# Patient Record
Sex: Male | Born: 1975 | Race: White | Hispanic: No | Marital: Single | Smoking: Current every day smoker
Health system: Southern US, Community
[De-identification: ages and names within clinical notes are randomized; demographics above are authoritative.]

## PROBLEM LIST (undated history)

## (undated) DIAGNOSIS — F319 Bipolar disorder, unspecified: Secondary | ICD-10-CM

## (undated) DIAGNOSIS — E669 Obesity, unspecified: Secondary | ICD-10-CM

## (undated) DIAGNOSIS — G629 Polyneuropathy, unspecified: Secondary | ICD-10-CM

## (undated) DIAGNOSIS — F603 Borderline personality disorder: Secondary | ICD-10-CM

## (undated) DIAGNOSIS — Z7289 Other problems related to lifestyle: Secondary | ICD-10-CM

## (undated) DIAGNOSIS — M5136 Other intervertebral disc degeneration, lumbar region: Secondary | ICD-10-CM

## (undated) DIAGNOSIS — J449 Chronic obstructive pulmonary disease, unspecified: Secondary | ICD-10-CM

## (undated) DIAGNOSIS — K219 Gastro-esophageal reflux disease without esophagitis: Secondary | ICD-10-CM

## (undated) DIAGNOSIS — F329 Major depressive disorder, single episode, unspecified: Secondary | ICD-10-CM

## (undated) DIAGNOSIS — K0889 Other specified disorders of teeth and supporting structures: Secondary | ICD-10-CM

## (undated) DIAGNOSIS — M869 Osteomyelitis, unspecified: Secondary | ICD-10-CM

## (undated) DIAGNOSIS — L97519 Non-pressure chronic ulcer of other part of right foot with unspecified severity: Secondary | ICD-10-CM

## (undated) DIAGNOSIS — F410 Panic disorder [episodic paroxysmal anxiety] without agoraphobia: Secondary | ICD-10-CM

## (undated) DIAGNOSIS — M51369 Other intervertebral disc degeneration, lumbar region without mention of lumbar back pain or lower extremity pain: Secondary | ICD-10-CM

## (undated) DIAGNOSIS — Z87898 Personal history of other specified conditions: Secondary | ICD-10-CM

## (undated) DIAGNOSIS — M1990 Unspecified osteoarthritis, unspecified site: Secondary | ICD-10-CM

## (undated) DIAGNOSIS — L98499 Non-pressure chronic ulcer of skin of other sites with unspecified severity: Secondary | ICD-10-CM

## (undated) DIAGNOSIS — F32A Depression, unspecified: Secondary | ICD-10-CM

## (undated) HISTORY — PX: AMPUTATION: SHX166

## (undated) HISTORY — PX: INNER EAR SURGERY: SHX679

---

## 2000-12-07 ENCOUNTER — Emergency Department (HOSPITAL_COMMUNITY): Admission: EM | Admit: 2000-12-07 | Discharge: 2000-12-07 | Payer: Self-pay | Admitting: Emergency Medicine

## 2000-12-09 ENCOUNTER — Emergency Department (HOSPITAL_COMMUNITY): Admission: EM | Admit: 2000-12-09 | Discharge: 2000-12-09 | Payer: Self-pay | Admitting: Emergency Medicine

## 2002-05-03 ENCOUNTER — Encounter: Payer: Self-pay | Admitting: Emergency Medicine

## 2002-05-03 ENCOUNTER — Emergency Department (HOSPITAL_COMMUNITY): Admission: EM | Admit: 2002-05-03 | Discharge: 2002-05-03 | Payer: Self-pay | Admitting: Emergency Medicine

## 2004-04-12 ENCOUNTER — Emergency Department (HOSPITAL_COMMUNITY): Admission: EM | Admit: 2004-04-12 | Discharge: 2004-04-12 | Payer: Self-pay | Admitting: Emergency Medicine

## 2005-12-01 ENCOUNTER — Emergency Department (HOSPITAL_COMMUNITY): Admission: EM | Admit: 2005-12-01 | Discharge: 2005-12-01 | Payer: Self-pay | Admitting: *Deleted

## 2006-11-20 ENCOUNTER — Emergency Department (HOSPITAL_COMMUNITY): Admission: EM | Admit: 2006-11-20 | Discharge: 2006-11-20 | Payer: Self-pay | Admitting: Emergency Medicine

## 2007-04-15 ENCOUNTER — Emergency Department (HOSPITAL_COMMUNITY): Admission: EM | Admit: 2007-04-15 | Discharge: 2007-04-15 | Payer: Self-pay | Admitting: Emergency Medicine

## 2007-11-07 ENCOUNTER — Ambulatory Visit (HOSPITAL_COMMUNITY): Admission: RE | Admit: 2007-11-07 | Discharge: 2007-11-07 | Payer: Self-pay | Admitting: Family Medicine

## 2008-08-03 ENCOUNTER — Emergency Department (HOSPITAL_COMMUNITY): Admission: EM | Admit: 2008-08-03 | Discharge: 2008-08-03 | Payer: Self-pay | Admitting: Emergency Medicine

## 2008-08-09 ENCOUNTER — Ambulatory Visit (HOSPITAL_COMMUNITY): Admission: RE | Admit: 2008-08-09 | Discharge: 2008-08-09 | Payer: Self-pay | Admitting: Emergency Medicine

## 2008-08-30 ENCOUNTER — Inpatient Hospital Stay (HOSPITAL_COMMUNITY): Admission: EM | Admit: 2008-08-30 | Discharge: 2008-08-31 | Payer: Self-pay | Admitting: Emergency Medicine

## 2009-04-12 ENCOUNTER — Inpatient Hospital Stay (HOSPITAL_COMMUNITY): Admission: EM | Admit: 2009-04-12 | Discharge: 2009-04-14 | Payer: Self-pay | Admitting: Emergency Medicine

## 2009-04-13 ENCOUNTER — Encounter (INDEPENDENT_AMBULATORY_CARE_PROVIDER_SITE_OTHER): Payer: Self-pay | Admitting: General Surgery

## 2011-01-15 ENCOUNTER — Encounter: Payer: Self-pay | Admitting: Emergency Medicine

## 2011-04-04 LAB — CULTURE, BLOOD (ROUTINE X 2)
Culture: NO GROWTH
Report Status: 4252010
Report Status: 4252010

## 2011-04-04 LAB — DIFFERENTIAL
Basophils Absolute: 0 10*3/uL (ref 0.0–0.1)
Basophils Absolute: 0.1 10*3/uL (ref 0.0–0.1)
Basophils Absolute: 0.1 10*3/uL (ref 0.0–0.1)
Basophils Relative: 0 % (ref 0–1)
Basophils Relative: 1 % (ref 0–1)
Eosinophils Absolute: 0.2 10*3/uL (ref 0.0–0.7)
Eosinophils Absolute: 0.2 10*3/uL (ref 0.0–0.7)
Eosinophils Relative: 1 % (ref 0–5)
Eosinophils Relative: 2 % (ref 0–5)
Eosinophils Relative: 3 % (ref 0–5)
Lymphocytes Relative: 24 % (ref 12–46)
Lymphocytes Relative: 25 % (ref 12–46)
Lymphocytes Relative: 31 % (ref 12–46)
Lymphs Abs: 3 10*3/uL (ref 0.7–4.0)
Lymphs Abs: 3.2 10*3/uL (ref 0.7–4.0)
Lymphs Abs: 3.3 10*3/uL (ref 0.7–4.0)
Monocytes Absolute: 2 10*3/uL — ABNORMAL HIGH (ref 0.1–1.0)
Monocytes Absolute: 2 10*3/uL — ABNORMAL HIGH (ref 0.1–1.0)
Monocytes Relative: 15 % — ABNORMAL HIGH (ref 3–12)
Monocytes Relative: 16 % — ABNORMAL HIGH (ref 3–12)
Neutro Abs: 5.8 10*3/uL (ref 1.7–7.7)
Neutro Abs: 7.5 10*3/uL (ref 1.7–7.7)
Neutro Abs: 7.8 10*3/uL — ABNORMAL HIGH (ref 1.7–7.7)
Neutrophils Relative %: 57 % (ref 43–77)
Neutrophils Relative %: 58 % (ref 43–77)
Neutrophils Relative %: 59 % (ref 43–77)

## 2011-04-04 LAB — BASIC METABOLIC PANEL
BUN: 7 mg/dL (ref 6–23)
BUN: 7 mg/dL (ref 6–23)
CO2: 26 mEq/L (ref 19–32)
CO2: 26 mEq/L (ref 19–32)
Calcium: 9 mg/dL (ref 8.4–10.5)
Calcium: 9 mg/dL (ref 8.4–10.5)
Chloride: 102 mEq/L (ref 96–112)
Chloride: 105 mEq/L (ref 96–112)
Creatinine, Ser: 0.78 mg/dL (ref 0.4–1.5)
Creatinine, Ser: 0.85 mg/dL (ref 0.4–1.5)
GFR calc Af Amer: >60 mL/min (ref >60)
GFR calc Af Amer: >60 mL/min (ref >60)
GFR calc non Af Amer: >60 mL/min (ref >60)
GFR calc non Af Amer: >60 mL/min (ref >60)
Glucose, Bld: 86 mg/dL (ref 70–99)
Glucose, Bld: 94 mg/dL (ref 70–99)
Potassium: 4.1 mEq/L (ref 3.5–5.1)
Potassium: 4.5 mEq/L (ref 3.5–5.1)
Sodium: 138 mEq/L (ref 135–145)
Sodium: 140 mEq/L (ref 135–145)

## 2011-04-04 LAB — WOUND CULTURE

## 2011-04-04 LAB — CBC
HCT: 43.7 % (ref 39.0–52.0)
HCT: 44.9 % (ref 39.0–52.0)
Hemoglobin: 15.1 g/dL (ref 13.0–17.0)
Hemoglobin: 15.5 g/dL (ref 13.0–17.0)
MCHC: 34.6 g/dL (ref 30.0–36.0)
MCHC: 34.6 g/dL (ref 30.0–36.0)
MCV: 88.5 fL (ref 78.0–100.0)
MCV: 88.6 fL (ref 78.0–100.0)
Platelets: 210 10*3/uL (ref 150–400)
Platelets: 215 10*3/uL (ref 150–400)
Platelets: 219 10*3/uL (ref 150–400)
RBC: 4.93 MIL/uL (ref 4.22–5.81)
RBC: 5.07 MIL/uL (ref 4.22–5.81)
RDW: 13.2 % (ref 11.5–15.5)
RDW: 13.4 % (ref 11.5–15.5)
RDW: 13.5 % (ref 11.5–15.5)
WBC: 10.2 10*3/uL (ref 4.0–10.5)
WBC: 12.9 10*3/uL — ABNORMAL HIGH (ref 4.0–10.5)
WBC: 13.4 10*3/uL — ABNORMAL HIGH (ref 4.0–10.5)

## 2011-05-08 NOTE — H&P (Signed)
Hunter Reyes, Hunter Reyes             ACCOUNT NO.:  [id number]   MEDICAL RECORD NO.:  [id number]          PATIENT TYPE:  INP   LOCATION:  [id number]                          FACILITY:  [facility]   PHYSICIAN:  Anselmo Pickler, DO    DATE OF BIRTH:  1976/02/06   DATE OF ADMISSION:  08/30/2008  DATE OF DISCHARGE:  LH                              HISTORY & PHYSICAL   PRIMARY CARE PHYSICIAN:  [facility].   CHIEF COMPLAINT:  Breast abscess.   HISTORY OF PRESENT ILLNESS:  The patient is a 35 year old Caucasian male  who presented to [facility] about 5 weeks ago with an abscess that he  found on his right breast.  He had an ultrasound done by the radiologist  on August 17 in which they drew some fluid off of the ultrasound in  which they found a 2 x 3 x 2 cm abscess in the right subareolar region  with subsequent ultrasound guided aspiration, but no apparent  complications.  The patient was then sent home on Keflex for 14 days and  recommended to follow up in 2 weeks.  He was recommended for an  ultrasound in 2 weeks at that point in time.  The patient stated that he  was started originally on doxycycline before Dr. Camila Li actually changed  his medication.  He stated that he did have some resolution for the last  few weeks, but it began to worsen about 4 days ago where he started to  have more tenderness and redness of this abscess and also started having  fatigue, chills and malaise.   PAST MEDICAL HISTORY:  Significant for chronic back pain and depression.   SOCIAL HISTORY:  He is an occasional drinker, smoker and does chewing  tobacco.   ALLERGIES:  He has no known drug allergies.   MEDICATIONS:  1. Abilify 5 mg at night.  2. Celexa 40 mg at night.  3. Meloxicam 50 mg twice a day.  4. Tramadol acetaminophen 50 mg t.i.d.   REVIEW OF SYSTEMS:  Constitutional:  Positive for fever, chills and  fatigue.  EYES:  Negative for eye pain or changes in vision.  EARS,  NOSE, MOUTH  AND THROAT: All negative.  CARDIOVASCULAR:  Negative for  chest pain or palpitations as per negative shortness of breath or  wheezing.  GI:  Negative for abdominal pain.  No nausea, vomiting,  diarrhea or constipation.  GU:  Negative for hesitancy or dysuria.  MUSCULOSKELETAL:  Negative for  arthralgias, arthritis.  SKIN:  Positive for abscesses of the right  breast.  NEUROLOGICAL:  Negative for syncopal episodes.  PSYCHIATRIC:  Positive for depression.  METABOLIC:  Negative.  HEMATOLOGIC:  All  negative.   PHYSICAL EXAMINATION:  VITAL SIGNS:  Temperature 97.6, pulse 71,  respirations 20, blood pressure 103/58.  GENERAL:  The patient is an obese, well-nourished, well-hydrated 7-year-  old Caucasian male.  Head is normocephalic, atraumatic.  Eyes are EOMI.  Conjunctive there is no scleral icterus or conjunctival injection.  Ears, Nose, Mouth and Throat:  Nose is turbinates are moist.  Throat is  clear.  Normal voice.  There is no erythema noted.  NECK:  Supple.  No lymphadenopathy noted.  CARDIOVASCULAR:  Regular rate and rhythm.  No murmurs, rubs or gallops.  RESPIRATORY:  Positive expiratory rhonchi noted intermittently  throughout.  CHEST:  The patient has a moderate to large abscess cellulitis in the  area of the areola of the right breast.  There is no drainage at this  point in time.  It is tender.  ABDOMEN:  Obese, soft, nontender, nondistended.  Bowel sounds present  all for quadrants.  EXTREMITIES:  Full range of motion.  Cranial nerves II-XII grossly  intact.  NEUROLOGICAL:  Patient is A&O x3.  LYMPH NODES:  No palpable or lymph nodes noted in the axillary region,   LABORATORY DATA:  Labs that were done include white count 15.7,  hemoglobin 15.7, hematocrit 45.7, platelet count of 264.  Sodium 135,  potassium 4.0, chloride 101, CO2 28, glucose 85, BUN 8 and creatinine  0.87, blood cultures were obtained as well, and there is no record of  any fluid that was obtained from  his previous admission.   ASSESSMENT/PLAN:  Abscess of the right breast.  Will admit the patient  to the service of [facility].  Will start him on vancomycin 1 gram every  12 hours.  Also, will place him on a regular diet.  Will do DVT and GI  prophylaxis.  Will have surgery come and see him as well and we will  obtain an ultrasound since it was recommended for repeat ultrasound of  right breast.  Will go ahead and get that while he is here as well.  Will place the patient on pain medication and continue to monitor him.  Will also have case management come see him for possible IV home  therapy.      Anselmo Pickler, DO  Electronically Signed     CB/MEDQ  D:  08/30/2008  T:  08/31/2008  Job:  [id number]

## 2011-05-08 NOTE — Discharge Summary (Signed)
Hunter Reyes, Hunter Reyes             ACCOUNT NO.:  [id number]   MEDICAL RECORD NO.:  [id number]          PATIENT TYPE:  INP   LOCATION:  A312                          FACILITY:  [facility]   PHYSICIAN:  Salem Caster, DO    DATE OF BIRTH:  [birth year]   DATE OF ADMISSION:  08/30/2008  DATE OF DISCHARGE:  09/08/2009LH                               DISCHARGE SUMMARY   DISCHARGE DIAGNOSES:  1. Breast abscess.  2. Leukocytosis.  3. History of chronic back pain.  4. History of depression.   PRIMARY CARE PHYSICIAN:  [facility].  General  surgeon was Dr. Jamesetta So.   BRIEF HOSPITAL COURSE:  This is a 35 year old Caucasian male who  presented to [facility] 5 weeks prior for abscess on his right breast.  He had ultrasound performed, in which they drew off a fluid.  An  ultrasound showed 2- x 3- x 2-cm abscess in his right subareolar region.  Subsequent ultrasound was uncomplicated.  The patient was sent home on  antibiotic, and antibiotic was switched to Keflex apparently.  The  patient was told to follow up in approximately 2 weeks.  The patient was  originally on doxycycline and this medication was changed to Keflex.  The patient did have some resolution for a few weeks, began worsening,  and approximately 4 days prior he had more tenderness and redness.  He  started to have fatigue, chills, and malaise.  The patient presented  here to the hospital with these complaints.  He was admitted and he was  started on IV antibiotics.  The patient placed on DVT as well as GI  prophylaxis.  Surgery was consulted and they performed some drainage on  his abscess, which seemed to improve his abscess at this time.  He has  decreased redness and swelling, and the patient is suggesting that he  wants to go home.  We spoke with Surgery and General Surgery and states  that the patient can go home on p.o. antibiotics with followup in 1 week  at his office.   MEDICATIONS ON  DISCHARGE INCLUDE:  1. Abilify 5 mg nightly.  2. Celexa 40 mg nightly.  3. Meloxicam 15 mg twice a day.  4. Tramadol and acetaminophen 50 mg 3 times a day.  5. Bactrim DS 1 tab p.o. twice a day for 10 days.   LABS ON DISCHARGE:  So far, his glucose is unremarkable.  Sodium 138,  potassium 4.1, chloride 105, CO2 29, glucose 94, BUN 8, and creatinine  0.93.  White count is 14,900, hemoglobin 14.3, hematocrit 41.5, and  platelet count is 238,000.  The patient did have a mammogram on August 09, 2008, that showed a 2- x 3- x 2-cm abscess in the right subareolar  region with subsequent ultrasound-guided aspiration.   VITALS ON DISCHARGE:  Temperature is 98.6, pulse 80, respirations 20,  and blood pressure 117/56.   CONDITION ON DISCHARGE:  Stable.   DISPOSITION:  The patient will be discharged to home.   DISCHARGE INSTRUCTIONS:  The patient is to resume his regular diet.  The  patient is to clean his abscess area with soap and water 2-3 times  daily.  The patient is to increase his activity to regular activities  slowly.  The patient is to follow up with Dr. Arnoldo Morale on September 07, 2008, phone number and address will be given to the patient.  The  patient is to follow up with the [facility] in approximately 1-2  weeks or so.  The patient will return to the emergency room if he has  any increasing swelling or pain.      Salem Caster, DO  Electronically Signed     SM/MEDQ  D:  08/31/2008  T:  09/01/2008  Job:  [id number]

## 2011-05-08 NOTE — Discharge Summary (Signed)
NAMEJEMARCUS, Hunter Reyes             ACCOUNT NO.:  [id number]   MEDICAL RECORD NO.:  [id number]          PATIENT TYPE:  INP   LOCATION:  A308                          FACILITY:  [facility]   PHYSICIAN:  Chelsea Primus, MD      DATE OF BIRTH:  [birth year]   DATE OF ADMISSION:  04/12/2009  DATE OF DISCHARGE:  04/22/2010LH                               DISCHARGE SUMMARY   ADMISSION DIAGNOSES:  Right breast abscess.   DISCHARGE DIAGNOSES:  1. Right breast abscess.  2. Obesity vein.   ADMITTING SURGEON:  Dr. Chelsea Primus   DISPOSITION:  Home.   BRIEF HISTORY AND PHYSICAL:  Please see the admission history and  physical for complete H and P.  The patient is a 36 year old male who  presented to [facility] with increasing right chest wall pain  with associated erythema.  The patient had a history of a prior right  breast abscess just medial to the right nipple.  This required drainage  at the bedside on previous admission in September by my partner, Dr.  Arnoldo Morale.  Since that time, the patient has noted that he has always an  area of fullness or thickening in this area.  So, approximately 3 days  ago, the patient started noticing increasing pain in this area with  overlying erythema and redness.  The patient was noted to have an  abscess and was admitted for planned management and intervention.   HOSPITAL COURSE:  The patient was admitted on 04/12/2009.  He was  started on IV antibiotics with vancomycin and pain control.  Incision  and drainage was performed on the abscess which had already started to  drain.  Increased adequate drainage from this area.  In addition, a  small tissue biopsy of the wall of the abscess was excised and sent for  permanent pathology for additional evaluation.  On April 14, 2009, the  patient's white blood cell count returned as normal.  Initial pathology:  His initial cultures was suspicious for gram-positive cocci.  The  patient's pain has improved and  plans were made for discharge to home.   DISCHARGE INSTRUCTIONS:  The patient was instructed to resume a regular  diet and normal activities.  He was instructed on wound care with  washing the area with soap and water, and keeping a dressing on this  area as needed as long as it is draining.  He may resume all normal  activities.  He is to return to see me in the office in 1 week.   DISCHARGE MEDICATIONS:  1. Lortab 5/500 one or two p.o. q.4 h. p.r.n. pain.  2. Bactrim double strength 1 tablets b.i.d. x 10 days.      Chelsea Primus, MD  Electronically Signed     BZ/MEDQ  D:  04/14/2009  T:  04/14/2009  Job:  [id number]   cc:   Pole Ojea  Fax: [phone]

## 2011-09-26 LAB — CBC
Hemoglobin: 15.7
MCHC: 34.4
MCV: 88.3
Platelets: 238
RBC: 5.17
RDW: 12.8
WBC: 15.7 — ABNORMAL HIGH

## 2011-09-26 LAB — DIFFERENTIAL
Basophils Absolute: 0.1
Basophils Relative: 1
Eosinophils Absolute: 0.2
Lymphs Abs: 3.4
Monocytes Absolute: 2.6 — ABNORMAL HIGH
Monocytes Relative: 16 — ABNORMAL HIGH
Neutro Abs: 9.1 — ABNORMAL HIGH
Neutro Abs: 9.4 — ABNORMAL HIGH
Neutrophils Relative %: 60
Neutrophils Relative %: 61

## 2011-09-26 LAB — WOUND CULTURE

## 2011-09-26 LAB — CULTURE, BLOOD (ROUTINE X 2)
Culture: NO GROWTH
Culture: NO GROWTH

## 2011-09-26 LAB — BASIC METABOLIC PANEL
CO2: 28
CO2: 29
Calcium: 8.5
Chloride: 101
Creatinine, Ser: 0.87
Creatinine, Ser: 0.93
GFR calc Af Amer: >60
Glucose, Bld: 94
Potassium: 4
Sodium: 135

## 2012-12-24 DIAGNOSIS — M869 Osteomyelitis, unspecified: Secondary | ICD-10-CM

## 2012-12-24 HISTORY — DX: Osteomyelitis, unspecified: M86.9

## 2013-04-27 ENCOUNTER — Ambulatory Visit (HOSPITAL_COMMUNITY)
Admission: RE | Admit: 2013-04-27 | Discharge: 2013-04-27 | Disposition: A | Discharge status: Home / Self Care | Payer: Medicaid Other | Source: Ambulatory Visit | Attending: Physician Assistant | Admitting: Physician Assistant

## 2013-04-27 ENCOUNTER — Other Ambulatory Visit (HOSPITAL_COMMUNITY): Payer: Self-pay | Admitting: Physician Assistant

## 2013-04-27 DIAGNOSIS — R0602 Shortness of breath: Secondary | ICD-10-CM | POA: Insufficient documentation

## 2013-04-27 DIAGNOSIS — J449 Chronic obstructive pulmonary disease, unspecified: Secondary | ICD-10-CM | POA: Insufficient documentation

## 2013-04-27 DIAGNOSIS — M869 Osteomyelitis, unspecified: Secondary | ICD-10-CM

## 2013-04-27 DIAGNOSIS — L98499 Non-pressure chronic ulcer of skin of other sites with unspecified severity: Secondary | ICD-10-CM

## 2013-04-27 DIAGNOSIS — J4489 Other specified chronic obstructive pulmonary disease: Secondary | ICD-10-CM | POA: Insufficient documentation

## 2013-04-29 ENCOUNTER — Inpatient Hospital Stay (HOSPITAL_COMMUNITY)
Admission: EM | Admit: 2013-04-29 | Discharge: 2013-05-02 | DRG: 540 | Disposition: A | Discharge status: Home / Self Care | Payer: Medicaid Other | Attending: Internal Medicine | Admitting: Internal Medicine

## 2013-04-29 ENCOUNTER — Encounter (HOSPITAL_COMMUNITY): Payer: Self-pay

## 2013-04-29 DIAGNOSIS — J449 Chronic obstructive pulmonary disease, unspecified: Secondary | ICD-10-CM | POA: Diagnosis present

## 2013-04-29 DIAGNOSIS — F410 Panic disorder [episodic paroxysmal anxiety] without agoraphobia: Secondary | ICD-10-CM | POA: Diagnosis present

## 2013-04-29 DIAGNOSIS — L97522 Non-pressure chronic ulcer of other part of left foot with fat layer exposed: Secondary | ICD-10-CM

## 2013-04-29 DIAGNOSIS — M21619 Bunion of unspecified foot: Secondary | ICD-10-CM | POA: Diagnosis present

## 2013-04-29 DIAGNOSIS — J4489 Other specified chronic obstructive pulmonary disease: Secondary | ICD-10-CM | POA: Diagnosis present

## 2013-04-29 DIAGNOSIS — L97509 Non-pressure chronic ulcer of other part of unspecified foot with unspecified severity: Secondary | ICD-10-CM | POA: Diagnosis present

## 2013-04-29 DIAGNOSIS — M205X9 Other deformities of toe(s) (acquired), unspecified foot: Secondary | ICD-10-CM | POA: Diagnosis present

## 2013-04-29 DIAGNOSIS — R634 Abnormal weight loss: Secondary | ICD-10-CM

## 2013-04-29 DIAGNOSIS — F17200 Nicotine dependence, unspecified, uncomplicated: Secondary | ICD-10-CM | POA: Diagnosis present

## 2013-04-29 DIAGNOSIS — S92301A Fracture of unspecified metatarsal bone(s), right foot, initial encounter for closed fracture: Secondary | ICD-10-CM

## 2013-04-29 DIAGNOSIS — Z6832 Body mass index (BMI) 32.0-32.9, adult: Secondary | ICD-10-CM

## 2013-04-29 DIAGNOSIS — M869 Osteomyelitis, unspecified: Secondary | ICD-10-CM

## 2013-04-29 DIAGNOSIS — Z720 Tobacco use: Secondary | ICD-10-CM

## 2013-04-29 DIAGNOSIS — F489 Nonpsychotic mental disorder, unspecified: Secondary | ICD-10-CM | POA: Diagnosis present

## 2013-04-29 DIAGNOSIS — F3130 Bipolar disorder, current episode depressed, mild or moderate severity, unspecified: Secondary | ICD-10-CM | POA: Diagnosis present

## 2013-04-29 DIAGNOSIS — E669 Obesity, unspecified: Secondary | ICD-10-CM | POA: Diagnosis present

## 2013-04-29 DIAGNOSIS — M51379 Other intervertebral disc degeneration, lumbosacral region without mention of lumbar back pain or lower extremity pain: Secondary | ICD-10-CM | POA: Diagnosis present

## 2013-04-29 DIAGNOSIS — M8448XA Pathological fracture, other site, initial encounter for fracture: Secondary | ICD-10-CM | POA: Diagnosis present

## 2013-04-29 DIAGNOSIS — M5137 Other intervertebral disc degeneration, lumbosacral region: Secondary | ICD-10-CM | POA: Diagnosis present

## 2013-04-29 DIAGNOSIS — L97503 Non-pressure chronic ulcer of other part of unspecified foot with necrosis of muscle: Secondary | ICD-10-CM

## 2013-04-29 DIAGNOSIS — M2140 Flat foot [pes planus] (acquired), unspecified foot: Secondary | ICD-10-CM | POA: Diagnosis present

## 2013-04-29 DIAGNOSIS — G609 Hereditary and idiopathic neuropathy, unspecified: Secondary | ICD-10-CM | POA: Diagnosis present

## 2013-04-29 HISTORY — DX: Bipolar disorder, unspecified: F31.9

## 2013-04-29 HISTORY — DX: Other problems related to lifestyle: Z72.89

## 2013-04-29 HISTORY — DX: Other intervertebral disc degeneration, lumbar region: M51.36

## 2013-04-29 HISTORY — DX: Obesity, unspecified: E66.9

## 2013-04-29 HISTORY — DX: Major depressive disorder, single episode, unspecified: F32.9

## 2013-04-29 HISTORY — DX: Depression, unspecified: F32.A

## 2013-04-29 HISTORY — DX: Other intervertebral disc degeneration, lumbar region without mention of lumbar back pain or lower extremity pain: M51.369

## 2013-04-29 HISTORY — DX: Panic disorder (episodic paroxysmal anxiety): F41.0

## 2013-04-29 LAB — CBC
HCT: 42 % (ref 39.0–52.0)
Hemoglobin: 14.7 g/dL (ref 13.0–17.0)
MCV: 85 fL (ref 78.0–100.0)
WBC: 11.2 10*3/uL — ABNORMAL HIGH (ref 4.0–10.5)

## 2013-04-29 LAB — CREATININE, SERUM
GFR calc Af Amer: >90 mL/min (ref >90)
GFR calc non Af Amer: >90 mL/min (ref >90)

## 2013-04-29 MED ORDER — ADULT MULTIVITAMIN W/MINERALS CH
1.0000 | ORAL_TABLET | Freq: Every day | ORAL | Status: DC
  Administered 2013-04-30 – 2013-05-02 (×3): 1 via ORAL
  Filled 2013-04-29 (×3): qty 1

## 2013-04-29 MED ORDER — VANCOMYCIN HCL IN DEXTROSE 1-5 GM/200ML-% IV SOLN
1000.0000 mg | Freq: Three times a day (TID) | INTRAVENOUS | Status: DC
  Administered 2013-04-29 – 2013-05-01 (×5): 1000 mg via INTRAVENOUS
  Filled 2013-04-29 (×15): qty 200

## 2013-04-29 MED ORDER — ALUM & MAG HYDROXIDE-SIMETH 200-200-20 MG/5ML PO SUSP: 30.0000 mL | Freq: Four times a day (QID) | ORAL | Status: DC | PRN

## 2013-04-29 MED ORDER — ENOXAPARIN SODIUM 60 MG/0.6ML ~~LOC~~ SOLN
60.0000 mg | SUBCUTANEOUS | Status: DC
  Administered 2013-04-29 – 2013-05-01 (×3): 60 mg via SUBCUTANEOUS
  Filled 2013-04-29 (×3): qty 0.6

## 2013-04-29 MED ORDER — DOCUSATE SODIUM 100 MG PO CAPS
100.0000 mg | ORAL_CAPSULE | Freq: Two times a day (BID) | ORAL | Status: DC
  Administered 2013-04-29 – 2013-05-02 (×6): 100 mg via ORAL
  Filled 2013-04-29 (×6): qty 1

## 2013-04-29 MED ORDER — OXYCODONE HCL 5 MG PO TABS
5.0000 mg | ORAL_TABLET | ORAL | Status: DC | PRN
  Administered 2013-04-29 – 2013-05-01 (×6): 5 mg via ORAL
  Filled 2013-04-29 (×7): qty 1

## 2013-04-29 MED ORDER — ONDANSETRON HCL 4 MG PO TABS: 4.0000 mg | ORAL_TABLET | Freq: Four times a day (QID) | ORAL | Status: DC | PRN

## 2013-04-29 MED ORDER — OXYCODONE-ACETAMINOPHEN 5-325 MG PO TABS
1.0000 | ORAL_TABLET | Freq: Once | ORAL | Status: AC
  Administered 2013-04-29: 1 via ORAL

## 2013-04-29 MED ORDER — POLYETHYLENE GLYCOL 3350 17 G PO PACK: 17.0000 g | PACK | Freq: Every day | ORAL | Status: DC | PRN

## 2013-04-29 MED ORDER — LEVOFLOXACIN IN D5W 750 MG/150ML IV SOLN: 750.0000 mg | INTRAVENOUS | Status: DC

## 2013-04-29 MED ORDER — ONDANSETRON HCL 4 MG/2ML IJ SOLN: 4.0000 mg | Freq: Four times a day (QID) | INTRAMUSCULAR | Status: DC | PRN

## 2013-04-29 MED ORDER — VANCOMYCIN HCL IN DEXTROSE 1-5 GM/200ML-% IV SOLN: 1000.0000 mg | Freq: Once | INTRAVENOUS | Status: DC

## 2013-04-29 MED ORDER — VANCOMYCIN HCL IN DEXTROSE 1-5 GM/200ML-% IV SOLN
INTRAVENOUS | Status: AC
  Filled 2013-04-29: qty 400

## 2013-04-29 MED ORDER — ZOLPIDEM TARTRATE 5 MG PO TABS: 5.0000 mg | ORAL_TABLET | Freq: Every evening | ORAL | Status: DC | PRN

## 2013-04-29 MED ORDER — SODIUM CHLORIDE 0.9 % IV SOLN
INTRAVENOUS | Status: DC
  Administered 2013-04-29: 100 mL via INTRAVENOUS

## 2013-04-29 MED ORDER — OXYCODONE-ACETAMINOPHEN 5-325 MG PO TABS
ORAL_TABLET | ORAL | Status: AC
  Filled 2013-04-29: qty 1

## 2013-04-29 MED ORDER — LEVOFLOXACIN IN D5W 750 MG/150ML IV SOLN
750.0000 mg | Freq: Once | INTRAVENOUS | Status: AC
  Administered 2013-04-29: 750 mg via INTRAVENOUS
  Filled 2013-04-29 (×2): qty 150

## 2013-04-29 NOTE — ED Notes (Signed)
Report called and unable to reach anyone on floor.

## 2013-04-29 NOTE — ED Provider Notes (Signed)
History  This chart was scribed for Hunter Diego, MD by Jenne Campus, ED Scribe. This patient was seen in room APA18/APA18 and the patient's care was started at 4:38 PM.  CSN: [id number]  Arrival date & time 04/29/13  1313   First MD Initiated Contact with Patient 04/29/13 1632      Chief Complaint  Patient presents with  . Wound Infection     Patient is a 37 y.o. male presenting with lower extremity pain. The history is provided by the patient. No language interpreter was used.  Foot Pain This is a chronic problem. The current episode started more than 2 days ago. The problem occurs constantly. The problem has not changed since onset.Pertinent negatives include no chest pain, no abdominal pain and no headaches. Nothing aggravates the symptoms. Nothing relieves the symptoms.   HPI Comments: Hunter Reyes is a 37 y.o. male who presents to the Emergency Department for a wound check to the bottom of bilateral feet. He reports that these wounds have been present and gradually worsening for the past 2 years with associated numbness in bilateral toes for 2 years as well. Pt states that he was seen at the [facility] free clinic 2 days ago and had xrays performed on both feet that should "an infection to the bone". He reports that he has an appointment with the [facility] on May 15th, 2014 but was told to come to the ED to be seen sooner. He denies being treated for these symptoms prior to 2 days ago . He denies any fevers, chills, nausea or emesis as associated symptoms. He has a h/o bipolar disorder and depression. Pt is a current everyday smoker and occasional alcohol user.  Past Medical History  Diagnosis Date  . Bipolar 1 disorder   . Depression   . Panic attacks   . DDD (degenerative disc disease), lumbar     and thoracic    Past Surgical History  Procedure Laterality Date  . External ear surgery      No family history on file.  History  Substance Use Topics   . Smoking status: Current Every Day Smoker  . Smokeless tobacco: Not on file  . Alcohol Use: Yes     Comment: occ  on disability due to chronic wounds and DDD    Review of Systems  Constitutional: Negative for appetite change and fatigue.  HENT: Negative for congestion, sinus pressure and ear discharge.   Eyes: Negative for discharge.  Respiratory: Negative for cough.   Cardiovascular: Negative for chest pain.  Gastrointestinal: Negative for abdominal pain and diarrhea.  Genitourinary: Negative for frequency and hematuria.  Musculoskeletal: Negative for back pain.  Skin: Positive for color change and wound. Negative for rash.  Neurological: Negative for seizures and headaches.  Psychiatric/Behavioral: Negative for hallucinations.    Allergies  Darvocet and Tramadol  Home Medications   Current Outpatient Rx  Name  Route  Sig  Dispense  Refill  . gabapentin (NEURONTIN) 300 MG capsule   Oral   Take 300 mg by mouth daily.         Marland Kitchen sulfamethoxazole-trimethoprim (BACTRIM DS) 800-160 MG per tablet   Oral   Take 1 tablet by mouth 2 (two) times daily. Started on 04/27/13           Triage Vitals: BP 113/68  Temp(Src) 98.5 F (36.9 C) (Oral)  Resp 18  Ht 6\' 2"  (1.88 m)  Wt 249 lb (112.946 kg)  BMI 31.96  kg/m2  SpO2 98%  Physical Exam  Nursing note and vitals reviewed. Constitutional: He is oriented to person, place, and time. He appears well-developed and well-nourished.  HENT:  Head: Normocephalic and atraumatic.  Eyes: Conjunctivae and EOM are normal. No scleral icterus.  Neck: Neck supple. No thyromegaly present.  Cardiovascular: Normal rate and regular rhythm.  Exam reveals no gallop and no friction rub.   No murmur heard. Pulmonary/Chest: Effort normal and breath sounds normal. No stridor. He has no wheezes. He has no rales. He exhibits no tenderness.  Abdominal: Soft. He exhibits no distension. There is no tenderness. There is no rebound.  Musculoskeletal:  Normal range of motion. He exhibits no edema.  Lymphadenopathy:    He has no cervical adenopathy.  Neurological: He is alert and oriented to person, place, and time. Coordination normal.  Skin:  Large right toe appears infected, large ulcers on the palmar aspect of bilateral feet that appear to be infected  Psychiatric: He has a normal mood and affect. His behavior is normal.    ED Course  Procedures (including critical care time)  DIAGNOSTIC STUDIES: Oxygen Saturation is 98% on room air, normal by my interpretation.    COORDINATION OF CARE: 4:45 PM-Discussed treatment plan which includes admissions, CXR, CBC panel and CMP with pt at bedside and pt agreed to plan.   Labs Reviewed  COMPREHENSIVE METABOLIC PANEL  CBC WITH DIFFERENTIAL   No results found.   No diagnosis found.    MDM      The chart was scribed for me under my direct supervision.  I personally performed the history, physical, and medical decision making and all procedures in the evaluation of this patient.Hunter Diego, MD 04/29/13 [phone]

## 2013-04-29 NOTE — ED Notes (Signed)
Pt reports had xray performed on both feet Monday.  Reports has wound to bottom of R foot.  Reports the PA from the free clinic called pt and instructed him to come to ED for further evaluation.  Pt says was told the infection had gotten into the bone.

## 2013-04-29 NOTE — ED Notes (Signed)
Attempted report and nurse unavailable to take report

## 2013-04-29 NOTE — ED Notes (Signed)
Pt presently on septra and neurontin.

## 2013-04-29 NOTE — ED Notes (Signed)
per pt able to take percocet without any problems

## 2013-04-30 ENCOUNTER — Inpatient Hospital Stay (HOSPITAL_COMMUNITY): Payer: Medicaid Other

## 2013-04-30 ENCOUNTER — Encounter (HOSPITAL_COMMUNITY): Payer: Self-pay | Admitting: Internal Medicine

## 2013-04-30 DIAGNOSIS — E669 Obesity, unspecified: Secondary | ICD-10-CM

## 2013-04-30 DIAGNOSIS — L97509 Non-pressure chronic ulcer of other part of unspecified foot with unspecified severity: Secondary | ICD-10-CM

## 2013-04-30 DIAGNOSIS — Z720 Tobacco use: Secondary | ICD-10-CM | POA: Diagnosis present

## 2013-04-30 DIAGNOSIS — M869 Osteomyelitis, unspecified: Secondary | ICD-10-CM

## 2013-04-30 DIAGNOSIS — R634 Abnormal weight loss: Secondary | ICD-10-CM

## 2013-04-30 LAB — BASIC METABOLIC PANEL
BUN: 10 mg/dL (ref 6–23)
Calcium: 8.7 mg/dL (ref 8.4–10.5)
Creatinine, Ser: 0.98 mg/dL (ref 0.50–1.35)
GFR calc Af Amer: >90 mL/min (ref >90)
GFR calc non Af Amer: >90 mL/min (ref >90)
Glucose, Bld: 93 mg/dL (ref 70–99)

## 2013-04-30 LAB — HEMOGLOBIN A1C: Hgb A1c MFr Bld: 5 % (ref <5.7)

## 2013-04-30 LAB — TSH: TSH: 1.669 u[IU]/mL (ref 0.350–4.500)

## 2013-04-30 LAB — HEPATIC FUNCTION PANEL
ALT: 17 U/L (ref 0–53)
AST: 19 U/L (ref 0–37)
Alkaline Phosphatase: 81 U/L (ref 39–117)
Bilirubin, Direct: <0.1 mg/dL (ref 0.0–0.3)

## 2013-04-30 LAB — CBC
Hemoglobin: 15.5 g/dL (ref 13.0–17.0)
MCH: 29.9 pg (ref 26.0–34.0)
MCHC: 34.8 g/dL (ref 30.0–36.0)
Platelets: 195 10*3/uL (ref 150–400)
RDW: 13.9 % (ref 11.5–15.5)

## 2013-04-30 LAB — URINALYSIS, ROUTINE W REFLEX MICROSCOPIC
Glucose, UA: NEGATIVE mg/dL
Ketones, ur: NEGATIVE mg/dL
Leukocytes, UA: NEGATIVE
pH: 6 (ref 5.0–8.0)

## 2013-04-30 MED ORDER — IOHEXOL 300 MG/ML  SOLN
50.0000 mL | Freq: Once | INTRAMUSCULAR | Status: AC | PRN
  Administered 2013-04-30: 50 mL via ORAL

## 2013-04-30 MED ORDER — MORPHINE SULFATE 2 MG/ML IJ SOLN
2.0000 mg | INTRAMUSCULAR | Status: DC | PRN
  Administered 2013-04-30 (×2): 2 mg via INTRAVENOUS
  Filled 2013-04-30 (×2): qty 1

## 2013-04-30 MED ORDER — IOHEXOL 300 MG/ML  SOLN
100.0000 mL | Freq: Once | INTRAMUSCULAR | Status: AC | PRN
  Administered 2013-04-30: 100 mL via INTRAVENOUS

## 2013-04-30 MED ORDER — NICOTINE 21 MG/24HR TD PT24
21.0000 mg | MEDICATED_PATCH | Freq: Every day | TRANSDERMAL | Status: DC
  Administered 2013-04-30 – 2013-05-01 (×3): 21 mg via TRANSDERMAL
  Filled 2013-04-30 (×3): qty 1

## 2013-04-30 MED ORDER — PIPERACILLIN-TAZOBACTAM 3.375 G IVPB
3.3750 g | Freq: Three times a day (TID) | INTRAVENOUS | Status: DC
  Administered 2013-04-30 – 2013-05-01 (×4): 3.375 g via INTRAVENOUS
  Filled 2013-04-30 (×13): qty 50

## 2013-04-30 NOTE — Progress Notes (Signed)
Utilization Review Complete  

## 2013-04-30 NOTE — H&P (Signed)
[facility] History and Physical  JAYLIEN SHIPLETT [id number] DOB: 10/06/1976 DOA: 04/29/2013  Referring physician:  PCP: Jacqualine Mau, PA-C  Specialists:   Chief Complaint: right foot pain  HPI: Hunter Reyes is a pleasant 37 y.o. male with past medical hx of COPD, bipolar, cutting, depression, DDD who presents to ED cc right foot pain. Information obtained from patient. States that 2 years ago he had callus on ball of foot on right that he used a hummus stone on. The skin fell off and a wound formed. For the past 2 years this wound has gradually worsened in spite of him cleaning it, wrapping it, soaking it and using OTC antibiotic ointment. During this time, the pain has gradually worsened and his ability to ambulate has been compromised. He denies fever, chills, nausea, diarrhea. He does report bilateral numbness and tingling. He states at times the wound would drain and have foul odor. He also developed smaller ulcer on left foot several months ago and his right toe as well. Reports going to free clinic 2 days ago and was given antibiotic and xrays were taken as well as follow up appointment with wound clinic for 05/07/13. He states the pain has increased to point he can barely bear weight so he came to ED. Work up in ED yield white count 12 and xray right foot concerning for septic arthritis. [facility] asked to admit   Review of Systems: The patient denies anorexia, fever, weight loss,, vision loss, decreased hearing, hoarseness, chest pain, syncope, dyspnea on exertion, peripheral edema, balance deficits, hemoptysis, abdominal pain, melena, hematochezia, severe indigestion/heartburn, hematuria, incontinence, genital sores, muscle weakness, suspicious skin lesions, transient blindness, unusual weight change, abnormal bleeding, enlarged lymph nodes, angioedema, and breast masses.    Past Medical History  Diagnosis Date  . Bipolar 1 disorder   . Depression   . Panic attacks   .  DDD (degenerative disc disease), lumbar     and thoracic  . Obesity   . Tobacco abuse   . Deliberate self-cutting    Past Surgical History  Procedure Laterality Date  . External ear surgery     Social History:  reports that he has been smoking.  He does not have any smokeless tobacco history on file. He reports that  drinks alcohol. He reports that he does not use illicit drugs. Lives with friends, unemployed (disabled). Independent with ADL's. Allergies  Allergen Reactions  . Darvocet (Propoxyphene-Acetaminophen)   . Tramadol     History reviewed. No pertinent family history. Mother deceased in 10"s from stroke, father deceased 47's ETOH. 3 siblings medical hx +DM, HF  Prior to Admission medications   Medication Sig Start Date End Date Taking? Authorizing Provider  gabapentin (NEURONTIN) 300 MG capsule Take 300 mg by mouth daily.   Yes Historical Provider, MD  sulfamethoxazole-trimethoprim (BACTRIM DS) 800-160 MG per tablet Take 1 tablet by mouth 2 (two) times daily. Started on 04/27/13   Yes Historical Provider, MD   Physical Exam: Filed Vitals:   04/29/13 1330 04/29/13 1709 04/29/13 2144 04/30/13 0415  BP: 113/68 115/60 107/56 114/66  Pulse:  64 61 68  Temp: 98.5 F (36.9 C)  97.8 F (36.6 C) 97.7 F (36.5 C)  TempSrc: Oral  Oral Oral  Resp: 18 20 20 20   Height: 6\' 2"  (1.88 m)     Weight: 112.946 kg (249 lb)     SpO2: 98% 97% 98% 97%     General:  Obese alert pleasant NAD  Eyes: PERRL EOMI   ENT: nose without drainage, ears clear, mouth with moist pink mucus membranes no exudate  Neck: supple no lympadenopathy  Cardiovascular: RRR No MGR no LE edema. LE skin somewhat pale and cool PPP. Little hair.   Respiratory: normal effort BS with coarse bases otherwise clear to auscultation bilaterally no wheeze  Abdomen: obese soft +BS non-tender to palpation  Skin: right great toe with with discoloration, tenderness, right second toe with discoloration and deformity and  tenderness, large ulcer right palmer aspect with dark tissue no drainage no odor, left small palmer ulcer. Decreased sensation bilaterally  Musculoskeletal: no clubbing . Joints of toes on right foot slightly deformed.   Psychiatric: cooperative pleasant  Neurologic: cranial nerve II-XII intact speech clear  Labs on Admission:  Basic Metabolic Panel:  Recent Labs Lab 04/29/13 2130 04/30/13 0536  NA  --  138  K  --  4.4  CL  --  103  CO2  --  28  GLUCOSE  --  93  BUN  --  10  CREATININE 0.94 0.98  CALCIUM  --  8.7       CBC:  Recent Labs Lab 04/29/13 2130 04/30/13 0536  WBC 11.2* 12.0*  HGB 14.7 15.5  HCT 42.0 44.5  MCV 85.0 85.7  PLT 209 195      BNP (last 3 results)  CBG:  Recent Labs Lab 04/29/13 2218  GLUCAP 104*    Radiological Exams on Admission: No results found.  EKG: Independently reviewed.   Assessment/Plan Principal Problem:   Osteomyelitis: xray right foot concerning for septic arthritis. Will admit to medical floor. Continue vanc and levaquin. Continue pain medicine.  Request wound consult. Consider MRI.  Active Problems:   Foot ulcer: see above: request wound consult     Obesity, unspecified: BMI 32, rapid weight loss of 100lb in 1 year. TSH pending. Will check liver function. Concern for myeloma.     Tobacco abuse: counseled on smoking cessation     Code Status: full Family Communication:  Disposition Plan: home when ready Time spent: 31 minutes  [facility] Pager [phone]  If 7PM-7AM, please contact night-coverage [email or web address] Password [email or web address] 04/30/2013, 10:33 AM  Attending: Patient seen and examined in above note reviewed. My main concerns with this patient are 100 pound weight loss in the last year and a nonhealing wound. There are blackened areas around the ulcer. He does not have any obvious lymphadenopathy. He does have history in his abdomen and arms, indicating rapid weight loss. He is not  clinically hyperthyroid. He does have some symptoms which could be representative of diabetes, however his blood glucose does not reflect this. Plain films of the foot suggest septic arthritis. I am concerned about melanoma. Plan: 1. CT scan of the abdomen and pelvis to look for malignancy. 2. Surgical consultation and MRI scan of the foot. I wonder whether this patient requires ulcer biopsy.

## 2013-04-30 NOTE — Progress Notes (Signed)
ANTIBIOTIC CONSULT NOTE - INITIAL  Pharmacy Consult for Vancomycin and Zosyn Indication: suspected osteomyelitis/septic arthritis  Allergies  Allergen Reactions  . Darvocet (Propoxyphene-Acetaminophen)   . Tramadol    Patient Measurements: Height: 6\' 2"  (188 cm) Weight: 249 lb (112.946 kg) IBW/kg (Calculated) : 82.2  Vital Signs: Temp: 97.7 F (36.5 C) (05/08 0415) Temp src: Oral (05/08 0415) BP: 114/66 mmHg (05/08 0415) Pulse Rate: 68 (05/08 0415) Intake/Output from previous day: 05/07 0701 - 05/08 0700 In: 735 [P.O.:480; I.V.:55; IV Piggyback:200] Out: 500 [Urine:500] Intake/Output from this shift:    Labs:  Recent Labs  04/29/13 2130 04/30/13 0536  WBC 11.2* 12.0*  HGB 14.7 15.5  PLT 209 195  CREATININE 0.94 0.98   Estimated Creatinine Clearance: 139.3 ml/min (by C-G formula based on Cr of 0.98). No results found for this basename: VANCOTROUGH, VANCOPEAK, VANCORANDOM, GENTTROUGH, GENTPEAK, GENTRANDOM, TOBRATROUGH, TOBRAPEAK, TOBRARND, AMIKACINPEAK, AMIKACINTROU, AMIKACIN,  in the last 72 hours   Microbiology: No results found for this or any previous visit (from the past 720 hour(s)).  Medical History: Past Medical History  Diagnosis Date  . Bipolar 1 disorder   . Depression   . Panic attacks   . DDD (degenerative disc disease), lumbar     and thoracic  . Obesity   . Tobacco abuse   . Deliberate self-cutting    Medications:  Scheduled:  . docusate sodium  100 mg Oral BID  . enoxaparin (LOVENOX) injection  60 mg Subcutaneous Q24H  . [COMPLETED] levofloxacin (LEVAQUIN) IV  750 mg Intravenous Once  . multivitamin with minerals  1 tablet Oral Daily  . nicotine  21 mg Transdermal QHS  . [COMPLETED] oxyCODONE-acetaminophen  1 tablet Oral Once  . piperacillin-tazobactam (ZOSYN)  IV  3.375 g Intravenous Q8H  . vancomycin  1,000 mg Intravenous Q8H  . [DISCONTINUED] levofloxacin (LEVAQUIN) IV  750 mg Intravenous Q24H  . [DISCONTINUED] vancomycin  1,000 mg  Intravenous Once   Assessment: 37yo obese male with good renal fxn.  Pt admitted for wound on right foot and suspected osteomyelitis and septic arthritis.  Estimated Creatinine Clearance: 139.3 ml/min (by C-G formula based on Cr of 0.98).  Vancomycin 1gm IV q8hrs was started last night.  Zosyn will be added today.  Goal of Therapy:  Vancomycin trough level 15-20 mcg/ml  Plan:  Vancomycin 1gm IV q8hrs Check trough at steady state (tomorrow) Zosyn 3.375gm Iv q8hrs to be infused over 4 hours Monitor labs, renal fxn, and cultures Duration of therapy per MD  Hart Robinsons A 04/30/2013,10:47 AM

## 2013-05-01 LAB — CBC
Hemoglobin: 15.4 g/dL (ref 13.0–17.0)
MCH: 29.6 pg (ref 26.0–34.0)
MCHC: 34.5 g/dL (ref 30.0–36.0)
MCV: 85.8 fL (ref 78.0–100.0)
Platelets: 217 10*3/uL (ref 150–400)
RBC: 5.21 MIL/uL (ref 4.22–5.81)

## 2013-05-01 LAB — COMPREHENSIVE METABOLIC PANEL
CO2: 26 mEq/L (ref 19–32)
Calcium: 9.2 mg/dL (ref 8.4–10.5)
Creatinine, Ser: 1.01 mg/dL (ref 0.50–1.35)
GFR calc Af Amer: >90 mL/min (ref >90)
GFR calc non Af Amer: >90 mL/min (ref >90)
Glucose, Bld: 84 mg/dL (ref 70–99)

## 2013-05-01 MED ORDER — OXYCODONE HCL 5 MG PO TABS
10.0000 mg | ORAL_TABLET | ORAL | Status: DC | PRN
  Administered 2013-05-01 – 2013-05-02 (×2): 10 mg via ORAL
  Filled 2013-05-01 (×2): qty 2

## 2013-05-01 MED ORDER — PIPERACILLIN-TAZOBACTAM 3.375 G IVPB
3.3750 g | Freq: Three times a day (TID) | INTRAVENOUS | Status: DC
  Administered 2013-05-01 – 2013-05-02 (×2): 3.375 g via INTRAVENOUS
  Filled 2013-05-01 (×9): qty 50

## 2013-05-01 MED ORDER — ENSURE COMPLETE PO LIQD
237.0000 mL | Freq: Two times a day (BID) | ORAL | Status: DC
  Administered 2013-05-01 – 2013-05-02 (×2): 237 mL via ORAL

## 2013-05-01 MED ORDER — VANCOMYCIN HCL IN DEXTROSE 1-5 GM/200ML-% IV SOLN
1000.0000 mg | Freq: Three times a day (TID) | INTRAVENOUS | Status: DC
  Administered 2013-05-01 – 2013-05-02 (×3): 1000 mg via INTRAVENOUS
  Filled 2013-05-01 (×9): qty 200

## 2013-05-01 NOTE — Consult Note (Signed)
Reason for Consult: Osteomyelitis, right foot Referring Physician: [facility]  Hunter Reyes is an 37 y.o. male.  HPI: Patient is a 37 year old white male who apparently for the past 2 years has had multiple ulcers on both feet. He is not a diabetic. He states that he has known he has had sores on his feet, but did not seek any medical attention. He presented emergency room as his right foot started to hurt more.  Past Medical History  Diagnosis Date  . Bipolar 1 disorder   . Depression   . Panic attacks   . DDD (degenerative disc disease), lumbar     and thoracic  . Obesity   . Tobacco abuse   . Deliberate self-cutting     Past Surgical History  Procedure Laterality Date  . External ear surgery      History reviewed. No pertinent family history.  Social History:  reports that he has been smoking.  He does not have any smokeless tobacco history on file. He reports that  drinks alcohol. He reports that he does not use illicit drugs.  Allergies:  Allergies  Allergen Reactions  . Darvocet (Propoxyphene-Acetaminophen)   . Tramadol     Medications: I have reviewed the patient's current medications.  Results for orders placed during the hospital encounter of 04/29/13 (from the past 48 hour(s))  CBC     Status: Abnormal   Collection Time    04/29/13  9:30 PM      Result Value Range   WBC 11.2 (*) 4.0 - 10.5 K/uL   RBC 4.94  4.22 - 5.81 MIL/uL   Hemoglobin 14.7  13.0 - 17.0 g/dL   HCT 42.0  39.0 - 52.0 %   MCV 85.0  78.0 - 100.0 fL   MCH 29.8  26.0 - 34.0 pg   MCHC 35.0  30.0 - 36.0 g/dL   RDW 13.8  11.5 - 15.5 %   Platelets 209  150 - 400 K/uL  CREATININE, SERUM     Status: None   Collection Time    04/29/13  9:30 PM      Result Value Range   Creatinine, Ser 0.94  0.50 - 1.35 mg/dL   GFR calc non Af Amer >90  >90 mL/min   GFR calc Af Amer >90  >90 mL/min   Comment:            The eGFR has been calculated     using the CKD EPI equation.     This  calculation has not been     validated in all clinical     situations.     eGFR's persistently     <90 mL/min signify     possible Chronic Kidney Disease.  TSH     Status: None   Collection Time    04/29/13  9:30 PM      Result Value Range   TSH 1.669  0.350 - 4.500 uIU/mL  HEMOGLOBIN A1C     Status: None   Collection Time    04/29/13  9:30 PM      Result Value Range   Hemoglobin A1C 5.0  <5.7 %   Comment: (NOTE)  According to the ADA Clinical Practice Recommendations for 2011, when     HbA1c is used as a screening test:      >=6.5%   Diagnostic of Diabetes Mellitus               (if abnormal result is confirmed)     5.7-6.4%   Increased risk of developing Diabetes Mellitus     References:Diagnosis and Classification of Diabetes Mellitus,Diabetes     [id number] 1):S62-S69 and Standards of Medical Care in             Diabetes - 2011,Diabetes [id number] (Suppl 1):S11-S61.   Mean Plasma Glucose 97  <117 mg/dL  GLUCOSE, CAPILLARY     Status: Abnormal   Collection Time    04/29/13 10:18 PM      Result Value Range   Glucose-Capillary 104 (*) 70 - 99 mg/dL  BASIC METABOLIC PANEL     Status: None   Collection Time    04/30/13  5:36 AM      Result Value Range   Sodium 138  135 - 145 mEq/L   Potassium 4.4  3.5 - 5.1 mEq/L   Chloride 103  96 - 112 mEq/L   CO2 28  19 - 32 mEq/L   Glucose, Bld 93  70 - 99 mg/dL   BUN 10  6 - 23 mg/dL   Creatinine, Ser 0.98  0.50 - 1.35 mg/dL   Calcium 8.7  8.4 - 10.5 mg/dL   GFR calc non Af Amer >90  >90 mL/min   GFR calc Af Amer >90  >90 mL/min   Comment:            The eGFR has been calculated     using the CKD EPI equation.     This calculation has not been     validated in all clinical     situations.     eGFR's persistently     <90 mL/min signify     possible Chronic Kidney Disease.  CBC     Status: Abnormal   Collection Time    04/30/13  5:36 AM       Result Value Range   WBC 12.0 (*) 4.0 - 10.5 K/uL   RBC 5.19  4.22 - 5.81 MIL/uL   Hemoglobin 15.5  13.0 - 17.0 g/dL   HCT 44.5  39.0 - 52.0 %   MCV 85.7  78.0 - 100.0 fL   MCH 29.9  26.0 - 34.0 pg   MCHC 34.8  30.0 - 36.0 g/dL   RDW 13.9  11.5 - 15.5 %   Platelets 195  150 - 400 K/uL  HEPATIC FUNCTION PANEL     Status: Abnormal   Collection Time    04/30/13  5:36 AM      Result Value Range   Total Protein 6.6  6.0 - 8.3 g/dL   Albumin 3.4 (*) 3.5 - 5.2 g/dL   AST 19  0 - 37 U/L   ALT 17  0 - 53 U/L   Alkaline Phosphatase 81  39 - 117 U/L   Total Bilirubin 0.2 (*) 0.3 - 1.2 mg/dL   Bilirubin, Direct <0.1  0.0 - 0.3 mg/dL   Indirect Bilirubin NOT CALCULATED  0.3 - 0.9 mg/dL  URINALYSIS, ROUTINE W REFLEX MICROSCOPIC     Status: Abnormal   Collection Time    04/30/13  2:16 PM      Result Value Range   Color, Urine YELLOW  YELLOW  APPearance CLEAR  CLEAR   Specific Gravity, Urine <1.005 (*) 1.005 - 1.030   pH 6.0  5.0 - 8.0   Glucose, UA NEGATIVE  NEGATIVE mg/dL   Hgb urine dipstick TRACE (*) NEGATIVE   Bilirubin Urine NEGATIVE  NEGATIVE   Ketones, ur NEGATIVE  NEGATIVE mg/dL   Protein, ur NEGATIVE  NEGATIVE mg/dL   Urobilinogen, UA 0.2  0.0 - 1.0 mg/dL   Nitrite NEGATIVE  NEGATIVE   Leukocytes, UA NEGATIVE  NEGATIVE  URINE MICROSCOPIC-ADD ON     Status: None   Collection Time    04/30/13  2:16 PM      Result Value Range   RBC / HPF 0-2  <3 RBC/hpf  CBC     Status: Abnormal   Collection Time    05/01/13  5:26 AM      Result Value Range   WBC 11.8 (*) 4.0 - 10.5 K/uL   RBC 5.21  4.22 - 5.81 MIL/uL   Hemoglobin 15.4  13.0 - 17.0 g/dL   HCT 44.7  39.0 - 52.0 %   MCV 85.8  78.0 - 100.0 fL   MCH 29.6  26.0 - 34.0 pg   MCHC 34.5  30.0 - 36.0 g/dL   RDW 13.8  11.5 - 15.5 %   Platelets 217  150 - 400 K/uL  COMPREHENSIVE METABOLIC PANEL     Status: Abnormal   Collection Time    05/01/13  5:26 AM      Result Value Range   Sodium 138  135 - 145 mEq/L   Potassium 4.0   3.5 - 5.1 mEq/L   Chloride 101  96 - 112 mEq/L   CO2 26  19 - 32 mEq/L   Glucose, Bld 84  70 - 99 mg/dL   BUN 13  6 - 23 mg/dL   Creatinine, Ser 1.01  0.50 - 1.35 mg/dL   Calcium 9.2  8.4 - 10.5 mg/dL   Total Protein 7.2  6.0 - 8.3 g/dL   Albumin 3.7  3.5 - 5.2 g/dL   AST 18  0 - 37 U/L   ALT 17  0 - 53 U/L   Alkaline Phosphatase 89  39 - 117 U/L   Total Bilirubin 0.2 (*) 0.3 - 1.2 mg/dL   GFR calc non Af Amer >90  >90 mL/min   GFR calc Af Amer >90  >90 mL/min   Comment:            The eGFR has been calculated     using the CKD EPI equation.     This calculation has not been     validated in all clinical     situations.     eGFR's persistently     <90 mL/min signify     possible Chronic Kidney Disease.  VANCOMYCIN, TROUGH     Status: None   Collection Time    05/01/13  5:27 AM      Result Value Range   Vancomycin Tr 15.7  10.0 - 20.0 ug/mL    Ct Abdomen Pelvis W Contrast  04/30/2013  *RADIOLOGY REPORT*  Clinical Data: Rapid weight loss of 100 pounds, bipolar disorder, depression  CT ABDOMEN AND PELVIS WITH CONTRAST  Technique:  Multidetector CT imaging of the abdomen and pelvis was performed following the standard protocol during bolus administration of intravenous contrast.  Contrast:  100 ml Omnipaque-300  Comparison: None.  Findings: Lung bases clear.  Normal heart size.  No pericardial or pleural effusion.  Abdomen:  Liver, gallbladder, pancreas, spleen, adrenal glands, and kidneys are within normal limits for age and demonstrate no acute process.  No abdominal free fluid, fluid collection, hemorrhage, abscess, or adenopathy.  Negative for bowel obstruction, dilatation, ileus, or free air. Normal retrocecal appendix demonstrated.  Pelvis:  No pelvic free fluid, fluid collection, hemorrhage, abscess, or hernia.  Urinary bladder unremarkable.  No acute distal bowel process.  Mild bilateral inguinal adenopathy noted, measuring 2 cm on the right image 98 and 2.85 x 1.5 cm on the left,  image 97.  This is nonspecific and may be reactive versus lymphoproliferative process.  Diffuse degenerative changes of the spine.  No compression fracture or acute osseous finding.  IMPRESSION: No acute intra-abdominal or pelvic finding.  Nonspecific mild bilateral inguinal adenopathy.   Original Report Authenticated By: Jerilynn Mages. Annamaria Boots, M.D.    Mr Foot Right Wo Contrast  04/30/2013  *RADIOLOGY REPORT*  Clinical Data: Arthropathy of the second and third MTP joints. Chronic plantar ulcer.  MRI OF THE RIGHT FOREFOOT WITHOUT CONTRAST  Technique:  Multiplanar, multisequence MR imaging was performed. No intravenous contrast was administered.  Comparison: Radiographs of 04/27/2013  Findings: The patient refused gadolinium contrast medium.  There are transverse fractures through the heads of the second and third metatarsals, with osseous and surrounding soft tissue edema, and with lateral subluxation of the adjacent proximal phalanges with respect to the metatarsal heads.  Small plantar effusion of the second metatarsophalangeal joint noted at that may be some blistering in the plantar soft tissues underlying the second and third MTP joints.  Surrounding marrow edema is present both of these metatarsal heads and in the bases of the proximal phalanges.  There is some patchy edema in the head of the first metatarsal. There is abnormal edema throughout the distal phalanx of the great toe.  The Lisfranc ligament appears intact.  No malalignment at the Lisfranc joint.  Subtle edema proximally in the first metatarsal is observed along with subtle edema in the proximal metadiaphysis of the third and fourth metatarsals.  There is abnormal thickening of the distal medial band the plantar fascia.  First digit sesamoids intact.  Small effusion of the first MTP joint noted dorsally.  IMPRESSION:  1.  Fractures of the heads of the second and third metatarsals with extensive surrounding edema, particularly tracking to the plantar surface  where there is likely underlying ulceration and blistering. I suspect that the fracturing is accompanied by both soft tissue and osseous infection involving the distal metatarsals and potentially the bases of the proximal phalanges. 2.  Abnormal edema in the distal phalanx of the great toe, likewise suspicious for osteomyelitis.  There is patchy edema proximally and distally in the first metatarsal which is nonspecific but which could also conceivably reflect early osteomyelitis. 3.  Distal abnormal thickening of the medial band the plantar fascia, query fibrosis or injury.   Original Report Authenticated By: Van Clines, M.D.    US Arterial Seg Multiple  04/30/2013  *RADIOLOGY REPORT*  Clinical Data: Chronic bilateral foot ulcers, history smoking, bilateral rest pain  BILAT LOWER EXTREMITY ARTERIAL SEGMENTAL EVAL  Comparison: None  Findings: Following segmental pressures obtained, in mm Hg:  Right brachial:         122 Right proximal thigh:   219 Right distal thigh:           177 Right calf:             160 Right ankle PT:  131 Right ankle DP:         159 Right ABI:              1.30 Right great toe:        123  Left brachial:          112 Left proximal thigh:    186 Left distal thigh:            208 Left calf:              148 Left ankle PT:          163 Left ankle DP:          161 Left ABI:               1.34 Left great toe:         128  Waveform analysis right lower extremity: Triphasic waveforms present to the ankles.  Waveform analysis left lower extremity: Triphasic waveforms present to the ankles.  IMPRESSION: Normal bilateral ABIs with normal triphasic wave forms at both ankles.   Original Report Authenticated By: Lavonia Dana, M.D.     ROS: See chart Blood pressure 112/54, pulse 60, temperature 98.5 F (36.9 C), temperature source Oral, resp. rate 20, height 6\' 2"  (1.88 m), weight 112.946 kg (249 lb), SpO2 98.00%. Physical Exam: Pleasant white male no acute distress. Extremity:  Bilateral pedal pulses appreciated. Patient has overgrown toenails. Dry dark eschar is noted along the soles of both feet at the second to third metatarsal head. No purulent drainage is noted. There is some subcutaneous cyanotic changes noted on the dorsum of the right foot over the second and third metatarsal heads. There are various skin scratches noted on the lower extremities.  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 Overall, this patient has not been taking care of his feet appropriately. I did tell him he may end up needing partial amputation of the right foot, but patient refuses at this time, which is fine with me. This is not an urgent need. I didn't express the importance of foot care and that he may need to followup with podiatrist. He has no insurance, thus this may not be helpful. He may be a Medicaid candidate. Wound care orders have been written.  Jordyne Poehlman A 05/01/2013, 9:43 AM

## 2013-05-01 NOTE — Progress Notes (Signed)
Nutrition Brief Note  Patient identified on the Malnutrition Screening Tool (MST) Report  Body mass index is 31.96 kg/(m^2). Patient meets criteria for Obesity Class I based on current BMI. Hx of 100# wt loss over past year. Reports usual meal patterns unchanged. He eats 1-2 meals with snacks daily and denies change in appetite.   Wt Readings from Last 10 Encounters:  04/29/13 249 lb (112.946 kg)   Current diet order is Heart Healthy, patient is consuming approximately 75-100% of meals at this time. Labs and medications reviewed.   No nutrition interventions warranted at this time. If nutrition issues arise, please consult RD.   Colman Cater MS,RD,LDN,CSG Office: [phone] Pager: [phone]

## 2013-05-01 NOTE — Progress Notes (Signed)
[facility] PROGRESS NOTE  Hunter Reyes [id number] DOB: [birth year] DOA: 04/29/2013 PCP: Jacqualine Mau, PA-C  Assessment/Plan: Osteomyelitis: xray right foot concerning for septic arthritis. Continue vanc and Zosyn day #2. Continue pain medicine as needed. MRI concerning for  osteomyelitis. Will need antibiotics at discharge.  Appreciate surgical assistance.  Active Problems:  Foot ulcer: see above: Defer to surgery   Obesity, unspecified: BMI 32, rapid weight loss of 100lb in 1 year. TSH 1.66. HgA1c 5.0. Liver function unremarkable. Concern for melanoma.    Tobacco abuse: counseled on smoking cessation    Code Status: full Family Communication:  Disposition Plan: home when ready. Likely 24-48 hrs.    Consultants:  surgery  Procedures:  none  Antibiotics:  Vancomycin 04/30/13>>  Zosyn 04/30/13>>>  HPI/Subjective: Awake alert NAD  Objective: Filed Vitals:   04/30/13 0415 04/30/13 1500 04/30/13 2209 05/01/13 0516  BP: 114/66 116/73 119/69 112/54  Pulse: 68 70 63 60  Temp: 97.7 F (36.5 C) 98.4 F (36.9 C)  98.5 F (36.9 C)  TempSrc: Oral Oral  Oral  Resp: 20 20 20 20   Height:      Weight:      SpO2: 97% 98% 96% 98%    Intake/Output Summary (Last 24 hours) at 05/01/13 0850 Last data filed at 05/01/13 0013  Gross per 24 hour  Intake    360 ml  Output    550 ml  Net   -190 ml   Filed Weights   04/29/13 1330  Weight: 112.946 kg (249 lb)    Exam:   General:  Obese, alert pleasant  Cardiovascular: RRR No MGR No LEE PPP  Respiratory: normal effort BS clear bilaterally. No wheeze no rhochi  Abdomen: soft +BS non-tender to palpation  Musculoskeletal: right foot with large ulcer palmer aspect. Black eschar tissue. Dry. Left foot with small ulcer palmer aspect. Right great toe discolored, warm, tender.    Data Reviewed: Basic Metabolic Panel:  Recent Labs Lab 04/29/13 2130 04/30/13 0536 05/01/13 0526  NA  --  138 138  K  --  4.4  4.0  CL  --  103 101  CO2  --  28 26  GLUCOSE  --  93 84  BUN  --  10 13  CREATININE 0.94 0.98 1.01  CALCIUM  --  8.7 9.2   Liver Function Tests:  Recent Labs Lab 04/30/13 0536 05/01/13 0526  AST 19 18  ALT 17 17  ALKPHOS 81 89  BILITOT 0.2* 0.2*  PROT 6.6 7.2  ALBUMIN 3.4* 3.7   No results found for this basename: LIPASE, AMYLASE,  in the last 168 hours No results found for this basename: AMMONIA,  in the last 168 hours CBC:  Recent Labs Lab 04/29/13 2130 04/30/13 0536 05/01/13 0526  WBC 11.2* 12.0* 11.8*  HGB 14.7 15.5 15.4  HCT 42.0 44.5 44.7  MCV 85.0 85.7 85.8  PLT 209 195 217   Cardiac Enzymes: No results found for this basename: CKTOTAL, CKMB, CKMBINDEX, TROPONINI,  in the last 168 hours BNP (last 3 results) No results found for this basename: PROBNP,  in the last 8760 hours CBG:  Recent Labs Lab 04/29/13 2218  GLUCAP 104*    No results found for this or any previous visit (from the past 240 hour(s)).   Studies: Ct Abdomen Pelvis W Contrast  04/30/2013  *RADIOLOGY REPORT*  Clinical Data: Rapid weight loss of 100 pounds, bipolar disorder, depression  CT ABDOMEN AND PELVIS WITH CONTRAST  Technique:  Multidetector CT imaging of the abdomen and pelvis was performed following the standard protocol during bolus administration of intravenous contrast.  Contrast:  100 ml Omnipaque-300  Comparison: None.  Findings: Lung bases clear.  Normal heart size.  No pericardial or pleural effusion.  Abdomen:  Liver, gallbladder, pancreas, spleen, adrenal glands, and kidneys are within normal limits for age and demonstrate no acute process.  No abdominal free fluid, fluid collection, hemorrhage, abscess, or adenopathy.  Negative for bowel obstruction, dilatation, ileus, or free air. Normal retrocecal appendix demonstrated.  Pelvis:  No pelvic free fluid, fluid collection, hemorrhage, abscess, or hernia.  Urinary bladder unremarkable.  No acute distal bowel process.  Mild  bilateral inguinal adenopathy noted, measuring 2 cm on the right image 98 and 2.85 x 1.5 cm on the left, image 97.  This is nonspecific and may be reactive versus lymphoproliferative process.  Diffuse degenerative changes of the spine.  No compression fracture or acute osseous finding.  IMPRESSION: No acute intra-abdominal or pelvic finding.  Nonspecific mild bilateral inguinal adenopathy.   Original Report Authenticated By: Jerilynn Mages. Annamaria Boots, M.D.    Mr Foot Right Wo Contrast  04/30/2013  *RADIOLOGY REPORT*  Clinical Data: Arthropathy of the second and third MTP joints. Chronic plantar ulcer.  MRI OF THE RIGHT FOREFOOT WITHOUT CONTRAST  Technique:  Multiplanar, multisequence MR imaging was performed. No intravenous contrast was administered.  Comparison: Radiographs of 04/27/2013  Findings: The patient refused gadolinium contrast medium.  There are transverse fractures through the heads of the second and third metatarsals, with osseous and surrounding soft tissue edema, and with lateral subluxation of the adjacent proximal phalanges with respect to the metatarsal heads.  Small plantar effusion of the second metatarsophalangeal joint noted at that may be some blistering in the plantar soft tissues underlying the second and third MTP joints.  Surrounding marrow edema is present both of these metatarsal heads and in the bases of the proximal phalanges.  There is some patchy edema in the head of the first metatarsal. There is abnormal edema throughout the distal phalanx of the great toe.  The Lisfranc ligament appears intact.  No malalignment at the Lisfranc joint.  Subtle edema proximally in the first metatarsal is observed along with subtle edema in the proximal metadiaphysis of the third and fourth metatarsals.  There is abnormal thickening of the distal medial band the plantar fascia.  First digit sesamoids intact.  Small effusion of the first MTP joint noted dorsally.  IMPRESSION:  1.  Fractures of the heads of the  second and third metatarsals with extensive surrounding edema, particularly tracking to the plantar surface where there is likely underlying ulceration and blistering. I suspect that the fracturing is accompanied by both soft tissue and osseous infection involving the distal metatarsals and potentially the bases of the proximal phalanges. 2.  Abnormal edema in the distal phalanx of the great toe, likewise suspicious for osteomyelitis.  There is patchy edema proximally and distally in the first metatarsal which is nonspecific but which could also conceivably reflect early osteomyelitis. 3.  Distal abnormal thickening of the medial band the plantar fascia, query fibrosis or injury.   Original Report Authenticated By: Van Clines, M.D.    US Arterial Seg Multiple  04/30/2013  *RADIOLOGY REPORT*  Clinical Data: Chronic bilateral foot ulcers, history smoking, bilateral rest pain  BILAT LOWER EXTREMITY ARTERIAL SEGMENTAL EVAL  Comparison: None  Findings: Following segmental pressures obtained, in mm Hg:  Right brachial:  122 Right proximal thigh:   219 Right distal thigh:           177 Right calf:             160 Right ankle PT:         131 Right ankle DP:         159 Right ABI:              1.30 Right great toe:        123  Left brachial:          112 Left proximal thigh:    186 Left distal thigh:            208 Left calf:              148 Left ankle PT:          163 Left ankle DP:          161 Left ABI:               1.34 Left great toe:         128  Waveform analysis right lower extremity: Triphasic waveforms present to the ankles.  Waveform analysis left lower extremity: Triphasic waveforms present to the ankles.  IMPRESSION: Normal bilateral ABIs with normal triphasic wave forms at both ankles.   Original Report Authenticated By: Lavonia Dana, M.D.     Scheduled Meds: . docusate sodium  100 mg Oral BID  . enoxaparin (LOVENOX) injection  60 mg Subcutaneous Q24H  . multivitamin with minerals  1  tablet Oral Daily  . nicotine  21 mg Transdermal QHS  . piperacillin-tazobactam (ZOSYN)  IV  3.375 g Intravenous Q8H  . vancomycin  1,000 mg Intravenous Q8H   Continuous Infusions:   Principal Problem:   Osteomyelitis Active Problems:   Foot ulcer   Obesity, unspecified   Tobacco abuse   Loss of weight    Time spent: 30 minutes    [facility]  If 7PM-7AM, please contact night-coverage at [email or web address], password [facility] 05/01/2013, 8:50 AM  LOS: 2 days   Attending: Patient seen and examined, above no reviewed. No evidence of malignancy in the abdomen per CT scan. Dr. Arnoldo Morale does not think he has evidence clinically of melanoma. MRI of the foot shows fracture of the second and third metatarsal and osteomyelitis. Continue with intravenous antibiotics over the weekend. Orthopedic consultation also. I told the patient he may require amputation.

## 2013-05-01 NOTE — Progress Notes (Signed)
ANTIBIOTIC CONSULT NOTE   Pharmacy Consult for Vancomycin and Zosyn Indication: suspected osteomyelitis/septic arthritis  Allergies  Allergen Reactions  . Darvocet (Propoxyphene-Acetaminophen)   . Tramadol    Patient Measurements: Height: 6\' 2"  (188 cm) Weight: 249 lb (112.946 kg) IBW/kg (Calculated) : 82.2  Vital Signs: Temp: 98.5 F (36.9 C) (05/09 0516) Temp src: Oral (05/09 0516) BP: 112/54 mmHg (05/09 0516) Pulse Rate: 60 (05/09 0516) Intake/Output from previous day: 05/08 0701 - 05/09 0700 In: 600 [P.O.:600] Out: 750 [Urine:750] Intake/Output from this shift:    Labs:  Recent Labs  04/29/13 2130 04/30/13 0536 05/01/13 0526  WBC 11.2* 12.0* 11.8*  HGB 14.7 15.5 15.4  PLT 209 195 217  CREATININE 0.94 0.98 1.01   Estimated Creatinine Clearance: 135.1 ml/min (by C-G formula based on Cr of 1.01).  Recent Labs  05/01/13 0527  VANCOTROUGH 15.7     Microbiology: No results found for this or any previous visit (from the past 720 hour(s)).  Medical History: Past Medical History  Diagnosis Date  . Bipolar 1 disorder   . Depression   . Panic attacks   . DDD (degenerative disc disease), lumbar     and thoracic  . Obesity   . Tobacco abuse   . Deliberate self-cutting    Medications:  Scheduled:  . docusate sodium  100 mg Oral BID  . enoxaparin (LOVENOX) injection  60 mg Subcutaneous Q24H  . multivitamin with minerals  1 tablet Oral Daily  . nicotine  21 mg Transdermal QHS  . piperacillin-tazobactam (ZOSYN)  IV  3.375 g Intravenous Q8H  . vancomycin  1,000 mg Intravenous Q8H  . [DISCONTINUED] levofloxacin (LEVAQUIN) IV  750 mg Intravenous Q24H   Assessment: 37yo obese male with good renal fxn.  Pt admitted for wound on right foot and suspected osteomyelitis and septic arthritis.  Estimated Creatinine Clearance: 135.1 ml/min (by C-G formula based on Cr of 1.01).  Vancomycin trough level is on target.  ANTIBIOTICS: Vancomycin 5/7 >>> Zosyn 5/8  >>>  Goal of Therapy:  Vancomycin trough level 15-20 mcg/ml  Plan:  Continue Vancomycin 1gm IV q8hrs Check trough level weekly while on Vancomycin Continue Zosyn 3.375gm Iv q8hrs to be infused over 4 hours Check SCr at least twice weekly while on Vancomycin Monitor labs, renal fxn, and cultures Duration of therapy per MD  Hart Robinsons A 05/01/2013,7:52 AM

## 2013-05-01 NOTE — Consult Note (Signed)
WOC consulted, however pending surgical evaluation and MRI.  When I contacted the bedside nurse pt. Heading for MRI to R/O osteomyelitis. For this reason will not consult at this time.   Surgical group to determine best course of action, osteomylitis tx. Is outside the scope of practice for the [facility] nurse.     Re consult if needed, will not follow at this time. Thanks  Britney Newstrom [place], [place] [phone])

## 2013-05-02 ENCOUNTER — Encounter (HOSPITAL_COMMUNITY): Payer: Self-pay | Admitting: Orthopedic Surgery

## 2013-05-02 DIAGNOSIS — S92309A Fracture of unspecified metatarsal bone(s), unspecified foot, initial encounter for closed fracture: Secondary | ICD-10-CM

## 2013-05-02 MED ORDER — OXYCODONE HCL 10 MG PO TABS: 10.0000 mg | ORAL_TABLET | ORAL | Status: DC | PRN

## 2013-05-02 NOTE — Progress Notes (Addendum)
Pt and his family verbalize understanding of d/c instructions, medications, and follow up care to be received at the wound clinic in [place]. Pt states that Dr. Aline Brochure was faxing his information to the clinic who will then refer him to a specialist to review his case. IV d/c. Pt complained of IV site feeling a little bit tight, warm pack given. CAM walker applied to right foot. Prescription given to patient. I informed patient of the importance of washing feet well with soap and water, pt and his friend verbalize understanding. I d/c pt via wheelchair, accompanied by his family that was at the bedside. Marry Guan, RN

## 2013-05-02 NOTE — Plan of Care (Signed)
Problem: Consults Goal: Nutrition Consult-if indicated Outcome: Completed/Met Date Met:  05/02/13 Dietician has evaluated pt.

## 2013-05-02 NOTE — Discharge Summary (Signed)
Physician Discharge Summary  Hunter Reyes [id number] DOB: 1976/02/22 DOA: 04/29/2013  PCP: Jacqualine Mau, PA-C  Admit date: 04/29/2013 Discharge date: 05/02/2013  Time spent: Greater than 30 minutes  Recommendations for Outpatient Follow-up:  1. Follow up with wound clinic and possible other specialty referral.  Discharge Diagnoses:  1. Right foot ulcer, nonhealing, chronic. 2. Osteomyelitis in the right foot with chronic fractures. 3. Obesity. 4. Tobacco abuse. 5. Bipolar disorder.   Discharge Condition: Stable.  Diet recommendation: Regular.  Filed Weights   04/29/13 1330  Weight: 112.946 kg (249 lb)    History of present illness:  This 37 year old man presented to the hospital with symptoms of right foot pain. Please see initial history as outlined below: HPI: Hunter Reyes is a pleasant 37 y.o. male with past medical hx of COPD, bipolar, cutting, depression, DDD who presents to ED cc right foot pain. Information obtained from patient. States that 2 years ago he had callus on ball of foot on right that he used a hummus stone on. The skin fell off and a wound formed. For the past 2 years this wound has gradually worsened in spite of him cleaning it, wrapping it, soaking it and using OTC antibiotic ointment. During this time, the pain has gradually worsened and his ability to ambulate has been compromised. He denies fever, chills, nausea, diarrhea. He does report bilateral numbness and tingling. He states at times the wound would drain and have foul odor. He also developed smaller ulcer on left foot several months ago and his right toe as well. Reports going to free clinic 2 days ago and was given antibiotic and xrays were taken as well as follow up appointment with wound clinic for 05/07/13. He states the pain has increased to point he can barely bear weight so he came to ED. Work up in ED yield white count 12 and xray right foot concerning for septic arthritis. [facility] asked  to admit  Hospital Course:  The patient was admitted and started on empirical intravenous antibiotics. He was reviewed by Dr. Aviva Signs, surgery, who felt that he was not a surgical candidate acutely. In fact the foot ulcer is  not amenable to debridement at this point. There is no clinical evidence of anything more sinister such as melanoma. He did have a history of 100 pound weight loss in the last year and that was somewhat concerning. A CT scan of the abdomen was done which did not show any evidence of malignancy. He has been very stable during hospitalization, no fever, white blood cell count mildly elevated. He is not septic or toxic. He does have an appointment with the wound clinic in 5 days time and he will followup with this. At this point in time he may need referral for other specialists such as orthopedics. He may actually need amputation of part of the foot.  Procedures:  None.   Consultations:  Surgery, Dr. Arnoldo Morale.  Discharge Exam: Filed Vitals:   05/01/13 1357 05/01/13 2114 05/02/13 0301 05/02/13 0617  BP: 118/58 105/58 106/72 108/69  Pulse: 73 68 87 62  Temp: 97.8 F (36.6 C) 98.1 F (36.7 C) 98.1 F (36.7 C) 98.1 F (36.7 C)  TempSrc:  Oral Oral Oral  Resp: 18 20 20 20   Height:      Weight:      SpO2: 97% 99% 100% 98%    General: He looks systemically well. Not toxic or septic. Cardiovascular: Heart sounds are present and normal without  murmurs. Respiratory: Lung fields are clear. He is alert and orientated.  Discharge Instructions  Discharge Orders   Future Orders Complete By Expires     Diet - low sodium heart healthy  As directed     Increase activity slowly  As directed         Medication List    TAKE these medications       gabapentin 300 MG capsule  Commonly known as:  NEURONTIN  Take 300 mg by mouth daily.     Oxycodone HCl 10 MG Tabs  Take 1 tablet (10 mg total) by mouth every 4 (four) hours as needed.      sulfamethoxazole-trimethoprim 800-160 MG per tablet  Commonly known as:  BACTRIM DS  Take 1 tablet by mouth 2 (two) times daily. Started on 04/27/13       Allergies  Allergen Reactions  . Darvocet (Propoxyphene-Acetaminophen)   . Tramadol       The results of significant diagnostics from this hospitalization (including imaging, microbiology, ancillary and laboratory) are listed below for reference.    Significant Diagnostic Studies: Dg Chest 2 View  04/27/2013  *RADIOLOGY REPORT*  Clinical Data: Shortness of breath.  CHEST - 2 VIEW  Comparison: None.  Findings: Hyperexpansion the lungs is noted consistent with chronic obstructive pulmonary disease.  Cardiomediastinal silhouette appears normal.  No acute pulmonary disease is noted.  Bony thorax is intact.  No pleural effusion or pneumothorax is noted.  IMPRESSION: Hyperexpansion of the lungs consistent with chronic obstructive pulmonary disease.  No acute cardiopulmonary abnormality seen.   Original Report Authenticated By: Marijo Conception.,  M.D.    Ct Abdomen Pelvis W Contrast  04/30/2013  *RADIOLOGY REPORT*  Clinical Data: Rapid weight loss of 100 pounds, bipolar disorder, depression  CT ABDOMEN AND PELVIS WITH CONTRAST  Technique:  Multidetector CT imaging of the abdomen and pelvis was performed following the standard protocol during bolus administration of intravenous contrast.  Contrast:  100 ml Omnipaque-300  Comparison: None.  Findings: Lung bases clear.  Normal heart size.  No pericardial or pleural effusion.  Abdomen:  Liver, gallbladder, pancreas, spleen, adrenal glands, and kidneys are within normal limits for age and demonstrate no acute process.  No abdominal free fluid, fluid collection, hemorrhage, abscess, or adenopathy.  Negative for bowel obstruction, dilatation, ileus, or free air. Normal retrocecal appendix demonstrated.  Pelvis:  No pelvic free fluid, fluid collection, hemorrhage, abscess, or hernia.  Urinary bladder  unremarkable.  No acute distal bowel process.  Mild bilateral inguinal adenopathy noted, measuring 2 cm on the right image 98 and 2.85 x 1.5 cm on the left, image 97.  This is nonspecific and may be reactive versus lymphoproliferative process.  Diffuse degenerative changes of the spine.  No compression fracture or acute osseous finding.  IMPRESSION: No acute intra-abdominal or pelvic finding.  Nonspecific mild bilateral inguinal adenopathy.   Original Report Authenticated By: Jerilynn Mages. Annamaria Boots, M.D.    Mr Foot Right Wo Contrast  04/30/2013  *RADIOLOGY REPORT*  Clinical Data: Arthropathy of the second and third MTP joints. Chronic plantar ulcer.  MRI OF THE RIGHT FOREFOOT WITHOUT CONTRAST  Technique:  Multiplanar, multisequence MR imaging was performed. No intravenous contrast was administered.  Comparison: Radiographs of 04/27/2013  Findings: The patient refused gadolinium contrast medium.  There are transverse fractures through the heads of the second and third metatarsals, with osseous and surrounding soft tissue edema, and with lateral subluxation of the adjacent proximal phalanges with respect to the metatarsal heads.  Small plantar effusion of the second metatarsophalangeal joint noted at that may be some blistering in the plantar soft tissues underlying the second and third MTP joints.  Surrounding marrow edema is present both of these metatarsal heads and in the bases of the proximal phalanges.  There is some patchy edema in the head of the first metatarsal. There is abnormal edema throughout the distal phalanx of the great toe.  The Lisfranc ligament appears intact.  No malalignment at the Lisfranc joint.  Subtle edema proximally in the first metatarsal is observed along with subtle edema in the proximal metadiaphysis of the third and fourth metatarsals.  There is abnormal thickening of the distal medial band the plantar fascia.  First digit sesamoids intact.  Small effusion of the first MTP joint noted dorsally.   IMPRESSION:  1.  Fractures of the heads of the second and third metatarsals with extensive surrounding edema, particularly tracking to the plantar surface where there is likely underlying ulceration and blistering. I suspect that the fracturing is accompanied by both soft tissue and osseous infection involving the distal metatarsals and potentially the bases of the proximal phalanges. 2.  Abnormal edema in the distal phalanx of the great toe, likewise suspicious for osteomyelitis.  There is patchy edema proximally and distally in the first metatarsal which is nonspecific but which could also conceivably reflect early osteomyelitis. 3.  Distal abnormal thickening of the medial band the plantar fascia, query fibrosis or injury.   Original Report Authenticated By: Van Clines, M.D.    US Arterial Seg Multiple  04/30/2013  *RADIOLOGY REPORT*  Clinical Data: Chronic bilateral foot ulcers, history smoking, bilateral rest pain  BILAT LOWER EXTREMITY ARTERIAL SEGMENTAL EVAL  Comparison: None  Findings: Following segmental pressures obtained, in mm Hg:  Right brachial:         122 Right proximal thigh:   219 Right distal thigh:           177 Right calf:             160 Right ankle PT:         131 Right ankle DP:         159 Right ABI:              1.30 Right great toe:        123  Left brachial:          112 Left proximal thigh:    186 Left distal thigh:            208 Left calf:              148 Left ankle PT:          163 Left ankle DP:          161 Left ABI:               1.34 Left great toe:         128  Waveform analysis right lower extremity: Triphasic waveforms present to the ankles.  Waveform analysis left lower extremity: Triphasic waveforms present to the ankles.  IMPRESSION: Normal bilateral ABIs with normal triphasic wave forms at both ankles.   Original Report Authenticated By: Lavonia Dana, M.D.    Dg Foot Complete Left  04/27/2013  *RADIOLOGY REPORT*  Clinical Data: Plantar ulcer  LEFT FOOT - COMPLETE  3+ VIEW  Comparison: None.  Findings: No fracture or dislocation is noted.  No lytic destruction is seen to suggest osteomyelitis.  Spurring  of posterior calcaneus is noted. No soft tissue abnormality or radiopaque foreign body is noted.  IMPRESSION: No acute abnormality seen in left foot.   Original Report Authenticated By: Marijo Conception.,  M.D.    Dg Foot Complete Right  04/27/2013  *RADIOLOGY REPORT*  Clinical Data: Chronic plantar ulcer  RIGHT FOOT COMPLETE - 3+ VIEW  Comparison: None  Findings: Significant arthropathic changes involving the second and third metatarsal phalangeal joints.  Findings could be due to an inflammatory arthropathy but septic arthritis needs to be excluded particularly given the history of a chronic plantar ulcer and the diffuse soft tissue swelling is present.  The remaining joint spaces are maintained.  There are midfoot degenerative changes.  IMPRESSION:  Significant destructive bony changes at the second and third metatarsal phalangeal joints worrisome for septic arthritis.   Original Report Authenticated By: Marijo Sanes, M.D.         Labs: Basic Metabolic Panel:  Recent Labs Lab 04/29/13 2130 04/30/13 0536 05/01/13 0526  NA  --  138 138  K  --  4.4 4.0  CL  --  103 101  CO2  --  28 26  GLUCOSE  --  93 84  BUN  --  10 13  CREATININE 0.94 0.98 1.01  CALCIUM  --  8.7 9.2   Liver Function Tests:  Recent Labs Lab 04/30/13 0536 05/01/13 0526  AST 19 18  ALT 17 17  ALKPHOS 81 89  BILITOT 0.2* 0.2*  PROT 6.6 7.2  ALBUMIN 3.4* 3.7     CBC:  Recent Labs Lab 04/29/13 2130 04/30/13 0536 05/01/13 0526  WBC 11.2* 12.0* 11.8*  HGB 14.7 15.5 15.4  HCT 42.0 44.5 44.7  MCV 85.0 85.7 85.8  PLT 209 195 217    CBG:  Recent Labs Lab 04/29/13 2218  GLUCAP 104*       Signed:  Haywood C  [facility] 05/02/2013, 10:31 AM

## 2013-05-02 NOTE — Consult Note (Signed)
Reason for Consult:ulceration, metatarsal fractures, osteomyelitis right foot  Referring Physician: Dr Wallace Keller is an 37 y.o. male.  HPI:  is a pleasant 37 y.o. male with past medical hx of COPD, bipolar, cutting, depression, DDD who presents to ED cc right foot pain. Information obtained from patient. States that 2 years ago he had callus on ball of foot on right that he used a hummus stone on. The skin fell off and a wound formed. For the past 2 years this wound has gradually worsened in spite of him cleaning it, wrapping it, soaking it and using OTC antibiotic ointment. During this time, the pain has gradually worsened and his ability to ambulate has been compromised. He denies fever, chills, nausea, diarrhea. He does report bilateral numbness and tingling. He states at times the wound would drain and have foul odor. He also developed smaller ulcer on left foot several months ago and his right toe as well. Reports going to [facility] ago and was given antibiotic and xrays were taken as well as follow up appointment with wound clinic for 05/07/13. He states the pain has increased to point he can barely bear weight so he came to ED. Work up in ED yield white count 12 and xray right foot concerning for septic arthritis. [facility] asked to admit  He has been seen by the free clinic.  Past Medical History  Diagnosis Date  . Bipolar 1 disorder   . Depression   . Panic attacks   . DDD (degenerative disc disease), lumbar     and thoracic  . Obesity   . Tobacco abuse   . Deliberate self-cutting     Past Surgical History  Procedure Laterality Date  . External ear surgery      History reviewed. No pertinent family history.  Social History:  reports that he has been smoking.  He does not have any smokeless tobacco history on file. He reports that  drinks alcohol. He reports that he does not use illicit drugs.  Allergies:  Allergies  Allergen Reactions  . Darvocet  (Propoxyphene-Acetaminophen)   . Tramadol     Medications: I have reviewed the patient's current medications. Current facility-administered medications:alum & mag hydroxide-simeth (MAALOX/MYLANTA) 200-200-20 MG/5ML suspension 30 mL, 30 mL, Oral, Q6H PRN, Acquanetta Chain, DO;  docusate sodium (COLACE) capsule 100 mg, 100 mg, Oral, BID, Acquanetta Chain, DO, 100 mg at 05/02/13 0900;  enoxaparin (LOVENOX) injection 60 mg, 60 mg, Subcutaneous, Q24H, Acquanetta Chain, DO, 60 mg at 05/01/13 2250 feeding supplement (ENSURE COMPLETE) liquid 237 mL, 237 mL, Oral, BID BM, Frederik Schmidt, RD, 237 mL at 05/02/13 0911;  multivitamin with minerals tablet 1 tablet, 1 tablet, Oral, Daily, Acquanetta Chain, DO, 1 tablet at 05/02/13 0900;  nicotine (NICODERM CQ - dosed in mg/24 hours) patch 21 mg, 21 mg, Transdermal, QHS, Acquanetta Chain, DO, 21 mg at 05/01/13 2250 ondansetron (ZOFRAN) injection 4 mg, 4 mg, Intravenous, Q6H PRN, Acquanetta Chain, DO;  ondansetron [facility]) tablet 4 mg, 4 mg, Oral, Q6H PRN, Acquanetta Chain, DO;  oxyCODONE (Oxy IR/ROXICODONE) immediate release tablet 10 mg, 10 mg, Oral, Q4H PRN, Acquanetta Chain, DO, 10 mg at 05/02/13 0912;  piperacillin-tazobactam (ZOSYN) IVPB 3.375 g, 3.375 g, Intravenous, Q8H, Nimish C Gosrani, MD, 3.375 g at 05/02/13 0410 polyethylene glycol (MIRALAX / GLYCOLAX) packet 17 g, 17 g, Oral, Daily PRN, Acquanetta Chain, DO;  vancomycin (VANCOCIN) IVPB 1000 mg/200 mL premix,  1,000 mg, Intravenous, Q8H, Nimish C Gosrani, MD, 1,000 mg at 05/02/13 0901;  zolpidem (AMBIEN) tablet 5 mg, 5 mg, Oral, QHS PRN,MR X 1, Acquanetta Chain, DO  Results for orders placed during the hospital encounter of 04/29/13 (from the past 48 hour(s))  URINALYSIS, ROUTINE W REFLEX MICROSCOPIC     Status: Abnormal   Collection Time    04/30/13  2:16 PM      Result Value Range   Color, Urine YELLOW  YELLOW   APPearance CLEAR  CLEAR   Specific Gravity, Urine <1.005  (*) 1.005 - 1.030   pH 6.0  5.0 - 8.0   Glucose, UA NEGATIVE  NEGATIVE mg/dL   Hgb urine dipstick TRACE (*) NEGATIVE   Bilirubin Urine NEGATIVE  NEGATIVE   Ketones, ur NEGATIVE  NEGATIVE mg/dL   Protein, ur NEGATIVE  NEGATIVE mg/dL   Urobilinogen, UA 0.2  0.0 - 1.0 mg/dL   Nitrite NEGATIVE  NEGATIVE   Leukocytes, UA NEGATIVE  NEGATIVE  URINE MICROSCOPIC-ADD ON     Status: None   Collection Time    04/30/13  2:16 PM      Result Value Range   RBC / HPF 0-2  <3 RBC/hpf  CBC     Status: Abnormal   Collection Time    05/01/13  5:26 AM      Result Value Range   WBC 11.8 (*) 4.0 - 10.5 K/uL   RBC 5.21  4.22 - 5.81 MIL/uL   Hemoglobin 15.4  13.0 - 17.0 g/dL   HCT 44.7  39.0 - 52.0 %   MCV 85.8  78.0 - 100.0 fL   MCH 29.6  26.0 - 34.0 pg   MCHC 34.5  30.0 - 36.0 g/dL   RDW 13.8  11.5 - 15.5 %   Platelets 217  150 - 400 K/uL  COMPREHENSIVE METABOLIC PANEL     Status: Abnormal   Collection Time    05/01/13  5:26 AM      Result Value Range   Sodium 138  135 - 145 mEq/L   Potassium 4.0  3.5 - 5.1 mEq/L   Chloride 101  96 - 112 mEq/L   CO2 26  19 - 32 mEq/L   Glucose, Bld 84  70 - 99 mg/dL   BUN 13  6 - 23 mg/dL   Creatinine, Ser 1.01  0.50 - 1.35 mg/dL   Calcium 9.2  8.4 - 10.5 mg/dL   Total Protein 7.2  6.0 - 8.3 g/dL   Albumin 3.7  3.5 - 5.2 g/dL   AST 18  0 - 37 U/L   ALT 17  0 - 53 U/L   Alkaline Phosphatase 89  39 - 117 U/L   Total Bilirubin 0.2 (*) 0.3 - 1.2 mg/dL   GFR calc non Af Amer >90  >90 mL/min   GFR calc Af Amer >90  >90 mL/min   Comment:            The eGFR has been calculated     using the CKD EPI equation.     This calculation has not been     validated in all clinical     situations.     eGFR's persistently     <90 mL/min signify     possible Chronic Kidney Disease.  VANCOMYCIN, TROUGH     Status: None   Collection Time    05/01/13  5:27 AM      Result Value Range   Vancomycin  Tr 15.7  10.0 - 20.0 ug/mL    Ct Abdomen Pelvis W Contrast  04/30/2013   *RADIOLOGY REPORT*  Clinical Data: Rapid weight loss of 100 pounds, bipolar disorder, depression  CT ABDOMEN AND PELVIS WITH CONTRAST  Technique:  Multidetector CT imaging of the abdomen and pelvis was performed following the standard protocol during bolus administration of intravenous contrast.  Contrast:  100 ml Omnipaque-300  Comparison: None.  Findings: Lung bases clear.  Normal heart size.  No pericardial or pleural effusion.  Abdomen:  Liver, gallbladder, pancreas, spleen, adrenal glands, and kidneys are within normal limits for age and demonstrate no acute process.  No abdominal free fluid, fluid collection, hemorrhage, abscess, or adenopathy.  Negative for bowel obstruction, dilatation, ileus, or free air. Normal retrocecal appendix demonstrated.  Pelvis:  No pelvic free fluid, fluid collection, hemorrhage, abscess, or hernia.  Urinary bladder unremarkable.  No acute distal bowel process.  Mild bilateral inguinal adenopathy noted, measuring 2 cm on the right image 98 and 2.85 x 1.5 cm on the left, image 97.  This is nonspecific and may be reactive versus lymphoproliferative process.  Diffuse degenerative changes of the spine.  No compression fracture or acute osseous finding.  IMPRESSION: No acute intra-abdominal or pelvic finding.  Nonspecific mild bilateral inguinal adenopathy.   Original Report Authenticated By: Jerilynn Mages. Annamaria Boots, M.D.    Mr Foot Right Wo Contrast  04/30/2013  *RADIOLOGY REPORT*  Clinical Data: Arthropathy of the second and third MTP joints. Chronic plantar ulcer.  MRI OF THE RIGHT FOREFOOT WITHOUT CONTRAST  Technique:  Multiplanar, multisequence MR imaging was performed. No intravenous contrast was administered.  Comparison: Radiographs of 04/27/2013  Findings: The patient refused gadolinium contrast medium.  There are transverse fractures through the heads of the second and third metatarsals, with osseous and surrounding soft tissue edema, and with lateral subluxation of the adjacent proximal  phalanges with respect to the metatarsal heads.  Small plantar effusion of the second metatarsophalangeal joint noted at that may be some blistering in the plantar soft tissues underlying the second and third MTP joints.  Surrounding marrow edema is present both of these metatarsal heads and in the bases of the proximal phalanges.  There is some patchy edema in the head of the first metatarsal. There is abnormal edema throughout the distal phalanx of the great toe.  The Lisfranc ligament appears intact.  No malalignment at the Lisfranc joint.  Subtle edema proximally in the first metatarsal is observed along with subtle edema in the proximal metadiaphysis of the third and fourth metatarsals.  There is abnormal thickening of the distal medial band the plantar fascia.  First digit sesamoids intact.  Small effusion of the first MTP joint noted dorsally.  IMPRESSION:  1.  Fractures of the heads of the second and third metatarsals with extensive surrounding edema, particularly tracking to the plantar surface where there is likely underlying ulceration and blistering. I suspect that the fracturing is accompanied by both soft tissue and osseous infection involving the distal metatarsals and potentially the bases of the proximal phalanges. 2.  Abnormal edema in the distal phalanx of the great toe, likewise suspicious for osteomyelitis.  There is patchy edema proximally and distally in the first metatarsal which is nonspecific but which could also conceivably reflect early osteomyelitis. 3.  Distal abnormal thickening of the medial band the plantar fascia, query fibrosis or injury.   Original Report Authenticated By: Van Clines, M.D.    US Arterial Seg Multiple  04/30/2013  *RADIOLOGY  REPORT*  Clinical Data: Chronic bilateral foot ulcers, history smoking, bilateral rest pain  BILAT LOWER EXTREMITY ARTERIAL SEGMENTAL EVAL  Comparison: None  Findings: Following segmental pressures obtained, in mm Hg:  Right brachial:          122 Right proximal thigh:   219 Right distal thigh:           177 Right calf:             160 Right ankle PT:         131 Right ankle DP:         159 Right ABI:              1.30 Right great toe:        123  Left brachial:          112 Left proximal thigh:    186 Left distal thigh:            208 Left calf:              148 Left ankle PT:          163 Left ankle DP:          161 Left ABI:               1.34 Left great toe:         128  Waveform analysis right lower extremity: Triphasic waveforms present to the ankles.  Waveform analysis left lower extremity: Triphasic waveforms present to the ankles.  IMPRESSION: Normal bilateral ABIs with normal triphasic wave forms at both ankles.   Original Report Authenticated By: Lavonia Dana, M.D.     Review of Systems  Constitutional: Positive for fever and chills.  Musculoskeletal: Positive for joint pain.  Neurological: Positive for tingling and sensory change.  Psychiatric/Behavioral: Positive for depression. The patient is nervous/anxious.   All other systems reviewed and are negative.   Blood pressure 108/69, pulse 62, temperature 98.1 F (36.7 C), temperature source Oral, resp. rate 20, height 6\' 2"  (1.88 m), weight 249 lb (112.946 kg), SpO2 98.00%. Physical Exam  Constitutional: Vital signs are normal. He appears well-developed and well-nourished. He is cooperative.  Non-toxic appearance. He does not have a sickly appearance. He does not appear ill. No distress.  HENT:  Head: Head is without right periorbital erythema and without left periorbital erythema.  Eyes: Right conjunctiva is not injected. Left conjunctiva is not injected.  Neck: Trachea normal. No tracheal deviation present.  Cardiovascular: Normal rate and regular rhythm.   Respiratory: No stridor. No respiratory distress.  Lymphadenopathy:    He has no cervical adenopathy.       Right: No inguinal adenopathy present.       Left: No inguinal adenopathy present.  Neurological: He  is alert. He has normal strength. A sensory deficit is present. He exhibits normal muscle tone. He displays no Babinski's sign on the right side. He displays no Babinski's sign on the left side.  Sensory loss both feet  Probable peripheral neuropathy   Skin:     Psychiatric: He has a normal mood and affect. His speech is normal and behavior is normal. Thought content normal.  Ortho Exam Upper extremity exam  The right and left upper extremity:   Inspection and palpation revealed no abnormalities in the upper extremities.   Range of motion is full without contracture.  Motor exam is normal with grade 5 strength.  The joints are fully reduced without subluxation.  There  is no atrophy or tremor and muscle tone is normal.  All joints are stable.   Right foot/ankle Inspection reveals high plantar arch with For digits exhibiting claw toe deformity metatarsal head depression and plantar ulcer under second and third metatarsals with bunion deformity, as well as great toe interphalangeal joint degeneration and ulceration with callus formation on the medial side there is decreased sensation in the foot, despite a normal pulse The ankle joint is stable, no detectable muscle atrophy.  Left foot/ankle Inspection reveals a plantar aspect ulcer on the third metatarsal with depression of the metatarsal heads multiple claw toes multiple areas of callus formation secondary to foot deformity Range of motion at the ankle is normal No muscle atrophy detectable Joint stability at the ankle normal Assessment/Plan: A complete review of the medical record and x-rays MRIs ABI report have been noted.  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

## 2013-05-07 ENCOUNTER — Encounter (HOSPITAL_BASED_OUTPATIENT_CLINIC_OR_DEPARTMENT_OTHER): Payer: Medicaid Other | Attending: Internal Medicine

## 2013-05-07 DIAGNOSIS — F17200 Nicotine dependence, unspecified, uncomplicated: Secondary | ICD-10-CM | POA: Insufficient documentation

## 2013-05-07 DIAGNOSIS — L97509 Non-pressure chronic ulcer of other part of unspecified foot with unspecified severity: Secondary | ICD-10-CM | POA: Insufficient documentation

## 2013-05-07 DIAGNOSIS — G609 Hereditary and idiopathic neuropathy, unspecified: Secondary | ICD-10-CM | POA: Insufficient documentation

## 2013-05-07 DIAGNOSIS — R634 Abnormal weight loss: Secondary | ICD-10-CM | POA: Insufficient documentation

## 2013-05-07 DIAGNOSIS — Z79899 Other long term (current) drug therapy: Secondary | ICD-10-CM | POA: Insufficient documentation

## 2013-05-08 NOTE — Progress Notes (Signed)
[facility]  NAME:  Hunter Reyes, Hunter Reyes             ACCOUNT NO.:  [id number]  MEDICAL RECORD NO.:  [id number]      DATE OF BIRTH:  04-Jul-1976  PHYSICIAN:  Ricard Dillon, M.D. VISIT DATE:  05/07/2013                                  OFFICE VISIT   CHIEF COMPLAINT:  Here for review of wounds on his bilateral feet referred through [facility].  HISTORY OF PRESENT ILLNESS:  Mr. Kawecki is a 37 year old man who comes for our review of chronic ulcers on his bilateral feet.  He tells me that the original wound was on the plantar aspect of his right foot for roughly 2 years.  This started as a callus.  He used pumice stone, however, this opened.  Most of the time, he has been using topical triple antibiotic therapy on this.  He also had a necrotic area to the right great toe for roughly 6 months and an area on the left plantar, probably 3rd metatarsal head for the last year.  He was admitted to [facility] for increasing pain and deteriorating status to these wounds from roughly 5.7 through 5.10.  At that point, he was given empiric broad-spectrum antibiotics.  An MRI was done that shows fractures of the 2nd and 3rd metatarsals with surrounding edema particularly tracking down to the plantar surface where there is likely underlying ulceration and blistering.  He has probably soft tissue and osseous infection involving the distal metatarsals on the 2nd and 3rd, as well as the base of the proximal phalanges.  Also suspicion of osteomyelitis involving the right great toe.  He has been on Septra, I believe, since his discharge.  I do not see any relevant cultures.  He was noted in the hospital to have rapid weight loss over the last year of 100 pounds.  He had a CT scan of the abdomen that showed nonspecific inguinal adenopathy, but no other worrisome lesions.  PAST MEDICAL HISTORY: 1. Bipolar affective disorder. 2. Depression. 3.  Degenerative disk disease, lumbar and thoracic. 4. Continued tobacco abuse. 5. A history of idiopathic peripheral neuropathy.  Recent hemoglobin     A1c was 5.1. 6. History of childhood seizures, none recently.  MEDICATION LIST:  Reviewed.  He is on: 1. Bactrim DS 1 tablet twice a day. 2. Neurontin 300 twice a day. 3. Prozac 5 daily. 4. Abilify 5 daily.  SOCIAL HISTORY:  He lives in [place] with family.  He is accompanied today by his girlfriend.  He is a smoker.  FAMILY HISTORY:  Positive for diabetes in 2 siblings although the patient has been repetitively tested, and he does not appear to have diabetes himself.  REVIEW OF SYSTEMS:  GENERAL:  He admits to weight loss over the last year, which this seems to be quite substantial.  NEUROLOGIC:  Numbness and tingling dating back 5-6 years in his feet, which has been progressive.  PHYSICAL EXAMINATION:  VITAL SIGNS:  Temperature is 98.5, pulse 73, respirations 18, blood pressure 118/72. RESPIRATORY:  Clear air entry bilaterally. CARDIAC:  HEART:  Sounds were normal.  There is no murmurs. EXTREMITIES:  Peripheral pulses were palpable.  ABIs as calculated in this clinic were 1.3 on the right and 1.2 on the left.  There  does not appear to be any evidence of PAD, at least clinically.  WOUND EXAM:  The area most involved is on the plantar aspect of his right foot, roughly over the 2nd, 3rd, and 4th metatarsal heads extending into the base of the toes.  This was covered with a substantial necrotic cover, which was surgically debrided.  Under knees, there was necrotic tissue, malodorous.  At one point, there was purulent drainage here, which was cultured (at a point close to the bone).  The area over his right great toe had a blackened callus, which was removed. Surprisingly, there appeared to be a healthier surface here.  The area over his left 3rd metatarsal head also underwent a surgical debridement. This has a small open  area without obvious clinical evidence of infection.  IMPRESSION: 1. Neuropathic ulcers as described above.  I suspect he has underlying     osteomyelitis on the right foot.  He has seen Dr. Aline Brochure, came in     with a Cam walker.  I have done a deep culture of this, which is     clearly not a bone culture, however, I would think would be as     close to true representation as possible, i.e., purulent drainage     close to the bone.  I have not altered his Septra, which he is on,     but will await further culture results.  I have asked for an     infectious disease consultation.  The proper approach here is     likely to be on IV antibiotics plus or minus orthopedic surgery,     and I note he has already seen Dr. Aline Brochure, although I am not     certain whether Dr. Aline Brochure does this form of surgery or not. 2. Idiopathic peripheral neuropathy at this point or at least not a     diabetic peripheral neuropathy.  This is mostly sensory and has     been chronic, i.e., 5-6 years.  He has hammertoes, subluxation of     his metatarsal heads.  He is using a Cam walker and appears to be     compliant. 3. Weight loss.  Dr. Anastasio Champion noted this in the hospital and did a CT     scan of the abdomen, which was not all that remarkable, although I     suspect this gentleman will need more inclusive workup.  He has     been followed in the clinic in [place] for.  I got to the     wounds, however, I believe he has a followup appointment at which     point, the basic questions of weight loss, neuropathy probably     should be followed. 4. With regards to the underlying wounds we address these with silver     alginate in wraps, these can remain in place.  He would continue     with the CAM walker on the right side.  I have continued his Septra     pending the culture results.  As noted, I have asked for an     infectious disease consult.  I will review him again in a week's     time.           ______________________________ Ricard Dillon, M.D.     MGR/MEDQ  D:  05/07/2013  T:  05/08/2013  Job:  [id number]

## 2013-05-12 ENCOUNTER — Encounter: Payer: Self-pay | Admitting: Internal Medicine

## 2013-05-14 ENCOUNTER — Ambulatory Visit (INDEPENDENT_AMBULATORY_CARE_PROVIDER_SITE_OTHER): Payer: Medicaid Other | Admitting: Internal Medicine

## 2013-05-14 ENCOUNTER — Encounter: Payer: Self-pay | Admitting: Internal Medicine

## 2013-05-14 VITALS — BP 124/76 | HR 67 | Temp 98.2°F | Wt 256.0 lb

## 2013-05-14 DIAGNOSIS — M869 Osteomyelitis, unspecified: Secondary | ICD-10-CM

## 2013-05-14 LAB — CBC WITH DIFFERENTIAL/PLATELET
Eosinophils Relative: 4 % (ref 0–5)
HCT: 43.6 % (ref 39.0–52.0)
Lymphocytes Relative: 34 % (ref 12–46)
Lymphs Abs: 4.5 10*3/uL — ABNORMAL HIGH (ref 0.7–4.0)
MCV: 85 fL (ref 78.0–100.0)
Monocytes Absolute: 1.3 10*3/uL — ABNORMAL HIGH (ref 0.1–1.0)
RBC: 5.13 MIL/uL (ref 4.22–5.81)
RDW: 14.3 % (ref 11.5–15.5)
WBC: 13.4 10*3/uL — ABNORMAL HIGH (ref 4.0–10.5)

## 2013-05-14 MED ORDER — SULFAMETHOXAZOLE-TMP DS 800-160 MG PO TABS: 1.0000 | ORAL_TABLET | Freq: Two times a day (BID) | ORAL | Status: DC

## 2013-05-14 NOTE — Progress Notes (Signed)
[facility] NOTE  RFV: initial visit for osteomyelitis Subjective:    Patient ID: Hunter Reyes, male    DOB: 25-Nov-1976, 37 y.o.   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 orignated back in 2012. He was recently admitted to [facility] for evaluation of foot ulcers from 5/7-5/10, MRI showed fractures of 2nd and 3rd MT with edema and suspicious for early osteo of right great toe. Had ABI which were normal. Also evaluated by surgery who felt not amenable for surgery, just med management. He was discharged on bactrim. No cultures taken and to follow up with Dr. Dellia Nims for wound care management, goes for weekly debridement and do dressing change. No fever and chills.   On abtx off and on for other skin infections. And now on bactrim for his foot;this being the first time for long term antibiotics for deep tissue infection   His neuropathy is mid leg bilaterally, noted 5 years ago. Ulcers started 2 yrs on right foot and 1 yr of left foot. Is not followed by neurologist at this time.  Family hx: has siblings with dm c/b peripheral neuropathy  Current Outpatient Prescriptions on File Prior to Visit  Medication Sig Dispense Refill  . gabapentin (NEURONTIN) 300 MG capsule Take 300 mg by mouth 2 (two) times daily.       Marland Kitchen oxyCODONE 10 MG TABS Take 1 tablet (10 mg total) by mouth every 4 (four) hours as needed.  30 tablet  0  . sulfamethoxazole-trimethoprim (BACTRIM DS) 800-160 MG per tablet Take 1 tablet by mouth 2 (two) times daily. Started on 04/27/13       No current facility-administered medications on file prior to visit.   Active Ambulatory Problems    Diagnosis Date Noted  . Foot ulcer 04/29/2013  . Osteomyelitis 04/29/2013  . Obesity, unspecified 04/29/2013  . Tobacco abuse   . Loss of weight 04/30/2013   Resolved Ambulatory Problems    Diagnosis Date Noted  . No  Resolved Ambulatory Problems   Past Medical History  Diagnosis Date  . Bipolar 1 disorder   . Depression   . Panic attacks   . DDD (degenerative disc disease), lumbar   . Obesity   . Deliberate self-cutting    History  Substance Use Topics  . Smoking status: Current Every Day Smoker  . Smokeless tobacco: Not on file  . Alcohol Use: Yes     Comment: occ  -lives with family in [place]. Has girlfriend and son. Currently unemployed previously worked at [employer].  Family hx: diabetes, heart disease, prostate cancer  Review of Systems   Constitutional: Negative for fever, chills, diaphoresis, activity change, appetite change, fatigue and unexpected weight change.  HENT: Negative for congestion, sore throat, rhinorrhea, sneezing, trouble swallowing and sinus pressure.  Eyes: Negative for photophobia and visual disturbance.  Respiratory: Negative for cough, chest tightness, shortness of breath, wheezing and stridor.  Cardiovascular: Negative for chest pain, palpitations and leg swelling.  Gastrointestinal: Negative for nausea, vomiting, abdominal pain, diarrhea, constipation, blood in stool, abdominal distention and anal bleeding.  Genitourinary: Negative for dysuria, hematuria, flank pain and difficulty urinating.  Musculoskeletal: Negative for myalgias, back pain, joint swelling, arthralgias and gait problem.  Skin: Negative for color change, pallor, rash and wound.  Neurological: pain and numbness to lower extremity per hpi Hematological: Negative for adenopathy. Does not bruise/bleed easily.  Psychiatric/Behavioral: Negative for  behavioral problems, confusion, sleep disturbance, dysphoric mood, decreased concentration and agitation.       Objective:   Physical Exam BP 124/76  Pulse 67  Temp(Src) 98.2 F (36.8 C) (Oral)  Wt 256 lb (116.121 kg)  BMI 32.85 kg/m2 Physical Exam  Constitutional: He is oriented to person, place, and time. He appears well-developed and  well-nourished. No distress.  HENT:  Mouth/Throat: Oropharynx is clear and moist. No oropharyngeal exudate.  Cardiovascular: Normal rate, regular rhythm and normal heart sounds. Exam reveals no gallop and no friction rub.  No murmur heard.  Pulmonary/Chest: Effort normal and breath sounds normal. No respiratory distress. He has no wheezes.  Abdominal: Soft. Bowel sounds are normal. He exhibits no distension. There is no tenderness.  Lymphadenopathy:  no cervical adenopathy.  Ext: both feet are wrapped. With right leg in boot. +1 edema Neurological: He is alert and oriented to person, place, and time.  Skin: Skin is warm and dry. No rash noted. No erythema. Scattered hyperpigmented scarred lesions to legs Psychiatric: He has a normal mood and affect. His behavior is normal.       Assessment & Plan:  Osteo = will check cbc, sed rate, crp for near baseline values ( started abtx on 5/7). If levels are very high, may need to consider IV antibiotics, although this appears to be subacute/chronic. At next visit, will need to take down dressing. Unable to do so since he had dressing change today Dr. Dellia Nims instructions to keep wrapped for the next week  peirpheral neuropathy = continue on neurontin, not being followed by neurologist, consider referral.   Pain management = defer back to Dr. Dellia Nims since he is doing weekly debridement and likely causing some discomfort.  rtc 6 wk

## 2013-05-28 ENCOUNTER — Encounter (HOSPITAL_BASED_OUTPATIENT_CLINIC_OR_DEPARTMENT_OTHER): Payer: Medicaid Other | Attending: Internal Medicine

## 2013-05-28 DIAGNOSIS — M86679 Other chronic osteomyelitis, unspecified ankle and foot: Secondary | ICD-10-CM | POA: Insufficient documentation

## 2013-05-28 DIAGNOSIS — G5790 Unspecified mononeuropathy of unspecified lower limb: Secondary | ICD-10-CM | POA: Insufficient documentation

## 2013-05-28 DIAGNOSIS — L97509 Non-pressure chronic ulcer of other part of unspecified foot with unspecified severity: Secondary | ICD-10-CM | POA: Insufficient documentation

## 2013-06-04 ENCOUNTER — Ambulatory Visit (HOSPITAL_COMMUNITY)
Admission: RE | Admit: 2013-06-04 | Discharge: 2013-06-04 | Disposition: A | Discharge status: Home / Self Care | Payer: Medicaid Other | Source: Ambulatory Visit | Attending: Internal Medicine | Admitting: Internal Medicine

## 2013-06-04 ENCOUNTER — Other Ambulatory Visit (HOSPITAL_BASED_OUTPATIENT_CLINIC_OR_DEPARTMENT_OTHER): Payer: Self-pay | Admitting: Internal Medicine

## 2013-06-04 DIAGNOSIS — M79609 Pain in unspecified limb: Secondary | ICD-10-CM | POA: Insufficient documentation

## 2013-06-04 DIAGNOSIS — T148XXA Other injury of unspecified body region, initial encounter: Secondary | ICD-10-CM

## 2013-06-04 DIAGNOSIS — S91109A Unspecified open wound of unspecified toe(s) without damage to nail, initial encounter: Secondary | ICD-10-CM | POA: Insufficient documentation

## 2013-06-04 DIAGNOSIS — M7989 Other specified soft tissue disorders: Secondary | ICD-10-CM | POA: Insufficient documentation

## 2013-06-04 DIAGNOSIS — X58XXXA Exposure to other specified factors, initial encounter: Secondary | ICD-10-CM | POA: Insufficient documentation

## 2013-06-04 DIAGNOSIS — M24473 Recurrent dislocation, unspecified ankle: Secondary | ICD-10-CM | POA: Insufficient documentation

## 2013-06-24 ENCOUNTER — Encounter: Payer: Self-pay | Admitting: Internal Medicine

## 2013-06-24 ENCOUNTER — Ambulatory Visit (INDEPENDENT_AMBULATORY_CARE_PROVIDER_SITE_OTHER): Payer: Self-pay | Admitting: Internal Medicine

## 2013-06-24 VITALS — BP 117/69 | HR 72 | Temp 97.7°F | Wt 272.0 lb

## 2013-06-24 DIAGNOSIS — M86171 Other acute osteomyelitis, right ankle and foot: Secondary | ICD-10-CM

## 2013-06-24 DIAGNOSIS — M86179 Other acute osteomyelitis, unspecified ankle and foot: Secondary | ICD-10-CM

## 2013-06-24 LAB — CBC WITH DIFFERENTIAL/PLATELET
Basophils Absolute: 0.1 10*3/uL (ref 0.0–0.1)
Basophils Relative: 1 % (ref 0–1)
Eosinophils Relative: 4 % (ref 0–5)
HCT: 42 % (ref 39.0–52.0)
Lymphocytes Relative: 24 % (ref 12–46)
MCHC: 36.7 g/dL — ABNORMAL HIGH (ref 30.0–36.0)
MCV: 83.2 fL (ref 78.0–100.0)
Monocytes Absolute: 1.4 10*3/uL — ABNORMAL HIGH (ref 0.1–1.0)
RDW: 14.4 % (ref 11.5–15.5)

## 2013-06-24 LAB — BASIC METABOLIC PANEL
BUN: 10 mg/dL (ref 6–23)
CO2: 22 mEq/L (ref 19–32)
Chloride: 107 mEq/L (ref 96–112)
Creat: 1.02 mg/dL (ref 0.50–1.35)

## 2013-06-24 NOTE — Progress Notes (Signed)
  Subjective:    Patient ID: Hunter Reyes, male    DOB: Feb 13, 1976, 37 y.o.   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 orignated back in 2012. He was recently admitted to [facility] for evaluation of foot ulcers from 5/7-5/10, MRI showed fractures of 2nd and 3rd MT with edema and suspicious for early osteo of right great toe. Had ABI which were normal. Also evaluated by surgery who felt not amenable for surgery, just med management, currently on bactrim.. No cultures taken and to follow up with Dr. Dellia Reyes for wound care management, goes for weekly debridement and do dressing change. No fever and chills.   Last week started on amox for dental abscess x 10 days.  Sees Hunter Reyes every thur. For wound debridement. Does weekly dressing cahgnes  Current Outpatient Prescriptions on File Prior to Visit  Medication Sig Dispense Refill  . ARIPiprazole (ABILIFY) 5 MG tablet Take 5 mg by mouth daily.      Marland Kitchen FLUoxetine (PROZAC) 10 MG tablet Take 5 mg by mouth daily.      Marland Kitchen gabapentin (NEURONTIN) 300 MG capsule Take 300 mg by mouth 2 (two) times daily.       Marland Kitchen sulfamethoxazole-trimethoprim (BACTRIM DS) 800-160 MG per tablet Take 1 tablet by mouth 2 (two) times daily. Started on 04/27/13  60 tablet  3  . oxyCODONE 10 MG TABS Take 1 tablet (10 mg total) by mouth every 4 (four) hours as needed.  30 tablet  0   No current facility-administered medications on file prior to visit.   Active Ambulatory Problems    Diagnosis Date Noted  . Foot ulcer 04/29/2013  . Osteomyelitis 04/29/2013  . Obesity, unspecified 04/29/2013  . Tobacco abuse   . Loss of weight 04/30/2013   Resolved Ambulatory Problems    Diagnosis Date Noted  . No Resolved Ambulatory Problems   Past Medical History  Diagnosis Date  . Bipolar 1 disorder   . Depression   . Panic attacks   . DDD (degenerative disc disease),  lumbar   . Obesity   . Deliberate self-cutting      Review of Systems     Objective:   Physical Exam  BP 117/69  Pulse 72  Temp(Src) 97.7 F (36.5 C) (Oral)  Wt 272 lb (123.378 kg)  BMI 34.91 kg/m2 Right foot: ulcer on plantar aspect of foot 1cm, shallow no drainage  Lab Results  Component Value Date   ESRSEDRATE 1 05/14/2013   Lab Results  Component Value Date   CRP <0.5 05/14/2013       Assessment & Plan:  Will check sed rate, crp and bmp.  Continue with bactrim DS bID  Cc: Hunter Reyes

## 2013-06-25 ENCOUNTER — Encounter (HOSPITAL_BASED_OUTPATIENT_CLINIC_OR_DEPARTMENT_OTHER): Payer: Medicaid Other | Attending: General Surgery

## 2013-06-25 ENCOUNTER — Ambulatory Visit: Payer: Self-pay | Admitting: Internal Medicine

## 2013-06-25 DIAGNOSIS — L84 Corns and callosities: Secondary | ICD-10-CM | POA: Insufficient documentation

## 2013-06-25 DIAGNOSIS — L97509 Non-pressure chronic ulcer of other part of unspecified foot with unspecified severity: Secondary | ICD-10-CM | POA: Insufficient documentation

## 2013-06-25 DIAGNOSIS — M86679 Other chronic osteomyelitis, unspecified ankle and foot: Secondary | ICD-10-CM | POA: Insufficient documentation

## 2013-06-25 DIAGNOSIS — G5790 Unspecified mononeuropathy of unspecified lower limb: Secondary | ICD-10-CM | POA: Insufficient documentation

## 2013-07-22 ENCOUNTER — Encounter: Payer: Self-pay | Admitting: Internal Medicine

## 2013-07-22 ENCOUNTER — Ambulatory Visit (INDEPENDENT_AMBULATORY_CARE_PROVIDER_SITE_OTHER): Payer: Self-pay | Admitting: Internal Medicine

## 2013-07-22 VITALS — BP 110/70 | HR 75 | Temp 97.7°F | Wt 286.0 lb

## 2013-07-22 DIAGNOSIS — M86171 Other acute osteomyelitis, right ankle and foot: Secondary | ICD-10-CM

## 2013-07-22 DIAGNOSIS — M86179 Other acute osteomyelitis, unspecified ankle and foot: Secondary | ICD-10-CM

## 2013-07-22 LAB — CBC WITH DIFFERENTIAL/PLATELET
Basophils Absolute: 0.1 10*3/uL (ref 0.0–0.1)
Eosinophils Relative: 5 % (ref 0–5)
HCT: 42.3 % (ref 39.0–52.0)
Lymphocytes Relative: 26 % (ref 12–46)
MCV: 84.4 fL (ref 78.0–100.0)
Monocytes Absolute: 1.6 10*3/uL — ABNORMAL HIGH (ref 0.1–1.0)
Monocytes Relative: 14 % — ABNORMAL HIGH (ref 3–12)
RDW: 14.8 % (ref 11.5–15.5)
WBC: 11.4 10*3/uL — ABNORMAL HIGH (ref 4.0–10.5)

## 2013-07-22 NOTE — Progress Notes (Signed)
[facility]: follow up for osteomyelitis at 4 wk follow up Subjective:    Patient ID: Hunter Reyes, male    DOB: 1976/01/18, 37 y.o.   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 orignated back in 2012. He was recently admitted to [facility] for evaluation of foot ulcers from 5/7-10, MRI showed fractures of 2nd and 3rd MT with edema and suspicious for early osteo of right great toe.  ABI were normal.  surgery felt not amenable for surgery, just med management, currently on bactrim. No cultures taken and to follow up with Dr. Dellia Nims for wound care management, goes for weekly debridement and do dressing change. Healing well. Getting fitted for a cast. No fever and chills. At last visit his inflammatory markers were normal.  Current Outpatient Prescriptions on File Prior to Visit  Medication Sig Dispense Refill  . ARIPiprazole (ABILIFY) 5 MG tablet Take 5 mg by mouth daily.      . citalopram (CELEXA) 20 MG tablet Take 20 mg by mouth daily.      Marland Kitchen gabapentin (NEURONTIN) 300 MG capsule Take 300 mg by mouth 2 (two) times daily.       Marland Kitchen sulfamethoxazole-trimethoprim (BACTRIM DS) 800-160 MG per tablet Take 1 tablet by mouth 2 (two) times daily. Started on 04/27/13  60 tablet  3  . amoxicillin (AMOXIL) 500 MG tablet Take 500 mg by mouth 3 (three) times daily.       No current facility-administered medications on file prior to visit.   Active Ambulatory Problems    Diagnosis Date Noted  . Foot ulcer 04/29/2013  . Osteomyelitis 04/29/2013  . Obesity, unspecified 04/29/2013  . Tobacco abuse   . Loss of weight 04/30/2013   Resolved Ambulatory Problems    Diagnosis Date Noted  . No Resolved Ambulatory Problems   Past Medical History  Diagnosis Date  . Bipolar 1 disorder   . Depression   . Panic attacks   . DDD (degenerative disc disease), lumbar   . Obesity   . Deliberate self-cutting       Review of Systems  Constitutional: Negative for fever, chills, diaphoresis, activity change, appetite change, fatigue and unexpected weight change.  HENT: Negative for congestion, sore throat, rhinorrhea, sneezing, trouble swallowing and sinus pressure.  Eyes: Negative for photophobia and visual disturbance.  Respiratory: Negative for cough, chest tightness, shortness of breath, wheezing and stridor.  Cardiovascular: Negative for chest pain, palpitations and leg swelling.  Gastrointestinal: Negative for nausea, vomiting, abdominal pain, diarrhea, constipation, blood in stool, abdominal distention and anal bleeding.  Genitourinary: Negative for dysuria, hematuria, flank pain and difficulty urinating.  Musculoskeletal: Negative for myalgias, back pain, joint swelling, arthralgias and gait problem.  Skin: Negative for color change, pallor, rash and wound.  Neurological: Negative for dizziness, tremors, weakness and light-headedness.  Hematological: Negative for adenopathy. Does not bruise/bleed easily.  Psychiatric/Behavioral: Negative for behavioral problems, confusion, sleep disturbance, dysphoric mood, decreased concentration and agitation.       Objective:   Physical Exam BP 110/70  Pulse 75  Temp(Src) 97.7 F (36.5 C) (Oral)  Wt 286 lb (129.729 kg)  BMI 36.7 kg/m2 Physical Exam  Constitutional: He is oriented to person, place, and time. He appears well-developed and well-nourished. No distress.   Cardiovascular: Normal rate, regular rhythm and normal heart sounds. Exam reveals no gallop and no friction rub.  No murmur heard.  Pulmonary/Chest: Effort  normal and breath sounds normal. No respiratory distress. He has no wheezes.  Lymphadenopathy:  no cervical adenopathy.  Neurological:  alert and oriented to person, place, and time.  Skin: right foot is wrapped in a boot unable to take down dressings from wound care Psychiatric: He has a normal mood and affect. His behavior is  normal.   Labs: Lab Results  Component Value Date   CRP <0.5 06/24/2013   Lab Results  Component Value Date   ESRSEDRATE 1 06/24/2013   Lab Results  Component Value Date   WBC 12.4* 06/24/2013   HGB 15.4 06/24/2013   HCT 42.0 06/24/2013   MCV 83.2 06/24/2013   PLT 238 06/24/2013         Assessment & Plan:  Osteomyelitis = has been on antibiotics for nearly 3 months. Getting great would care. Will check sed rate and crp if still normalized can stop oral antibiotics.  Leukocytosis = at last lab likely related to tooth abscess. Will recheck CBC since patient concerned about elevated WBC. Anticipate to normalize   Cc: robson

## 2013-07-30 ENCOUNTER — Encounter (HOSPITAL_BASED_OUTPATIENT_CLINIC_OR_DEPARTMENT_OTHER): Payer: Medicaid Other | Attending: Internal Medicine

## 2013-07-30 DIAGNOSIS — L84 Corns and callosities: Secondary | ICD-10-CM | POA: Insufficient documentation

## 2013-07-30 DIAGNOSIS — M86679 Other chronic osteomyelitis, unspecified ankle and foot: Secondary | ICD-10-CM | POA: Insufficient documentation

## 2013-07-30 DIAGNOSIS — G589 Mononeuropathy, unspecified: Secondary | ICD-10-CM | POA: Insufficient documentation

## 2013-07-30 DIAGNOSIS — L97509 Non-pressure chronic ulcer of other part of unspecified foot with unspecified severity: Secondary | ICD-10-CM | POA: Insufficient documentation

## 2013-09-03 ENCOUNTER — Encounter (HOSPITAL_BASED_OUTPATIENT_CLINIC_OR_DEPARTMENT_OTHER): Payer: Medicaid Other | Attending: Internal Medicine

## 2013-09-03 DIAGNOSIS — L97509 Non-pressure chronic ulcer of other part of unspecified foot with unspecified severity: Secondary | ICD-10-CM | POA: Insufficient documentation

## 2013-09-03 DIAGNOSIS — L84 Corns and callosities: Secondary | ICD-10-CM | POA: Insufficient documentation

## 2013-09-03 DIAGNOSIS — M86679 Other chronic osteomyelitis, unspecified ankle and foot: Secondary | ICD-10-CM | POA: Insufficient documentation

## 2013-09-10 ENCOUNTER — Telehealth: Payer: Self-pay | Admitting: Orthopedic Surgery

## 2013-09-10 NOTE — Telephone Encounter (Signed)
Patient called, states had a hospital consult at [facility] in May, 2014, with Dr. Aline Brochure, and asked if Dr. Aline Brochure is taking new patients, as he had a quesioin about ankle, and about knee. In reviewing his consult note, Dr. Aline Brochure indicated:  "My recommendation is for the patient to continue with his appointment at the wound care center on May 15  Foot and ankle specialist consult to determine appropriate management of multiple deformities in the setting of osteomyelitis  Antibiotics to address the osteomyelitis  Cam Walker on the right to remove pressure from the plantar ulcer"  - Patient states he is already undergoing wound care, and will therefore also pursue the foot/ankle specialist appointment; said he now has [place] Medicaid, which required referral from primary care, which is [facility]; he will contact them and make sure he is being referred.  I relayed to patient that it appears that the foot and ankle is the priority at this time, and he agrees.

## 2013-09-24 ENCOUNTER — Encounter (HOSPITAL_BASED_OUTPATIENT_CLINIC_OR_DEPARTMENT_OTHER): Payer: Medicaid Other | Attending: Internal Medicine

## 2013-09-24 DIAGNOSIS — M86679 Other chronic osteomyelitis, unspecified ankle and foot: Secondary | ICD-10-CM | POA: Insufficient documentation

## 2013-09-24 DIAGNOSIS — L84 Corns and callosities: Secondary | ICD-10-CM | POA: Insufficient documentation

## 2013-09-24 DIAGNOSIS — G5790 Unspecified mononeuropathy of unspecified lower limb: Secondary | ICD-10-CM | POA: Insufficient documentation

## 2013-09-24 DIAGNOSIS — L97509 Non-pressure chronic ulcer of other part of unspecified foot with unspecified severity: Secondary | ICD-10-CM | POA: Insufficient documentation

## 2013-10-24 DIAGNOSIS — K0889 Other specified disorders of teeth and supporting structures: Secondary | ICD-10-CM

## 2013-10-24 HISTORY — DX: Other specified disorders of teeth and supporting structures: K08.89

## 2013-10-29 ENCOUNTER — Other Ambulatory Visit: Payer: Self-pay

## 2013-10-29 ENCOUNTER — Encounter (HOSPITAL_BASED_OUTPATIENT_CLINIC_OR_DEPARTMENT_OTHER): Payer: Medicaid Other | Attending: Internal Medicine

## 2013-10-29 DIAGNOSIS — L97509 Non-pressure chronic ulcer of other part of unspecified foot with unspecified severity: Secondary | ICD-10-CM | POA: Insufficient documentation

## 2013-10-29 DIAGNOSIS — M86679 Other chronic osteomyelitis, unspecified ankle and foot: Secondary | ICD-10-CM | POA: Insufficient documentation

## 2013-10-29 DIAGNOSIS — G589 Mononeuropathy, unspecified: Secondary | ICD-10-CM | POA: Insufficient documentation

## 2013-10-29 DIAGNOSIS — L84 Corns and callosities: Secondary | ICD-10-CM | POA: Insufficient documentation

## 2013-11-02 ENCOUNTER — Encounter (HOSPITAL_BASED_OUTPATIENT_CLINIC_OR_DEPARTMENT_OTHER): Payer: Self-pay | Admitting: *Deleted

## 2013-11-02 DIAGNOSIS — L97519 Non-pressure chronic ulcer of other part of right foot with unspecified severity: Secondary | ICD-10-CM

## 2013-11-02 HISTORY — DX: Non-pressure chronic ulcer of other part of right foot with unspecified severity: L97.519

## 2013-11-05 NOTE — H&P (Signed)
HISTORY AND PHYSICAL  Hunter Reyes is a 37 y.o. male patient with CC: painful teeth  No diagnosis found.  Past Medical History  Diagnosis Date  . Bipolar 1 disorder   . Depression   . Panic attacks   . Obesity   . Deliberate self-cutting     history of  . DDD (degenerative disc disease), lumbar     and thoracic  . Arthritis     hands  . COPD (chronic obstructive pulmonary disease)     denies SOB with daily activities  . History of seizures     as a child - no seizures since before teenage years; states unknown cause  . Foot ulcer, right 11/02/2013  . Peripheral neuropathy   . Non-restorable tooth 10/2013    multiple    No current facility-administered medications for this encounter.   Current Outpatient Prescriptions  Medication Sig Dispense Refill  . albuterol (PROVENTIL HFA;VENTOLIN HFA) 108 (90 BASE) MCG/ACT inhaler Inhale into the lungs every 6 (six) hours as needed for wheezing or shortness of breath.      . ARIPiprazole (ABILIFY) 5 MG tablet Take 5 mg by mouth daily.      . citalopram (CELEXA) 20 MG tablet Take 20 mg by mouth daily.      Marland Kitchen gabapentin (NEURONTIN) 300 MG capsule Take 600 mg by mouth 2 (two) times daily.       . traZODone (DESYREL) 50 MG tablet Take 50 mg by mouth at bedtime.       Allergies  Allergen Reactions  . Darvocet [Propoxyphene-Acetaminophen] Hives and Itching  . Tramadol Hives and Itching   Active Problems:   * No active hospital problems. *  Vitals: Height 6\' 2"  (1.88 m), weight 133.358 kg (294 lb). Lab results:No results found for this or any previous visit (from the past 71 hour(s)). Radiology Results: No results found. General appearance: alert, cooperative and moderately obese Head: Normocephalic, without obvious abnormality, atraumatic Eyes: negative Ears: normal TM's and external ear canals both ears Nose: Nares normal. Septum midline. Mucosa normal. No drainage or sinus tenderness. Throat: dental caries teeth #'s 2, 4,  10,11, 12, 13, 15, 16, 17, 18, 19, 20, 28, 29, 30, 31 Neck: no adenopathy, supple, symmetrical, trachea midline and thyroid not enlarged, symmetric, no tenderness/mass/nodules Resp: clear to auscultation bilaterally Cardio: regular rate and rhythm, S1, S2 normal, no murmur, click, rub or gallop  Assessment: 37 yo WM Obesity, COPD, Bipolar, Panic attacks, DDD with non-restorable teeth #'s 2, 4, 10,11, 12, 13, 15, 16, 17, 18, 19, 20, 28, 29, 30, 31.  Plan: Extraction teeth #'s 2, 4, 10,11, 12, 13, 15, 16, 17, 18, 19, 20, 28, 29, 30, 31, alveoloplasty. General anesthesia. Day surgery.   Gae Bon 11/05/2013

## 2013-11-09 ENCOUNTER — Ambulatory Visit (HOSPITAL_BASED_OUTPATIENT_CLINIC_OR_DEPARTMENT_OTHER): Admission: RE | Admit: 2013-11-09 | Payer: Medicaid Other | Source: Ambulatory Visit | Admitting: Oral Surgery

## 2013-11-09 HISTORY — DX: Polyneuropathy, unspecified: G62.9

## 2013-11-09 HISTORY — DX: Non-pressure chronic ulcer of other part of right foot with unspecified severity: L97.519

## 2013-11-09 HISTORY — DX: Other specified disorders of teeth and supporting structures: K08.89

## 2013-11-09 HISTORY — DX: Personal history of other specified conditions: Z87.898

## 2013-11-09 HISTORY — DX: Chronic obstructive pulmonary disease, unspecified: J44.9

## 2013-11-09 HISTORY — DX: Unspecified osteoarthritis, unspecified site: M19.90

## 2013-11-09 SURGERY — MULTIPLE EXTRACTION WITH ALVEOLOPLASTY
Anesthesia: General

## 2013-11-16 ENCOUNTER — Other Ambulatory Visit (HOSPITAL_COMMUNITY): Payer: Self-pay | Admitting: *Deleted

## 2013-11-16 ENCOUNTER — Encounter (HOSPITAL_COMMUNITY): Payer: Self-pay | Admitting: Pharmacy Technician

## 2013-11-16 ENCOUNTER — Encounter (HOSPITAL_COMMUNITY): Payer: Self-pay | Admitting: *Deleted

## 2013-11-16 MED ORDER — CEFAZOLIN SODIUM 10 G IJ SOLR
3.0000 g | INTRAMUSCULAR | Status: AC
  Administered 2013-11-17: 3 g via INTRAVENOUS
  Filled 2013-11-16: qty 3000

## 2013-11-16 MED ORDER — FENTANYL CITRATE 0.05 MG/ML IJ SOLN: 50.0000 ug | INTRAMUSCULAR | Status: DC | PRN

## 2013-11-16 MED ORDER — LACTATED RINGERS IV SOLN: INTRAVENOUS | Status: DC

## 2013-11-16 MED ORDER — MIDAZOLAM HCL 2 MG/2ML IJ SOLN: 1.0000 mg | INTRAMUSCULAR | Status: DC | PRN

## 2013-11-17 ENCOUNTER — Ambulatory Visit (HOSPITAL_COMMUNITY): Payer: Medicaid Other | Admitting: Anesthesiology

## 2013-11-17 ENCOUNTER — Ambulatory Visit (HOSPITAL_COMMUNITY)
Admission: RE | Admit: 2013-11-17 | Discharge: 2013-11-17 | Disposition: A | Discharge status: Home / Self Care | Payer: Medicaid Other | Source: Ambulatory Visit | Attending: Oral Surgery | Admitting: Oral Surgery

## 2013-11-17 ENCOUNTER — Encounter (HOSPITAL_COMMUNITY): Payer: Self-pay

## 2013-11-17 ENCOUNTER — Encounter (HOSPITAL_COMMUNITY): Payer: Medicaid Other | Admitting: Anesthesiology

## 2013-11-17 ENCOUNTER — Encounter (HOSPITAL_COMMUNITY)
Admission: RE | Disposition: A | Discharge status: Home / Self Care | Payer: Self-pay | Source: Ambulatory Visit | Attending: Oral Surgery

## 2013-11-17 DIAGNOSIS — K029 Dental caries, unspecified: Secondary | ICD-10-CM

## 2013-11-17 DIAGNOSIS — J449 Chronic obstructive pulmonary disease, unspecified: Secondary | ICD-10-CM | POA: Insufficient documentation

## 2013-11-17 DIAGNOSIS — E669 Obesity, unspecified: Secondary | ICD-10-CM

## 2013-11-17 DIAGNOSIS — L97509 Non-pressure chronic ulcer of other part of unspecified foot with unspecified severity: Secondary | ICD-10-CM

## 2013-11-17 DIAGNOSIS — J0100 Acute maxillary sinusitis, unspecified: Secondary | ICD-10-CM | POA: Insufficient documentation

## 2013-11-17 DIAGNOSIS — M869 Osteomyelitis, unspecified: Secondary | ICD-10-CM

## 2013-11-17 DIAGNOSIS — R634 Abnormal weight loss: Secondary | ICD-10-CM

## 2013-11-17 DIAGNOSIS — J4489 Other specified chronic obstructive pulmonary disease: Secondary | ICD-10-CM | POA: Insufficient documentation

## 2013-11-17 DIAGNOSIS — K0530 Chronic periodontitis, unspecified: Secondary | ICD-10-CM

## 2013-11-17 DIAGNOSIS — K089 Disorder of teeth and supporting structures, unspecified: Secondary | ICD-10-CM | POA: Insufficient documentation

## 2013-11-17 DIAGNOSIS — F17200 Nicotine dependence, unspecified, uncomplicated: Secondary | ICD-10-CM | POA: Insufficient documentation

## 2013-11-17 DIAGNOSIS — Z720 Tobacco use: Secondary | ICD-10-CM

## 2013-11-17 HISTORY — PX: MULTIPLE EXTRACTIONS WITH ALVEOLOPLASTY: SHX5342

## 2013-11-17 HISTORY — DX: Osteomyelitis, unspecified: M86.9

## 2013-11-17 LAB — CBC
HCT: 44 % (ref 39.0–52.0)
Hemoglobin: 15.7 g/dL (ref 13.0–17.0)
MCHC: 35.7 g/dL (ref 30.0–36.0)
MCV: 88.2 fL (ref 78.0–100.0)
Platelets: 202 10*3/uL (ref 150–400)
RBC: 4.99 MIL/uL (ref 4.22–5.81)
WBC: 12.3 10*3/uL — ABNORMAL HIGH (ref 4.0–10.5)

## 2013-11-17 LAB — BASIC METABOLIC PANEL
CO2: 22 mEq/L (ref 19–32)
Calcium: 8.8 mg/dL (ref 8.4–10.5)
Chloride: 106 mEq/L (ref 96–112)
Potassium: 3.9 mEq/L (ref 3.5–5.1)
Sodium: 140 mEq/L (ref 135–145)

## 2013-11-17 SURGERY — MULTIPLE EXTRACTION WITH ALVEOLOPLASTY
Anesthesia: General | Site: Mouth

## 2013-11-17 MED ORDER — ONDANSETRON HCL 4 MG/2ML IJ SOLN: 4.0000 mg | Freq: Once | INTRAMUSCULAR | Status: DC | PRN

## 2013-11-17 MED ORDER — OXYCODONE HCL 5 MG/5ML PO SOLN: 5.0000 mg | Freq: Once | ORAL | Status: DC | PRN

## 2013-11-17 MED ORDER — PROPOFOL 10 MG/ML IV BOLUS
INTRAVENOUS | Status: DC | PRN
  Administered 2013-11-17: 150 mg via INTRAVENOUS

## 2013-11-17 MED ORDER — HYDROMORPHONE HCL PF 1 MG/ML IJ SOLN
0.2500 mg | INTRAMUSCULAR | Status: DC | PRN
  Administered 2013-11-17 (×2): 0.5 mg via INTRAVENOUS

## 2013-11-17 MED ORDER — MIDAZOLAM HCL 5 MG/5ML IJ SOLN
INTRAMUSCULAR | Status: DC | PRN
  Administered 2013-11-17: 2 mg via INTRAVENOUS

## 2013-11-17 MED ORDER — MEPERIDINE HCL 25 MG/ML IJ SOLN: 6.2500 mg | INTRAMUSCULAR | Status: DC | PRN

## 2013-11-17 MED ORDER — FENTANYL CITRATE 0.05 MG/ML IJ SOLN
INTRAMUSCULAR | Status: DC | PRN
  Administered 2013-11-17: 50 ug via INTRAVENOUS
  Administered 2013-11-17: 150 ug via INTRAVENOUS

## 2013-11-17 MED ORDER — LORATADINE-PSEUDOEPHEDRINE ER 10-240 MG PO TB24: 1.0000 | ORAL_TABLET | Freq: Every day | ORAL | Status: DC

## 2013-11-17 MED ORDER — AMOXICILLIN 500 MG PO CAPS: 500.0000 mg | ORAL_CAPSULE | Freq: Four times a day (QID) | ORAL | Status: DC

## 2013-11-17 MED ORDER — OXYMETAZOLINE HCL 0.05 % NA SOLN
NASAL | Status: AC
  Filled 2013-11-17: qty 15

## 2013-11-17 MED ORDER — OXYCODONE HCL 5 MG PO TABS: 5.0000 mg | ORAL_TABLET | Freq: Once | ORAL | Status: DC | PRN

## 2013-11-17 MED ORDER — LACTATED RINGERS IV SOLN
INTRAVENOUS | Status: DC
  Administered 2013-11-17: 08:00:00 via INTRAVENOUS

## 2013-11-17 MED ORDER — DEXAMETHASONE SODIUM PHOSPHATE 4 MG/ML IJ SOLN
INTRAMUSCULAR | Status: DC | PRN
  Administered 2013-11-17: 4 mg via INTRAVENOUS

## 2013-11-17 MED ORDER — OXYCODONE-ACETAMINOPHEN 5-325 MG PO TABS: 1.0000 | ORAL_TABLET | ORAL | Status: DC | PRN

## 2013-11-17 MED ORDER — SODIUM CHLORIDE 0.9 % IR SOLN
Status: DC | PRN
  Administered 2013-11-17: 1000 mL

## 2013-11-17 MED ORDER — ONDANSETRON HCL 4 MG/2ML IJ SOLN
INTRAMUSCULAR | Status: DC | PRN
  Administered 2013-11-17: 4 mg via INTRAVENOUS

## 2013-11-17 MED ORDER — HYDROMORPHONE HCL PF 1 MG/ML IJ SOLN
INTRAMUSCULAR | Status: AC
  Filled 2013-11-17: qty 1

## 2013-11-17 MED ORDER — LIDOCAINE HCL (CARDIAC) 20 MG/ML IV SOLN
INTRAVENOUS | Status: DC | PRN
  Administered 2013-11-17: 100 mg via INTRAVENOUS

## 2013-11-17 MED ORDER — LIDOCAINE-EPINEPHRINE 2 %-1:100000 IJ SOLN
INTRAMUSCULAR | Status: DC | PRN
  Administered 2013-11-17: 13 mL via INTRADERMAL

## 2013-11-17 MED ORDER — LIDOCAINE-EPINEPHRINE 2 %-1:100000 IJ SOLN
INTRAMUSCULAR | Status: AC
  Filled 2013-11-17: qty 1

## 2013-11-17 MED ORDER — SUCCINYLCHOLINE CHLORIDE 20 MG/ML IJ SOLN
INTRAMUSCULAR | Status: DC | PRN
  Administered 2013-11-17: 120 mg via INTRAVENOUS

## 2013-11-17 SURGICAL SUPPLY — 29 items
BUR CROSS CUT FISSURE 1.6 (BURR) ×3 IMPLANT
BUR EGG ELITE 4.0 (BURR) ×1 IMPLANT
CANISTER SUCTION 2500CC (MISCELLANEOUS) ×2 IMPLANT
CLOTH BEACON ORANGE TIMEOUT ST (SAFETY) ×2 IMPLANT
COVER SURGICAL LIGHT HANDLE (MISCELLANEOUS) ×2 IMPLANT
CRADLE DONUT ADULT HEAD (MISCELLANEOUS) ×2 IMPLANT
DECANTER SPIKE VIAL GLASS SM (MISCELLANEOUS) ×2 IMPLANT
GAUZE PACKING FOLDED 2  STR (GAUZE/BANDAGES/DRESSINGS) ×1
GAUZE PACKING FOLDED 2 STR (GAUZE/BANDAGES/DRESSINGS) ×1 IMPLANT
GLOVE BIO SURGEON STRL SZ 6.5 (GLOVE) ×3 IMPLANT
GLOVE BIO SURGEON STRL SZ7.5 (GLOVE) ×2 IMPLANT
GLOVE BIOGEL PI IND STRL 6.5 (GLOVE) IMPLANT
GLOVE BIOGEL PI IND STRL 7.0 (GLOVE) ×1 IMPLANT
GLOVE BIOGEL PI INDICATOR 6.5 (GLOVE) ×1
GLOVE BIOGEL PI INDICATOR 7.0 (GLOVE) ×1
GOWN STRL NON-REIN LRG LVL3 (GOWN DISPOSABLE) ×3 IMPLANT
GOWN STRL REIN XL XLG (GOWN DISPOSABLE) ×2 IMPLANT
KIT BASIN OR (CUSTOM PROCEDURE TRAY) ×2 IMPLANT
KIT ROOM TURNOVER OR (KITS) ×2 IMPLANT
NEEDLE 22X1 1/2 (OR ONLY) (NEEDLE) ×2 IMPLANT
NS IRRIG 1000ML POUR BTL (IV SOLUTION) ×2 IMPLANT
PAD ARMBOARD 7.5X6 YLW CONV (MISCELLANEOUS) ×4 IMPLANT
SUT CHROMIC 3 0 PS 2 (SUTURE) ×2 IMPLANT
SYR CONTROL 10ML LL (SYRINGE) ×2 IMPLANT
TOWEL OR 17X26 10 PK STRL BLUE (TOWEL DISPOSABLE) ×2 IMPLANT
TRAY ENT MC OR (CUSTOM PROCEDURE TRAY) ×2 IMPLANT
TUBING IRRIGATION (MISCELLANEOUS) IMPLANT
WATER STERILE IRR 1000ML POUR (IV SOLUTION) IMPLANT
YANKAUER SUCT BULB TIP NO VENT (SUCTIONS) ×2 IMPLANT

## 2013-11-17 NOTE — Transfer of Care (Signed)
Immediate Anesthesia Transfer of Care Note  Patient: Hunter Reyes  Procedure(s) Performed: Procedure(s): MULTIPLE EXTRACTION WITH ALVEOLOPLASTY (N/A)  Patient Location: PACU  Anesthesia Type:General  Level of Consciousness: awake, alert  and oriented  Airway & Oxygen Therapy: Patient Spontanous Breathing and Patient connected to nasal cannula oxygen  Post-op Assessment: Report given to PACU RN, Post -op Vital signs reviewed and stable and Patient moving all extremities X 4  Post vital signs: Reviewed and stable  Complications: No apparent anesthesia complications

## 2013-11-17 NOTE — Preoperative (Signed)
Beta Blockers   Reason not to administer Beta Blockers:Not Applicable 

## 2013-11-17 NOTE — Anesthesia Postprocedure Evaluation (Signed)
Anesthesia Post Note  Patient: Hunter Reyes  Procedure(s) Performed: Procedure(s) (LRB): MULTIPLE EXTRACTION WITH ALVEOLOPLASTY (N/A)  Anesthesia type: general  Patient location: PACU  Post pain: Pain level controlled  Post assessment: Patient's Cardiovascular Status Stable  Last Vitals:  Filed Vitals:   11/17/13 1133  BP:   Pulse:   Temp: 36.4 C  Resp:     Post vital signs: Reviewed and stable  Level of consciousness: sedated  Complications: No apparent anesthesia complications

## 2013-11-17 NOTE — Op Note (Signed)
NAMEEFSTATHIOS, SCHLAGETER             ACCOUNT NO.:  [id number]  MEDICAL RECORD NO.:  [id number]  LOCATION:  [facility]                         FACILITY:  [facility]  PHYSICIAN:  Gae Bon, M.D.  DATE OF BIRTH:  1976-02-04  DATE OF PROCEDURE:  11/17/2013 DATE OF DISCHARGE:  11/17/2013                              OPERATIVE REPORT   ADDENDUM:  POSTOPERATIVE DIAGNOSIS:  Right maxillary oroantral communication.  PROCEDURE:  Closure of right maxillary antral oral communication.  DESCRIPTION OF PROCEDURE:  After the maxillary teeth were removed, there was approximately 5-mm opening in the area of tooth #2 that communicated with the maxillary sinus.  The membrane was not intact and therefore, the decision was made to close this primarily.  A distal wedge flap was created and rotated to cover the antral closure and then, the remaining mucosa was closed over this wedge using 3-0 chromic.  The patient was then irrigated and the operation complete.  The patient was then suctioned and throat pack removed and the patient was taken to recovery room.  Postoperative sinus instructions were given including no nose blowing and sneezing with the mouth open.     Gae Bon, M.D.     SMJ/MEDQ  D:  11/17/2013  T:  11/17/2013  Job:  [id number]

## 2013-11-17 NOTE — Progress Notes (Signed)
Report given to philip rn as caregiver 

## 2013-11-17 NOTE — Op Note (Signed)
11/17/2013  10:18 AM  PATIENT:  Lou Miner  37 y.o. male  PRE-OPERATIVE DIAGNOSIS:  non restorable teeth # 2, 4, 10, 11, 12, 13, 15, 16, 17, 18, 19, 20, 28, 29, 30, 31  POST-OPERATIVE DIAGNOSIS:  SAME  PROCEDURE:  Procedure(s): MULTIPLE EXTRACTION  teeth # 2, 4, 10, 11, 12, 13, 15, 16, 17, 18, 19, 20, 28, 29, 30, 31  SURGEON:  Surgeon(s): Gae Bon, DDS  ANESTHESIA:   local and general  EBL:  minimal  DRAINS: none   SPECIMEN:  No Specimen  COUNTS:  YES  PLAN OF CARE: Discharge to home after PACU  PATIENT DISPOSITION:  PACU - hemodynamically stable.   PROCEDURE DETAILS: Dictation [id number]  Gae Bon, DMD 11/17/2013 10:18 AM

## 2013-11-17 NOTE — Anesthesia Procedure Notes (Signed)
Procedure Name: Intubation Date/Time: 11/17/2013 9:37 AM Performed by: Carola Frost Pre-anesthesia Checklist: Patient identified, Timeout performed, Emergency Drugs available, Patient being monitored and Suction available Patient Re-evaluated:Patient Re-evaluated prior to inductionOxygen Delivery Method: Circle system utilized Preoxygenation: Pre-oxygenation with 100% oxygen Intubation Type: IV induction Ventilation: Mask ventilation without difficulty Laryngoscope Size: Mac and 4 Grade View: Grade III Nasal Tubes: Left, Nasal prep performed, Magill forceps- large, utilized and Nasal Rae Tube size: 7.5 mm Number of attempts: 1 Airway Equipment and Method: Bougie stylet Placement Confirmation: ETT inserted through vocal cords under direct vision,  positive ETCO2 and breath sounds checked- equal and bilateral Tube secured with: Tape Dental Injury: Teeth and Oropharynx as per pre-operative assessment  Comments: DLx1 with MAC 4.  Grade 3 view.  7.5 nasal tube inserted with ease.  Cuff no longer working once placed.  Nasal tube exchanged with bougie.  Placement confirmed

## 2013-11-17 NOTE — Op Note (Signed)
NAME:  Hunter Reyes, Hunter Reyes             ACCOUNT NO.:  [id number]  MEDICAL RECORD NO.:  [id number]  LOCATION:  [facility]                         FACILITY:  [place]  PHYSICIAN:  Gae Bon, M.D.  DATE OF BIRTH:  1976/01/08  DATE OF PROCEDURE: DATE OF DISCHARGE:                              OPERATIVE REPORT   PREOPERATIVE DIAGNOSIS:  Non-restorable teeth #2, 4, 10, 11, 12, 13, 15, 16, 17, 18, 19, 20, 28, 29, 30, 31.  POSTOPERATIVE DIAGNOSIS:  Non-restorable teeth #2, 4, 10, 11, 12, 13, 15, 16, 17, 18, 19, 20, 28, 29, 30, 31.  PROCEDURE:  Extraction of teeth #2, 4, 10, 11, 12, 13, 15, 16, 17, 18, 19, 20, 28, 29, 30, 31.  SURGEONS:  Gae Bon, M.D.  ANESTHESIA:  General, nasal intubation, Dr. Conrad Tidioute, attending.  PROCEDURE:  The patient was taken to the operating room, placed on the table in supine position.  General anesthesia was administered intravenously and a nasal endotracheal tube was placed and secured.  The eyes were protected.  Time-out was performed.  The patient was draped for the procedure and the posterior pharynx was suctioned.  The throat pack was placed.  A 2% lidocaine 1:100,000 epinephrine was infiltrated in an inferior alveolar block on the right and left side and buccal and palatal infiltration around the teeth to be removed, total of 14 mL was utilized.  Sweetheart retractor and a bite block were placed in the mouth and attention was turned to the left side.  A 15 blade was used to make an incision around teeth #17, 18, 19, 20 in the mandible and around teeth #10, 11, 12, 13, 15 and 16 in the maxilla.  The periosteum was reflected from around these teeth.  The teeth were elevated with 301 elevator and removed from the mouth with the lower forceps in the mandible and the upper forceps in the maxilla.  The sockets were then curetted, irrigated, and closed with 3-0 chromic.  The Sweetheart retractor and bite block were repositioned to the other side of the mouth  and a 15 blade was used to make an incision around teeth #28, 29, 30, 31 and around 2 and 4.  The periosteum was reflected with a periosteal elevator.  The teeth were then elevated with a 301 elevator and removed from the mouth with the lower universal forceps in the mandible and the upper universal forceps in the maxilla.  Tooth #4 required additional removal of bone with the Stryker handpiece under irrigation.  Then, the tooth was removed with a 301 elevator and rongeurs.  The sockets on the right side were then curetted, irrigated, and closed with 3-0 chromic.  The oral cavity was then inspected, irrigated, and suctioned and the throat pack was removed.  The patient was awakened, extubated, taken to the recovery room, breathing spontaneously in good condition.  ESTIMATED BLOOD LOSS:  Minimum.  COMPLICATIONS:  None.  SPECIMENS:  None.     Gae Bon, M.D.     SMJ/MEDQ  D:  11/17/2013  T:  11/17/2013  Job:  [id number]

## 2013-11-17 NOTE — Anesthesia Preprocedure Evaluation (Addendum)
Anesthesia Evaluation  Patient identified by MRN, date of birth, ID band Patient awake    Reviewed: Allergy & Precautions, H&P , NPO status , Patient's Chart, lab work & pertinent test results  Airway Mallampati: I TM Distance: >3 FB Neck ROM: Full    Dental  (+) Dental Advisory Given   Pulmonary COPDCurrent Smoker,          Cardiovascular     Neuro/Psych PSYCHIATRIC DISORDERS Anxiety Depression    GI/Hepatic negative GI ROS, Neg liver ROS,   Endo/Other    Renal/GU negative Renal ROS     Musculoskeletal   Abdominal   Peds  Hematology   Anesthesia Other Findings   Reproductive/Obstetrics                         Anesthesia Physical Anesthesia Plan  ASA: III  Anesthesia Plan: General   Post-op Pain Management:    Induction: Intravenous  Airway Management Planned: Nasal ETT  Additional Equipment:   Intra-op Plan:   Post-operative Plan: Extubation in OR  Informed Consent: I have reviewed the patients History and Physical, chart, labs and discussed the procedure including the risks, benefits and alternatives for the proposed anesthesia with the patient or authorized representative who has indicated his/her understanding and acceptance.   Dental advisory given  Plan Discussed with: Surgeon, CRNA and Anesthesiologist  Anesthesia Plan Comments:       Anesthesia Quick Evaluation

## 2013-11-17 NOTE — H&P (Signed)
H&P documentation  -History and Physical Reviewed  -Patient has been re-examined  -No change in the plan of care  Hunter Reyes  

## 2013-11-18 ENCOUNTER — Encounter (HOSPITAL_COMMUNITY): Payer: Self-pay | Admitting: Oral Surgery

## 2013-11-25 ENCOUNTER — Encounter (HOSPITAL_BASED_OUTPATIENT_CLINIC_OR_DEPARTMENT_OTHER): Payer: Medicaid Other | Attending: General Surgery

## 2013-11-25 DIAGNOSIS — L97409 Non-pressure chronic ulcer of unspecified heel and midfoot with unspecified severity: Secondary | ICD-10-CM | POA: Insufficient documentation

## 2013-11-25 DIAGNOSIS — E1069 Type 1 diabetes mellitus with other specified complication: Secondary | ICD-10-CM | POA: Insufficient documentation

## 2013-11-25 DIAGNOSIS — L84 Corns and callosities: Secondary | ICD-10-CM | POA: Insufficient documentation

## 2013-11-25 DIAGNOSIS — M9080 Osteopathy in diseases classified elsewhere, unspecified site: Secondary | ICD-10-CM | POA: Insufficient documentation

## 2013-11-25 DIAGNOSIS — A4901 Methicillin susceptible Staphylococcus aureus infection, unspecified site: Secondary | ICD-10-CM | POA: Insufficient documentation

## 2013-11-25 DIAGNOSIS — M86679 Other chronic osteomyelitis, unspecified ankle and foot: Secondary | ICD-10-CM | POA: Insufficient documentation

## 2013-12-10 ENCOUNTER — Ambulatory Visit (HOSPITAL_COMMUNITY)
Admission: RE | Admit: 2013-12-10 | Discharge: 2013-12-10 | Disposition: A | Discharge status: Home / Self Care | Payer: Medicaid Other | Source: Ambulatory Visit | Attending: General Surgery | Admitting: General Surgery

## 2013-12-10 ENCOUNTER — Other Ambulatory Visit (HOSPITAL_COMMUNITY): Payer: Self-pay | Admitting: General Surgery

## 2013-12-10 DIAGNOSIS — T148XXA Other injury of unspecified body region, initial encounter: Secondary | ICD-10-CM

## 2013-12-10 DIAGNOSIS — Z01818 Encounter for other preprocedural examination: Secondary | ICD-10-CM | POA: Insufficient documentation

## 2013-12-10 DIAGNOSIS — E109 Type 1 diabetes mellitus without complications: Secondary | ICD-10-CM | POA: Insufficient documentation

## 2013-12-10 DIAGNOSIS — M86679 Other chronic osteomyelitis, unspecified ankle and foot: Secondary | ICD-10-CM | POA: Insufficient documentation

## 2013-12-10 DIAGNOSIS — A4901 Methicillin susceptible Staphylococcus aureus infection, unspecified site: Secondary | ICD-10-CM | POA: Insufficient documentation

## 2013-12-11 NOTE — Progress Notes (Signed)
[facility]  NAME:  Hunter Reyes, Hunter Reyes             ACCOUNT NO.:  [id number]  MEDICAL RECORD NO.:  [id number]      DATE OF BIRTH:  March 28, 1976  PHYSICIAN:  Ricard Dillon, M.D.      VISIT DATE:                                  OFFICE VISIT   HISTORY OF PRESENT ILLNESS:  Hunter Reyes is a 37 year old type 1 diabetic, who has had a difficult area on his right plantar foot over the 3rd metatarsal head.  He had previously been diagnosed with underlying osteomyelitis.  An MRI of the foot on May 8 showing underlying osseous and soft tissue infection.  He was seen at that time by Infectious Disease and underwent a prolonged course of antibiotics. At that time, I do not think a specific organism was cultured. Fortunately with offloading wound care and antibiotics, Hunter closed over.  We had been following him for an area on his right 1st toe.  Last week, it became apparent that the original area over the right 3rd metatarsal head had reopened.  Debridement here showed purulent drainage which is cultured once again, methicillin-sensitive Staph aureus.  He is being seen today in follow up of Hunter.  On examination, the right 1st toe over the PIP joint is again debrided off surface eschar; however, the area cleans up fairly nicely.  Hunter required direct pressure and silver nitrate to control hemorrhage.  The real problem here is to come over the 3rd metatarsal head.  An extensive surgical debridement was done, which approaches bone before viable tissue was obtained.  The area again is very close in position to the underlying 3rd metatarsal head.  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  Culture of Hunter has shown methicillin-sensitive Staph aureus, he has been started on a first generation cephalosporin.  I think Hunter Reyes should receive consideration for hyperbaric oxygen therapy along with  antibiotics, wound care, and offloading to see if his foot can be salvaged.  Hunter is currently a limb threatening situation.  He will be followed up next week.          ______________________________ Ricard Dillon, M.D.     MGR/MEDQ  D:  12/10/2013  T:  12/11/2013  Job:  [id number]

## 2013-12-31 ENCOUNTER — Encounter (HOSPITAL_BASED_OUTPATIENT_CLINIC_OR_DEPARTMENT_OTHER): Payer: Medicaid Other | Attending: Internal Medicine

## 2013-12-31 DIAGNOSIS — G5790 Unspecified mononeuropathy of unspecified lower limb: Secondary | ICD-10-CM | POA: Insufficient documentation

## 2013-12-31 DIAGNOSIS — M86679 Other chronic osteomyelitis, unspecified ankle and foot: Secondary | ICD-10-CM | POA: Insufficient documentation

## 2013-12-31 DIAGNOSIS — L97509 Non-pressure chronic ulcer of other part of unspecified foot with unspecified severity: Secondary | ICD-10-CM | POA: Insufficient documentation

## 2013-12-31 DIAGNOSIS — L84 Corns and callosities: Secondary | ICD-10-CM | POA: Insufficient documentation

## 2014-01-25 ENCOUNTER — Encounter (HOSPITAL_BASED_OUTPATIENT_CLINIC_OR_DEPARTMENT_OTHER): Payer: Medicaid Other | Attending: Internal Medicine

## 2014-01-25 DIAGNOSIS — L97509 Non-pressure chronic ulcer of other part of unspecified foot with unspecified severity: Secondary | ICD-10-CM | POA: Insufficient documentation

## 2014-01-25 DIAGNOSIS — M86679 Other chronic osteomyelitis, unspecified ankle and foot: Secondary | ICD-10-CM | POA: Insufficient documentation

## 2014-01-25 DIAGNOSIS — G5790 Unspecified mononeuropathy of unspecified lower limb: Secondary | ICD-10-CM | POA: Insufficient documentation

## 2014-01-25 DIAGNOSIS — L84 Corns and callosities: Secondary | ICD-10-CM | POA: Insufficient documentation

## 2014-02-22 ENCOUNTER — Encounter (HOSPITAL_BASED_OUTPATIENT_CLINIC_OR_DEPARTMENT_OTHER): Payer: Medicaid Other | Attending: Internal Medicine

## 2014-02-22 DIAGNOSIS — L97509 Non-pressure chronic ulcer of other part of unspecified foot with unspecified severity: Secondary | ICD-10-CM | POA: Insufficient documentation

## 2014-02-22 DIAGNOSIS — L84 Corns and callosities: Secondary | ICD-10-CM | POA: Insufficient documentation

## 2014-02-22 DIAGNOSIS — G5790 Unspecified mononeuropathy of unspecified lower limb: Secondary | ICD-10-CM | POA: Insufficient documentation

## 2014-02-22 DIAGNOSIS — M869 Osteomyelitis, unspecified: Secondary | ICD-10-CM | POA: Insufficient documentation

## 2014-03-04 ENCOUNTER — Other Ambulatory Visit (HOSPITAL_BASED_OUTPATIENT_CLINIC_OR_DEPARTMENT_OTHER): Payer: Self-pay | Admitting: Internal Medicine

## 2014-03-04 DIAGNOSIS — M722 Plantar fascial fibromatosis: Secondary | ICD-10-CM

## 2014-03-17 ENCOUNTER — Ambulatory Visit (HOSPITAL_COMMUNITY)
Admission: RE | Admit: 2014-03-17 | Discharge: 2014-03-17 | Disposition: A | Discharge status: Home / Self Care | Payer: Medicaid Other | Source: Ambulatory Visit | Attending: Internal Medicine | Admitting: Internal Medicine

## 2014-03-17 DIAGNOSIS — M722 Plantar fascial fibromatosis: Secondary | ICD-10-CM

## 2014-03-17 DIAGNOSIS — M7989 Other specified soft tissue disorders: Secondary | ICD-10-CM | POA: Insufficient documentation

## 2014-03-17 DIAGNOSIS — M2060 Acquired deformities of toe(s), unspecified, unspecified foot: Secondary | ICD-10-CM | POA: Insufficient documentation

## 2014-03-17 DIAGNOSIS — X58XXXA Exposure to other specified factors, initial encounter: Secondary | ICD-10-CM | POA: Insufficient documentation

## 2014-03-17 MED ORDER — GADOBENATE DIMEGLUMINE 529 MG/ML IV SOLN
20.0000 mL | Freq: Once | INTRAVENOUS | Status: AC | PRN
  Administered 2014-03-17: 20 mL via INTRAVENOUS

## 2014-03-24 ENCOUNTER — Encounter (HOSPITAL_BASED_OUTPATIENT_CLINIC_OR_DEPARTMENT_OTHER): Payer: Medicaid Other | Attending: Internal Medicine

## 2014-03-24 DIAGNOSIS — M86679 Other chronic osteomyelitis, unspecified ankle and foot: Secondary | ICD-10-CM | POA: Insufficient documentation

## 2014-03-24 DIAGNOSIS — L97509 Non-pressure chronic ulcer of other part of unspecified foot with unspecified severity: Secondary | ICD-10-CM | POA: Insufficient documentation

## 2014-03-24 DIAGNOSIS — L84 Corns and callosities: Secondary | ICD-10-CM | POA: Insufficient documentation

## 2014-03-24 DIAGNOSIS — G5790 Unspecified mononeuropathy of unspecified lower limb: Secondary | ICD-10-CM | POA: Insufficient documentation

## 2014-03-25 LAB — GLUCOSE, CAPILLARY: Glucose-Capillary: 165 mg/dL — ABNORMAL HIGH (ref 70–99)

## 2014-04-08 LAB — GLUCOSE, CAPILLARY: GLUCOSE-CAPILLARY: 140 mg/dL — AB (ref 70–99)

## 2014-04-29 ENCOUNTER — Encounter (HOSPITAL_BASED_OUTPATIENT_CLINIC_OR_DEPARTMENT_OTHER): Payer: Medicaid Other | Attending: Internal Medicine

## 2014-04-29 DIAGNOSIS — M869 Osteomyelitis, unspecified: Secondary | ICD-10-CM | POA: Insufficient documentation

## 2014-04-29 DIAGNOSIS — L84 Corns and callosities: Secondary | ICD-10-CM | POA: Insufficient documentation

## 2014-04-29 DIAGNOSIS — G609 Hereditary and idiopathic neuropathy, unspecified: Secondary | ICD-10-CM | POA: Insufficient documentation

## 2014-04-29 DIAGNOSIS — L97509 Non-pressure chronic ulcer of other part of unspecified foot with unspecified severity: Secondary | ICD-10-CM | POA: Insufficient documentation

## 2014-05-27 ENCOUNTER — Encounter (HOSPITAL_BASED_OUTPATIENT_CLINIC_OR_DEPARTMENT_OTHER): Payer: Medicaid Other | Attending: Internal Medicine

## 2014-05-27 DIAGNOSIS — M869 Osteomyelitis, unspecified: Secondary | ICD-10-CM | POA: Insufficient documentation

## 2014-05-27 DIAGNOSIS — L84 Corns and callosities: Secondary | ICD-10-CM | POA: Insufficient documentation

## 2014-05-27 DIAGNOSIS — L97509 Non-pressure chronic ulcer of other part of unspecified foot with unspecified severity: Secondary | ICD-10-CM | POA: Insufficient documentation

## 2014-05-27 DIAGNOSIS — G5790 Unspecified mononeuropathy of unspecified lower limb: Secondary | ICD-10-CM | POA: Insufficient documentation

## 2014-09-17 ENCOUNTER — Encounter (HOSPITAL_BASED_OUTPATIENT_CLINIC_OR_DEPARTMENT_OTHER): Payer: Medicaid Other | Attending: General Surgery

## 2014-09-17 DIAGNOSIS — L97409 Non-pressure chronic ulcer of unspecified heel and midfoot with unspecified severity: Secondary | ICD-10-CM | POA: Diagnosis not present

## 2014-09-18 NOTE — Progress Notes (Signed)
[facility]  NAME:  ANN, KINDSCHI             ACCOUNT NO.:  [id number]  MEDICAL RECORD NO.:  [id number]      DATE OF BIRTH:  [birth year]  PHYSICIAN:  Judene Companion, M.D.           VISIT DATE:                                  OFFICE VISIT   Hunter Reyes is a 38 year old gentleman who has been a patient here many times in the past.  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  Today, he was here with three areas on the right foot and one area on the left foot that I debrided of a great deal of callus right down to these superficial ulcers that I am calling neuropathic ulcers just like what he has had in the past.  His blood pressure is 103/74, temperature 98.  This gentleman is quite heavy.  He weighs 300 pounds.  His medicines are only Aleve that he takes for discomfort.  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  He has had no surgery and he is in reasonable good health except for these neuropathic ulcers that have plagued him for years. Today, I debrided all the callus off and got them down to nice flat surfaces with superficial ulcers and we treated him with offloading felt areas and I put silver alginate on the ulcers and he will return here in a week.  So, his diagnosis is neuropathic ulcers, plantar aspect, bilateral feet.     Judene Companion, M.D.     PP/MEDQ  D:  09/17/2014  T:  09/18/2014  Job:  [id number]

## 2014-09-24 ENCOUNTER — Encounter (HOSPITAL_BASED_OUTPATIENT_CLINIC_OR_DEPARTMENT_OTHER): Payer: Medicaid Other | Attending: General Surgery

## 2014-09-24 DIAGNOSIS — L97419 Non-pressure chronic ulcer of right heel and midfoot with unspecified severity: Secondary | ICD-10-CM | POA: Insufficient documentation

## 2014-09-24 DIAGNOSIS — G629 Polyneuropathy, unspecified: Secondary | ICD-10-CM | POA: Insufficient documentation

## 2014-10-01 DIAGNOSIS — L97419 Non-pressure chronic ulcer of right heel and midfoot with unspecified severity: Secondary | ICD-10-CM | POA: Diagnosis not present

## 2014-10-01 DIAGNOSIS — G629 Polyneuropathy, unspecified: Secondary | ICD-10-CM | POA: Diagnosis not present

## 2014-10-04 ENCOUNTER — Emergency Department (HOSPITAL_COMMUNITY)
Admission: EM | Admit: 2014-10-04 | Discharge: 2014-10-04 | Disposition: A | Discharge status: Home / Self Care | Payer: Medicaid Other | Attending: Emergency Medicine | Admitting: Emergency Medicine

## 2014-10-04 ENCOUNTER — Emergency Department (HOSPITAL_COMMUNITY): Payer: Medicaid Other

## 2014-10-04 ENCOUNTER — Encounter (HOSPITAL_COMMUNITY): Payer: Self-pay | Admitting: Emergency Medicine

## 2014-10-04 DIAGNOSIS — G629 Polyneuropathy, unspecified: Secondary | ICD-10-CM | POA: Insufficient documentation

## 2014-10-04 DIAGNOSIS — H6592 Unspecified nonsuppurative otitis media, left ear: Secondary | ICD-10-CM | POA: Diagnosis not present

## 2014-10-04 DIAGNOSIS — F410 Panic disorder [episodic paroxysmal anxiety] without agoraphobia: Secondary | ICD-10-CM | POA: Diagnosis not present

## 2014-10-04 DIAGNOSIS — Z872 Personal history of diseases of the skin and subcutaneous tissue: Secondary | ICD-10-CM | POA: Diagnosis not present

## 2014-10-04 DIAGNOSIS — W1839XA Other fall on same level, initial encounter: Secondary | ICD-10-CM | POA: Diagnosis not present

## 2014-10-04 DIAGNOSIS — Z9889 Other specified postprocedural states: Secondary | ICD-10-CM | POA: Insufficient documentation

## 2014-10-04 DIAGNOSIS — R51 Headache: Secondary | ICD-10-CM | POA: Diagnosis present

## 2014-10-04 DIAGNOSIS — S39012A Strain of muscle, fascia and tendon of lower back, initial encounter: Secondary | ICD-10-CM | POA: Diagnosis not present

## 2014-10-04 DIAGNOSIS — R059 Cough, unspecified: Secondary | ICD-10-CM

## 2014-10-04 DIAGNOSIS — Y9389 Activity, other specified: Secondary | ICD-10-CM | POA: Insufficient documentation

## 2014-10-04 DIAGNOSIS — R0789 Other chest pain: Secondary | ICD-10-CM | POA: Insufficient documentation

## 2014-10-04 DIAGNOSIS — M1990 Unspecified osteoarthritis, unspecified site: Secondary | ICD-10-CM | POA: Insufficient documentation

## 2014-10-04 DIAGNOSIS — E669 Obesity, unspecified: Secondary | ICD-10-CM | POA: Diagnosis not present

## 2014-10-04 DIAGNOSIS — Z79899 Other long term (current) drug therapy: Secondary | ICD-10-CM | POA: Insufficient documentation

## 2014-10-04 DIAGNOSIS — J441 Chronic obstructive pulmonary disease with (acute) exacerbation: Secondary | ICD-10-CM | POA: Diagnosis not present

## 2014-10-04 DIAGNOSIS — F319 Bipolar disorder, unspecified: Secondary | ICD-10-CM | POA: Insufficient documentation

## 2014-10-04 DIAGNOSIS — Z720 Tobacco use: Secondary | ICD-10-CM | POA: Insufficient documentation

## 2014-10-04 DIAGNOSIS — Z8719 Personal history of other diseases of the digestive system: Secondary | ICD-10-CM | POA: Diagnosis not present

## 2014-10-04 DIAGNOSIS — Y929 Unspecified place or not applicable: Secondary | ICD-10-CM | POA: Insufficient documentation

## 2014-10-04 DIAGNOSIS — R05 Cough: Secondary | ICD-10-CM

## 2014-10-04 HISTORY — DX: Non-pressure chronic ulcer of skin of other sites with unspecified severity: L98.499

## 2014-10-04 MED ORDER — PREDNISONE 10 MG PO TABS: ORAL_TABLET | ORAL | Status: DC

## 2014-10-04 MED ORDER — AMOXICILLIN-POT CLAVULANATE 500-125 MG PO TABS: 1.0000 | ORAL_TABLET | Freq: Two times a day (BID) | ORAL | Status: DC

## 2014-10-04 MED ORDER — HYDROCODONE-ACETAMINOPHEN 5-325 MG PO TABS: ORAL_TABLET | ORAL | Status: DC

## 2014-10-04 NOTE — ED Notes (Signed)
Patient complaining of headache, sore throat, and congestion x 1 1/2 weeks.

## 2014-10-04 NOTE — ED Provider Notes (Signed)
CSN: [id number]     Arrival date & time 10/04/14  1756 History  This chart was scribed for non-physician practitioner Kem Parkinson, PA-C working with Nat Christen, MD by Zola Button, ED Scribe. This patient was seen in room APFT24/APFT24 and the patient's care was started at 7:41 PM.      Chief Complaint  Patient presents with  . Headache  . Sore Throat  . Nasal Congestion     The history is provided by the patient. No language interpreter was used.   HPI Comments: Hunter Reyes is a 38 y.o. male with a hx of bronchitis who presents to the Emergency Department complaining of gradual onset URI symptoms that began a little more than 1 week ago. Patient notes that his illness started out as an intermittent productive cough with various colored phlegm but shortly after began experiencing associated HA, sore throat, congestion, left-sided otalgia with drainage, chest tightness, rhinorrhea and swollen lymph nodes. Patient denies itchiness in his eyes, fever , vomiting, bloody mucus, or shortness of breath.  Patient is currently on 2 different inhalers and has been using them.   Patient states he fell and landed on his lower back 2 days ago and has a hx of chronic back pain, which he believes is exacerbated by the fall. He denies any numbness, weakness or  Incontinence of bladder or bowel,  or pain to his legs.   Past Medical History  Diagnosis Date  . Bipolar 1 disorder   . Depression   . Panic attacks   . Obesity   . Deliberate self-cutting     history of  . DDD (degenerative disc disease), lumbar     and thoracic  . Arthritis     hands  . COPD (chronic obstructive pulmonary disease)     denies SOB with daily activities  . History of seizures     as a child - no seizures since before teenage years; states unknown cause  . Foot ulcer, right 11/02/2013  . Peripheral neuropathy   . Non-restorable tooth 10/2013    multiple  . Osteomyelitis 2014    right foot  . Neuropathic ulcer     Past Surgical History  Procedure Laterality Date  . Inner ear surgery Left     as a child  . Multiple extractions with alveoloplasty N/A 11/17/2013    Procedure: MULTIPLE EXTRACTION WITH ALVEOLOPLASTY;  Surgeon: Gae Bon, DDS;  Location: [facility];  Service: Oral Surgery;  Laterality: N/A;   History reviewed. No pertinent family history. History  Substance Use Topics  . Smoking status: Current Every Day Smoker -- 1.00 packs/day for 15 years    Types: Cigarettes  . Smokeless tobacco: Current User    Types: Snuff  . Alcohol Use: Yes     Comment: occasionally    Review of Systems  Constitutional: Negative for fever, appetite change and fatigue.  HENT: Positive for congestion, ear discharge, ear pain, rhinorrhea and sore throat. Negative for sinus pressure.   Eyes: Negative for discharge and itching.  Respiratory: Positive for cough and chest tightness. Negative for shortness of breath and wheezing.   Gastrointestinal: Negative for vomiting, abdominal pain and diarrhea.  Genitourinary: Negative for dysuria, urgency, frequency, hematuria, decreased urine volume and difficulty urinating.  Musculoskeletal: Positive for back pain. Negative for arthralgias, myalgias, neck pain and neck stiffness.  Skin: Negative for rash.  Neurological: Positive for headaches. Negative for seizures and numbness.  All other systems reviewed and are negative.  Allergies  Darvocet and Tramadol  Home Medications   Prior to Admission medications   Medication Sig Start Date End Date Taking? Authorizing Provider  albuterol (PROVENTIL HFA;VENTOLIN HFA) 108 (90 BASE) MCG/ACT inhaler Inhale into the lungs every 6 (six) hours as needed for wheezing or shortness of breath.    Historical Provider, MD  amoxicillin (AMOXIL) 500 MG capsule Take 1 capsule (500 mg total) by mouth 4 (four) times daily. 11/17/13   Gae Bon, DDS  ARIPiprazole (ABILIFY) 5 MG tablet Take 5 mg by mouth daily.    Historical  Provider, MD  citalopram (CELEXA) 20 MG tablet Take 20 mg by mouth daily.    Historical Provider, MD  gabapentin (NEURONTIN) 300 MG capsule Take 600 mg by mouth 2 (two) times daily.     Historical Provider, MD  loratadine-pseudoephedrine (CLARITIN-D 24 HOUR) 10-240 MG per 24 hr tablet Take 1 tablet by mouth daily. 11/17/13   Gae Bon, DDS  oxyCODONE-acetaminophen (PERCOCET) 5-325 MG per tablet Take 1-2 tablets by mouth every 4 (four) hours as needed for severe pain. 11/17/13   Gae Bon, DDS  traZODone (DESYREL) 50 MG tablet Take 50 mg by mouth at bedtime.    Historical Provider, MD   BP 138/72  Pulse 69  Temp(Src) 98.7 F (37.1 C) (Oral)  Resp 20  Ht 6\' 2"  (1.88 m)  Wt 300 lb (136.079 kg)  BMI 38.50 kg/m2  SpO2 100% Physical Exam  Nursing note and vitals reviewed. Constitutional: He is oriented to person, place, and time. He appears well-developed and well-nourished. No distress.  HENT:  Head: Normocephalic and atraumatic.  Right Ear: Tympanic membrane and ear canal normal.  Left Ear: Ear canal normal.  Nose: Mucosal edema present.  Mouth/Throat: Uvula is midline and mucous membranes are normal. Posterior oropharyngeal erythema present. No oropharyngeal exudate, posterior oropharyngeal edema or tonsillar abscesses.  Mild erythema of the left TM. No bulging.  Eyes: Conjunctivae are normal. Pupils are equal, round, and reactive to light.  Neck: Neck supple.  Mild anterior cervical adenopathy  Cardiovascular: Normal rate, regular rhythm, normal heart sounds and intact distal pulses.   No murmur heard. Pulmonary/Chest: Effort normal. No respiratory distress. He has wheezes. He has no rales.  Scattered inspiratory wheezing. No rales.  Musculoskeletal: Normal range of motion. He exhibits no edema.  Tenderness to palpation lumbar spine and paraspinal muscles. 5/5 strength against resistance of the bilateral lower extremities.  Lymphadenopathy:    He has cervical adenopathy.   Neurological: He is alert and oriented to person, place, and time. No cranial nerve deficit.  Skin: Skin is warm and dry. No rash noted.  Psychiatric: He has a normal mood and affect. His behavior is normal.    ED Course  Procedures  DIAGNOSTIC STUDIES: Oxygen Saturation is 100% on RA, nml by my interpretation.    COORDINATION OF CARE: 7:48 PM-Discussed treatment plan   Labs Review Labs Reviewed - No data to display  Imaging Review Dg Lumbar Spine Complete  10/04/2014   CLINICAL DATA:  Fall down steps 2 days prior.  Back pain.  EXAM: LUMBAR SPINE - COMPLETE 4+ VIEW  COMPARISON:  CT 05/10/2013  FINDINGS: Normal alignment of lumbar vertebral bodies. No loss of vertebral body height or disc height. No pars fracture. No subluxation. There is endplate spurring from L2 through L4 unchanged from prior CT.  IMPRESSION: 1. No acute findings lumbar spine. 2. Chronic disc osteophytic disease.   Electronically Signed   By: Nicole Kindred  Leonia Reeves M.D.   On: 10/04/2014 20:49     EKG Interpretation None      MDM   Final diagnoses:  Cough  Lumbar strain, initial encounter  Left non-suppurative otitis media, recurrence not specified   Pt is non-toxic appearing.  Left OM with likely URI.  Will treat with augmentin, prednisone taper and vicodin for back pain.  Pt agrees to continue his inhaler.  No concerning sx's for emergent neurological process.  Ambulates with steady gait.     I personally performed the services described in this documentation, which was scribed in my presence. The recorded information has been reviewed and is accurate.    Chyann Ambrocio L. Jenika Chiem, PA-C 10/06/14 1240

## 2014-10-04 NOTE — Discharge Instructions (Signed)
Cough, Adult  A cough is a reflex. It helps you clear your throat and airways. A cough can help heal your body. A cough can last 2 or 3 weeks (acute) or may last more than 8 weeks (chronic). Some common causes of a cough can include an infection, allergy, or a cold. HOME CARE  Only take medicine as told by your doctor.  If given, take your medicines (antibiotics) as told. Finish them even if you start to feel better.  Use a cold steam vaporizer or humidifier in your home. This can help loosen thick spit (secretions).  Sleep so you are almost sitting up (semi-upright). Use pillows to do this. This helps reduce coughing.  Rest as needed.  Stop smoking if you smoke. GET HELP RIGHT AWAY IF:  You have yellowish-white fluid (pus) in your thick spit.  Your cough gets worse.  Your medicine does not reduce coughing, and you are losing sleep.  You cough up blood.  You have trouble breathing.  Your pain gets worse and medicine does not help.  You have a fever. MAKE SURE YOU:   Understand these instructions.  Will watch your condition.  Will get help right away if you are not doing well or get worse. Document Released: 08/23/2011 Document Revised: 04/26/2014 Document Reviewed: 08/23/2011 [facility] Patient Information 2015 [place], [place]. This information is not intended to replace advice given to you by your health care provider. Make sure you discuss any questions you have with your health care provider.  Lumbosacral Strain Lumbosacral strain is a strain of any of the parts that make up your lumbosacral vertebrae. Your lumbosacral vertebrae are the bones that make up the lower third of your backbone. Your lumbosacral vertebrae are held together by muscles and tough, fibrous tissue (ligaments).  CAUSES  A sudden blow to your back can cause lumbosacral strain. Also, anything that causes an excessive stretch of the muscles in the low back can cause this strain. This is typically seen  when people exert themselves strenuously, fall, lift heavy objects, bend, or crouch repeatedly. RISK FACTORS  Physically demanding work.  Participation in pushing or pulling sports or sports that require a sudden twist of the back (tennis, golf, baseball).  Weight lifting.  Excessive lower back curvature.  Forward-tilted pelvis.  Weak back or abdominal muscles or both.  Tight hamstrings. SIGNS AND SYMPTOMS  Lumbosacral strain may cause pain in the area of your injury or pain that moves (radiates) down your leg.  DIAGNOSIS Your health care provider can often diagnose lumbosacral strain through a physical exam. In some cases, you may need tests such as X-ray exams.  TREATMENT  Treatment for your lower back injury depends on many factors that your clinician will have to evaluate. However, most treatment will include the use of anti-inflammatory medicines. HOME CARE INSTRUCTIONS   Avoid hard physical activities (tennis, racquetball, waterskiing) if you are not in proper physical condition for it. This may aggravate or create problems.  If you have a back problem, avoid sports requiring sudden body movements. Swimming and walking are generally safer activities.  Maintain good posture.  Maintain a healthy weight.  For acute conditions, you may put ice on the injured area.  Put ice in a plastic bag.  Place a towel between your skin and the bag.  Leave the ice on for 20 minutes, 2-3 times a day.  When the low back starts healing, stretching and strengthening exercises may be recommended. SEEK MEDICAL CARE IF:  Your back  pain is getting worse.  You experience severe back pain not relieved with medicines. SEEK IMMEDIATE MEDICAL CARE IF:   You have numbness, tingling, weakness, or problems with the use of your arms or legs.  There is a change in bowel or bladder control.  You have increasing pain in any area of the body, including your belly (abdomen).  You notice shortness  of breath, dizziness, or feel faint.  You feel sick to your stomach (nauseous), are throwing up (vomiting), or become sweaty.  You notice discoloration of your toes or legs, or your feet get very cold. MAKE SURE YOU:   Understand these instructions.  Will watch your condition.  Will get help right away if you are not doing well or get worse. Document Released: 09/19/2005 Document Revised: 12/15/2013 Document Reviewed: 07/29/2013 [facility] Patient Information 2015 [place], [place]. This information is not intended to replace advice given to you by your health care provider. Make sure you discuss any questions you have with your health care provider.

## 2014-10-04 NOTE — ED Notes (Signed)
Fever, cough, sore throat.lt ear hurts, says he fell 2 days ago and "hurt my back".  Says he has d/c from lt ear.  Sputum is yellow- green.

## 2014-10-08 ENCOUNTER — Other Ambulatory Visit: Payer: Self-pay

## 2014-10-08 DIAGNOSIS — L97419 Non-pressure chronic ulcer of right heel and midfoot with unspecified severity: Secondary | ICD-10-CM | POA: Diagnosis not present

## 2014-10-08 DIAGNOSIS — G629 Polyneuropathy, unspecified: Secondary | ICD-10-CM | POA: Diagnosis not present

## 2014-10-08 NOTE — ED Provider Notes (Signed)
Medical screening examination/treatment/procedure(s) were performed by non-physician practitioner and as supervising physician I was immediately available for consultation/collaboration.   EKG Interpretation None       Nat Christen, MD 10/08/14 [phone]

## 2014-10-15 ENCOUNTER — Emergency Department (HOSPITAL_COMMUNITY)
Admission: EM | Admit: 2014-10-15 | Discharge: 2014-10-15 | Disposition: A | Discharge status: Home / Self Care | Payer: Medicaid Other | Attending: Emergency Medicine | Admitting: Emergency Medicine

## 2014-10-15 ENCOUNTER — Emergency Department (HOSPITAL_COMMUNITY): Payer: Medicaid Other

## 2014-10-15 ENCOUNTER — Encounter (HOSPITAL_COMMUNITY): Payer: Self-pay | Admitting: Emergency Medicine

## 2014-10-15 DIAGNOSIS — F410 Panic disorder [episodic paroxysmal anxiety] without agoraphobia: Secondary | ICD-10-CM | POA: Diagnosis not present

## 2014-10-15 DIAGNOSIS — F319 Bipolar disorder, unspecified: Secondary | ICD-10-CM | POA: Insufficient documentation

## 2014-10-15 DIAGNOSIS — Z720 Tobacco use: Secondary | ICD-10-CM | POA: Diagnosis not present

## 2014-10-15 DIAGNOSIS — J449 Chronic obstructive pulmonary disease, unspecified: Secondary | ICD-10-CM | POA: Diagnosis not present

## 2014-10-15 DIAGNOSIS — Z792 Long term (current) use of antibiotics: Secondary | ICD-10-CM | POA: Insufficient documentation

## 2014-10-15 DIAGNOSIS — G629 Polyneuropathy, unspecified: Secondary | ICD-10-CM | POA: Diagnosis not present

## 2014-10-15 DIAGNOSIS — E669 Obesity, unspecified: Secondary | ICD-10-CM | POA: Diagnosis not present

## 2014-10-15 DIAGNOSIS — M79672 Pain in left foot: Secondary | ICD-10-CM

## 2014-10-15 DIAGNOSIS — S3992XA Unspecified injury of lower back, initial encounter: Secondary | ICD-10-CM | POA: Insufficient documentation

## 2014-10-15 DIAGNOSIS — R200 Anesthesia of skin: Secondary | ICD-10-CM | POA: Diagnosis not present

## 2014-10-15 DIAGNOSIS — Y9289 Other specified places as the place of occurrence of the external cause: Secondary | ICD-10-CM | POA: Insufficient documentation

## 2014-10-15 DIAGNOSIS — Z7952 Long term (current) use of systemic steroids: Secondary | ICD-10-CM | POA: Insufficient documentation

## 2014-10-15 DIAGNOSIS — S93402A Sprain of unspecified ligament of left ankle, initial encounter: Secondary | ICD-10-CM

## 2014-10-15 DIAGNOSIS — S99922A Unspecified injury of left foot, initial encounter: Secondary | ICD-10-CM | POA: Diagnosis present

## 2014-10-15 DIAGNOSIS — W1830XA Fall on same level, unspecified, initial encounter: Secondary | ICD-10-CM | POA: Insufficient documentation

## 2014-10-15 DIAGNOSIS — Z872 Personal history of diseases of the skin and subcutaneous tissue: Secondary | ICD-10-CM | POA: Insufficient documentation

## 2014-10-15 DIAGNOSIS — G40909 Epilepsy, unspecified, not intractable, without status epilepticus: Secondary | ICD-10-CM | POA: Insufficient documentation

## 2014-10-15 DIAGNOSIS — Y9389 Activity, other specified: Secondary | ICD-10-CM | POA: Diagnosis not present

## 2014-10-15 DIAGNOSIS — M1990 Unspecified osteoarthritis, unspecified site: Secondary | ICD-10-CM | POA: Insufficient documentation

## 2014-10-15 DIAGNOSIS — Z79899 Other long term (current) drug therapy: Secondary | ICD-10-CM | POA: Diagnosis not present

## 2014-10-15 DIAGNOSIS — L97419 Non-pressure chronic ulcer of right heel and midfoot with unspecified severity: Secondary | ICD-10-CM | POA: Diagnosis not present

## 2014-10-15 HISTORY — DX: Borderline personality disorder: F60.3

## 2014-10-15 MED ORDER — PROMETHAZINE HCL 12.5 MG PO TABS
12.5000 mg | ORAL_TABLET | Freq: Once | ORAL | Status: AC
  Administered 2014-10-15: 12.5 mg via ORAL
  Filled 2014-10-15: qty 1

## 2014-10-15 MED ORDER — DICLOFENAC SODIUM 75 MG PO TBEC: 75.0000 mg | DELAYED_RELEASE_TABLET | Freq: Two times a day (BID) | ORAL | Status: DC

## 2014-10-15 MED ORDER — ACETAMINOPHEN-CODEINE #3 300-30 MG PO TABS
2.0000 | ORAL_TABLET | Freq: Once | ORAL | Status: AC
  Administered 2014-10-15: 2 via ORAL
  Filled 2014-10-15: qty 2

## 2014-10-15 MED ORDER — ACETAMINOPHEN-CODEINE #3 300-30 MG PO TABS: 1.0000 | ORAL_TABLET | Freq: Four times a day (QID) | ORAL | Status: DC | PRN

## 2014-10-15 MED ORDER — KETOROLAC TROMETHAMINE 10 MG PO TABS
10.0000 mg | ORAL_TABLET | Freq: Once | ORAL | Status: AC
  Administered 2014-10-15: 10 mg via ORAL
  Filled 2014-10-15: qty 1

## 2014-10-15 NOTE — ED Notes (Signed)
Pt states his lt foot started hurting 2 days ago, unsure of how he hurt it.

## 2014-10-15 NOTE — ED Notes (Signed)
Hobson at the bedside.

## 2014-10-15 NOTE — ED Provider Notes (Signed)
Medical screening examination/treatment/procedure(s) were performed by non-physician practitioner and as supervising physician I was immediately available for consultation/collaboration.   EKG Interpretation None       Nat Christen, MD 10/15/14 1845

## 2014-10-15 NOTE — Discharge Instructions (Signed)
The xray of your foot reveals a old chronic fracture of the left first toe. No other fracture noted. No FB noted. Please see your MD for follow up of your foot pain. Use the splint for the next 10 days.

## 2014-10-15 NOTE — ED Notes (Signed)
Patient given discharge instruction, verbalized understand. Patient ambulatory out of the department.  

## 2014-10-15 NOTE — ED Provider Notes (Signed)
CSN: [id number]     Arrival date & time 10/15/14  1600 History   First MD Initiated Contact with Patient 10/15/14 1615     Chief Complaint  Patient presents with  . Foot Injury     (Consider location/radiation/quality/duration/timing/severity/associated sxs/prior Treatment) HPI Comments: CC: Foot pain and swelling  Pt states he had peripheral neuropathy. He has a wound to the right foot that is being treated by the wound team. 2 or 3 days ago the pt stumbled over his blanket and injured the left foot. He recently noted swelling of the foot behind the "little toe area". His wound MD ordered xray of the foot for next week, but the patient wanted the foot looked at today and xray obtained.  The history is provided by the patient and the spouse.    Past Medical History  Diagnosis Date  . Bipolar 1 disorder   . Depression   . Panic attacks   . Obesity   . Deliberate self-cutting     history of  . DDD (degenerative disc disease), lumbar     and thoracic  . Arthritis     hands  . COPD (chronic obstructive pulmonary disease)     denies SOB with daily activities  . History of seizures     as a child - no seizures since before teenage years; states unknown cause  . Foot ulcer, right 11/02/2013  . Peripheral neuropathy   . Non-restorable tooth 10/2013    multiple  . Osteomyelitis 2014    right foot  . Neuropathic ulcer   . Borderline personality disorder    Past Surgical History  Procedure Laterality Date  . Inner ear surgery Left     as a child  . Multiple extractions with alveoloplasty N/A 11/17/2013    Procedure: MULTIPLE EXTRACTION WITH ALVEOLOPLASTY;  Surgeon: Gae Bon, DDS;  Location: [place];  Service: Oral Surgery;  Laterality: N/A;   History reviewed. No pertinent family history. History  Substance Use Topics  . Smoking status: Current Every Day Smoker -- 1.00 packs/day for 15 years    Types: Cigarettes  . Smokeless tobacco: Current User    Types: Snuff  .  Alcohol Use: Yes     Comment: occasionally    Review of Systems  Constitutional: Negative for activity change.       All ROS Neg except as noted in HPI  Eyes: Negative for photophobia and discharge.  Respiratory: Negative for cough, shortness of breath and wheezing.   Cardiovascular: Negative for chest pain and palpitations.  Gastrointestinal: Negative for abdominal pain and blood in stool.  Genitourinary: Negative for dysuria, frequency and hematuria.  Musculoskeletal: Positive for arthralgias and back pain. Negative for neck pain.  Skin: Negative.   Neurological: Positive for seizures and numbness. Negative for dizziness and speech difficulty.  Psychiatric/Behavioral: Negative for hallucinations and confusion.       Depression      Allergies  Darvocet and Tramadol  Home Medications   Prior to Admission medications   Medication Sig Start Date End Date Taking? Authorizing Provider  albuterol (PROVENTIL HFA;VENTOLIN HFA) 108 (90 BASE) MCG/ACT inhaler Inhale into the lungs every 6 (six) hours as needed for wheezing or shortness of breath.    Historical Provider, MD  amoxicillin (AMOXIL) 500 MG capsule Take 1 capsule (500 mg total) by mouth 4 (four) times daily. 11/17/13   Gae Bon, DDS  amoxicillin-clavulanate (AUGMENTIN) 500-125 MG per tablet Take 1 tablet (500 mg total) by mouth  2 (two) times daily. For 10 days 10/04/14   Tammy L. Triplett, PA-C  ARIPiprazole (ABILIFY) 5 MG tablet Take 5 mg by mouth daily.    Historical Provider, MD  citalopram (CELEXA) 20 MG tablet Take 20 mg by mouth daily.    Historical Provider, MD  gabapentin (NEURONTIN) 300 MG capsule Take 600 mg by mouth 2 (two) times daily.     Historical Provider, MD  HYDROcodone-acetaminophen (NORCO/VICODIN) 5-325 MG per tablet Take one-two tabs po q 4-6 hrs prn pain 10/04/14   Tammy L. Triplett, PA-C  loratadine-pseudoephedrine (CLARITIN-D 24 HOUR) 10-240 MG per 24 hr tablet Take 1 tablet by mouth daily. 11/17/13    Gae Bon, DDS  oxyCODONE-acetaminophen (PERCOCET) 5-325 MG per tablet Take 1-2 tablets by mouth every 4 (four) hours as needed for severe pain. 11/17/13   Gae Bon, DDS  predniSONE (DELTASONE) 10 MG tablet Take 6 tablets day one, 5 tablets day two, 4 tablets day three, 3 tablets day four, 2 tablets day five, then 1 tablet day six 10/04/14   Tammy L. Triplett, PA-C  traZODone (DESYREL) 50 MG tablet Take 50 mg by mouth at bedtime.    Historical Provider, MD   BP 125/78  Pulse 85  Temp(Src) 98.4 F (36.9 C) (Oral)  Ht 6' 1.5" (1.867 m)  Wt 301 lb (136.533 kg)  BMI 39.17 kg/m2  SpO2 99% Physical Exam  Nursing note and vitals reviewed. Constitutional: He is oriented to person, place, and time. He appears well-developed and well-nourished.  Non-toxic appearance.  HENT:  Head: Normocephalic.  Right Ear: Tympanic membrane and external ear normal.  Left Ear: Tympanic membrane and external ear normal.  Eyes: EOM and lids are normal. Pupils are equal, round, and reactive to light.  Neck: Normal range of motion. Neck supple. Carotid bruit is not present.  Cardiovascular: Normal rate, regular rhythm, normal heart sounds, intact distal pulses and normal pulses.   Pulmonary/Chest: Breath sounds normal. No respiratory distress.  Abdominal: Soft. Bowel sounds are normal. There is no tenderness. There is no guarding.  Musculoskeletal: Normal range of motion.  The right foot is bandaged  There is good ROM of the left knee. The achilles is intact. The DP pulse is 2+, cap refill is less than 2 sec.Marland KitchenThere is a claw deformity of the toes of the right foot. The is deformity of the distal first toe of the left foot.. There is swelling and tenderness of the lateral malleolus. Callus formation at the MT heads, but no puncture wound involving the left foot.  Lymphadenopathy:       Head (right side): No submandibular adenopathy present.       Head (left side): No submandibular adenopathy present.     He has no cervical adenopathy.  Neurological: He is alert and oriented to person, place, and time. He has normal strength. No cranial nerve deficit or sensory deficit.  Skin: Skin is warm and dry.  Psychiatric: He has a normal mood and affect. His speech is normal.    ED Course  Procedures (including critical care time) Labs Review Labs Reviewed - No data to display  Imaging Review Dg Foot Complete Left  10/15/2014   CLINICAL DATA:  Status post fall.  Foot pain and swelling.  EXAM: LEFT FOOT - COMPLETE 3+ VIEW  COMPARISON:  Plain films 04/27/2013.  FINDINGS: There is a fracture of the head of the proximal phalanx of the great toe. The fracture is oblique in orientation extending from the medial  metaphysis to the articular surface centrally. The fracture appears subacute or chronic with margins well corticated. No other acute bony or joint abnormality is identified. Clawtoe deformities are noted. There is some calcaneal spurring.  IMPRESSION: Fracture of the head of the proximal phalanx of the left great toe appears chronic. No acute fracture is identified.  Clawtoe deformities.   Electronically Signed   By: Inge Rise M.D.   On: 10/15/2014 16:55     EKG Interpretation None      MDM  Vital signs stable. Pulse Ox 99% on room air. Xray of the left foot reveals a chronic fracture of the first toe. No other fx noted. Test results shown and explained to the patient. The plan is for the pt to elevate the foot. Pt fitted for ASO splint for ankle sprain.  Pt states his MD will not give him pain medications. He has pain for the past 2 days related to the ankle sprain and neuropathy. Rx for tylenol codeine #15 tabs given to the patient. Pt referred back to his MD for follow up and management.   Final diagnoses:  Left foot pain  Ankle sprain, left, initial encounter    **I have reviewed nursing notes, vital signs, and all appropriate lab and imaging results for this patient.Lenox Ahr, PA-C 10/15/14 1737

## 2014-10-22 DIAGNOSIS — G629 Polyneuropathy, unspecified: Secondary | ICD-10-CM | POA: Diagnosis not present

## 2014-10-22 DIAGNOSIS — L97419 Non-pressure chronic ulcer of right heel and midfoot with unspecified severity: Secondary | ICD-10-CM | POA: Diagnosis not present

## 2014-10-29 ENCOUNTER — Encounter (HOSPITAL_BASED_OUTPATIENT_CLINIC_OR_DEPARTMENT_OTHER): Payer: Medicaid Other | Attending: General Surgery

## 2014-10-29 DIAGNOSIS — L97419 Non-pressure chronic ulcer of right heel and midfoot with unspecified severity: Secondary | ICD-10-CM | POA: Insufficient documentation

## 2014-11-05 DIAGNOSIS — L97419 Non-pressure chronic ulcer of right heel and midfoot with unspecified severity: Secondary | ICD-10-CM | POA: Diagnosis not present

## 2014-11-12 DIAGNOSIS — L97419 Non-pressure chronic ulcer of right heel and midfoot with unspecified severity: Secondary | ICD-10-CM | POA: Diagnosis not present

## 2014-11-26 ENCOUNTER — Encounter (HOSPITAL_BASED_OUTPATIENT_CLINIC_OR_DEPARTMENT_OTHER): Payer: Medicaid Other | Attending: Internal Medicine

## 2014-11-26 DIAGNOSIS — G629 Polyneuropathy, unspecified: Secondary | ICD-10-CM | POA: Insufficient documentation

## 2014-11-26 DIAGNOSIS — L97519 Non-pressure chronic ulcer of other part of right foot with unspecified severity: Secondary | ICD-10-CM | POA: Insufficient documentation

## 2014-12-03 DIAGNOSIS — G629 Polyneuropathy, unspecified: Secondary | ICD-10-CM | POA: Diagnosis not present

## 2014-12-03 DIAGNOSIS — L97519 Non-pressure chronic ulcer of other part of right foot with unspecified severity: Secondary | ICD-10-CM | POA: Diagnosis present

## 2014-12-10 ENCOUNTER — Other Ambulatory Visit: Payer: Self-pay | Admitting: Internal Medicine

## 2014-12-10 ENCOUNTER — Ambulatory Visit (HOSPITAL_COMMUNITY)
Admission: RE | Admit: 2014-12-10 | Discharge: 2014-12-10 | Disposition: A | Discharge status: Home / Self Care | Payer: Medicaid Other | Source: Ambulatory Visit | Attending: Internal Medicine | Admitting: Internal Medicine

## 2014-12-10 DIAGNOSIS — M2011 Hallux valgus (acquired), right foot: Secondary | ICD-10-CM | POA: Diagnosis not present

## 2014-12-10 DIAGNOSIS — M869 Osteomyelitis, unspecified: Secondary | ICD-10-CM

## 2014-12-10 DIAGNOSIS — X58XXXA Exposure to other specified factors, initial encounter: Secondary | ICD-10-CM | POA: Diagnosis not present

## 2014-12-10 DIAGNOSIS — S91109A Unspecified open wound of unspecified toe(s) without damage to nail, initial encounter: Secondary | ICD-10-CM | POA: Diagnosis present

## 2014-12-10 DIAGNOSIS — L97519 Non-pressure chronic ulcer of other part of right foot with unspecified severity: Secondary | ICD-10-CM | POA: Diagnosis not present

## 2014-12-10 DIAGNOSIS — G629 Polyneuropathy, unspecified: Secondary | ICD-10-CM | POA: Diagnosis not present

## 2014-12-10 DIAGNOSIS — S93104A Unspecified dislocation of right toe(s), initial encounter: Secondary | ICD-10-CM | POA: Diagnosis not present

## 2014-12-31 ENCOUNTER — Encounter (HOSPITAL_BASED_OUTPATIENT_CLINIC_OR_DEPARTMENT_OTHER): Payer: Medicaid Other | Attending: Internal Medicine

## 2014-12-31 DIAGNOSIS — L97411 Non-pressure chronic ulcer of right heel and midfoot limited to breakdown of skin: Secondary | ICD-10-CM | POA: Diagnosis not present

## 2015-01-07 DIAGNOSIS — L97411 Non-pressure chronic ulcer of right heel and midfoot limited to breakdown of skin: Secondary | ICD-10-CM | POA: Diagnosis not present

## 2015-01-18 DIAGNOSIS — L97411 Non-pressure chronic ulcer of right heel and midfoot limited to breakdown of skin: Secondary | ICD-10-CM | POA: Diagnosis not present

## 2015-01-28 ENCOUNTER — Encounter (HOSPITAL_BASED_OUTPATIENT_CLINIC_OR_DEPARTMENT_OTHER): Payer: Medicaid Other | Attending: Internal Medicine

## 2015-01-28 DIAGNOSIS — L97412 Non-pressure chronic ulcer of right heel and midfoot with fat layer exposed: Secondary | ICD-10-CM | POA: Diagnosis not present

## 2015-01-28 DIAGNOSIS — G609 Hereditary and idiopathic neuropathy, unspecified: Secondary | ICD-10-CM | POA: Insufficient documentation

## 2015-02-11 DIAGNOSIS — G609 Hereditary and idiopathic neuropathy, unspecified: Secondary | ICD-10-CM | POA: Diagnosis not present

## 2015-02-11 DIAGNOSIS — L97412 Non-pressure chronic ulcer of right heel and midfoot with fat layer exposed: Secondary | ICD-10-CM | POA: Diagnosis not present

## 2015-02-18 DIAGNOSIS — L97412 Non-pressure chronic ulcer of right heel and midfoot with fat layer exposed: Secondary | ICD-10-CM | POA: Diagnosis not present

## 2015-02-18 DIAGNOSIS — G609 Hereditary and idiopathic neuropathy, unspecified: Secondary | ICD-10-CM | POA: Diagnosis not present

## 2015-03-02 ENCOUNTER — Other Ambulatory Visit (HOSPITAL_COMMUNITY): Payer: Self-pay | Admitting: Internal Medicine

## 2015-03-02 ENCOUNTER — Ambulatory Visit (HOSPITAL_COMMUNITY)
Admission: RE | Admit: 2015-03-02 | Discharge: 2015-03-02 | Disposition: A | Discharge status: Home / Self Care | Payer: Medicaid Other | Source: Ambulatory Visit | Attending: Internal Medicine | Admitting: Internal Medicine

## 2015-03-02 DIAGNOSIS — Z720 Tobacco use: Secondary | ICD-10-CM | POA: Diagnosis not present

## 2015-03-02 DIAGNOSIS — J40 Bronchitis, not specified as acute or chronic: Secondary | ICD-10-CM

## 2015-03-02 DIAGNOSIS — R05 Cough: Secondary | ICD-10-CM | POA: Diagnosis present

## 2015-03-04 ENCOUNTER — Encounter (HOSPITAL_BASED_OUTPATIENT_CLINIC_OR_DEPARTMENT_OTHER): Payer: Medicaid Other | Attending: Internal Medicine

## 2015-03-04 DIAGNOSIS — G609 Hereditary and idiopathic neuropathy, unspecified: Secondary | ICD-10-CM | POA: Diagnosis present

## 2015-03-04 DIAGNOSIS — L97411 Non-pressure chronic ulcer of right heel and midfoot limited to breakdown of skin: Secondary | ICD-10-CM | POA: Insufficient documentation

## 2015-03-04 DIAGNOSIS — L97521 Non-pressure chronic ulcer of other part of left foot limited to breakdown of skin: Secondary | ICD-10-CM | POA: Diagnosis not present

## 2015-03-11 DIAGNOSIS — L97521 Non-pressure chronic ulcer of other part of left foot limited to breakdown of skin: Secondary | ICD-10-CM | POA: Diagnosis not present

## 2015-03-11 DIAGNOSIS — G609 Hereditary and idiopathic neuropathy, unspecified: Secondary | ICD-10-CM | POA: Diagnosis not present

## 2015-03-11 DIAGNOSIS — L97411 Non-pressure chronic ulcer of right heel and midfoot limited to breakdown of skin: Secondary | ICD-10-CM | POA: Diagnosis not present

## 2015-03-18 DIAGNOSIS — G609 Hereditary and idiopathic neuropathy, unspecified: Secondary | ICD-10-CM | POA: Diagnosis not present

## 2015-03-18 DIAGNOSIS — L97521 Non-pressure chronic ulcer of other part of left foot limited to breakdown of skin: Secondary | ICD-10-CM | POA: Diagnosis not present

## 2015-03-18 DIAGNOSIS — L97411 Non-pressure chronic ulcer of right heel and midfoot limited to breakdown of skin: Secondary | ICD-10-CM | POA: Diagnosis not present

## 2015-03-25 ENCOUNTER — Other Ambulatory Visit (HOSPITAL_BASED_OUTPATIENT_CLINIC_OR_DEPARTMENT_OTHER): Payer: Self-pay | Admitting: General Surgery

## 2015-03-25 ENCOUNTER — Ambulatory Visit (HOSPITAL_COMMUNITY)
Admission: RE | Admit: 2015-03-25 | Discharge: 2015-03-25 | Disposition: A | Discharge status: Home / Self Care | Payer: Medicaid Other | Source: Ambulatory Visit | Attending: General Surgery | Admitting: General Surgery

## 2015-03-25 ENCOUNTER — Encounter (HOSPITAL_BASED_OUTPATIENT_CLINIC_OR_DEPARTMENT_OTHER): Payer: Medicaid Other | Attending: Internal Medicine

## 2015-03-25 DIAGNOSIS — L97421 Non-pressure chronic ulcer of left heel and midfoot limited to breakdown of skin: Secondary | ICD-10-CM | POA: Insufficient documentation

## 2015-03-25 DIAGNOSIS — L97411 Non-pressure chronic ulcer of right heel and midfoot limited to breakdown of skin: Secondary | ICD-10-CM | POA: Diagnosis not present

## 2015-03-25 DIAGNOSIS — L97811 Non-pressure chronic ulcer of other part of right lower leg limited to breakdown of skin: Secondary | ICD-10-CM | POA: Insufficient documentation

## 2015-03-25 DIAGNOSIS — G609 Hereditary and idiopathic neuropathy, unspecified: Secondary | ICD-10-CM | POA: Diagnosis not present

## 2015-03-25 DIAGNOSIS — L97511 Non-pressure chronic ulcer of other part of right foot limited to breakdown of skin: Secondary | ICD-10-CM | POA: Diagnosis not present

## 2015-03-25 DIAGNOSIS — L97519 Non-pressure chronic ulcer of other part of right foot with unspecified severity: Secondary | ICD-10-CM | POA: Diagnosis not present

## 2015-03-25 DIAGNOSIS — S91301A Unspecified open wound, right foot, initial encounter: Secondary | ICD-10-CM | POA: Diagnosis present

## 2015-03-25 DIAGNOSIS — M869 Osteomyelitis, unspecified: Secondary | ICD-10-CM

## 2015-03-25 DIAGNOSIS — L84 Corns and callosities: Secondary | ICD-10-CM | POA: Insufficient documentation

## 2015-04-01 DIAGNOSIS — L97411 Non-pressure chronic ulcer of right heel and midfoot limited to breakdown of skin: Secondary | ICD-10-CM | POA: Diagnosis not present

## 2015-04-01 DIAGNOSIS — L97511 Non-pressure chronic ulcer of other part of right foot limited to breakdown of skin: Secondary | ICD-10-CM | POA: Diagnosis not present

## 2015-04-01 DIAGNOSIS — L97421 Non-pressure chronic ulcer of left heel and midfoot limited to breakdown of skin: Secondary | ICD-10-CM | POA: Diagnosis not present

## 2015-04-01 DIAGNOSIS — L97811 Non-pressure chronic ulcer of other part of right lower leg limited to breakdown of skin: Secondary | ICD-10-CM | POA: Diagnosis not present

## 2015-04-08 DIAGNOSIS — L97811 Non-pressure chronic ulcer of other part of right lower leg limited to breakdown of skin: Secondary | ICD-10-CM | POA: Diagnosis not present

## 2015-04-08 DIAGNOSIS — L97511 Non-pressure chronic ulcer of other part of right foot limited to breakdown of skin: Secondary | ICD-10-CM | POA: Diagnosis not present

## 2015-04-08 DIAGNOSIS — L97411 Non-pressure chronic ulcer of right heel and midfoot limited to breakdown of skin: Secondary | ICD-10-CM | POA: Diagnosis not present

## 2015-04-08 DIAGNOSIS — L97421 Non-pressure chronic ulcer of left heel and midfoot limited to breakdown of skin: Secondary | ICD-10-CM | POA: Diagnosis not present

## 2015-04-15 DIAGNOSIS — L97811 Non-pressure chronic ulcer of other part of right lower leg limited to breakdown of skin: Secondary | ICD-10-CM | POA: Diagnosis not present

## 2015-04-15 DIAGNOSIS — L97421 Non-pressure chronic ulcer of left heel and midfoot limited to breakdown of skin: Secondary | ICD-10-CM | POA: Diagnosis not present

## 2015-04-15 DIAGNOSIS — L97411 Non-pressure chronic ulcer of right heel and midfoot limited to breakdown of skin: Secondary | ICD-10-CM | POA: Diagnosis not present

## 2015-04-15 DIAGNOSIS — L97511 Non-pressure chronic ulcer of other part of right foot limited to breakdown of skin: Secondary | ICD-10-CM | POA: Diagnosis not present

## 2015-04-22 DIAGNOSIS — L97421 Non-pressure chronic ulcer of left heel and midfoot limited to breakdown of skin: Secondary | ICD-10-CM | POA: Diagnosis not present

## 2015-04-22 DIAGNOSIS — L97811 Non-pressure chronic ulcer of other part of right lower leg limited to breakdown of skin: Secondary | ICD-10-CM | POA: Diagnosis not present

## 2015-04-22 DIAGNOSIS — L97411 Non-pressure chronic ulcer of right heel and midfoot limited to breakdown of skin: Secondary | ICD-10-CM | POA: Diagnosis not present

## 2015-04-22 DIAGNOSIS — L97511 Non-pressure chronic ulcer of other part of right foot limited to breakdown of skin: Secondary | ICD-10-CM | POA: Diagnosis not present

## 2015-04-29 ENCOUNTER — Encounter (HOSPITAL_BASED_OUTPATIENT_CLINIC_OR_DEPARTMENT_OTHER): Payer: Medicaid Other | Attending: Internal Medicine

## 2015-04-29 DIAGNOSIS — G609 Hereditary and idiopathic neuropathy, unspecified: Secondary | ICD-10-CM | POA: Diagnosis not present

## 2015-04-29 DIAGNOSIS — L97411 Non-pressure chronic ulcer of right heel and midfoot limited to breakdown of skin: Secondary | ICD-10-CM | POA: Diagnosis not present

## 2015-04-29 DIAGNOSIS — L84 Corns and callosities: Secondary | ICD-10-CM | POA: Diagnosis not present

## 2015-05-06 DIAGNOSIS — G609 Hereditary and idiopathic neuropathy, unspecified: Secondary | ICD-10-CM | POA: Diagnosis not present

## 2015-05-06 DIAGNOSIS — L84 Corns and callosities: Secondary | ICD-10-CM | POA: Diagnosis not present

## 2015-05-06 DIAGNOSIS — L97411 Non-pressure chronic ulcer of right heel and midfoot limited to breakdown of skin: Secondary | ICD-10-CM | POA: Diagnosis not present

## 2015-05-13 DIAGNOSIS — L84 Corns and callosities: Secondary | ICD-10-CM | POA: Diagnosis not present

## 2015-05-13 DIAGNOSIS — L97411 Non-pressure chronic ulcer of right heel and midfoot limited to breakdown of skin: Secondary | ICD-10-CM | POA: Diagnosis not present

## 2015-05-13 DIAGNOSIS — G609 Hereditary and idiopathic neuropathy, unspecified: Secondary | ICD-10-CM | POA: Diagnosis not present

## 2015-05-20 DIAGNOSIS — L97411 Non-pressure chronic ulcer of right heel and midfoot limited to breakdown of skin: Secondary | ICD-10-CM | POA: Diagnosis not present

## 2015-05-20 DIAGNOSIS — G609 Hereditary and idiopathic neuropathy, unspecified: Secondary | ICD-10-CM | POA: Diagnosis not present

## 2015-05-20 DIAGNOSIS — L84 Corns and callosities: Secondary | ICD-10-CM | POA: Diagnosis not present

## 2015-05-27 ENCOUNTER — Encounter (HOSPITAL_BASED_OUTPATIENT_CLINIC_OR_DEPARTMENT_OTHER): Payer: Medicaid Other | Attending: Internal Medicine

## 2015-05-27 DIAGNOSIS — L97411 Non-pressure chronic ulcer of right heel and midfoot limited to breakdown of skin: Secondary | ICD-10-CM | POA: Insufficient documentation

## 2015-05-27 DIAGNOSIS — G629 Polyneuropathy, unspecified: Secondary | ICD-10-CM | POA: Insufficient documentation

## 2015-05-27 DIAGNOSIS — G40909 Epilepsy, unspecified, not intractable, without status epilepticus: Secondary | ICD-10-CM | POA: Insufficient documentation

## 2015-06-02 ENCOUNTER — Encounter (HOSPITAL_BASED_OUTPATIENT_CLINIC_OR_DEPARTMENT_OTHER): Payer: Medicaid Other

## 2015-06-02 DIAGNOSIS — L97411 Non-pressure chronic ulcer of right heel and midfoot limited to breakdown of skin: Secondary | ICD-10-CM | POA: Insufficient documentation

## 2015-06-02 DIAGNOSIS — G629 Polyneuropathy, unspecified: Secondary | ICD-10-CM | POA: Insufficient documentation

## 2015-06-02 DIAGNOSIS — G40909 Epilepsy, unspecified, not intractable, without status epilepticus: Secondary | ICD-10-CM | POA: Insufficient documentation

## 2015-06-03 ENCOUNTER — Encounter (HOSPITAL_BASED_OUTPATIENT_CLINIC_OR_DEPARTMENT_OTHER): Payer: Medicaid Other

## 2015-06-10 DIAGNOSIS — L97411 Non-pressure chronic ulcer of right heel and midfoot limited to breakdown of skin: Secondary | ICD-10-CM | POA: Diagnosis not present

## 2015-06-10 DIAGNOSIS — G40909 Epilepsy, unspecified, not intractable, without status epilepticus: Secondary | ICD-10-CM | POA: Diagnosis not present

## 2015-06-10 DIAGNOSIS — G629 Polyneuropathy, unspecified: Secondary | ICD-10-CM | POA: Diagnosis not present

## 2015-06-17 DIAGNOSIS — G629 Polyneuropathy, unspecified: Secondary | ICD-10-CM | POA: Diagnosis not present

## 2015-06-17 DIAGNOSIS — G40909 Epilepsy, unspecified, not intractable, without status epilepticus: Secondary | ICD-10-CM | POA: Diagnosis not present

## 2015-06-17 DIAGNOSIS — L97411 Non-pressure chronic ulcer of right heel and midfoot limited to breakdown of skin: Secondary | ICD-10-CM | POA: Diagnosis not present

## 2015-06-24 ENCOUNTER — Encounter (HOSPITAL_BASED_OUTPATIENT_CLINIC_OR_DEPARTMENT_OTHER): Payer: Medicaid Other | Attending: Internal Medicine

## 2015-06-24 DIAGNOSIS — L97421 Non-pressure chronic ulcer of left heel and midfoot limited to breakdown of skin: Secondary | ICD-10-CM | POA: Diagnosis present

## 2015-06-24 DIAGNOSIS — G609 Hereditary and idiopathic neuropathy, unspecified: Secondary | ICD-10-CM | POA: Diagnosis not present

## 2015-06-24 DIAGNOSIS — G40909 Epilepsy, unspecified, not intractable, without status epilepticus: Secondary | ICD-10-CM | POA: Diagnosis not present

## 2015-11-03 ENCOUNTER — Encounter (HOSPITAL_BASED_OUTPATIENT_CLINIC_OR_DEPARTMENT_OTHER): Payer: Medicaid Other | Attending: Internal Medicine

## 2015-11-03 DIAGNOSIS — M1990 Unspecified osteoarthritis, unspecified site: Secondary | ICD-10-CM | POA: Diagnosis not present

## 2015-11-03 DIAGNOSIS — L03032 Cellulitis of left toe: Secondary | ICD-10-CM | POA: Diagnosis not present

## 2015-11-03 DIAGNOSIS — J449 Chronic obstructive pulmonary disease, unspecified: Secondary | ICD-10-CM | POA: Diagnosis not present

## 2015-11-03 DIAGNOSIS — L97511 Non-pressure chronic ulcer of other part of right foot limited to breakdown of skin: Secondary | ICD-10-CM | POA: Diagnosis not present

## 2015-11-03 DIAGNOSIS — G9009 Other idiopathic peripheral autonomic neuropathy: Secondary | ICD-10-CM | POA: Insufficient documentation

## 2015-11-10 DIAGNOSIS — L03032 Cellulitis of left toe: Secondary | ICD-10-CM | POA: Diagnosis not present

## 2015-11-10 DIAGNOSIS — J449 Chronic obstructive pulmonary disease, unspecified: Secondary | ICD-10-CM | POA: Diagnosis not present

## 2015-11-10 DIAGNOSIS — G9009 Other idiopathic peripheral autonomic neuropathy: Secondary | ICD-10-CM | POA: Diagnosis not present

## 2015-11-10 DIAGNOSIS — L97511 Non-pressure chronic ulcer of other part of right foot limited to breakdown of skin: Secondary | ICD-10-CM | POA: Diagnosis not present

## 2015-11-24 ENCOUNTER — Encounter (HOSPITAL_BASED_OUTPATIENT_CLINIC_OR_DEPARTMENT_OTHER): Payer: Medicaid Other | Attending: Internal Medicine

## 2015-11-24 DIAGNOSIS — G9009 Other idiopathic peripheral autonomic neuropathy: Secondary | ICD-10-CM | POA: Diagnosis not present

## 2015-11-24 DIAGNOSIS — M1990 Unspecified osteoarthritis, unspecified site: Secondary | ICD-10-CM | POA: Insufficient documentation

## 2015-11-24 DIAGNOSIS — J449 Chronic obstructive pulmonary disease, unspecified: Secondary | ICD-10-CM | POA: Insufficient documentation

## 2015-11-24 DIAGNOSIS — G40909 Epilepsy, unspecified, not intractable, without status epilepticus: Secondary | ICD-10-CM | POA: Diagnosis not present

## 2015-11-24 DIAGNOSIS — L84 Corns and callosities: Secondary | ICD-10-CM | POA: Insufficient documentation

## 2015-11-24 DIAGNOSIS — L03032 Cellulitis of left toe: Secondary | ICD-10-CM | POA: Insufficient documentation

## 2015-11-24 DIAGNOSIS — L97521 Non-pressure chronic ulcer of other part of left foot limited to breakdown of skin: Secondary | ICD-10-CM | POA: Diagnosis not present

## 2015-11-24 DIAGNOSIS — L97511 Non-pressure chronic ulcer of other part of right foot limited to breakdown of skin: Secondary | ICD-10-CM | POA: Insufficient documentation

## 2015-11-28 ENCOUNTER — Encounter: Payer: Self-pay | Admitting: Podiatry

## 2015-11-28 ENCOUNTER — Ambulatory Visit (INDEPENDENT_AMBULATORY_CARE_PROVIDER_SITE_OTHER): Payer: Medicaid Other | Admitting: Podiatry

## 2015-11-28 ENCOUNTER — Ambulatory Visit (INDEPENDENT_AMBULATORY_CARE_PROVIDER_SITE_OTHER): Payer: Medicaid Other

## 2015-11-28 VITALS — BP 118/78 | HR 102 | Temp 97.6°F | Resp 78

## 2015-11-28 DIAGNOSIS — L0390 Cellulitis, unspecified: Secondary | ICD-10-CM

## 2015-11-28 DIAGNOSIS — M216X9 Other acquired deformities of unspecified foot: Secondary | ICD-10-CM

## 2015-11-28 DIAGNOSIS — R52 Pain, unspecified: Secondary | ICD-10-CM

## 2015-11-28 DIAGNOSIS — L84 Corns and callosities: Secondary | ICD-10-CM

## 2015-11-28 LAB — CBC WITH DIFFERENTIAL/PLATELET
Basophils Absolute: 0 10*3/uL (ref 0.0–0.1)
Basophils Relative: 0 % (ref 0–1)
EOS ABS: 0.4 10*3/uL (ref 0.0–0.7)
EOS PCT: 3 % (ref 0–5)
HCT: 47.4 % (ref 39.0–52.0)
Hemoglobin: 16.3 g/dL (ref 13.0–17.0)
LYMPHS ABS: 4.2 10*3/uL — AB (ref 0.7–4.0)
Lymphocytes Relative: 31 % (ref 12–46)
MCH: 30.2 pg (ref 26.0–34.0)
MCHC: 34.4 g/dL (ref 30.0–36.0)
MCV: 87.8 fL (ref 78.0–100.0)
MONO ABS: 2.3 10*3/uL — AB (ref 0.1–1.0)
MONOS PCT: 17 % — AB (ref 3–12)
MPV: 9.8 fL (ref 8.6–12.4)
Neutro Abs: 6.7 10*3/uL (ref 1.7–7.7)
Neutrophils Relative %: 49 % (ref 43–77)
PLATELETS: 283 10*3/uL (ref 150–400)
RBC: 5.4 MIL/uL (ref 4.22–5.81)
RDW: 13.3 % (ref 11.5–15.5)
WBC: 13.7 10*3/uL — ABNORMAL HIGH (ref 4.0–10.5)

## 2015-11-28 LAB — HEMOGLOBIN A1C
Hgb A1c MFr Bld: 5.4 % (ref <5.7)
Mean Plasma Glucose: 108 mg/dL (ref <117)

## 2015-11-28 LAB — C-REACTIVE PROTEIN: CRP: <0.5 mg/dL (ref <0.60)

## 2015-11-28 MED ORDER — DOXYCYCLINE HYCLATE 100 MG PO TABS: 100.0000 mg | ORAL_TABLET | Freq: Two times a day (BID) | ORAL | Status: DC

## 2015-11-28 NOTE — Progress Notes (Signed)
Subjective:    Patient ID: Hunter Reyes, male    DOB: June 20, 1976, 39 y.o.   MRN: [id number]  HPI  39 year old male presents the office today with concerns of chronic reoccurring ulcers to both of his shoe the right side worse than left. He states on the right that he gets ulcers on the bottom of his big toe as a stiff toe joint. He states that he'll get the wounds to heal as he is being treated by the wound care center however the calluses will be forming heel causes of further ulceration. He was sent for surgical evaluation. He also states his left third toes become increasingly red and he has noticed a wound at and of his toe. He states this has been ongoing for last week. He states of the issues to his feet have been ongoing for 5 years. He is previously had local wound care as well as hyperbaric oxygen treatment. He has been trying offloading shoes however he recalls unable to do this. He is inquiring of possible surgical intervention. No other complaints.  He does have neuropathy although unsure of the cause.  Review of Systems     Objective:   Physical Exam General: AAO x3, NAD  Dermatological: On the right foot there is hyperkeratotic lesion submetatarsal 15. Upon debridement there is currently no underlying ulceration at this time although there is dried blood under the callus is a pre-ulcerative. There is no swelling erythema, ascending cellulitis, fluctuance, crepitus, malodor, drainage. At the distal aspect of the left third toes a hyperkeratotic lesion. Upon agreement there is a superficial granular wound which measures 0.2 x 0.2 x 0.17 m. There is no probing, undermining, tunneling. There is erythema to the left third toe to the level of the MPJ however does not extend past the MPJ and there is no ascending cellulitis. There is no fluctuance or crepitus. No malodor.  Vascular: Dorsalis Pedis artery and Posterior Tibial artery pedal pulses are 2/4 bilateral with immedate  capillary fill time. Pedal hair growth present. No varicosities and no lower extremity edema present bilateral. There is no pain with calf compression, swelling, warmth, erythema.   Neruologic: Protective sensation With Derrel Nip Monofilament, Absent Vibratory Sensation  Musculoskeletal: There is significant prominence of submetatarsal one and 5 on the right foot and there does appear to be multiple toe contractures and chronic deformity to the right foot. There is hammertoe contractures the left foot. No areas of tenderness.  Gait: Unassisted, Nonantalgic.       Assessment & Plan:  39 year old male chronic ongoing ulcerations right foot with foot deformity as well as left third toe cellulitis with ulcer. -Treatment options discussed including all alternatives, risks, and complications -X-rays were obtained and reviewed with the patient.  There is chronic changes the second third metatarsals and was chronic fractures at the lesser digits. Multiple digital deformities are present. There is no definitive evidence of acute office or minus at this time on the right side. However given her his deformities as well as possible surgical intervention will obtain MRI to further evaluate the integrity the bone. He will likely need metatarsal head resections at this point. -At this point continue with offloading pads. -Prescribed doxycycline due to cellulitis left third toes. As previously without side effects. Continue daily dressing changes Neosporin and a bandage. -Follow-up after MRI of the right foot or in 2 weeks to evaluate left third toe. -Monitor for any clinical signs or symptoms of infection and directed to call  the office immediately should any occur or go to the ER. *x-ray left foot next appointment.   Celesta Gentile, DPM

## 2015-11-29 ENCOUNTER — Telehealth: Payer: Self-pay | Admitting: *Deleted

## 2015-11-29 DIAGNOSIS — M86671 Other chronic osteomyelitis, right ankle and foot: Secondary | ICD-10-CM

## 2015-11-29 LAB — SEDIMENTATION RATE: Sed Rate: 4 mm/hr (ref 0–15)

## 2015-11-29 NOTE — Telephone Encounter (Addendum)
-----   Message from Trula Slade, DPM sent at 11/28/2015  9:32 PM EST ----- Please order MRI of right foot to evaluate for osteomyelitis. Thanks. Orders and pt data faxed.  [facility] requires clinical sent to [phone] for evaluation for prior authorization, sent.  Informed pt of the abnormal results and told him to follow up with his primary doctor and I would fax to Dr. Rosita Fire p [phone], f [phone].  Faxed.  12/09/2015 - informed pt [facility] had denied MRI, needs to keep 12/12/2015 appt to follow up on cellulitis.

## 2015-12-01 DIAGNOSIS — L03032 Cellulitis of left toe: Secondary | ICD-10-CM | POA: Diagnosis not present

## 2015-12-01 DIAGNOSIS — L97511 Non-pressure chronic ulcer of other part of right foot limited to breakdown of skin: Secondary | ICD-10-CM | POA: Diagnosis not present

## 2015-12-01 DIAGNOSIS — L97521 Non-pressure chronic ulcer of other part of left foot limited to breakdown of skin: Secondary | ICD-10-CM | POA: Diagnosis not present

## 2015-12-01 DIAGNOSIS — G9009 Other idiopathic peripheral autonomic neuropathy: Secondary | ICD-10-CM | POA: Diagnosis not present

## 2015-12-05 ENCOUNTER — Other Ambulatory Visit: Payer: Medicaid Other

## 2015-12-12 ENCOUNTER — Ambulatory Visit (INDEPENDENT_AMBULATORY_CARE_PROVIDER_SITE_OTHER): Payer: Medicaid Other | Admitting: Podiatry

## 2015-12-12 ENCOUNTER — Encounter: Payer: Self-pay | Admitting: Podiatry

## 2015-12-12 ENCOUNTER — Ambulatory Visit (INDEPENDENT_AMBULATORY_CARE_PROVIDER_SITE_OTHER): Payer: Medicaid Other

## 2015-12-12 VITALS — BP 125/65 | HR 74 | Resp 18

## 2015-12-12 DIAGNOSIS — R52 Pain, unspecified: Secondary | ICD-10-CM

## 2015-12-12 DIAGNOSIS — L0390 Cellulitis, unspecified: Secondary | ICD-10-CM

## 2015-12-12 DIAGNOSIS — L84 Corns and callosities: Secondary | ICD-10-CM

## 2015-12-12 DIAGNOSIS — M86671 Other chronic osteomyelitis, right ankle and foot: Secondary | ICD-10-CM | POA: Diagnosis not present

## 2015-12-12 MED ORDER — DOXYCYCLINE HYCLATE 100 MG PO TABS: 100.0000 mg | ORAL_TABLET | Freq: Two times a day (BID) | ORAL | Status: DC

## 2015-12-15 DIAGNOSIS — G9009 Other idiopathic peripheral autonomic neuropathy: Secondary | ICD-10-CM | POA: Diagnosis not present

## 2015-12-15 DIAGNOSIS — L97521 Non-pressure chronic ulcer of other part of left foot limited to breakdown of skin: Secondary | ICD-10-CM | POA: Diagnosis not present

## 2015-12-15 DIAGNOSIS — L97511 Non-pressure chronic ulcer of other part of right foot limited to breakdown of skin: Secondary | ICD-10-CM | POA: Diagnosis not present

## 2015-12-15 DIAGNOSIS — L03032 Cellulitis of left toe: Secondary | ICD-10-CM | POA: Diagnosis not present

## 2015-12-16 ENCOUNTER — Encounter: Payer: Self-pay | Admitting: Podiatry

## 2015-12-16 NOTE — Progress Notes (Signed)
Patient ID: Hunter Reyes, male   DOB: [birth year], 39 y.o.   MRN: [id number]  Subjective: 39 year old male presents the office they for follow-up evaluation of left third toe cellulitis. He states that toe has decrease in the redness as well as the edema although the swelling does persist some. He has continued with antibiotics. MRI of the right foot was denied. He denies any systemic complaints as fevers, chills, nausea, vomiting. No calf pain, chest pain, shortness of breath. No other complaints at this time.  Objective: AAO 3, NAD DP/PT pulses palpable 2/4, CRT less than 3 seconds Protective sensation absent with Derrel Nip monofilament, absent vibratory sensation  On the left foot there is edema to the left third toe have the erythema does appear to be decreased. At the distal aspect is superficial almost abrasion type wound with a granular base. There is no probing, undermining or tunneling. No fluctuance or crepitus. No malodor.  On the right foot there is digital deformities as well as plantar flexion of the first metatarsal and fifth metatarsal with hyperkeratotic pre-ulcerative lesions present in his areas. Upon agreement no underlying ulceration, drainage or other signs of infection at this time.  No other open lesions or pre-ulcerative lesions.  There is no pain with calf compression, swelling, warmth, erythema.   Assessment : 39 year old male with left third toe resolving cellulitis and right foot deformity with pre-ulcerative lesions  Plan: -X-rays were obtained and reviewed with the patient.  -Treatment options discussed including all alternatives, risks, and complications -Etiology of symptoms were discussed -Continued antibiotic Celexa left side as there is resolving silence but it has decreased some. -Again discussed surgical intervention of the right foot. We'll likely consider this in January once the infection is resolved and his blood work is back to normal and  there is no signs of infection. Discussed with him pan-metatarsal head resection. -Monitor for any clinical signs or symptoms of infection and directed to call the office immediately should any occur or go to the ER. -Follow-up as scheduled or sooner if any problems arise. In the meantime, encouraged to call the office with any questions, concerns, change in symptoms.  *repeat blood work next appointment  Celesta Gentile, DPM

## 2015-12-19 ENCOUNTER — Emergency Department (HOSPITAL_COMMUNITY)
Admission: EM | Admit: 2015-12-19 | Discharge: 2015-12-19 | Disposition: A | Discharge status: Home / Self Care | Payer: Medicaid Other | Attending: Emergency Medicine | Admitting: Emergency Medicine

## 2015-12-19 ENCOUNTER — Encounter (HOSPITAL_COMMUNITY): Payer: Self-pay | Admitting: *Deleted

## 2015-12-19 ENCOUNTER — Emergency Department (HOSPITAL_COMMUNITY): Payer: Medicaid Other

## 2015-12-19 DIAGNOSIS — F17210 Nicotine dependence, cigarettes, uncomplicated: Secondary | ICD-10-CM | POA: Insufficient documentation

## 2015-12-19 DIAGNOSIS — Y9289 Other specified places as the place of occurrence of the external cause: Secondary | ICD-10-CM | POA: Diagnosis not present

## 2015-12-19 DIAGNOSIS — F319 Bipolar disorder, unspecified: Secondary | ICD-10-CM | POA: Diagnosis not present

## 2015-12-19 DIAGNOSIS — E669 Obesity, unspecified: Secondary | ICD-10-CM | POA: Insufficient documentation

## 2015-12-19 DIAGNOSIS — IMO0002 Reserved for concepts with insufficient information to code with codable children: Secondary | ICD-10-CM

## 2015-12-19 DIAGNOSIS — Y998 Other external cause status: Secondary | ICD-10-CM | POA: Diagnosis not present

## 2015-12-19 DIAGNOSIS — Z872 Personal history of diseases of the skin and subcutaneous tissue: Secondary | ICD-10-CM | POA: Insufficient documentation

## 2015-12-19 DIAGNOSIS — Z791 Long term (current) use of non-steroidal anti-inflammatories (NSAID): Secondary | ICD-10-CM | POA: Diagnosis not present

## 2015-12-19 DIAGNOSIS — F410 Panic disorder [episodic paroxysmal anxiety] without agoraphobia: Secondary | ICD-10-CM | POA: Insufficient documentation

## 2015-12-19 DIAGNOSIS — Z8719 Personal history of other diseases of the digestive system: Secondary | ICD-10-CM | POA: Diagnosis not present

## 2015-12-19 DIAGNOSIS — M19042 Primary osteoarthritis, left hand: Secondary | ICD-10-CM | POA: Diagnosis not present

## 2015-12-19 DIAGNOSIS — W25XXXA Contact with sharp glass, initial encounter: Secondary | ICD-10-CM | POA: Insufficient documentation

## 2015-12-19 DIAGNOSIS — J449 Chronic obstructive pulmonary disease, unspecified: Secondary | ICD-10-CM | POA: Insufficient documentation

## 2015-12-19 DIAGNOSIS — Z792 Long term (current) use of antibiotics: Secondary | ICD-10-CM | POA: Insufficient documentation

## 2015-12-19 DIAGNOSIS — S51812A Laceration without foreign body of left forearm, initial encounter: Secondary | ICD-10-CM | POA: Insufficient documentation

## 2015-12-19 DIAGNOSIS — G629 Polyneuropathy, unspecified: Secondary | ICD-10-CM | POA: Insufficient documentation

## 2015-12-19 DIAGNOSIS — Z23 Encounter for immunization: Secondary | ICD-10-CM | POA: Diagnosis not present

## 2015-12-19 DIAGNOSIS — Y9301 Activity, walking, marching and hiking: Secondary | ICD-10-CM | POA: Diagnosis not present

## 2015-12-19 DIAGNOSIS — M19041 Primary osteoarthritis, right hand: Secondary | ICD-10-CM | POA: Insufficient documentation

## 2015-12-19 DIAGNOSIS — Z79899 Other long term (current) drug therapy: Secondary | ICD-10-CM | POA: Insufficient documentation

## 2015-12-19 MED ORDER — HYDROCODONE-ACETAMINOPHEN 5-325 MG PO TABS
1.0000 | ORAL_TABLET | Freq: Once | ORAL | Status: AC
  Administered 2015-12-19: 1 via ORAL
  Filled 2015-12-19: qty 1

## 2015-12-19 MED ORDER — TETANUS-DIPHTH-ACELL PERTUSSIS 5-2.5-18.5 LF-MCG/0.5 IM SUSP
0.5000 mL | Freq: Once | INTRAMUSCULAR | Status: AC
  Administered 2015-12-19: 0.5 mL via INTRAMUSCULAR
  Filled 2015-12-19: qty 0.5

## 2015-12-19 MED ORDER — LIDOCAINE-EPINEPHRINE 1 %-1:200000 IJ SOLN
20.0000 mL | Freq: Once | INTRAMUSCULAR | Status: AC
Start: 2015-12-19 — End: 2015-12-19
  Administered 2015-12-19: 10 mL
  Filled 2015-12-19: qty 20

## 2015-12-19 MED ORDER — HYDROCODONE-ACETAMINOPHEN 5-325 MG PO TABS: 1.0000 | ORAL_TABLET | ORAL | Status: DC | PRN

## 2015-12-19 NOTE — ED Provider Notes (Signed)
CSN: [id number]     Arrival date & time 12/19/15  1733 History  By signing my name below, I, [facility], attest that this documentation has been prepared under the direction and in the presence of Evalee Jefferson, PA-C. Electronically Signed: Virgel Bouquet, ED Scribe. 12/19/2015. 9:50 PM.   Chief Complaint  Patient presents with  . Laceration   The history is provided by the patient. No language interpreter was used.   HPI Comments: Hunter Reyes is a 39 y.o.right handed male who presents to the Emergency Department complaining of a mildly painful laceration that occurred 2-3 hours ago. Patient reports that he was walking in the woods when he tripped and fell, lacerating his left forearm on what he believes to be broken glass, followed by bleeding and pain. He states that he rinsed the laceration with running water to remove foreign bodies and called EMS who wrapped the site. Patient is unsure of the date of his last tetanus. Allergic to Darvocet, Tramadol, Tylenol-3. He denies numbness, tingling or weakness distal to the injury site. His tetanus status is unknown. He denies any other injuries and denies hitting his head.  He does have a history of self cutting but denies this was a self inflicted injury.  PCP: Dr. Rosita Fire  Past Medical History  Diagnosis Date  . Bipolar 1 disorder ([place])   . Depression   . Panic attacks   . Obesity   . Deliberate self-cutting     history of  . DDD (degenerative disc disease), lumbar     and thoracic  . Arthritis     hands  . COPD (chronic obstructive pulmonary disease) ([place])     denies SOB with daily activities  . History of seizures     as a child - no seizures since before teenage years; states unknown cause  . Foot ulcer, right ([place]) 11/02/2013  . Peripheral neuropathy ([place])   . Non-restorable tooth 10/2013    multiple  . Osteomyelitis ([place]) 2014    right foot  . Neuropathic ulcer ([place])   . Borderline personality disorder     Past Surgical History  Procedure Laterality Date  . Inner ear surgery Left     as a child  . Multiple extractions with alveoloplasty N/A 11/17/2013    Procedure: MULTIPLE EXTRACTION WITH ALVEOLOPLASTY;  Surgeon: Gae Bon, DDS;  Location: [place];  Service: Oral Surgery;  Laterality: N/A;   No family history on file. Social History  Substance Use Topics  . Smoking status: Current Every Day Smoker -- 1.00 packs/day for 15 years    Types: Cigarettes  . Smokeless tobacco: Current User    Types: Snuff  . Alcohol Use: Yes     Comment: occasionally    Review of Systems  Gastrointestinal: Negative for nausea.  Musculoskeletal: Negative.   Skin: Positive for wound (Laceration to left forearm).  Neurological: Negative for dizziness, weakness, numbness and headaches.      Allergies  Darvocet and Tramadol  Home Medications   Prior to Admission medications   Medication Sig Start Date End Date Taking? Authorizing Provider  acetaminophen-codeine (TYLENOL #3) 300-30 MG per tablet Take 1-2 tablets by mouth every 6 (six) hours as needed for moderate pain. 10/15/14   Lily Kocher, PA-C  albuterol (PROVENTIL HFA;VENTOLIN HFA) 108 (90 BASE) MCG/ACT inhaler Inhale into the lungs every 6 (six) hours as needed for wheezing or shortness of breath.    Historical Provider, MD  amoxicillin (AMOXIL) 500 MG capsule  Take 1 capsule (500 mg total) by mouth 4 (four) times daily. 11/17/13   Diona Browner, DDS  amoxicillin-clavulanate (AUGMENTIN) 500-125 MG per tablet Take 1 tablet (500 mg total) by mouth 2 (two) times daily. For 10 days 10/04/14   Tammy Triplett, PA-C  ARIPiprazole (ABILIFY) 5 MG tablet Take 5 mg by mouth daily.    Historical Provider, MD  citalopram (CELEXA) 20 MG tablet Take 20 mg by mouth daily.    Historical Provider, MD  diclofenac (VOLTAREN) 75 MG EC tablet Take 1 tablet (75 mg total) by mouth 2 (two) times daily. 10/15/14   Lily Kocher, PA-C  doxycycline (VIBRA-TABS) 100 MG  tablet Take 1 tablet (100 mg total) by mouth 2 (two) times daily. 12/12/15   Trula Slade, DPM  gabapentin (NEURONTIN) 300 MG capsule Take 600 mg by mouth 2 (two) times daily.     Historical Provider, MD  HYDROcodone-acetaminophen (NORCO/VICODIN) 5-325 MG tablet Take 1 tablet by mouth every 4 (four) hours as needed. 12/19/15   Evalee Jefferson, PA-C  loratadine-pseudoephedrine (CLARITIN-D 24 HOUR) 10-240 MG per 24 hr tablet Take 1 tablet by mouth daily. 11/17/13   Diona Browner, DDS  oxyCODONE-acetaminophen (PERCOCET) 5-325 MG per tablet Take 1-2 tablets by mouth every 4 (four) hours as needed for severe pain. 11/17/13   Diona Browner, DDS  predniSONE (DELTASONE) 10 MG tablet Take 6 tablets day one, 5 tablets day two, 4 tablets day three, 3 tablets day four, 2 tablets day five, then 1 tablet day six 10/04/14   Tammy Triplett, PA-C  traZODone (DESYREL) 50 MG tablet Take 50 mg by mouth at bedtime.    Historical Provider, MD   BP 133/80 mmHg  Pulse 95  Temp(Src) 99.2 F (37.3 C) (Oral)  Resp 18  Ht 6\' 1"  (1.854 m)  Wt 131.543 kg  BMI 38.27 kg/m2  SpO2 98% Physical Exam  Constitutional: He is oriented to person, place, and time. He appears well-developed and well-nourished. No distress.  HENT:  Head: Normocephalic and atraumatic.  Eyes: Conjunctivae and EOM are normal.  Neck: Neck supple. No tracheal deviation present.  Cardiovascular: Normal rate.   Pulmonary/Chest: Effort normal. No respiratory distress.  Musculoskeletal: Normal range of motion.  Neurological: He is alert and oriented to person, place, and time.  Skin: Skin is warm and dry.  11 cm laceration into the subc layer of left dorsal proximal forearm, no deeper structures visualized.  No foreign body, able to visualize to base of wound. Slow venous bleeding from proximal wound edge. Distal sensation is intact. FROM of fingers and wrist without deficit.  Psychiatric: He has a normal mood and affect. His behavior is normal.  Nursing  note and vitals reviewed.   ED Course  Procedures   DIAGNOSTIC STUDIES: Oxygen Saturation is 98% on RA, normal by my interpretation.    COORDINATION OF CARE: 8:22 PM Will perform a laceration repair. Discussed treatment plan with pt at bedside and pt agreed to plan.  LACERATION REPAIR PROCEDURE NOTE The patient's identification was confirmed and consent was obtained. This procedure was performed by Evalee Jefferson, PA-C at 9:44 PM. Site: left forearm Sterile procedures observed Yes Anesthetic used (type and amt): 1% lidocaine epi, 10 cc Suture type/size: 4-0 vicryl rapide simple interrupted, #5 for subcutaneous layer, 13 staples for the outer layer Length: 11 cm # of Sutures: 13 staples Technique: staples Complexity moderate Antibx ointment applied No Tetanus UTD or ordered Yes Site anesthetized, irrigated with NS, explored without evidence of foreign body,  wound well approximated, site covered with dry, sterile dressing.  Patient tolerated procedure well without complications. Instructions for care discussed verbally and patient provided with additional written instructions for homecare and f/u.  Labs Review Labs Reviewed - No data to display  Imaging Review Dg Forearm Left  12/19/2015  CLINICAL DATA:  39 year old male with fall and laceration on forearm. EXAM: LEFT FOREARM - 2 VIEW COMPARISON:  None. FINDINGS: There is no evidence of fracture or other focal bone lesions. Soft tissues are unremarkable. IMPRESSION: Negative. Electronically Signed   By: Anner Crete M.D.   On: 12/19/2015 20:57   I have personally reviewed and evaluated these images and lab results as part of my medical decision-making.   EKG Interpretation None      MDM   Final diagnoses:  Laceration    Wound care instructions given.  Pt advised to have sutures removed in 10 days,  Return here sooner for any signs of infection including redness, swelling, worse pain or drainage of pus. Tetanus  updated. Pt prescribed hydrocodone, dressing placed.    I personally performed the services described in this documentation, which was scribed in my presence. The recorded information has been reviewed and is accurate.   Evalee Jefferson, PA-C 12/20/15 1303  Ripley Fraise, MD 12/20/15 [phone]

## 2015-12-19 NOTE — ED Notes (Signed)
Pt was brought in by EMS. Pt fell on a beer bottle while walking through the woods. Pt denies being on any blood thinners.   BP 144/79, HR 101, O2 98

## 2015-12-27 ENCOUNTER — Encounter: Payer: Self-pay | Admitting: Podiatry

## 2015-12-27 ENCOUNTER — Ambulatory Visit (INDEPENDENT_AMBULATORY_CARE_PROVIDER_SITE_OTHER): Payer: Medicaid Other | Admitting: Podiatry

## 2015-12-27 VITALS — BP 118/73 | HR 67 | Resp 12

## 2015-12-27 DIAGNOSIS — L0390 Cellulitis, unspecified: Secondary | ICD-10-CM

## 2015-12-27 DIAGNOSIS — L03039 Cellulitis of unspecified toe: Secondary | ICD-10-CM

## 2015-12-27 DIAGNOSIS — M86671 Other chronic osteomyelitis, right ankle and foot: Secondary | ICD-10-CM

## 2015-12-27 DIAGNOSIS — L84 Corns and callosities: Secondary | ICD-10-CM

## 2015-12-28 ENCOUNTER — Encounter: Payer: Self-pay | Admitting: Podiatry

## 2015-12-28 LAB — CBC WITH DIFFERENTIAL/PLATELET
BASOS ABS: 0.1 10*3/uL (ref 0.0–0.1)
Basophils Relative: 1 % (ref 0–1)
Eosinophils Absolute: 0.2 10*3/uL (ref 0.0–0.7)
Eosinophils Relative: 2 % (ref 0–5)
HCT: 44.7 % (ref 39.0–52.0)
HEMOGLOBIN: 15.8 g/dL (ref 13.0–17.0)
LYMPHS ABS: 3.7 10*3/uL (ref 0.7–4.0)
Lymphocytes Relative: 30 % (ref 12–46)
MCH: 30.4 pg (ref 26.0–34.0)
MCHC: 35.3 g/dL (ref 30.0–36.0)
MCV: 86 fL (ref 78.0–100.0)
MONOS PCT: 15 % — AB (ref 3–12)
MPV: 9.6 fL (ref 8.6–12.4)
Monocytes Absolute: 1.8 10*3/uL — ABNORMAL HIGH (ref 0.1–1.0)
NEUTROS PCT: 52 % (ref 43–77)
Neutro Abs: 6.4 10*3/uL (ref 1.7–7.7)
Platelets: 259 10*3/uL (ref 150–400)
RBC: 5.2 MIL/uL (ref 4.22–5.81)
RDW: 13.9 % (ref 11.5–15.5)
WBC: 12.3 10*3/uL — ABNORMAL HIGH (ref 4.0–10.5)

## 2015-12-28 LAB — C-REACTIVE PROTEIN: CRP: <0.5 mg/dL (ref <0.60)

## 2015-12-28 NOTE — Progress Notes (Signed)
Patient ID: Hunter Reyes, male   DOB: 03/01/1976, 40 y.o.   MRN: [id number]  Subjective: 40 year old male presents the office they for follow-up evaluation of left third toe cellulitis.  He  States that he discontinued some pain in the left third toe although it is improving as well as the swelling and redness. He has finished antibiotics. He does continue calluses to the bottom of his right foot. He denies any drainage or pus coming from these lesions. He does continue with offloading issues. He denies any systemic complaints as fevers, chills, nausea, vomiting. No calf pain, chest pain, shortness of breath. No other complaints at this time.  Objective: AAO 3, NAD DP/PT pulses palpable 2/4, CRT less than 3 seconds Protective sensation absent with Derrel Nip monofilament, absent vibratory sensation  On the left foot there is  Improved edema as well as erythema. There is a superficial wound of the distal aspect of the toe without any probing or otherwise.  No fluctuance or crepitus. No malodor. No drainage. On the right foot there is digital deformities as well as plantar flexion of the first metatarsal and fifth metatarsal with hyperkeratotic pre-ulcerative lesions present in his areas. Upon  debridementno underlying ulceration, drainage or other signs of infection at this time.  No other open lesions or pre-ulcerative lesions.  There is no pain with calf compression, swelling, warmth, erythema.   Assessment : 40 year old male with left third toe resolving cellulitis and right foot deformity with pre-ulcerative lesions and foot deformity to the left foot.   Plan: -Treatment options discussed including all alternatives, risks, and complications -Will hold off on further antibiotic cement this time as the infection appears resolving the left third toe. If any worsening to call the office.  -Pre-ulcerative calluses  On the right foot were debrided without complications or bleeding. No  underlying signs of infection to the right foot.  Again discuss surgical intervention however we'll await further infection to resolve the left side before proceeding  With surgical intervention. -Blood work re-ordered today.  -Monitor for any clinical signs or symptoms of infection and directed to call the office immediately should any occur or go to the ER. -Follow-up as scheduled or sooner if any problems arise. In the meantime, encouraged to call the office with any questions, concerns, change in symptoms.  *x-ray left foot next appointment.   Celesta Gentile, DPM

## 2015-12-29 ENCOUNTER — Telehealth: Payer: Self-pay | Admitting: *Deleted

## 2015-12-29 ENCOUNTER — Encounter (HOSPITAL_BASED_OUTPATIENT_CLINIC_OR_DEPARTMENT_OTHER): Payer: Medicaid Other | Attending: Internal Medicine

## 2015-12-29 DIAGNOSIS — J449 Chronic obstructive pulmonary disease, unspecified: Secondary | ICD-10-CM | POA: Insufficient documentation

## 2015-12-29 DIAGNOSIS — L84 Corns and callosities: Secondary | ICD-10-CM | POA: Diagnosis not present

## 2015-12-29 DIAGNOSIS — G9009 Other idiopathic peripheral autonomic neuropathy: Secondary | ICD-10-CM | POA: Diagnosis not present

## 2015-12-29 DIAGNOSIS — L97511 Non-pressure chronic ulcer of other part of right foot limited to breakdown of skin: Secondary | ICD-10-CM | POA: Diagnosis not present

## 2015-12-29 DIAGNOSIS — L97521 Non-pressure chronic ulcer of other part of left foot limited to breakdown of skin: Secondary | ICD-10-CM | POA: Diagnosis not present

## 2015-12-29 DIAGNOSIS — M1990 Unspecified osteoarthritis, unspecified site: Secondary | ICD-10-CM | POA: Diagnosis not present

## 2015-12-29 DIAGNOSIS — L03032 Cellulitis of left toe: Secondary | ICD-10-CM | POA: Insufficient documentation

## 2015-12-29 DIAGNOSIS — G40909 Epilepsy, unspecified, not intractable, without status epilepticus: Secondary | ICD-10-CM | POA: Insufficient documentation

## 2015-12-29 LAB — HEMOGLOBIN A1C
Hgb A1c MFr Bld: 5.5 % (ref <5.7)
Mean Plasma Glucose: 111 mg/dL (ref <117)

## 2015-12-29 LAB — SEDIMENTATION RATE: Sed Rate: 1 mm/h (ref 0–15)

## 2015-12-29 MED ORDER — DOXYCYCLINE HYCLATE 100 MG PO TABS: 100.0000 mg | ORAL_TABLET | Freq: Two times a day (BID) | ORAL | Status: DC

## 2015-12-29 NOTE — Telephone Encounter (Signed)
Please refill  thanks

## 2015-12-29 NOTE — Telephone Encounter (Addendum)
Pt states Dr. Jacqualyn Posey was going to refill the Doxycycline, but the refill was not at the [facility] in [place].  Dr. Jacqualyn Posey refilled Doxycycline as previous.  Informed pt and he also asked if his bloodwork was complete and I told him, once Dr. Jacqualyn Posey had reviewed I would call with instructions.  Pt states Dr. Jacqualyn Posey had said if the labs were good, he would be able to set up surgery.  Dr. Jacqualyn Posey states WBC of 12/27/2015 are still high, will wait until within normal limits.

## 2015-12-29 NOTE — Telephone Encounter (Signed)
WBC is still elevated, will wait until everything normalizes. Thanks.

## 2016-01-12 DIAGNOSIS — L97511 Non-pressure chronic ulcer of other part of right foot limited to breakdown of skin: Secondary | ICD-10-CM | POA: Diagnosis not present

## 2016-01-17 ENCOUNTER — Ambulatory Visit: Payer: Medicaid Other | Admitting: Podiatry

## 2016-01-17 ENCOUNTER — Encounter: Payer: Self-pay | Admitting: Podiatry

## 2016-01-23 ENCOUNTER — Encounter: Payer: Self-pay | Admitting: Podiatry

## 2016-01-23 ENCOUNTER — Ambulatory Visit (INDEPENDENT_AMBULATORY_CARE_PROVIDER_SITE_OTHER): Payer: Medicaid Other | Admitting: Podiatry

## 2016-01-23 ENCOUNTER — Ambulatory Visit (INDEPENDENT_AMBULATORY_CARE_PROVIDER_SITE_OTHER): Payer: Medicaid Other

## 2016-01-23 VITALS — BP 132/86 | HR 89 | Resp 18

## 2016-01-23 DIAGNOSIS — M86671 Other chronic osteomyelitis, right ankle and foot: Secondary | ICD-10-CM

## 2016-01-23 DIAGNOSIS — L03032 Cellulitis of left toe: Secondary | ICD-10-CM

## 2016-01-23 DIAGNOSIS — L0390 Cellulitis, unspecified: Secondary | ICD-10-CM

## 2016-01-23 DIAGNOSIS — L84 Corns and callosities: Secondary | ICD-10-CM

## 2016-01-23 DIAGNOSIS — R52 Pain, unspecified: Secondary | ICD-10-CM

## 2016-01-23 MED ORDER — SULFAMETHOXAZOLE-TRIMETHOPRIM 800-160 MG PO TABS: 1.0000 | ORAL_TABLET | Freq: Two times a day (BID) | ORAL | Status: DC

## 2016-01-23 MED ORDER — HYDROCODONE-ACETAMINOPHEN 5-325 MG PO TABS: 1.0000 | ORAL_TABLET | ORAL | Status: DC | PRN

## 2016-01-26 ENCOUNTER — Other Ambulatory Visit: Payer: Self-pay | Admitting: Podiatry

## 2016-01-26 ENCOUNTER — Encounter (HOSPITAL_BASED_OUTPATIENT_CLINIC_OR_DEPARTMENT_OTHER): Payer: Medicaid Other | Attending: Internal Medicine

## 2016-01-26 ENCOUNTER — Other Ambulatory Visit: Payer: Self-pay | Admitting: Internal Medicine

## 2016-01-26 DIAGNOSIS — G40909 Epilepsy, unspecified, not intractable, without status epilepticus: Secondary | ICD-10-CM | POA: Insufficient documentation

## 2016-01-26 DIAGNOSIS — L84 Corns and callosities: Secondary | ICD-10-CM | POA: Insufficient documentation

## 2016-01-26 DIAGNOSIS — L03032 Cellulitis of left toe: Secondary | ICD-10-CM | POA: Insufficient documentation

## 2016-01-26 DIAGNOSIS — G9009 Other idiopathic peripheral autonomic neuropathy: Secondary | ICD-10-CM | POA: Insufficient documentation

## 2016-01-26 DIAGNOSIS — L97511 Non-pressure chronic ulcer of other part of right foot limited to breakdown of skin: Secondary | ICD-10-CM | POA: Insufficient documentation

## 2016-01-26 DIAGNOSIS — M869 Osteomyelitis, unspecified: Secondary | ICD-10-CM | POA: Diagnosis not present

## 2016-01-26 DIAGNOSIS — L97521 Non-pressure chronic ulcer of other part of left foot limited to breakdown of skin: Secondary | ICD-10-CM | POA: Insufficient documentation

## 2016-01-26 DIAGNOSIS — J449 Chronic obstructive pulmonary disease, unspecified: Secondary | ICD-10-CM | POA: Diagnosis not present

## 2016-01-26 LAB — WOUND CULTURE: Gram Stain: NONE SEEN

## 2016-01-27 ENCOUNTER — Encounter: Payer: Self-pay | Admitting: Podiatry

## 2016-01-27 NOTE — Progress Notes (Signed)
Patient ID: Hunter Reyes, male   DOB: 15-Dec-1976, 40 y.o.   MRN: [id number]  Subjective: 40 year old male presents the office they for follow-up evaluation of left third toe cellulitis.  He states the area appears to be worsening. There is no small amount purulent drainage expressed from the toe and he states that he does have osteomyelitis. He has had this before he states this is how it started. He states the toe is still swollen and red although there is no red streaks and is on extend past the toe. He has been continuing antibiotics.  He denies any systemic complaints as fevers, chills, nausea, vomiting. No calf pain, chest pain, shortness of breath. No other complaints at this time.  Objective: AAO 3, NAD DP/PT pulses palpable 2/4, CRT less than 3 seconds Protective sensation absent with Derrel Nip monofilament, absent vibratory sensation  On the left foot at the distal aspect of the third toe there is an ulceration today which does probe very close to bone although does not expend this time. There is no undermining or tunneling. The wound measures approximately 0.4 x 0.4 cm. There is a small amount of purulence expressed from the wound today. The wound was cultured. There is edema and erythema to the toe without any ascending cellulitis. There is no increase in warmth. There is no swelling or warmth the foot. On the right foot there is digital deformities as well as plantar flexion of the first metatarsal and fifth metatarsal with hyperkeratotic pre-ulcerative lesions present in his areas. Upon debridement no underlying ulceration, drainage or other signs of infection at this time.  No other open lesions or pre-ulcerative lesions.  There is no pain with calf compression, swelling, warmth, erythema.   Assessment : 40 year old male with left third toe cellulitis and right foot deformity with pre-ulcerative lesions and foot deformity to the left foot.   Plan: -X-rays were obtained and  reviewed with the patient. Given the digital deformities is difficult to value for osteomyelitis of the toe. There is no soft tissue emphysema. -Treatment options discussed including all alternatives, risks, and complications -At this time we'll change antibiotics to Bactrim as he is been on this before and did well. Atenolol discussion the patient in regards to surgical intervention for the toe including amputation. He understands this is a high likelihood. I also discussed with him going to the ER his symptoms are worsening. He did try the new antibiotic to see if this helps first. If not strongly encouraged him to go. He understands the most likely has told me to be amputated. -Pre-ulcerative calluses on the right foot were debrided without complications or bleeding. No underlying signs of infection to the right foot.  Again discuss surgical intervention however we'll await further infection to resolve the left side before proceeding  With surgical intervention. -Monitor for any clinical signs or symptoms of infection and directed to call the office immediately should any occur or go to the ER. -Follow-up as scheduled or sooner if any problems arise. In the meantime, encouraged to call the office with any questions, concerns, change in symptoms.   Celesta Gentile, DPM

## 2016-02-03 ENCOUNTER — Telehealth: Payer: Self-pay | Admitting: *Deleted

## 2016-02-03 NOTE — Telephone Encounter (Signed)
Shanon Brow requested x-ray report for pre-cert of MRI.  Gave results verbally and faxed to [facility].

## 2016-02-06 ENCOUNTER — Ambulatory Visit (HOSPITAL_COMMUNITY): Admission: RE | Admit: 2016-02-06 | Payer: Medicaid Other | Source: Ambulatory Visit

## 2016-02-06 ENCOUNTER — Encounter: Payer: Self-pay | Admitting: Podiatry

## 2016-02-06 ENCOUNTER — Ambulatory Visit (INDEPENDENT_AMBULATORY_CARE_PROVIDER_SITE_OTHER): Payer: Medicaid Other | Admitting: Podiatry

## 2016-02-06 VITALS — BP 120/59 | HR 89 | Resp 16

## 2016-02-06 DIAGNOSIS — L97522 Non-pressure chronic ulcer of other part of left foot with fat layer exposed: Secondary | ICD-10-CM

## 2016-02-06 DIAGNOSIS — L97511 Non-pressure chronic ulcer of other part of right foot limited to breakdown of skin: Secondary | ICD-10-CM

## 2016-02-06 DIAGNOSIS — L03032 Cellulitis of left toe: Secondary | ICD-10-CM | POA: Diagnosis not present

## 2016-02-06 MED ORDER — OXYCODONE-ACETAMINOPHEN 5-325 MG PO TABS: 1.0000 | ORAL_TABLET | Freq: Four times a day (QID) | ORAL | Status: DC | PRN

## 2016-02-07 LAB — BASIC METABOLIC PANEL
BUN: 7 mg/dL (ref 7–25)
CALCIUM: 8.9 mg/dL (ref 8.6–10.3)
CHLORIDE: 103 mmol/L (ref 98–110)
CO2: 25 mmol/L (ref 20–31)
CREATININE: 0.97 mg/dL (ref 0.60–1.35)
Glucose, Bld: 90 mg/dL (ref 65–99)
Potassium: 4.5 mmol/L (ref 3.5–5.3)
Sodium: 136 mmol/L (ref 135–146)

## 2016-02-07 LAB — CBC WITH DIFFERENTIAL/PLATELET
BASOS ABS: 0.2 10*3/uL — AB (ref 0.0–0.1)
BASOS PCT: 1 % (ref 0–1)
EOS ABS: 0.8 10*3/uL — AB (ref 0.0–0.7)
Eosinophils Relative: 5 % (ref 0–5)
HCT: 46.4 % (ref 39.0–52.0)
HEMOGLOBIN: 15.7 g/dL (ref 13.0–17.0)
Lymphocytes Relative: 30 % (ref 12–46)
Lymphs Abs: 4.7 10*3/uL — ABNORMAL HIGH (ref 0.7–4.0)
MCH: 29.9 pg (ref 26.0–34.0)
MCHC: 33.8 g/dL (ref 30.0–36.0)
MCV: 88.4 fL (ref 78.0–100.0)
MPV: 9.4 fL (ref 8.6–12.4)
Monocytes Absolute: 2 10*3/uL — ABNORMAL HIGH (ref 0.1–1.0)
Monocytes Relative: 13 % — ABNORMAL HIGH (ref 3–12)
NEUTROS PCT: 51 % (ref 43–77)
Neutro Abs: 8 10*3/uL — ABNORMAL HIGH (ref 1.7–7.7)
PLATELETS: 297 10*3/uL (ref 150–400)
RBC: 5.25 MIL/uL (ref 4.22–5.81)
RDW: 13.7 % (ref 11.5–15.5)
WBC: 15.6 10*3/uL — ABNORMAL HIGH (ref 4.0–10.5)

## 2016-02-07 LAB — HEMOGLOBIN A1C
HEMOGLOBIN A1C: 5.6 % (ref <5.7)
MEAN PLASMA GLUCOSE: 114 mg/dL (ref <117)

## 2016-02-07 LAB — SEDIMENTATION RATE: SED RATE: 1 mm/h (ref 0–15)

## 2016-02-07 LAB — C-REACTIVE PROTEIN: CRP: <0.5 mg/dL (ref <0.60)

## 2016-02-08 ENCOUNTER — Encounter: Payer: Self-pay | Admitting: Podiatry

## 2016-02-08 NOTE — Progress Notes (Signed)
Patient ID: Hunter Reyes, male   DOB: [birth year], 40 y.o.   MRN: [id number]  Subjective: 40 year old male presents the office they for follow-up evaluation of left third toe cellulitis. He's been on the Bactrim and feels it is his been helping. He is scheduled for an MRIof the left foot that was ordered by the wound care center to rule out osteomyelitis. He also states the calluses the right foot thicker but denies any surrounding redness or drainage. No recent injury or trauma. He denies any systemic complaints as fevers, chills, nausea, vomiting. No calf pain, chest pain, shortness of breath. No other complaints at this time.  Objective: AAO 3, NAD DP/PT pulses palpable 2/4, CRT less than 3 seconds Protective sensation absent with Derrel Nip monofilament, absent vibratory sensation  On the left foot at the distal aspect of the third toe there is an ulceration today which does probe very close to bone although does not expend this time. There is no undermining or tunneling. The wound measures approximately 0.3 x 0.3 Cm. there is no purulence or drainage expressed today.Erythema appears to be receding. There is no ascending cellulitis is decreased to the toe.  on the right foot is hyperkeratotic lesion submetatarsal one and 5. Upon debridement submetatarsal one small mild serous drainage was expressed and there is no pus. There is no swelling erythema, ascending cellulitis. Small superficial wound that plantar aspect submetatarsal one left foot. No probing to bone, undermining or tunneling. No other open lesions or pre-ulcer lesions identified bilaterally.  On the right foot there is digital deformities as well as plantar flexion of the first metatarsal and fifth metatarsal. Upon debridement no underlying ulceration, drainage or other signs of infection at this time.  No other open lesions or pre-ulcerative lesions.  There is no pain with calf compression, swelling, warmth, erythema.    Assessment : 40 year old male with left third toe cellulitis and right foot deformity with submetatarsal 1 ulceration  deformity to the left foot.   Plan: -Treatment options discussed including all alternatives, risks, and complications - Continue to monitor for s/s of infection. Continue with local wound care for now. Awaiting MRI of the left foot. I discussed possible amputation of this toe pending MRI if there is osteomyelitis. -Regards the right foot continued with local wound care. Monitor for signs or symptoms of infection. Discussed surgical intervention however only to hold off on surgery until the infection resolves. -Reviewed blood work today. -Percocet for pain. -Monitor for any clinical signs or symptoms of infection and directed to call the office immediately should any occur or go to the ER. -Follow-up as scheduled or sooner if any problems arise. In the meantime, encouraged to call the office with any questions, concerns, change in symptoms.   Celesta Gentile, DPM

## 2016-02-09 ENCOUNTER — Telehealth: Payer: Self-pay | Admitting: *Deleted

## 2016-02-09 NOTE — Telephone Encounter (Addendum)
-----   Message from Trula Slade, DPM sent at 02/08/2016  5:06 PM EST ----- Please let him know that his WBC actually increased. Awaiting MRI (wound care ordered) to look for bone infection.   02/09/2016-DR. WAGONER REVIEWED PT'S BLOODWORK of 02/06/2016 and states the WBC have come up some.  Pt states [facility] is having to appeal their MRI orders.  Faxed our clinicals to [facility] at [facility].

## 2016-02-09 NOTE — Telephone Encounter (Signed)
Tell them to send our notes to in order to help get it approved.

## 2016-02-10 ENCOUNTER — Telehealth: Payer: Self-pay | Admitting: *Deleted

## 2016-02-10 DIAGNOSIS — L97511 Non-pressure chronic ulcer of other part of right foot limited to breakdown of skin: Secondary | ICD-10-CM | POA: Diagnosis not present

## 2016-02-10 MED ORDER — OXYCODONE-ACETAMINOPHEN 5-325 MG PO TABS: 1.0000 | ORAL_TABLET | Freq: Four times a day (QID) | ORAL | Status: DC | PRN

## 2016-02-10 NOTE — Telephone Encounter (Signed)
Pt states he is hurting and it keeps him up, request refill of Percocet.  Dr. Jacqualyn Posey states refill as previously.  Orders call to pt and he will pick the medication up in the [place] office.

## 2016-02-13 ENCOUNTER — Ambulatory Visit (INDEPENDENT_AMBULATORY_CARE_PROVIDER_SITE_OTHER): Payer: Medicaid Other | Admitting: Podiatry

## 2016-02-13 ENCOUNTER — Encounter: Payer: Self-pay | Admitting: Podiatry

## 2016-02-13 ENCOUNTER — Ambulatory Visit (INDEPENDENT_AMBULATORY_CARE_PROVIDER_SITE_OTHER): Payer: Medicaid Other

## 2016-02-13 VITALS — BP 133/69 | HR 88 | Resp 18

## 2016-02-13 DIAGNOSIS — L03032 Cellulitis of left toe: Secondary | ICD-10-CM | POA: Diagnosis not present

## 2016-02-13 DIAGNOSIS — M86179 Other acute osteomyelitis, unspecified ankle and foot: Secondary | ICD-10-CM | POA: Diagnosis not present

## 2016-02-13 DIAGNOSIS — L97511 Non-pressure chronic ulcer of other part of right foot limited to breakdown of skin: Secondary | ICD-10-CM

## 2016-02-13 MED ORDER — CIPROFLOXACIN HCL 500 MG PO TABS: 500.0000 mg | ORAL_TABLET | Freq: Two times a day (BID) | ORAL | Status: DC

## 2016-02-14 ENCOUNTER — Encounter: Payer: Self-pay | Admitting: Podiatry

## 2016-02-14 ENCOUNTER — Telehealth: Payer: Self-pay | Admitting: *Deleted

## 2016-02-14 DIAGNOSIS — R609 Edema, unspecified: Secondary | ICD-10-CM

## 2016-02-14 DIAGNOSIS — M79672 Pain in left foot: Secondary | ICD-10-CM

## 2016-02-14 DIAGNOSIS — M86171 Other acute osteomyelitis, right ankle and foot: Secondary | ICD-10-CM

## 2016-02-14 MED ORDER — OXYCODONE-ACETAMINOPHEN 5-325 MG PO TABS: 1.0000 | ORAL_TABLET | Freq: Four times a day (QID) | ORAL | Status: DC | PRN

## 2016-02-14 NOTE — Telephone Encounter (Signed)
Lets do a venous duplex to rule out DVT. OK to give 20 percocet 1 tab PO q6h prn pain

## 2016-02-14 NOTE — Telephone Encounter (Addendum)
-----   Message from Trula Slade, DPM sent at 02/14/2016  7:14 AM EST ----- Can you please order an MRI of the right foot to r/o osteomyelitis. It was previously denied but new wound has formed. He is already scheduled at [facility] for the left side that was ordered by wound care. 02/14/2016-PT STATES HE WAS TO GET Percocet today, that Dr. Jacqualyn Posey said it was too early yesterday, and he was to have MRI and [place].  [place] FURTHER EVALUATION for pre-cert for right foot MRI, clinicals are faxed to [phone].  I told pt I would follow up with Dr. Jacqualyn Posey and call again.  INFORMED PT THE PERCOCET would need to be picked up in the [place] office, and we were trying to get his MRI Pre-certed.  02/14/2016-DR. WAGONER ORDERED VENOUS doppler left leg r/o DVT.  [facility] REQUIRES MORE INFORMATION FOR EVALUATION Pre-cert, clinicals are faxed [phone].  02/15/2016-[facility] APPROVED MRI 16109 RIGHT FOOT - AUTHORIZATION# [id number], VALID 02/14/2016 TO 03/15/2016. FAXED TO [facility].   02/15/2016-[facility] APPROVED VENOUS DOPPLER [id number] OF RIGHT LEG, R/O DVT, AUTHORIZATION#[id number], VALID 02/14/2016 TO 03/15/2016. FAXED TO [facility]. Left message informing pt of Venous Doppler approval and left appt line for [facility].  02/20/2016-DR. Alger LABS 02/17/2016 WERE looking better.  Pt states understanding and request more pain medication, stating it has been over 4 days.  Pt called asked if Dr. Jacqualyn Posey had ordered the pain medication, and could he pick it up tomorrow.  Referral to pain management.  02/27/2016-PT REQUEST REFILL of pain medication to get him to the surgery on 02/29/2016.

## 2016-02-14 NOTE — Progress Notes (Signed)
Patient ID: Hunter Reyes, male   DOB: 1976-09-23, 40 y.o.   MRN: [id number]  Subjective: 40 year old male presents the office they for follow-up evaluation of left third toe cellulitis. He is continued on Bactrim. He states his third toe on the left side continues to be very painful as well as the calluses and wound on the right foot. He is taking Percocet for pain which seems to help. He states of the redness to the third toe has gotten somewhat better although does remain red and there is a wound at the tip of the toe. Denies any drainage or pus. He is also still following up with the wound care center. He denies any systemic complaints as fevers, chills, nausea, vomiting. No calf pain, chest pain, shortness of breath. No other complaints at this time.  Objective: AAO 3, NAD DP/PT pulses palpable 2/4, CRT less than 3 seconds Protective sensation absent with Derrel Nip monofilament, absent vibratory sensation  On the left foot at the distal aspect of the third toe there is an ulceration today which does probe very close to bone although does not expend this time. The wound appears to be about unchanged compared to last appointment. There is no undermining or tunneling. The wound measures approximately 0.3 x 0.3 cm. there is no purulence or drainage expressed today. Erythema appears to be receding but does continue. Has not done past the MPJ. There is no ascending cellulitis. On the right foot is hyperkeratotic lesion submetatarsal one and 5. Upon debridement submetatarsal one there is no drainage expressed however there is an ulceration measuring 2.5 x 2.5 cm in a superficial the macerated wound bed. There is no probing, undermining or tunneling. No swelling erythema, ascending saline disc, fluctuation, crepitus, malodor or drainage. Hyperkeratotic lesion right foot submetatarsal 5. No underlying ulceration, drainage or other signs of infection. No other open lesions or pre-ulcerative lesions are  identified today. There is no pain with calf compression, swelling, warmth, erythema.   Assessment : 40 year old male with left third toe cellulitis and right foot deformity with submetatarsal 1 ulceration  deformity to the left foot.   Plan: -Treatment options discussed including all alternatives, risks, and complications - Continue to monitor for s/s of infection. Continue with local wound care for now. Awaiting MRI of the left foot. I discussed possible amputation of this toe pending MRI if there is osteomyelitis. Given the one on the right foot also order the MRI for the right foot. -Regards the right foot continued with local wound care. Monitor for signs or symptoms of infection. Discussed surgical intervention however only to hold off on surgery until the infection resolves. -Repeated blood work today. -I discussed likely amputation of the toe given the continued infection however we will await the results the MRI. -Percocet for pain. He had this refill on Friday. -Monitor for any clinical signs or symptoms of infection and directed to call the office immediately should any occur or go to the ER. -Follow-up as scheduled or sooner if any problems arise. In the meantime, encouraged to call the office with any questions, concerns, change in symptoms.   Celesta Gentile, DPM

## 2016-02-16 ENCOUNTER — Ambulatory Visit (HOSPITAL_COMMUNITY)
Admission: RE | Admit: 2016-02-16 | Discharge: 2016-02-16 | Disposition: A | Discharge status: Home / Self Care | Payer: Medicaid Other | Source: Ambulatory Visit | Attending: Cardiology | Admitting: Cardiology

## 2016-02-16 DIAGNOSIS — R609 Edema, unspecified: Secondary | ICD-10-CM | POA: Diagnosis not present

## 2016-02-17 DIAGNOSIS — L97511 Non-pressure chronic ulcer of other part of right foot limited to breakdown of skin: Secondary | ICD-10-CM | POA: Diagnosis not present

## 2016-02-18 LAB — CBC WITH DIFFERENTIAL/PLATELET
BASOS PCT: 1 % (ref 0–1)
Basophils Absolute: 0.1 10*3/uL (ref 0.0–0.1)
EOS ABS: 0.3 10*3/uL (ref 0.0–0.7)
EOS PCT: 3 % (ref 0–5)
HCT: 45.1 % (ref 39.0–52.0)
Hemoglobin: 15.4 g/dL (ref 13.0–17.0)
Lymphocytes Relative: 27 % (ref 12–46)
Lymphs Abs: 2.8 10*3/uL (ref 0.7–4.0)
MCH: 30.1 pg (ref 26.0–34.0)
MCHC: 34.1 g/dL (ref 30.0–36.0)
MCV: 88.1 fL (ref 78.0–100.0)
MONO ABS: 1.3 10*3/uL — AB (ref 0.1–1.0)
MONOS PCT: 12 % (ref 3–12)
MPV: 9.3 fL (ref 8.6–12.4)
Neutro Abs: 6 10*3/uL (ref 1.7–7.7)
Neutrophils Relative %: 57 % (ref 43–77)
PLATELETS: 259 10*3/uL (ref 150–400)
RBC: 5.12 MIL/uL (ref 4.22–5.81)
RDW: 14.1 % (ref 11.5–15.5)
WBC: 10.5 10*3/uL (ref 4.0–10.5)

## 2016-02-18 LAB — SEDIMENTATION RATE: SED RATE: 1 mm/h (ref 0–15)

## 2016-02-18 LAB — C-REACTIVE PROTEIN

## 2016-02-20 ENCOUNTER — Ambulatory Visit (HOSPITAL_COMMUNITY)
Admission: RE | Admit: 2016-02-20 | Discharge: 2016-02-20 | Disposition: A | Discharge status: Home / Self Care | Payer: Medicaid Other | Source: Ambulatory Visit | Attending: Internal Medicine | Admitting: Internal Medicine

## 2016-02-20 DIAGNOSIS — M85872 Other specified disorders of bone density and structure, left ankle and foot: Secondary | ICD-10-CM | POA: Diagnosis not present

## 2016-02-20 DIAGNOSIS — F439 Reaction to severe stress, unspecified: Secondary | ICD-10-CM | POA: Diagnosis not present

## 2016-02-20 DIAGNOSIS — L03039 Cellulitis of unspecified toe: Secondary | ICD-10-CM | POA: Insufficient documentation

## 2016-02-20 DIAGNOSIS — M868X7 Other osteomyelitis, ankle and foot: Secondary | ICD-10-CM | POA: Diagnosis not present

## 2016-02-20 DIAGNOSIS — L97521 Non-pressure chronic ulcer of other part of left foot limited to breakdown of skin: Secondary | ICD-10-CM | POA: Insufficient documentation

## 2016-02-20 MED ORDER — GADOBENATE DIMEGLUMINE 529 MG/ML IV SOLN
20.0000 mL | Freq: Once | INTRAVENOUS | Status: AC | PRN
  Administered 2016-02-20: 20 mL via INTRAVENOUS

## 2016-02-20 NOTE — Telephone Encounter (Signed)
Can refill- can you also put in for pain management

## 2016-02-20 NOTE — Telephone Encounter (Signed)
-----   Message from Trula Slade, DPM sent at 02/20/2016  7:50 AM EST ----- Please let him know that his labs are improved.

## 2016-02-21 ENCOUNTER — Encounter: Payer: Self-pay | Admitting: Podiatry

## 2016-02-21 ENCOUNTER — Inpatient Hospital Stay: Admission: RE | Admit: 2016-02-21 | Payer: Medicaid Other | Source: Ambulatory Visit

## 2016-02-21 ENCOUNTER — Ambulatory Visit (INDEPENDENT_AMBULATORY_CARE_PROVIDER_SITE_OTHER): Payer: Medicaid Other | Admitting: Podiatry

## 2016-02-21 VITALS — BP 126/81 | HR 88 | Resp 16

## 2016-02-21 DIAGNOSIS — M86171 Other acute osteomyelitis, right ankle and foot: Secondary | ICD-10-CM

## 2016-02-21 DIAGNOSIS — L97511 Non-pressure chronic ulcer of other part of right foot limited to breakdown of skin: Secondary | ICD-10-CM | POA: Diagnosis not present

## 2016-02-21 MED ORDER — OXYCODONE-ACETAMINOPHEN 5-325 MG PO TABS: 1.0000 | ORAL_TABLET | Freq: Four times a day (QID) | ORAL | Status: DC | PRN

## 2016-02-21 NOTE — Patient Instructions (Signed)

## 2016-02-22 ENCOUNTER — Encounter: Payer: Self-pay | Admitting: Podiatry

## 2016-02-22 NOTE — Progress Notes (Signed)
Patient ID: Hunter Reyes, male   DOB: 04-16-1976, 40 y.o.   MRN: [id number]  Subjective: 40 year old male presents the office they for follow-up evaluation of left third toe cellulitis. On the toe is about the same as last appointment he had an MRI done yesterday. December when the tip of the toe but denies any drainage or pus. He has continues to have the lesion on the bottom of his right foot submetatarsal one and also a callus in the ball his foot which are painful.  He denies any systemic complaints as fevers, chills, nausea, vomiting. No calf pain, chest pain, shortness of breath. No other complaints at this time.  Objective: AAO 3, NAD DP/PT pulses palpable 2/4, CRT less than 3 seconds Protective sensation absent with Derrel Nip monofilament, absent vibratory sensation  On the left foot at the distal aspect of the third toe there is an ulceration today which does probe very close to bone although does not expend this time. The wound appears to be about unchanged compared to last appointment. There is no undermining or tunneling. The wound measures approximately 0.3 x 0.3 cm. there is no purulence or drainage expressed today. Erythema appears to to continue and just distal to the PIPJ.  Has not done past the MPJ. There is no ascending cellulitis. On the right foot is hyperkeratotic lesion submetatarsal one and 5. Upon debridement submetatarsal one there is no drainage expressed however there is an ulceration measuring 2.5 x 2.5 cm in a superficial the macerated wound bed. There is no probing, undermining or tunneling. No swelling erythema, ascending saline disc, fluctuation, crepitus, malodor or drainage. Hyperkeratotic lesion right foot submetatarsal 5. No underlying ulceration, drainage or other signs of infection. No other open lesions or pre-ulcerative lesions are identified today. There is no pain with calf compression, swelling, warmth, erythema.   Assessment : 40 year old male with  left third toe cellulitis and right foot deformity with submetatarsal 1 ulceration  deformity to the left foot.   Plan: -Treatment options discussed including all alternatives, risks, and complications -On the right at the lesions were debrided 2 without complications or bleeding. There is no signs of infection at this time to the right side. -Regards to left foot there does continue to be the wound and MRI is showing osteomyelitis. At this time this wound is been ongoing for several months as well as the redness. I discussed with him options for treatment and is tender to proceed with partial toe amputation. We will do this next week. -The incision placement as well as the postoperative course was discussed with the patient. I discussed risks of the surgery which include, but not limited to, infection, bleeding, pain, swelling, need for further surgery, delayed or nonhealing, painful or ugly scar, numbness or sensation changes, over/under correction, recurrence, transfer lesions, further deformity, hardware failure, DVT/PE, loss of toe/foot. Patient understands these risks and wishes to proceed with surgery. The surgical consent was reviewed with the patient all 3 pages were signed. No promises or guarantees were given to the outcome of the procedure. All questions were answered to the best of my ability. Before the surgery the patient was encouraged to call the office if there is any further questions. The surgery will be performed at the [facility] on an outpatient basis. -Continue abx for now.   Celesta Gentile, DPM

## 2016-02-23 ENCOUNTER — Telehealth: Payer: Self-pay | Admitting: *Deleted

## 2016-02-23 NOTE — Telephone Encounter (Signed)
I called and spoke to the patient about his medical history.  He's scheduled for Wednesday.  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  We may not be able to have patient's surgery performed here.  Can you tell me if he had MRSA?"  He was treated for Methicillin Sensitive Staphylococcus Aureus.  "Can you send me a copy of that report?"    I faxed a copy of note from 12/11/2013 to [facility] at [facility].

## 2016-02-24 ENCOUNTER — Encounter (HOSPITAL_BASED_OUTPATIENT_CLINIC_OR_DEPARTMENT_OTHER): Payer: Medicaid Other | Attending: Internal Medicine

## 2016-02-24 DIAGNOSIS — M1990 Unspecified osteoarthritis, unspecified site: Secondary | ICD-10-CM | POA: Diagnosis not present

## 2016-02-24 DIAGNOSIS — L97521 Non-pressure chronic ulcer of other part of left foot limited to breakdown of skin: Secondary | ICD-10-CM | POA: Insufficient documentation

## 2016-02-24 DIAGNOSIS — L03032 Cellulitis of left toe: Secondary | ICD-10-CM | POA: Insufficient documentation

## 2016-02-24 DIAGNOSIS — L97512 Non-pressure chronic ulcer of other part of right foot with fat layer exposed: Secondary | ICD-10-CM | POA: Diagnosis not present

## 2016-02-24 DIAGNOSIS — M86672 Other chronic osteomyelitis, left ankle and foot: Secondary | ICD-10-CM | POA: Insufficient documentation

## 2016-02-24 DIAGNOSIS — G40909 Epilepsy, unspecified, not intractable, without status epilepticus: Secondary | ICD-10-CM | POA: Diagnosis not present

## 2016-02-24 DIAGNOSIS — L84 Corns and callosities: Secondary | ICD-10-CM | POA: Insufficient documentation

## 2016-02-24 DIAGNOSIS — G9009 Other idiopathic peripheral autonomic neuropathy: Secondary | ICD-10-CM | POA: Diagnosis not present

## 2016-02-24 DIAGNOSIS — J449 Chronic obstructive pulmonary disease, unspecified: Secondary | ICD-10-CM | POA: Diagnosis not present

## 2016-02-24 DIAGNOSIS — L97511 Non-pressure chronic ulcer of other part of right foot limited to breakdown of skin: Secondary | ICD-10-CM | POA: Diagnosis not present

## 2016-02-27 NOTE — Telephone Encounter (Signed)
Pt states he is going to have surgery 02/29/2016, but is in pain and was wondering if he could get pain medication to get him to the surgery.

## 2016-02-27 NOTE — Telephone Encounter (Signed)
I will give him a refill Wednesday at surgery.

## 2016-02-28 NOTE — Telephone Encounter (Signed)
I will give it to him tomorrow.

## 2016-02-28 NOTE — Telephone Encounter (Signed)
Dr. Jacqualyn Posey,  I know you read the message concerning his pain medication, but I honestly don't see a response?! Marcy Siren

## 2016-02-29 ENCOUNTER — Encounter: Payer: Self-pay | Admitting: Podiatry

## 2016-02-29 DIAGNOSIS — M86179 Other acute osteomyelitis, unspecified ankle and foot: Secondary | ICD-10-CM | POA: Diagnosis not present

## 2016-03-01 ENCOUNTER — Inpatient Hospital Stay: Admission: RE | Admit: 2016-03-01 | Payer: Medicaid Other | Source: Ambulatory Visit

## 2016-03-02 ENCOUNTER — Telehealth: Payer: Self-pay | Admitting: *Deleted

## 2016-03-02 DIAGNOSIS — L97521 Non-pressure chronic ulcer of other part of left foot limited to breakdown of skin: Secondary | ICD-10-CM | POA: Diagnosis not present

## 2016-03-02 MED ORDER — OXYCODONE-ACETAMINOPHEN 5-325 MG PO TABS: 1.0000 | ORAL_TABLET | ORAL | Status: DC | PRN

## 2016-03-02 NOTE — Progress Notes (Signed)
DOS 02/29/2016 - Left 3rd toe partial amputation.

## 2016-03-02 NOTE — Telephone Encounter (Addendum)
Pt called states has questions about his surgery.  Left message returning pt's call and told pt if I weren't able to get him to call again.  PT CALLED STATES THE PAIN MEDICATION is not covering the pain, would like Dr. Jacqualyn Posey to up the dose.  I asked pt to describe the pain, he described the pain as throbbing and burning is taking the Gabapentin and pain medication.  I instructed pt to remove the cam walker, open-ended sock, and ace wrap, then elevate the foot for 15 minutes, then place foot level and rewrap the ace looser, replace sock and the cam walker. I explained the only exception was if at this time while elevating the foot became more painful could dangle for 15 minutes, then place level and rewrap ace looser.  Pt states understanding.  Dr. Jacqualyn Posey states pt may increase dosing of the Percocet 5/325 to 1 or 2 tablets every 4 hours.  I informed pt and he will send his girlfriend - Alger Memos to pick up today, so he can remain off his feet.  03/05/2016-Pt states his pharmacy won't fill his medication without an authorization code.  Left message instructing pt to call again or if I didn't get in touch with him.  I spoke with Alphonzo Cruise at [facility] and informed her we had changed his dosing to 1-2 tablets Percocet every 4 hour, and it was okay to fill the Percocet written on 03/02/2016.  Informed pt the rx was to be filled.  03/13/2016-Pt states it's been 9 days since he requested pain medication and he would like to pick it up tomorrow, because he is having a lot of throbbing and stinging.  03/14/2016-Left message informing pt Dr.Wagoner refill the Percocet, and he could pick up in the [place] office.  03/21/2016-Pt called for refill of pain medication. 03/23/2016-Pt called for pain medication states toe is throbbing and stabbing pain.  Dr. Berton Lan refill as previous. Informed pt he could pick up the Percocet in the [place] office, pt asked if his girlfriend Ms Nolon Bussing could pick up the rx.  I told him as long as she had her ID.  Pt states he has an appt with Pain Management 04/04/2016.  03/30/2016-Pt would like MRI results.

## 2016-03-07 ENCOUNTER — Encounter: Payer: Self-pay | Admitting: Podiatry

## 2016-03-09 ENCOUNTER — Ambulatory Visit (INDEPENDENT_AMBULATORY_CARE_PROVIDER_SITE_OTHER): Payer: Medicaid Other | Admitting: Podiatry

## 2016-03-09 ENCOUNTER — Ambulatory Visit (INDEPENDENT_AMBULATORY_CARE_PROVIDER_SITE_OTHER): Payer: Medicaid Other

## 2016-03-09 VITALS — Temp 97.7°F

## 2016-03-09 DIAGNOSIS — M86171 Other acute osteomyelitis, right ankle and foot: Secondary | ICD-10-CM

## 2016-03-09 DIAGNOSIS — L84 Corns and callosities: Secondary | ICD-10-CM

## 2016-03-09 DIAGNOSIS — L03032 Cellulitis of left toe: Secondary | ICD-10-CM

## 2016-03-09 DIAGNOSIS — Z9889 Other specified postprocedural states: Secondary | ICD-10-CM

## 2016-03-09 NOTE — Progress Notes (Signed)
Patient ID: Hunter Reyes, male   DOB: 08-29-1976, 40 y.o.   MRN: [id number]  Subjective: Hunter Reyes is a 40 y.o. 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 He said his pain is much improved to the toe.  He has continued the surgical shoe. He is continued antibiotics. Also continues to have calluses and pre-ulcerative lesions to the right foot. Denies any drainage or pus. No swelling or redness. Denies any systemic complaints such as fevers, chills, nausea, vomiting. No calf pain, chest pain, shortness of breath.   Objective: General: No acute distress, AAOx3  DP/PT pulses palpable 2/4, CRT < 3 sec to all digits.  Protective sensation intact. Motor function intact.  Left foot: Incision is well coapted without any evidence of dehiscence. There is no surrounding erythema, ascending cellulitis, fluctuance, crepitus, malodor, drainage/purulence. There is mild edema around the surgical site. There is no pain along the surgical site.  Right foot: Plantar flexion of the first and fifth metatarsals with hyperkeratotic lesion second metatarsal one and 5. Upon debridement there is no underlying ulceration, drainage or other signs of infection today. There is muscle digital deformities present as well. Continue pain on the right foot to metatarsal one and 5. No other areas of tenderness to bilateral lower extremities.  No other open lesions or pre-ulcerative lesions.  No pain with calf compression, swelling, warmth, erythema.   Assessment and Plan:  Status post left partial toe amputation and pre-ulcerative calluses on the right foot, doing well with no complications   -Treatment options discussed including all alternatives, risks, and complications -X-rays were obtained and reviewed with the patient. Status post amputation left 3rd toe.  -Antibiotic ointment was applied followed by dry sterile dressing. Keep the dressing clean, dry, intact.  -Surgical shoe left  foot -Pre-ulcerative calluses his right foot sharply debrided without complications or bleeding. Monitor for any skin breakdown.  -Pain medication as needed. -Monitor for any clinical signs or symptoms of infection and DVT/PE and directed to call the office immediately should any occur or go to the ER. -Follow-up in 1 week for possible suture removal or sooner if any problems arise. In the meantime, encouraged to call the office with any questions, concerns, change in symptoms.   Celesta Gentile, DPM

## 2016-03-11 ENCOUNTER — Other Ambulatory Visit: Payer: Self-pay | Admitting: Podiatry

## 2016-03-12 ENCOUNTER — Inpatient Hospital Stay: Admission: RE | Admit: 2016-03-12 | Payer: Medicaid Other | Source: Ambulatory Visit

## 2016-03-13 NOTE — Telephone Encounter (Signed)
Please refill. I sent this back earlier. I just got the refill notification and I have not received a call.

## 2016-03-14 MED ORDER — OXYCODONE-ACETAMINOPHEN 5-325 MG PO TABS: 1.0000 | ORAL_TABLET | ORAL | Status: DC | PRN

## 2016-03-16 ENCOUNTER — Encounter: Payer: Self-pay | Admitting: Podiatry

## 2016-03-16 DIAGNOSIS — L97521 Non-pressure chronic ulcer of other part of left foot limited to breakdown of skin: Secondary | ICD-10-CM | POA: Diagnosis not present

## 2016-03-19 ENCOUNTER — Encounter (INDEPENDENT_AMBULATORY_CARE_PROVIDER_SITE_OTHER): Payer: Medicaid Other | Admitting: *Deleted

## 2016-03-19 DIAGNOSIS — M86171 Other acute osteomyelitis, right ankle and foot: Secondary | ICD-10-CM

## 2016-03-19 DIAGNOSIS — Z9889 Other specified postprocedural states: Secondary | ICD-10-CM

## 2016-03-20 ENCOUNTER — Inpatient Hospital Stay: Admission: RE | Admit: 2016-03-20 | Payer: Medicaid Other | Source: Ambulatory Visit

## 2016-03-21 ENCOUNTER — Ambulatory Visit (INDEPENDENT_AMBULATORY_CARE_PROVIDER_SITE_OTHER): Payer: Medicaid Other | Admitting: Podiatry

## 2016-03-21 DIAGNOSIS — Z9889 Other specified postprocedural states: Secondary | ICD-10-CM

## 2016-03-21 DIAGNOSIS — L97511 Non-pressure chronic ulcer of other part of right foot limited to breakdown of skin: Secondary | ICD-10-CM

## 2016-03-21 NOTE — Progress Notes (Signed)
Subjective:     Patient ID: Hunter Reyes, male   DOB: 1976-09-23, 40 y.o.   MRN: [id number]  HPI patient states he's healing well   Review of Systems     Objective:   Physical Exam Neurovascular status unchanged with incision site left third digit that's healing well with wound edges well coapted crusted type tissue and no proximal edema erythema or drainage noted. Plantar callus is noted that are under control at the current time    Assessment:     Obese male who is in poor health overall who has had amputation by Dr. Jacqualyn Posey of the third digit left    Plan:     Reviewed condition and at this time sterile removal of stitches accomplished. Sterile dressing reapplied and he will wear this for another week and then can begin getting the foot wet and will see Dr. Jacqualyn Posey in 3 weeks or earlier if any issues should occur he is educated today

## 2016-03-21 NOTE — Telephone Encounter (Signed)
He just had a rx on 03/14/16 for 40 pills.

## 2016-03-23 MED ORDER — OXYCODONE-ACETAMINOPHEN 5-325 MG PO TABS: 1.0000 | ORAL_TABLET | ORAL | Status: DC | PRN

## 2016-03-27 ENCOUNTER — Ambulatory Visit
Admission: RE | Admit: 2016-03-27 | Discharge: 2016-03-27 | Disposition: A | Discharge status: Home / Self Care | Payer: Medicaid Other | Source: Ambulatory Visit | Attending: Podiatry | Admitting: Podiatry

## 2016-03-27 DIAGNOSIS — M86171 Other acute osteomyelitis, right ankle and foot: Secondary | ICD-10-CM

## 2016-03-27 MED ORDER — GADOBENATE DIMEGLUMINE 529 MG/ML IV SOLN: 20.0000 mL | Freq: Once | INTRAVENOUS | Status: DC | PRN

## 2016-04-02 ENCOUNTER — Telehealth: Payer: Self-pay | Admitting: Podiatry

## 2016-04-02 NOTE — Telephone Encounter (Signed)
Late Entry... Called pt around 115pm..Marland KitchenSpoke to pt and he was not available to come in today because his family was at an appt in [place] and would not be back until after 5. I offered him appts in [place] or [place] and pt was to call back to schedule an appt.

## 2016-04-02 NOTE — Telephone Encounter (Signed)
lvm for pt to call to schedule an appt to discuss mri results.

## 2016-04-10 ENCOUNTER — Encounter: Payer: Self-pay | Admitting: Podiatry

## 2016-04-13 ENCOUNTER — Ambulatory Visit (INDEPENDENT_AMBULATORY_CARE_PROVIDER_SITE_OTHER): Payer: Medicaid Other | Admitting: Podiatry

## 2016-04-13 DIAGNOSIS — Z899 Acquired absence of limb, unspecified: Secondary | ICD-10-CM

## 2016-04-13 DIAGNOSIS — L84 Corns and callosities: Secondary | ICD-10-CM

## 2016-04-13 DIAGNOSIS — M216X1 Other acquired deformities of right foot: Secondary | ICD-10-CM | POA: Diagnosis not present

## 2016-04-13 MED ORDER — OXYCODONE-ACETAMINOPHEN 5-325 MG PO TABS: 1.0000 | ORAL_TABLET | ORAL | Status: DC | PRN

## 2016-04-16 ENCOUNTER — Telehealth: Payer: Self-pay | Admitting: *Deleted

## 2016-04-16 NOTE — Telephone Encounter (Addendum)
-----   Message from Trula Slade, DPM sent at 04/13/2016  1:26 PM EDT ----- Can you please send an overread of the MRI he has a wound sub 1 and 4, not sub 2 and I would like a second opinion.  04/16/2016-Left message requesting copy of MRI disc.  04/18/2016-Mailed copy of MRI disc to [facility].  04/19/2016 at 547pm-[facility] states pt's MRI disc copy is blank.  I spoke with Gwinda Passe at [facility] and she states she will have the technicians burn from the MRI machine and stat copy to our office.  2nd copy of MRI disc received and mailed to [place].  05/01/2016-Pt called checking the status of the specialty shoes and inserts and states he was wondering about the rheumatology referral.  I left message informing pt Dr. Jacqualyn Posey had reviewed the MRI overread of his right foot and had ordered Augmentin 875mg  #20 one capsule bid and to follow up next week, and that a letter concerning the specialty shoes had been sent to his insurance, but no response. Pt is scheduled for 05/04/2016 in [place] office.  05/10/2016-Post op courtesy call-I spoke with pt, he states he is doing fine, uses the crutches for balance when walking.  I reminded pt not to be up on the foot more than 15 minutes/hour, remain in the boot, and leave the original dressing in place until 1st POV, and to call with concerns.  Pt states the foot does feel like it is swelling and asked if could loosen the ace. I told him to loosen and rewrap looser, but to continue to wear the ace.  Pt states understanding. 07/19/2016-Left message with [facility] on [place] to call with information concerning pt's dismissal from their practice and if Medicaid does want our office to manage pt's pain medications.

## 2016-04-19 ENCOUNTER — Encounter: Payer: Self-pay | Admitting: Podiatry

## 2016-04-19 NOTE — Progress Notes (Signed)
Patient ID: Hunter Reyes, male   DOB: January 22, 1976, 40 y.o.   MRN: [id number]  Subjective: Hunter Reyes is a 40 y.o. 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 He states he is doing well to the left third toe. He gets some occasional sharp pains with the toe used to be however otherwise she is doing well. He also presents today for follow-up evaluation of the right foot. He states the right foot does become painful at times. He has thick calluses to his right foot but denies any drainage, redness or any increase in swelling. No pus. He denies any acute changes since last appointment. Denies any systemic complaints such as fevers, chills, nausea, vomiting. No calf pain, chest pain, shortness of breath.   Objective: General: No acute distress, AAOx3  DP/PT pulses palpable 2/4, CRT < 3 sec to all digits.  Protective sensation intact. Motor function intact.  Left foot: Incision is well coapted without any evidence of dehiscence. There is no surrounding erythema, ascending cellulitis, fluctuance, crepitus, malodor, drainage/purulence. There is minimal edema around the surgical site. There is no pain along the surgical site.  Right foot: Plantar flexion of the first and fifth metatarsals with hyperkeratotic lesion second metatarsal one and 5. Upon debridement there is no underlying ulceration, drainage or other signs of infection today. There is muscle digital deformities present as well. Continue pain on the right foot to metatarsal one and 5. There is no ulceration or evidence of fluctuance or crepitus submetatarsal 2. No other areas of tenderness to bilateral lower extremities.  No other open lesions or pre-ulcerative lesions.  No pain with calf compression, swelling, warmth, erythema.   Assessment and Plan:  Status post left partial toe amputation and pre-ulcerative calluses on the right foot, doing well with no complications   -Treatment options discussed including all  alternatives, risks, and complications -MRI results were discussed the patient withstood reveal a small fluid collection adjacent the second metatarsal and a wound submetatarsal 2 however that clinically there is no wound in this area. I have some the MRI out for second opinion. -Pre-ulcerative calluses right foot debrided without complications or bleeding. There is no signs of infection today. Continue to monitor. -Given his multiple deformities to his toes and reoccurring ulceration he would benefit from a custom insert/shoe. However given his insurances will likely not be covered. I will be sending a letter to the insurance company requesting coverage the medical necessity. If he does not get the appropriately relief he will likely end up with amputation. Monitor for any clinical signs or symptoms of infection and directed to call the office immediately should any occur or go to the ER. -Follow-up as scheduled.  Celesta Gentile, DPM

## 2016-05-01 MED ORDER — AMOXICILLIN-POT CLAVULANATE 875-125 MG PO TABS: 1.0000 | ORAL_TABLET | Freq: Two times a day (BID) | ORAL | Status: DC

## 2016-05-04 ENCOUNTER — Ambulatory Visit (INDEPENDENT_AMBULATORY_CARE_PROVIDER_SITE_OTHER): Payer: Medicaid Other

## 2016-05-04 ENCOUNTER — Ambulatory Visit (INDEPENDENT_AMBULATORY_CARE_PROVIDER_SITE_OTHER): Payer: Medicaid Other | Admitting: Podiatry

## 2016-05-04 ENCOUNTER — Encounter: Payer: Self-pay | Admitting: Podiatry

## 2016-05-04 DIAGNOSIS — S91301D Unspecified open wound, right foot, subsequent encounter: Secondary | ICD-10-CM | POA: Diagnosis not present

## 2016-05-04 DIAGNOSIS — Z09 Encounter for follow-up examination after completed treatment for conditions other than malignant neoplasm: Secondary | ICD-10-CM

## 2016-05-04 DIAGNOSIS — L02611 Cutaneous abscess of right foot: Secondary | ICD-10-CM

## 2016-05-04 DIAGNOSIS — M86171 Other acute osteomyelitis, right ankle and foot: Secondary | ICD-10-CM

## 2016-05-04 NOTE — Patient Instructions (Signed)
Pre-Operative Instructions  Congratulations, you have decided to take an important step to improving your quality of life.  You can be assured that the doctors of [facility] will be with you every step of the way.  1. Plan to be at the surgery center/hospital at least 1 (one) hour prior to your scheduled time unless otherwise directed by the surgical center/hospital staff.  You must have a responsible adult accompany you, remain during the surgery and drive you home.  Make sure you have directions to the surgical center/hospital and know how to get there on time. 2. For hospital based surgery you will need to obtain a history and physical form from your family physician within 1 month prior to the date of surgery- we will give you a form for you primary physician.  3. We make every effort to accommodate the date you request for surgery.  There are however, times where surgery dates or times have to be moved.  We will contact you as soon as possible if a change in schedule is required.   4. No Aspirin/Ibuprofen for one week before surgery.  If you are on aspirin, any non-steroidal anti-inflammatory medications (Mobic, Aleve, Ibuprofen) you should stop taking it 7 days prior to your surgery.  You make take Tylenol  For pain prior to surgery.  5. Medications- If you are taking daily heart and blood pressure medications, seizure, reflux, allergy, asthma, anxiety, pain or diabetes medications, make sure the surgery center/hospital is aware before the day of surgery so they may notify you which medications to take or avoid the day of surgery. 6. No food or drink after midnight the night before surgery unless directed otherwise by surgical center/hospital staff. 7. No alcoholic beverages 24 hours prior to surgery.  No smoking 24 hours prior to or 24 hours after surgery. 8. Wear loose pants or shorts- loose enough to fit over bandages, boots, and casts. 9. No slip on shoes, sneakers are best. 10. Bring  your boot with you to the surgery center/hospital.  Also bring crutches or a walker if your physician has prescribed it for you.  If you do not have this equipment, it will be provided for you after surgery. 11. If you have not been contracted by the surgery center/hospital by the day before your surgery, call to confirm the date and time of your surgery. 12. Leave-time from work may vary depending on the type of surgery you have.  Appropriate arrangements should be made prior to surgery with your employer. 13. Prescriptions will be provided immediately following surgery by your doctor.  Have these filled as soon as possible after surgery and take the medication as directed. 14. Remove nail polish on the operative foot. 15. Wash the night before surgery.  The night before surgery wash the foot and leg well with the antibacterial soap provided and water paying special attention to beneath the toenails and in between the toes.  Rinse thoroughly with water and dry well with a towel.  Perform this wash unless told not to do so by your physician.  Enclosed: 1 Ice pack (please put in freezer the night before surgery)   1 Hibiclens skin cleaner   Pre-op Instructions  If you have any questions regarding the instructions, do not hesitate to call our office.  [place]: [address] [place], [place] [address] [phone]  [place]: [address] [place], [place] [address] [phone]  [place]: [address]  [place], [place] [address] [phone]   Dr.   Norman Regal DPM, Dr. Matthew Wagoner DPM, Dr. M. Todd Hyatt DPM, Dr. Titorya Stover DPM 

## 2016-05-08 NOTE — Progress Notes (Signed)
Patient ID: Hunter Reyes, male   DOB: 1976/12/13, 40 y.o.   MRN: [id number]  Subjective: Hunter Reyes is a 40 y.o. is seen today for follow-up evaluation of right foot MRI. He states that he is doing well to the right foot is not had any swelling or redness. He does continue painful calluses submetatarsal one and 5. It is a left foot is doing well and is been healed. There is no redness or sores in the left foot. No other complaints at this time. Denies any systemic complaints such as fevers, chills, nausea, vomiting. No calf pain, chest pain, shortness of breath.   Objective: General: No acute distress, AAOx3  DP/PT pulses palpable 2/4, CRT < 3 sec to all digits.  Protective sensation intact. Motor function intact.  Left foot: Incision is well-healed the scar. There is no open lesion identified. No edema, erythema. Right foot: Plantarflexion of the first and fifth metatarsals with hyperkeratotic lesion sub-metatarsal one and 5. There is not appear to be any wound submetatarsal 2. There is mild edema to the foot however there is no erythema or increase in warmth. No drainage or pus. There is multiple digital deformities present as well. Continue pain on the right foot to metatarsal one and 5. No other areas of tenderness to bilateral lower extremities.  No other open lesions or pre-ulcerative lesions.  No pain with calf compression, swelling, warmth, erythema.   Assessment and Plan:  Status post left partial toe amputation and pre-ulcerative calluses on the right foot with possible abscess/ostial myelitis second metatarsal  -Treatment options discussed including all alternatives, risks, and complications -I reviewed the over read of the MRI with the patient. Again it does reveal small abscess and possible osteomyelitis the second metatarsal. Because discussed surgical intervention to evaluate for abscess for an I&D as well as bone biopsies to rule out osteomyelitis. Discussed with him  alternatives and risks and complicaitons he understands this and wishes to proceed The incision placement as well as the postoperative course was discussed with the patient. I discussed risks of the surgery which include, but not limited to, infection, bleeding, pain, swelling, need for further surgery, delayed or nonhealing, painful or ugly scar, numbness or sensation changes, over/under correction, recurrence, transfer lesions, further deformity, hardware failure, DVT/PE, loss of toe/foot. Patient understands these risks and wishes to proceed with surgery. The surgical consent was reviewed with the patient all 3 pages were signed. No promises or guarantees were given to the outcome of the procedure. All questions were answered to the best of my ability. Before the surgery the patient was encouraged to call the office if there is any further questions. The surgery will be performed at the [facility] on an outpatient basis. -Awaiting insert approval from insurance.   Celesta Gentile, DPM

## 2016-05-09 ENCOUNTER — Encounter: Payer: Self-pay | Admitting: Podiatry

## 2016-05-09 DIAGNOSIS — L02611 Cutaneous abscess of right foot: Secondary | ICD-10-CM

## 2016-05-09 DIAGNOSIS — S91301D Unspecified open wound, right foot, subsequent encounter: Secondary | ICD-10-CM

## 2016-05-11 NOTE — Progress Notes (Signed)
DOS 05/09/2016 Right foot incision and drainage, bone biopsy, and wound debridement.

## 2016-05-18 ENCOUNTER — Encounter: Payer: Self-pay | Admitting: Podiatry

## 2016-05-18 ENCOUNTER — Ambulatory Visit (INDEPENDENT_AMBULATORY_CARE_PROVIDER_SITE_OTHER): Payer: Medicaid Other

## 2016-05-18 ENCOUNTER — Ambulatory Visit (INDEPENDENT_AMBULATORY_CARE_PROVIDER_SITE_OTHER): Payer: Medicaid Other | Admitting: Podiatry

## 2016-05-18 VITALS — BP 128/76 | HR 81 | Resp 16

## 2016-05-18 DIAGNOSIS — M869 Osteomyelitis, unspecified: Secondary | ICD-10-CM

## 2016-05-18 DIAGNOSIS — M79671 Pain in right foot: Secondary | ICD-10-CM

## 2016-05-18 DIAGNOSIS — L02611 Cutaneous abscess of right foot: Secondary | ICD-10-CM | POA: Diagnosis not present

## 2016-05-22 ENCOUNTER — Encounter: Payer: Self-pay | Admitting: Podiatry

## 2016-05-22 NOTE — Progress Notes (Addendum)
Patient ID: Hunter Reyes, male   DOB: 1976/06/04, 40 y.o.   MRN: [id number]  Subjective: Hunter Reyes is a 40 y.o. is seen today in office s/p right foot bone biopsy. He states his pain is 7/10 is currently taking pain medicine as prescribed by pain management. He is currently on Percocet. His continue a surgical shoe on the right side. Denies any systemic complaints such as fevers, chills, nausea, vomiting. No calf pain, chest pain, shortness of breath.   Objective: General: No acute distress, AAOx3  DP/PT pulses palpable 2/4, CRT < 3 sec to all digits.  Protective sensation intact. Motor function intact.  Right foot: Incision is well coapted without any evidence of dehiscence and sutures intact. There is no surrounding erythema, ascending cellulitis, fluctuance, crepitus, malodor, drainage/purulence. There is minimal edema around the surgical site. There is mild pain along the surgical site.  Left foot: Amputation site is well-healed. No open lesions. No other areas of tenderness to bilateral lower extremities.  No other open lesions or pre-ulcerative lesions.  No pain with calf compression, swelling, warmth, erythema.   Assessment and Plan:  Status post right foot bone biopsy, doing well with no complications   -Treatment options discussed including all alternatives, risks, and complications -X-rays were obtained and reviewed with the patient. No evidence of acute fracture. Chronic digital deformity is present as well as second and third metatarsal head deformities. -Culture results are negative so far. Awaiting pathology results. -Antibiotic ointment was applied followed by dressing. Keep dressing clean, dry, intact. -Ice/elevation -Pain medication as needed. -Monitor for any clinical signs or symptoms of infection and DVT/PE and directed to call the office immediately should any occur or go to the ER. -Follow-up in 1 week for suture removal or sooner if any problems arise. In  the meantime, encouraged to call the office with any questions, concerns, change in symptoms.   Celesta Gentile, DPM   Culture right foot: NGTD Pathology: pending

## 2016-05-28 ENCOUNTER — Ambulatory Visit (INDEPENDENT_AMBULATORY_CARE_PROVIDER_SITE_OTHER): Payer: Medicaid Other | Admitting: Podiatry

## 2016-05-28 DIAGNOSIS — Z9889 Other specified postprocedural states: Secondary | ICD-10-CM

## 2016-05-28 DIAGNOSIS — M869 Osteomyelitis, unspecified: Secondary | ICD-10-CM

## 2016-06-01 NOTE — Progress Notes (Signed)
Patient ID: LAI VENUS, male   DOB: [birth year], 40 y.o.   MRN: [id number]  Subjective: Hunter Reyes is a 40 y.o. is seen today in office s/p right foot bone biopsy. He presents today for suture removal. He states he is doing better and his pain is improved. Denies any drainage or redness or any swelling to his feet. He is continued surgical shoe. Denies any systemic complaints such as fevers, chills, nausea, vomiting. No calf pain, chest pain, shortness of breath.   Objective: General: No acute distress, AAOx3  DP/PT pulses palpable 2/4, CRT < 3 sec to all digits.  Motor function intact. Right foot: Incision is well coapted without any evidence of dehiscence and sutures intact. There is no surrounding erythema, ascending cellulitis, fluctuance, crepitus, malodor, drainage/purulence. There is improved edema around the surgical site. There is decreased pain along the surgical site. There is chronic visual deformities as well as forefoot prominence on bilateral feet with the right side worse than the left. Left foot: Amputation site is well-healed. No open lesions. No other areas of tenderness to bilateral lower extremities.  No other open lesions or pre-ulcerative lesions.  No pain with calf compression, swelling, warmth, erythema.   Assessment and Plan:  Status post right foot bone biopsy, doing well with no complications   -Treatment options discussed including all alternatives, risks, and complications -Sutures removed without complications or bleeding. -Review cultures and pathology with the patient. -At this time to try custom inserts before proceed with any further surgical intervention for reconstruction of his right foot. A prescription was given to the patient for [employer] for custom inserts. Unfortunately we are unable to do this for him to insurance reasons.  -Monitor for any clinical signs or symptoms of infection and directed to call the office immediately should any occur or  go to the ER. -Follow-up as scheduled or sooner if any problems arise. In the meantime, encouraged to call the office with any questions, concerns, change in symptoms.   Celesta Gentile, DPM

## 2016-06-05 ENCOUNTER — Encounter: Payer: Self-pay | Admitting: Podiatry

## 2016-06-18 ENCOUNTER — Encounter: Payer: Self-pay | Admitting: Podiatry

## 2016-06-18 ENCOUNTER — Ambulatory Visit (INDEPENDENT_AMBULATORY_CARE_PROVIDER_SITE_OTHER): Payer: Medicaid Other

## 2016-06-18 ENCOUNTER — Ambulatory Visit (INDEPENDENT_AMBULATORY_CARE_PROVIDER_SITE_OTHER): Payer: Medicaid Other | Admitting: Podiatry

## 2016-06-18 DIAGNOSIS — M869 Osteomyelitis, unspecified: Secondary | ICD-10-CM

## 2016-06-18 DIAGNOSIS — S92301A Fracture of unspecified metatarsal bone(s), right foot, initial encounter for closed fracture: Secondary | ICD-10-CM | POA: Diagnosis not present

## 2016-06-18 DIAGNOSIS — L84 Corns and callosities: Secondary | ICD-10-CM

## 2016-06-18 DIAGNOSIS — Z9889 Other specified postprocedural states: Secondary | ICD-10-CM

## 2016-06-18 MED ORDER — OXYCODONE-ACETAMINOPHEN 5-325 MG PO TABS: 1.0000 | ORAL_TABLET | ORAL | Status: DC | PRN

## 2016-06-23 NOTE — Progress Notes (Signed)
Patient ID: Hunter Reyes, male   DOB: Jun 12, 1976, 40 y.o.   MRN: [id number]  Subjective: Hunter Reyes is a 40 y.o. is seen today in office s/p right foot bone biopsy and for multiple digital deformities to his feet. He went to [facility] and got an appointment to try to get [employer] for his shoes.  Denies any systemic complaints such as fevers, chills, nausea, vomiting. No calf pain, chest pain, shortness of breath.   Objective: General: No acute distress, AAOx3  DP/PT pulses palpable 2/4, CRT < 3 sec to all digits.  Motor function intact. Multiple digital deformity is present bilaterally with prominent metatarsal heads on the right foot submetatarsal one and 5. There is no edema, erythema to bilateral lower chemise. Amputation says the left foot is well-healed. Hyperkeratotic lesions right foot submetatarsal one and 5. No underlying ulceration, drainage or other signs of infection. Pre-ulcerative lesion left foot submetatarsal 2. No underlying ulceration identified at this time. There is no signs of infection. No other open lesions or pre-ulcerative lesions are identified. No pain with calf compression, swelling, warmth, erythema.   Assessment and Plan: y, Digital contractures with pre-ulcerative lesions.   -Treatment options discussed including all alternatives, risks, and complications -Awaiting custom insert. -Pre-ulcerative callus is debrided 3 without complications or bleeding. -X-rays were obtained and reviewed. This fracture the right second metatarsal neck. He has remained in the surgical shoe and recommended him to continue with this. -Monitor for signs or symptoms of infection to call the office should any occur. -Follow-up as scheduled or sooner if needed.Celesta Gentile, DPM

## 2016-07-09 ENCOUNTER — Ambulatory Visit: Payer: Medicaid Other | Admitting: Podiatry

## 2016-07-17 ENCOUNTER — Other Ambulatory Visit: Payer: Self-pay | Admitting: Podiatry

## 2016-07-17 ENCOUNTER — Encounter: Payer: Self-pay | Admitting: Podiatry

## 2016-07-19 ENCOUNTER — Ambulatory Visit: Payer: Medicaid Other | Admitting: Podiatry

## 2016-07-27 ENCOUNTER — Ambulatory Visit (INDEPENDENT_AMBULATORY_CARE_PROVIDER_SITE_OTHER): Payer: Medicaid Other | Admitting: Podiatry

## 2016-07-27 ENCOUNTER — Encounter: Payer: Self-pay | Admitting: Podiatry

## 2016-07-27 DIAGNOSIS — M216X1 Other acquired deformities of right foot: Secondary | ICD-10-CM

## 2016-07-27 DIAGNOSIS — F1110 Opioid abuse, uncomplicated: Secondary | ICD-10-CM

## 2016-07-27 DIAGNOSIS — M869 Osteomyelitis, unspecified: Secondary | ICD-10-CM

## 2016-07-27 DIAGNOSIS — S92301A Fracture of unspecified metatarsal bone(s), right foot, initial encounter for closed fracture: Secondary | ICD-10-CM | POA: Diagnosis not present

## 2016-07-27 DIAGNOSIS — M79673 Pain in unspecified foot: Secondary | ICD-10-CM

## 2016-07-27 DIAGNOSIS — G8929 Other chronic pain: Secondary | ICD-10-CM

## 2016-07-30 ENCOUNTER — Telehealth: Payer: Self-pay | Admitting: *Deleted

## 2016-07-30 DIAGNOSIS — Z01812 Encounter for preprocedural laboratory examination: Secondary | ICD-10-CM

## 2016-07-30 DIAGNOSIS — R52 Pain, unspecified: Secondary | ICD-10-CM

## 2016-07-30 DIAGNOSIS — S92301A Fracture of unspecified metatarsal bone(s), right foot, initial encounter for closed fracture: Secondary | ICD-10-CM

## 2016-07-30 DIAGNOSIS — L84 Corns and callosities: Secondary | ICD-10-CM

## 2016-07-30 NOTE — Progress Notes (Signed)
Patient ID: Hunter Reyes, male   DOB: May 19, 1976, 40 y.o.   MRN: [id number]  Subjective: Hunter Reyes is a 40 y.o. is seen today for concerns of painful calluses to his right foot as well as for chronic pain to both of his feet. He denies any swelling or redness or any warmth to his feet at this time. Denies any drainage. He also states that he received a letter from [facility] stating that I am the only divided is able to provide pain medication. He is asking for pain medication today. He has no new concerns today other than that heel at the discussed surgery to the right foot to help prevent a painful recurring calluses and hopefully help him walk better.  Denies any systemic complaints such as fevers, chills, nausea, vomiting. No calf pain, chest pain, shortness of breath.   Objective: General: No acute distress, AAOx3  DP/PT pulses palpable 2/4, CRT < 3 sec to all digits.  Motor function intact. Multiple digital deformity is present bilaterally with prominent metatarsal heads on the right foot submetatarsal 1and 5. There is no edema, erythema to bilateral lower extremities. Amputation says the left foot is well-healed. Hyperkeratotic lesions right foot submetatarsal one and 5. No underlying ulceration, drainage or other signs of infection. Pre-ulcerative lesion left foot submetatarsal 2. No underlying ulceration identified at this time. There is no signs of infection. No other open lesions or pre-ulcerative lesions are identified. Multiple digital deformities are present most with the right side worse than left. There is plantar flexion of the first and fifth rays and hammertoe contractures present. There is prominent metatarsal heads plantarly with atrophy of the fat pad. No pain with calf compression, swelling, warmth, erythema.   Assessment and Plan:  Digital contractures with pre-ulcerative lesions; chronic pain   -Treatment options discussed including all alternatives, risks, and  complications -Hyperkeratotic lesions were debrided 4 without, occasions or bleeding. This time there is no underlying ulceration or signs of infection. Continue to monitor. I dispensed a new surgical shoe to help take pressure off the foot on the right side. -He is unable to wear regular shoes I tried given him custom inserts and shoes made however due to insurance reasons unable to do this. At this time noted to further discuss surgical intervention of the right foot. I discussed with him obtain a CT scan for surgical planning to evaluate for osteomyelitis as well as for planning of surgery. I -Worse the chronic pain I reviewed the letter that medication. On the only provided that is able to write pain medication. I discussed with him guidelines for pain medication. I will start by obtaining a urine drug screen before providing any pain medicine. Once this comes back negative I discussed with him Percocet. -Follow-up after CT scan or sooner if any issues are to arise.  Celesta Gentile, DPM

## 2016-07-30 NOTE — Telephone Encounter (Addendum)
Left message informing pt he could go to [facility] for his bloodwork and urine test. Pt states he got my message, but wanted to know if he could go to the [place] lab in [place].  I told him the orders were put in for [facility] and should be at all facilities. Pt states he had his labs done, and asked if Dr. Jacqualyn Posey had ordered his CT SCan. 08/01/2016-Dr. Jacqualyn Posey states Drug screen was negative, CBC with Diff showed high WBC, order Percocet 5/325mg  #30 one tablet every 6 hours prn pain and Augmentin 875mg  #28 one capsule bid. I informed pt of the orders and that Dr. Jacqualyn Posey had also ordered MRI of right foot.08/02/2016-[facility] required clinicals for Case# [id number] given by [facility] [place]., faxed clinicals and Medicaid card copy to [phone]. 08/09/2016-Pt called at 3:56pm and again at 4:11pm requesting pain medication. 08/10/2016-Left message requesting pt call me. Pt called back states he takes one tablet every 6 hours. Pt picked up the oxycodone rx at 3:00pm. 08/15/2016- [facility] [phone] states they are not contracted to authorize pt's Medicaid for procedures, call [phone] for prior authorization information. MEDICAID ([id number] STATES PRIOR AUTHORIZATION IS NOT NEEDED FOR PROCEDURE [place], REFERENCE# [id number]. Faxed to [place] [place]. 09/13/2016-[facility] states CT of right foot should be without contrast, and is scheduled tomorrow at 220pm. Orders changed and given to [place]. [place] for [employer]. 09/14/2016-Pt asked the results of his blood work, states if he is prescribed #60 percocet the pharmacist states he will have to have a pre-cert, states his appt for CT is 09/18/2016 and needs to schedule an appt after.  I told pt I would inform dr. Jacqualyn Posey of the pre-cert for Percocet and transferred pt to schedulers. 09/18/2016-I spoke with [facility] she states they do accept Medicaid, fax referral, demographics and LOV x 3 to [phone].Pt request pain  medication. 09/18/2016-left message informing pt he could pick up the refill of the Percocet in the [place] office. 10/19/2016-Pt states he was only able to get a partial prescription of the Percocet. Dr. Jacqualyn Posey states our office is not for long term pain medication management, refer pt to his primary doctor after refilling for #30. Left message to call for instructions. Left message informing pt to pick up the Percocet rx in the [place] office. 11/02/2016-Left message informing pt our office would not be refilling his pain medication, that he had picked up the pain medication and not spoken to me or called for the instructions I had told him to call back for on 10/19/2016. 11/07/2016-Left message requesting Vaughan Basta have pt contact me at [facility]. Informed pt Dr. Jacqualyn Posey was referring him to [facility], pt states he has been a pt there before and would go again, but he had received a letter from [facility] stating he could only get his narcotics from Dr. Jacqualyn Posey. I told pt I would send in the referral and inform Dr. Jacqualyn Posey of pt's statement. Pt agreed. Faxed referral, demographics and LOV x 3. 02/01/2017-Received letter stating [facility] had been unable to contact pt to schedule appts.

## 2016-07-31 LAB — DRUG ABUSE PANEL 10-50 NO CONF, U
AMPHETAMINES (1000 ng/mL SCRN): NEGATIVE
BARBITURATES: NEGATIVE
BENZODIAZEPINES: NEGATIVE
COCAINE METABOLITES: NEGATIVE
MARIJUANA MET (50 NG/ML SCRN): NEGATIVE
METHADONE: NEGATIVE
METHAQUALONE: NEGATIVE
OPIATES: NEGATIVE
PHENCYCLIDINE: NEGATIVE
PROPOXYPHENE: NEGATIVE

## 2016-07-31 LAB — CBC WITH DIFFERENTIAL/PLATELET
BASOS PCT: 0 %
Basophils Absolute: 0 cells/uL (ref 0–200)
EOS ABS: 531 {cells}/uL — AB (ref 15–500)
Eosinophils Relative: 3 %
HEMATOCRIT: 47.5 % (ref 38.5–50.0)
Hemoglobin: 16.6 g/dL (ref 13.2–17.1)
LYMPHS PCT: 34 %
Lymphs Abs: 6018 cells/uL — ABNORMAL HIGH (ref 850–3900)
MCH: 30.4 pg (ref 27.0–33.0)
MCHC: 34.9 g/dL (ref 32.0–36.0)
MCV: 87 fL (ref 80.0–100.0)
MONOS PCT: 15 %
MPV: 10 fL (ref 7.5–12.5)
Monocytes Absolute: 2655 cells/uL — ABNORMAL HIGH (ref 200–950)
NEUTROS PCT: 48 %
Neutro Abs: 8496 cells/uL — ABNORMAL HIGH (ref 1500–7800)
PLATELETS: 284 10*3/uL (ref 140–400)
RBC: 5.46 MIL/uL (ref 4.20–5.80)
RDW: 14.2 % (ref 11.0–15.0)
WBC: 17.7 10*3/uL — AB (ref 3.8–10.8)

## 2016-07-31 NOTE — Telephone Encounter (Signed)
Please order MRI of the right foot. Chronic ulcerations, rule out osteomyelitis; pre-op

## 2016-08-01 MED ORDER — OXYCODONE-ACETAMINOPHEN 5-325 MG PO TABS: 1.0000 | ORAL_TABLET | Freq: Four times a day (QID) | ORAL | 0 refills | Status: DC | PRN

## 2016-08-01 MED ORDER — AMOXICILLIN-POT CLAVULANATE 875-125 MG PO TABS: 1.0000 | ORAL_TABLET | Freq: Two times a day (BID) | ORAL | 0 refills | Status: DC

## 2016-08-10 MED ORDER — OXYCODONE-ACETAMINOPHEN 5-325 MG PO TABS
1.0000 | ORAL_TABLET | Freq: Three times a day (TID) | ORAL | 0 refills | Status: DC | PRN
Start: 2016-08-10 — End: 2016-08-20

## 2016-08-10 NOTE — Telephone Encounter (Signed)
I need to know exactly how many he is taking at a time and how far apart

## 2016-08-17 ENCOUNTER — Other Ambulatory Visit: Payer: Self-pay | Admitting: Podiatry

## 2016-08-19 ENCOUNTER — Other Ambulatory Visit: Payer: Medicaid Other

## 2016-08-20 ENCOUNTER — Ambulatory Visit (INDEPENDENT_AMBULATORY_CARE_PROVIDER_SITE_OTHER): Payer: Medicaid Other | Admitting: Podiatry

## 2016-08-20 DIAGNOSIS — L84 Corns and callosities: Secondary | ICD-10-CM

## 2016-08-20 DIAGNOSIS — G8929 Other chronic pain: Secondary | ICD-10-CM

## 2016-08-20 DIAGNOSIS — M069 Rheumatoid arthritis, unspecified: Secondary | ICD-10-CM

## 2016-08-20 DIAGNOSIS — M79673 Pain in unspecified foot: Secondary | ICD-10-CM

## 2016-08-20 MED ORDER — AMOXICILLIN-POT CLAVULANATE 875-125 MG PO TABS: 1.0000 | ORAL_TABLET | Freq: Two times a day (BID) | ORAL | 0 refills | Status: DC

## 2016-08-20 MED ORDER — OXYCODONE-ACETAMINOPHEN 5-325 MG PO TABS: 1.0000 | ORAL_TABLET | Freq: Three times a day (TID) | ORAL | 0 refills | Status: DC | PRN

## 2016-08-21 NOTE — Progress Notes (Signed)
Patient ID: Hunter Reyes, male   DOB: 23-Oct-1976, 40 y.o.   MRN: [id number]  Subjective: Hunter Reyes is a 40 y.o. is seen today for concerns of painful calluses to his right foot as well as for chronic pain to both of his feet. He states that he can still feel some puffiness under the callus is asking for refill of Augmentin. He denies any drainage or pus and surrounding redness or red streaks. He states the foot is still painful on both sides is requesting a refill of Percocet is almost out. He states he is tried get back into a pain clinic. Denies any systemic complaints such as fevers, chills, nausea, vomiting. No calf pain, chest pain, shortness of breath.   Objective: General: No acute distress, AAOx3  DP/PT pulses palpable 2/4, CRT < 3 sec to all digits.  Motor function intact. Multiple digital deformity is present bilaterally with prominent metatarsal heads on the right foot submetatarsal 1and 5. There is no edema, erythema to bilateral lower extremities. Amputation says the left foot is well-healed. Hyperkeratotic lesions right foot submetatarsal 1and 5. No underlying ulceration, drainage or other signs of infection. Pre-ulcerative lesion left foot submetatarsal 2. No underlying ulceration identified at this time. There is no signs of infection. No other open lesions or pre-ulcerative lesions are identified. Multiple digital deformities are present most with the right side worse than left. There is plantar flexion of the first and fifth rays and hammertoe contractures present. There is prominent metatarsal heads plantarly with atrophy of the fat pad. No pain with calf compression, swelling, warmth, erythema.   Assessment and Plan:  Digital contractures with pre-ulcerative lesions; chronic pain   -Treatment options discussed including all alternatives, risks, and complications -Hyperkeratotic lesions were debrided 4 without, occasions or bleeding. This time there is no underlying  ulceration or signs of infection. Continue to monitor.  -Awaiting CT scan. - Refilled Augmentin today. -Refilled Percocet. -Order blood work today including ESR, CRP, CBC, rheumatoid factor, ANA. -Follow-up after CT scan or sooner if any issues are to arise.  Celesta Gentile, DPM

## 2016-08-30 ENCOUNTER — Encounter: Payer: Self-pay | Admitting: Podiatry

## 2016-08-30 ENCOUNTER — Telehealth: Payer: Self-pay | Admitting: Podiatry

## 2016-08-30 LAB — CBC WITH DIFFERENTIAL/PLATELET
Basophils Absolute: 0 cells/uL (ref 0–200)
Basophils Relative: 0 %
EOS PCT: 3 %
Eosinophils Absolute: 372 cells/uL (ref 15–500)
HCT: 47.2 % (ref 38.5–50.0)
HEMOGLOBIN: 16.4 g/dL (ref 13.2–17.1)
LYMPHS ABS: 3348 {cells}/uL (ref 850–3900)
Lymphocytes Relative: 27 %
MCH: 30.9 pg (ref 27.0–33.0)
MCHC: 34.7 g/dL (ref 32.0–36.0)
MCV: 88.9 fL (ref 80.0–100.0)
MONOS PCT: 9 %
MPV: 9.9 fL (ref 7.5–12.5)
Monocytes Absolute: 1116 cells/uL — ABNORMAL HIGH (ref 200–950)
NEUTROS ABS: 7564 {cells}/uL (ref 1500–7800)
Neutrophils Relative %: 61 %
PLATELETS: 278 10*3/uL (ref 140–400)
RBC: 5.31 MIL/uL (ref 4.20–5.80)
RDW: 13.9 % (ref 11.0–15.0)
WBC: 12.4 10*3/uL — AB (ref 3.8–10.8)

## 2016-08-30 LAB — SEDIMENTATION RATE: Sed Rate: 1 mm/hr (ref 0–15)

## 2016-08-30 LAB — HEMOGLOBIN A1C
Hgb A1c MFr Bld: 5 % (ref <5.7)
Mean Plasma Glucose: 97 mg/dL

## 2016-08-30 LAB — RHEUMATOID FACTOR

## 2016-08-30 LAB — C-REACTIVE PROTEIN

## 2016-08-30 LAB — ANA: ANA: NEGATIVE

## 2016-08-30 NOTE — Telephone Encounter (Signed)
Mia from [facility] called and they called pt to schedule his MRI from orders and pt states it was to be a CT scan. Can we change orders in the system to a Ct scan please

## 2016-08-31 ENCOUNTER — Telehealth: Payer: Self-pay | Admitting: *Deleted

## 2016-08-31 ENCOUNTER — Telehealth: Payer: Self-pay | Admitting: Podiatry

## 2016-08-31 NOTE — Telephone Encounter (Signed)
Hunter Reyes called and requested a refill on his pain medications.

## 2016-08-31 NOTE — Telephone Encounter (Signed)
I need you to give me a call regarding [facility].  Thank you and have a good day."  I'm returning your call.  How can I help you?  "I think Darrick Penna is taking care of it.  I spoke to her earlier.  She was sending a message to Dr. Jacqualyn Posey.  I'm out of pain medication."  I will send him a message.  Will call with a response.  "Alright thanks."

## 2016-09-02 NOTE — Telephone Encounter (Signed)
OK 

## 2016-09-02 NOTE — Telephone Encounter (Signed)
Yes, this is to be a CT scan not an MRI. Please change the order.

## 2016-09-03 ENCOUNTER — Other Ambulatory Visit: Payer: Self-pay

## 2016-09-03 MED ORDER — OXYCODONE-ACETAMINOPHEN 5-325 MG PO TABS: 1.0000 | ORAL_TABLET | Freq: Three times a day (TID) | ORAL | 0 refills | Status: DC | PRN

## 2016-09-03 NOTE — Progress Notes (Signed)
LVM for pt to pick up pain med Rx, and to log times that er takes the medication for pain management purposes

## 2016-09-05 ENCOUNTER — Other Ambulatory Visit: Payer: Self-pay

## 2016-09-05 DIAGNOSIS — M79673 Pain in unspecified foot: Secondary | ICD-10-CM

## 2016-09-05 DIAGNOSIS — M869 Osteomyelitis, unspecified: Secondary | ICD-10-CM

## 2016-09-05 DIAGNOSIS — G8929 Other chronic pain: Secondary | ICD-10-CM

## 2016-09-05 DIAGNOSIS — M86171 Other acute osteomyelitis, right ankle and foot: Secondary | ICD-10-CM

## 2016-09-05 DIAGNOSIS — L97511 Non-pressure chronic ulcer of other part of right foot limited to breakdown of skin: Secondary | ICD-10-CM

## 2016-09-05 DIAGNOSIS — M069 Rheumatoid arthritis, unspecified: Secondary | ICD-10-CM

## 2016-09-05 NOTE — Telephone Encounter (Signed)
Hunter Reyes took care of patient.

## 2016-09-13 ENCOUNTER — Telehealth: Payer: Self-pay

## 2016-09-13 NOTE — Telephone Encounter (Signed)
LVM informing pt of lab results, he is to call with any further questions or concerns

## 2016-09-14 ENCOUNTER — Other Ambulatory Visit: Payer: Medicaid Other

## 2016-09-14 ENCOUNTER — Inpatient Hospital Stay: Admission: RE | Admit: 2016-09-14 | Payer: Medicaid Other | Source: Ambulatory Visit

## 2016-09-18 ENCOUNTER — Ambulatory Visit
Admission: RE | Admit: 2016-09-18 | Discharge: 2016-09-18 | Disposition: A | Discharge status: Home / Self Care | Payer: Medicaid Other | Source: Ambulatory Visit | Attending: Podiatry | Admitting: Podiatry

## 2016-09-18 ENCOUNTER — Inpatient Hospital Stay: Admission: RE | Admit: 2016-09-18 | Payer: Medicaid Other | Source: Ambulatory Visit

## 2016-09-18 DIAGNOSIS — S92301A Fracture of unspecified metatarsal bone(s), right foot, initial encounter for closed fracture: Secondary | ICD-10-CM

## 2016-09-18 DIAGNOSIS — L84 Corns and callosities: Secondary | ICD-10-CM

## 2016-09-18 MED ORDER — OXYCODONE-ACETAMINOPHEN 5-325 MG PO TABS: 1.0000 | ORAL_TABLET | Freq: Three times a day (TID) | ORAL | 0 refills | Status: DC | PRN

## 2016-09-18 NOTE — Telephone Encounter (Signed)
OK to refill

## 2016-09-21 ENCOUNTER — Ambulatory Visit: Payer: Medicaid Other | Admitting: Podiatry

## 2016-09-24 ENCOUNTER — Ambulatory Visit: Payer: Medicaid Other | Admitting: Podiatry

## 2016-09-28 ENCOUNTER — Ambulatory Visit: Payer: Medicaid Other | Admitting: Podiatry

## 2016-10-05 ENCOUNTER — Encounter: Payer: Self-pay | Admitting: Podiatry

## 2016-10-05 ENCOUNTER — Ambulatory Visit (INDEPENDENT_AMBULATORY_CARE_PROVIDER_SITE_OTHER): Payer: Medicaid Other | Admitting: Podiatry

## 2016-10-05 DIAGNOSIS — G629 Polyneuropathy, unspecified: Secondary | ICD-10-CM

## 2016-10-05 DIAGNOSIS — L84 Corns and callosities: Secondary | ICD-10-CM

## 2016-10-05 DIAGNOSIS — M79673 Pain in unspecified foot: Secondary | ICD-10-CM

## 2016-10-05 DIAGNOSIS — Q828 Other specified congenital malformations of skin: Secondary | ICD-10-CM

## 2016-10-05 DIAGNOSIS — G8929 Other chronic pain: Secondary | ICD-10-CM

## 2016-10-05 DIAGNOSIS — D72829 Elevated white blood cell count, unspecified: Secondary | ICD-10-CM

## 2016-10-05 LAB — CBC WITH DIFFERENTIAL/PLATELET
BASOS ABS: 0 {cells}/uL (ref 0–200)
Basophils Relative: 0 %
EOS ABS: 474 {cells}/uL (ref 15–500)
Eosinophils Relative: 3 %
HCT: 49.9 % (ref 38.5–50.0)
Hemoglobin: 17.1 g/dL (ref 13.2–17.1)
LYMPHS PCT: 27 %
Lymphs Abs: 4266 cells/uL — ABNORMAL HIGH (ref 850–3900)
MCH: 30.6 pg (ref 27.0–33.0)
MCHC: 34.3 g/dL (ref 32.0–36.0)
MCV: 89.3 fL (ref 80.0–100.0)
MONOS PCT: 16 %
MPV: 9.8 fL (ref 7.5–12.5)
Monocytes Absolute: 2528 cells/uL — ABNORMAL HIGH (ref 200–950)
NEUTROS PCT: 54 %
Neutro Abs: 8532 cells/uL — ABNORMAL HIGH (ref 1500–7800)
PLATELETS: 275 10*3/uL (ref 140–400)
RBC: 5.59 MIL/uL (ref 4.20–5.80)
RDW: 13.6 % (ref 11.0–15.0)
WBC: 15.8 10*3/uL — ABNORMAL HIGH (ref 3.8–10.8)

## 2016-10-05 MED ORDER — OXYCODONE-ACETAMINOPHEN 5-325 MG PO TABS: 1.0000 | ORAL_TABLET | Freq: Three times a day (TID) | ORAL | 0 refills | Status: DC | PRN

## 2016-10-09 ENCOUNTER — Telehealth: Payer: Self-pay | Admitting: *Deleted

## 2016-10-09 IMAGING — MR MR FOOT*R* WO/W CM
6 of 9 series · 27 of 40 positions shown · IV contrast (20 ml multihance)
Comparison: None.

CLINICAL DATA: Right foot pain. Open wound on the bottom of the
foot. No prior surgery. No history diabetes.

EXAM:
MRI OF THE RIGHT FOREFOOT WITHOUT AND WITH CONTRAST
TECHNIQUE: Multiplanar, multisequence MR imaging was performed both before and
after administration of intravenous contrast.
CONTRAST:  20 mL MultiHance

[Series 5: T1 fat-sat · coronal · right · 4.0mm · 0.31mm/px · 6 of 34 slices shown (1 of 3)]
[im 1/34]
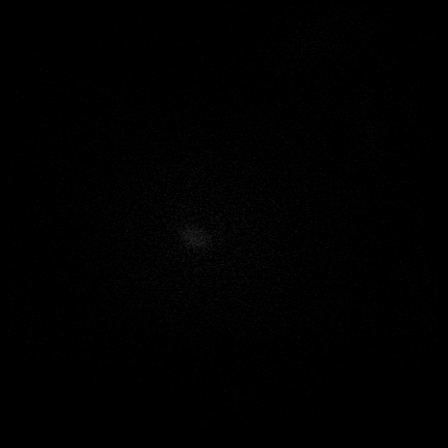
[im 7/34]
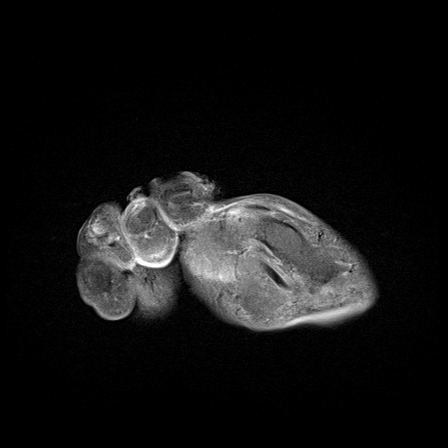
[im 14/34]
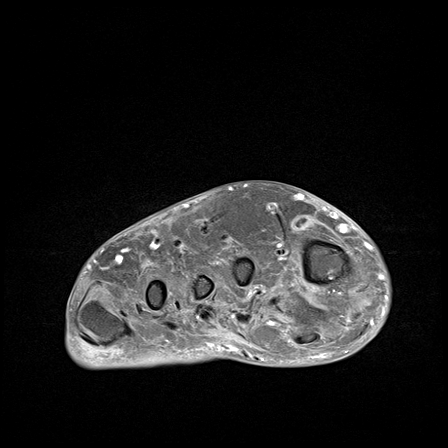
[im 20/34]
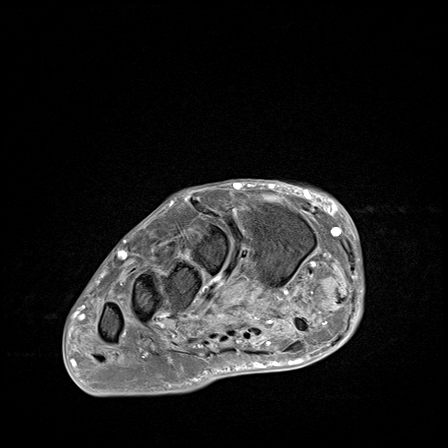
[im 27/34]
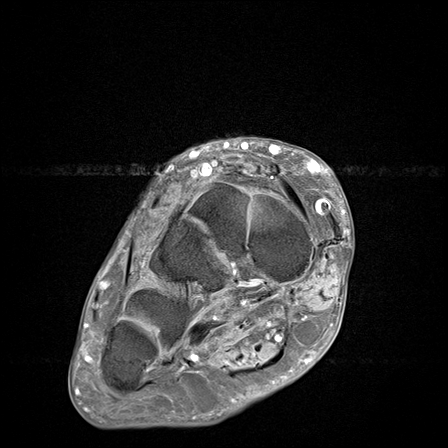
[im 34/34]
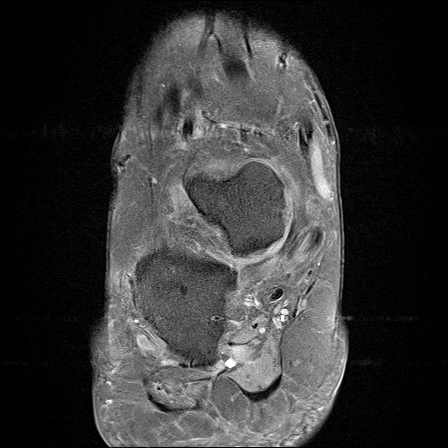

[Series 6: T2 fat-sat · coronal · right · 4.0mm · 0.39mm/px · 6 of 34 slices shown]
[im 1/34]
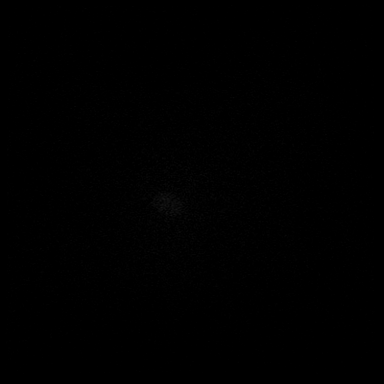
[im 7/34]
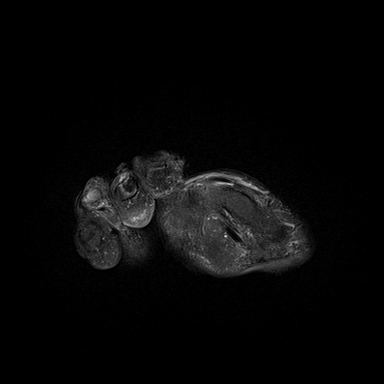
[im 14/34]
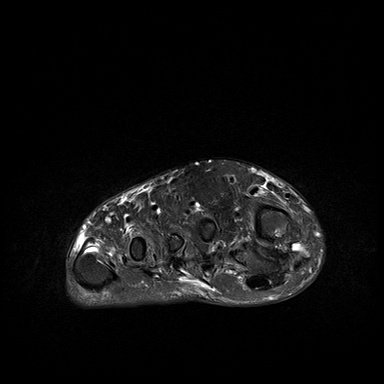
[im 20/34]
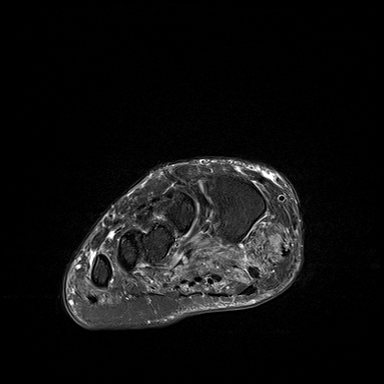
[im 27/34]
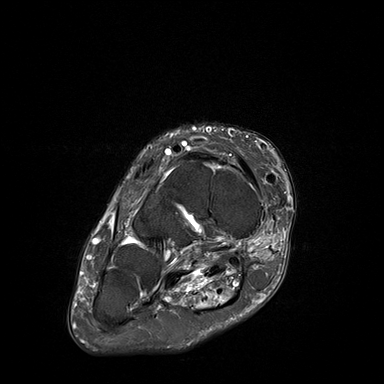
[im 34/34]
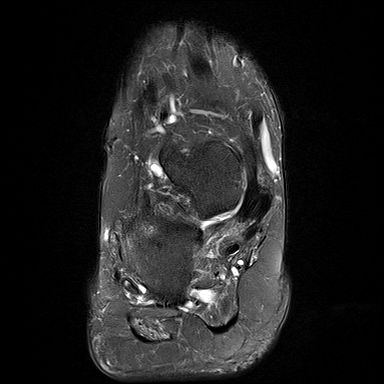

[Series 7: T1 · axial · right · 3.0mm · 0.40mm/px · z∈[-152,-86]mm · 3 of 20 slices shown]
[im 1/20]
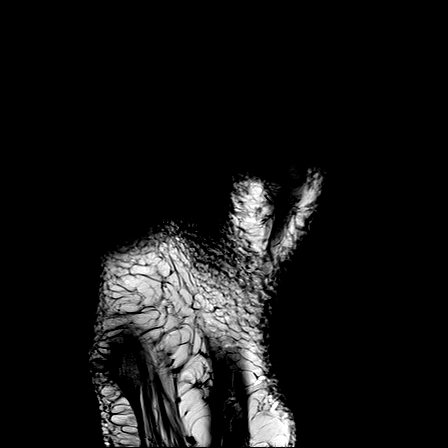
[im 10/20]
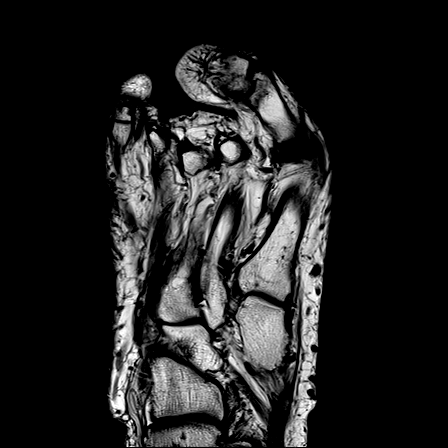
[im 20/20]
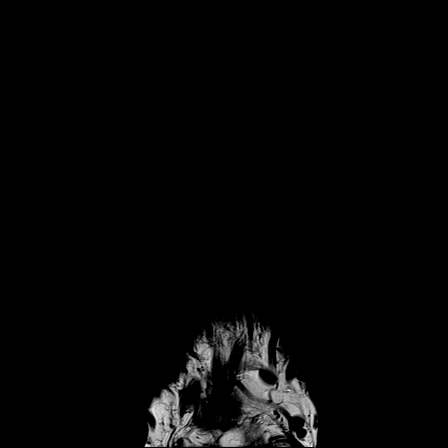

[Series 10: T1 fat-sat · coronal · right · 4.0mm · 0.31mm/px · 6 of 34 slices shown (2 of 3)]
[im 1/34]
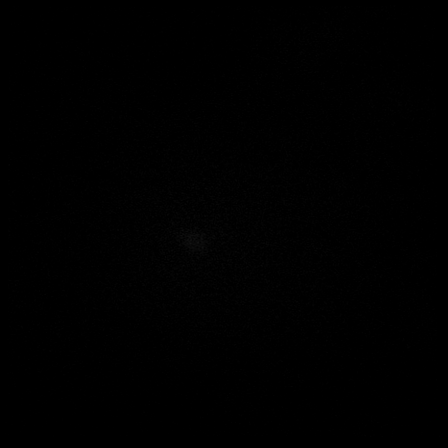
[im 7/34]
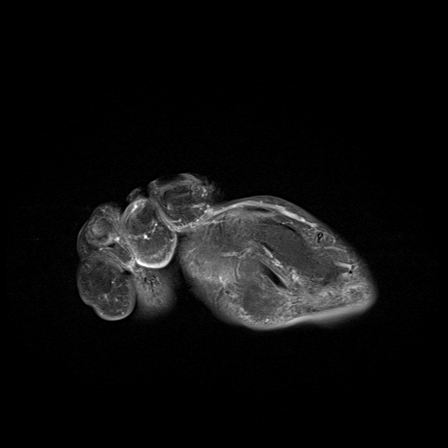
[im 14/34]
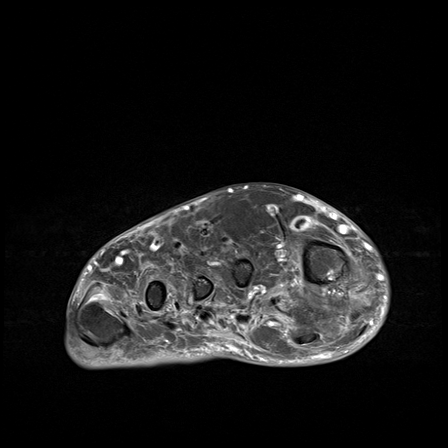
[im 20/34]
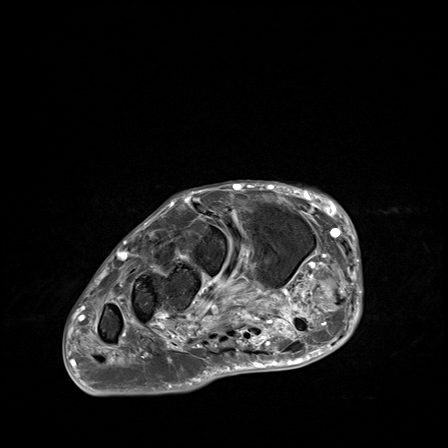
[im 27/34]
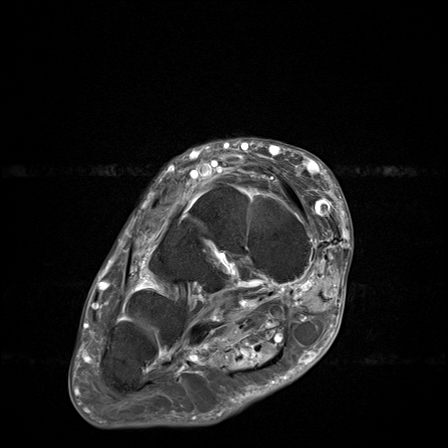
[im 34/34]
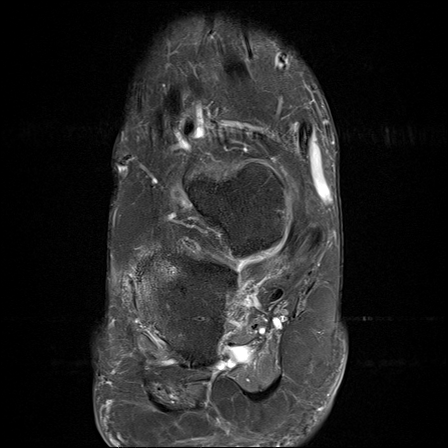

[Series 11: T1 fat-sat · axial · right · 3.0mm · 0.40mm/px · z∈[-152,-86]mm · 3 of 20 slices shown (3 of 3)]
[im 1/20]
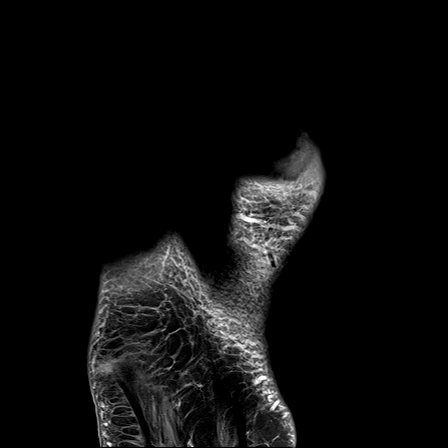
[im 10/20]
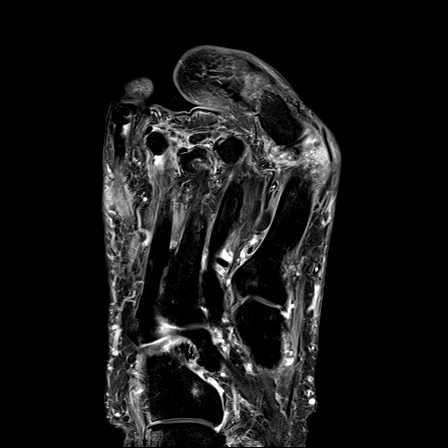
[im 20/20]
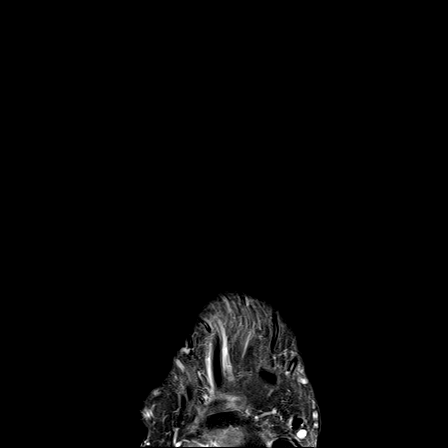

[Series 12: T1 fat-sat post-contrast · sagittal · right · 3.0mm · 0.40mm/px · 3 of 30 slices shown]
[im 1/30]
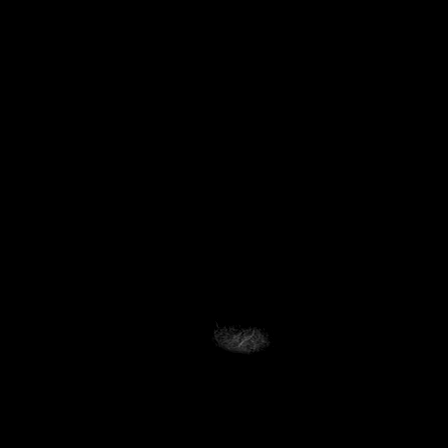
[im 8/30]
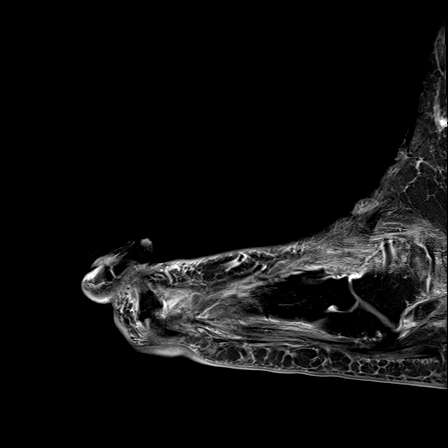
[im 15/30]
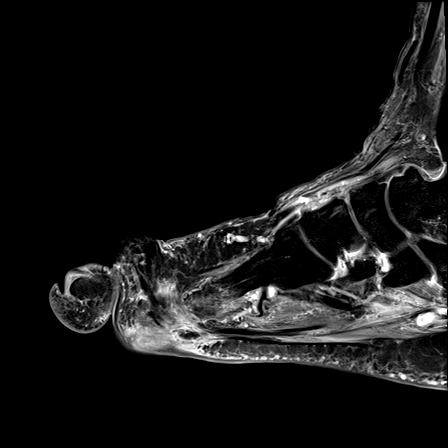

[27 of 40 positions shown; findings below may reference images not displayed]

FINDINGS: Severe hallux valgus of the right foot with moderate osteoarthritis
of the first MTP joint. No acute fracture or dislocation.

There is no periosteal reaction or bone destruction. There are
moderate degenerative changes of the second MTP joint. There are
mild degenerative changes of the third MTP joint. There is mild
reactive marrow edema in the medial aspect of the first metatarsal
head which may be secondary to underlying osteoarthritis. There are
soft tissue skin thickening along the plantar aspect of the first
metatarsal head and fourth metatarsal heads. There is a soft tissue
skin ulcer along the plantar aspect of the second metatarsal head
extending to the cortex of the second metatarsal neck without
cortical destruction. 4 mm fluid collection in the subcutaneous fat
adjacent to the second metatarsal skin ulcer likely representing a
small abscess.

There is no other marrow signal abnormality.

There is no focal fluid collection or hematoma. Muscles are normal
in signal. Visualized flexor, extensor and peroneal tendons are
intact.
IMPRESSION: 1. No evidence of osteomyelitis of the right foot.
2. Soft tissue skin ulcer along the plantar aspect of the second
metatarsal head extending to the cortex of the second metatarsal
neck without adjacent marrow signal abnormality. 4 mm fluid
collection in the subcutaneous fat adjacent to the second metatarsal
skin ulcer likely representing a small abscess.

## 2016-10-09 NOTE — Telephone Encounter (Addendum)
-----   Message from Trula Slade, DPM sent at 10/07/2016  6:54 PM EDT ----- His WBC is going back up. His feet do not look infected. Please have him follow-up with his primary care doctor and please fax the labs to them. Thank you. 10/09/2016-Left message informing Hunter Reyes I was trying to get in touch with Hunter Reyes. Left message stating I was trying to get in touch with pt and left call back number. Pt returned my call and I informed him of Dr. Leigh Aurora recommendation that he be followed by his PCP for the elevated WBC, and that the labs were in [facility] and Dr. Legrand Rams was a [facility] physician, she would be able to review. Pt states understanding.

## 2016-10-11 NOTE — Progress Notes (Signed)
Patient ID: Hunter Reyes, male   DOB: 10-Jan-1976, 40 y.o.   MRN: [id number]  Subjective: Hunter Reyes is a 40 y.o. is seen today for concerns of painful calluses to his right foot as well as for chronic pain to both of his feet. He denies any recent redness or increase in warmth or swelling to his feet. The calluses will become painful knees pain to both his feet with walking. He was unable to get custom inserts. He is requesting a refill of his pain medicine. Denies any systemic complaints such as fevers, chills, nausea, vomiting. No calf pain, chest pain, shortness of breath.   Objective: General: No acute distress, AAOx3  DP/PT pulses palpable 2/4, CRT < 3 sec to all digits.  Motor function intact. Multiple digital deformity is present bilaterally with prominent metatarsal heads on the right foot submetatarsal 1and 5. There is no edema, erythema to bilateral lower extremities. Amputation says the left foot is well-healed. Hyperkeratotic lesions right foot submetatarsal 1and 5. No underlying ulceration, drainage or other signs of infection. Pre-ulcerative lesion left foot submetatarsal 2. No underlying ulceration identified at this time. There is no signs of infection. No other open lesions or pre-ulcerative lesions are identified. Multiple digital deformities are present most with the right side worse than left. There is plantar flexion of the first and fifth rays and hammertoe contractures present. There is prominent metatarsal heads plantarly with atrophy of the fat pad. No pain with calf compression, swelling, warmth, erythema.   Assessment and Plan:  Digital contractures with pre-ulcerative lesions; chronic pain   -Treatment options discussed including all alternatives, risks, and complications -Hyperkeratotic lesions were debrided 4 without complications or bleeding. This time there is no underlying ulceration or signs of infection. Continue to monitor.  -CT scan results were  discussed the patient. -Will recheck CBC as it has been elevated. I do not believe the infection of the cause an increased white blood cells coming from the foot. This is the best the foot is looking fine started seeing him. -Refilled Percocet. He is still trying to get into a pain clinic.  -Dispensed a new [employer] shoe at his request. -Follow-up in 6 weeks or sooner if needed. Call any questions concerns the meantime.  Celesta Gentile, DPM

## 2016-10-19 MED ORDER — OXYCODONE-ACETAMINOPHEN 5-325 MG PO TABS
1.0000 | ORAL_TABLET | Freq: Three times a day (TID) | ORAL | 0 refills | Status: DC | PRN
Start: 2016-10-19 — End: 2017-06-27

## 2016-10-19 MED ORDER — OXYCODONE-ACETAMINOPHEN 5-325 MG PO TABS: 1.0000 | ORAL_TABLET | Freq: Three times a day (TID) | ORAL | 0 refills | Status: DC | PRN

## 2016-11-02 ENCOUNTER — Telehealth: Payer: Self-pay | Admitting: Podiatry

## 2016-11-02 NOTE — Telephone Encounter (Addendum)
I informed pt that 10/19/2016 Dr. Jacqualyn Posey stated that he would no longer refill the Percocet, that our office was not for long term pain medication management and that he should get his pain medication from his PCP. Pt states understanding. 11/06/2016-Left message with pt's sister to let us know if he had gotten an appt with his PCP.

## 2016-11-02 NOTE — Telephone Encounter (Signed)
Pt states he missed a couple of calls from this morning. Pt was thinking maybe rx was ready for pick up and I looked did not see anything.

## 2016-11-05 NOTE — Telephone Encounter (Signed)
Please make sure that he can get in to see his PCP and that there is not an issue with Medicaid

## 2016-11-06 NOTE — Telephone Encounter (Signed)
Have we tried a different pain management physician? Have you put in a referral to [facility] for this?

## 2016-11-08 NOTE — Telephone Encounter (Signed)
That is what I thought about the Medicaid letter. Once he gets back into pain management, hopefully it can be transferred to them.

## 2016-11-19 ENCOUNTER — Ambulatory Visit: Payer: Medicaid Other | Admitting: Podiatry

## 2016-11-29 ENCOUNTER — Ambulatory Visit: Payer: Medicaid Other | Admitting: Podiatry

## 2017-03-04 ENCOUNTER — Ambulatory Visit: Payer: Medicaid Other | Admitting: Podiatry

## 2017-03-13 ENCOUNTER — Ambulatory Visit: Payer: Medicaid Other | Admitting: Podiatry

## 2017-04-12 ENCOUNTER — Ambulatory Visit: Payer: Medicaid Other | Admitting: Podiatry

## 2017-04-25 ENCOUNTER — Ambulatory Visit: Payer: Medicaid Other | Admitting: Podiatry

## 2017-06-13 ENCOUNTER — Ambulatory Visit (INDEPENDENT_AMBULATORY_CARE_PROVIDER_SITE_OTHER): Payer: Medicaid Other | Admitting: Podiatry

## 2017-06-13 ENCOUNTER — Encounter: Payer: Self-pay | Admitting: Podiatry

## 2017-06-13 DIAGNOSIS — G629 Polyneuropathy, unspecified: Secondary | ICD-10-CM

## 2017-06-13 DIAGNOSIS — L84 Corns and callosities: Secondary | ICD-10-CM

## 2017-06-16 NOTE — Progress Notes (Signed)
Patient ID: Hunter Reyes, male   DOB: 1976-01-24, 41 y.o.   MRN: [id number]  Subjective: Hunter Reyes is a 41 y.o. is seen today for concerns of painful calluses to his right foot. Unfortunately I have not sen him in several months due to personal issues. He states that the calluses are still painful but denies any open sores. No drainage or pus. No redness or red streaks. Denies any systemic complaints such as fevers, chills, nausea, vomiting. No calf pain, chest pain, shortness of breath.   Objective: General: No acute distress, AAOx3  DP/PT pulses palpable 2/4, CRT < 3 sec to all digits.  Motor function intact. Multiple digital deformity is present bilaterally with prominent metatarsal heads on the right foot submetatarsal 1and 5. There is no edema, erythema to bilateral lower extremities. Amputation says the left foot is well-healed.Multiple digital deformities are present most with the right side worse than left. There is plantarflexion of the first and fifth rays and hammertoe contractures present. There is prominent metatarsal heads plantarly with atrophy of the fat pad. Hyperkeratotic lesions right foot submetatarsal 1and 5. No underlying ulceration, drainage or other signs of infection. Pre-ulcerative lesion left foot submetatarsal 2. No underlying ulceration identified at this time. There is no signs of infection. No other open lesions or pre-ulcerative lesions are identified. Overall, the lesions are doing well and there are no signs of infection.  No pain with calf compression, swelling, warmth, erythema.   Assessment and Plan:  Digital contractures with pre-ulcerative lesions   -Treatment options discussed including all alternatives, risks, and complications -Hyperkeratotic lesions were debrided 3 without complications or bleeding. This time there is no underlying ulceration or signs of infection. Continue to monitor.  -Continue with supportive shoes and offloading.   -Monitor for any clinical signs or symptoms of infection and directed to call the office immediately should any occur or go to the ER. -RTC as scheduled or sooner if needed.   Hunter Reyes, DPM

## 2017-06-27 ENCOUNTER — Encounter (HOSPITAL_COMMUNITY): Payer: Self-pay

## 2017-06-27 ENCOUNTER — Emergency Department (HOSPITAL_COMMUNITY): Payer: Medicaid Other

## 2017-06-27 ENCOUNTER — Emergency Department (HOSPITAL_COMMUNITY)
Admission: EM | Admit: 2017-06-27 | Discharge: 2017-06-27 | Disposition: A | Discharge status: Home / Self Care | Payer: Medicaid Other | Attending: Emergency Medicine | Admitting: Emergency Medicine

## 2017-06-27 DIAGNOSIS — Z79899 Other long term (current) drug therapy: Secondary | ICD-10-CM | POA: Insufficient documentation

## 2017-06-27 DIAGNOSIS — N611 Abscess of the breast and nipple: Secondary | ICD-10-CM | POA: Diagnosis not present

## 2017-06-27 DIAGNOSIS — F17210 Nicotine dependence, cigarettes, uncomplicated: Secondary | ICD-10-CM | POA: Diagnosis not present

## 2017-06-27 DIAGNOSIS — N644 Mastodynia: Secondary | ICD-10-CM | POA: Diagnosis present

## 2017-06-27 DIAGNOSIS — J449 Chronic obstructive pulmonary disease, unspecified: Secondary | ICD-10-CM | POA: Insufficient documentation

## 2017-06-27 LAB — COMPREHENSIVE METABOLIC PANEL
ALBUMIN: 3.6 g/dL (ref 3.5–5.0)
ALT: 21 U/L (ref 17–63)
ANION GAP: 9 (ref 5–15)
AST: 22 U/L (ref 15–41)
Alkaline Phosphatase: 71 U/L (ref 38–126)
BUN: 7 mg/dL (ref 6–20)
CHLORIDE: 106 mmol/L (ref 101–111)
CO2: 21 mmol/L — ABNORMAL LOW (ref 22–32)
Calcium: 8.5 mg/dL — ABNORMAL LOW (ref 8.9–10.3)
Creatinine, Ser: 0.77 mg/dL (ref 0.61–1.24)
GFR calc Af Amer: >60 mL/min (ref >60)
GFR calc non Af Amer: >60 mL/min (ref >60)
GLUCOSE: 82 mg/dL (ref 65–99)
POTASSIUM: 3.8 mmol/L (ref 3.5–5.1)
SODIUM: 136 mmol/L (ref 135–145)
Total Bilirubin: 0.5 mg/dL (ref 0.3–1.2)
Total Protein: 6.8 g/dL (ref 6.5–8.1)

## 2017-06-27 LAB — CBC WITH DIFFERENTIAL/PLATELET
BASOS ABS: 0.1 10*3/uL (ref 0.0–0.1)
Basophils Relative: 1 %
Eosinophils Absolute: 0.4 10*3/uL (ref 0.0–0.7)
Eosinophils Relative: 3 %
HEMATOCRIT: 43.3 % (ref 39.0–52.0)
Hemoglobin: 15 g/dL (ref 13.0–17.0)
LYMPHS ABS: 3.6 10*3/uL (ref 0.7–4.0)
LYMPHS PCT: 28 %
MCH: 30.1 pg (ref 26.0–34.0)
MCHC: 34.6 g/dL (ref 30.0–36.0)
MCV: 86.9 fL (ref 78.0–100.0)
MONO ABS: 2.3 10*3/uL — AB (ref 0.1–1.0)
MONOS PCT: 18 %
NEUTROS ABS: 6.6 10*3/uL (ref 1.7–7.7)
Neutrophils Relative %: 50 %
Platelets: 233 10*3/uL (ref 150–400)
RBC: 4.98 MIL/uL (ref 4.22–5.81)
RDW: 14.4 % (ref 11.5–15.5)
WBC: 12.9 10*3/uL — ABNORMAL HIGH (ref 4.0–10.5)

## 2017-06-27 LAB — LACTIC ACID, PLASMA: Lactic Acid, Venous: 1.3 mmol/L (ref 0.5–1.9)

## 2017-06-27 MED ORDER — HYDROCODONE-ACETAMINOPHEN 5-325 MG PO TABS: 1.0000 | ORAL_TABLET | ORAL | 0 refills | Status: DC | PRN

## 2017-06-27 MED ORDER — DOXYCYCLINE HYCLATE 100 MG PO TABS
100.0000 mg | ORAL_TABLET | Freq: Once | ORAL | Status: AC
  Administered 2017-06-27: 100 mg via ORAL
  Filled 2017-06-27: qty 1

## 2017-06-27 MED ORDER — DEXTROSE 5 % IV SOLN
1.0000 g | Freq: Once | INTRAVENOUS | Status: AC
  Administered 2017-06-27: 1 g via INTRAVENOUS
  Filled 2017-06-27: qty 10

## 2017-06-27 MED ORDER — DOXYCYCLINE HYCLATE 100 MG PO CAPS: 100.0000 mg | ORAL_CAPSULE | Freq: Two times a day (BID) | ORAL | 0 refills | Status: DC

## 2017-06-27 MED ORDER — CEFDINIR 300 MG PO CAPS: 300.0000 mg | ORAL_CAPSULE | Freq: Two times a day (BID) | ORAL | 0 refills | Status: DC

## 2017-06-27 MED ORDER — LIDOCAINE HCL (PF) 1 % IJ SOLN
5.0000 mL | Freq: Once | INTRAMUSCULAR | Status: AC
  Administered 2017-06-27: 5 mL
  Filled 2017-06-27: qty 5

## 2017-06-27 MED ORDER — IBUPROFEN 600 MG PO TABS: 600.0000 mg | ORAL_TABLET | Freq: Four times a day (QID) | ORAL | 0 refills | Status: DC

## 2017-06-27 NOTE — ED Notes (Addendum)
Pt c/o bed bug bites to right arm, back and neck area. PA notified.

## 2017-06-27 NOTE — ED Provider Notes (Signed)
[facility] DEPT Provider Note   CSN: [id number] Arrival date & time: 06/27/17  1310     History   Chief Complaint Chief Complaint  Patient presents with  . Abscess    HPI Hunter Reyes is a 41 y.o. male.  Patient is a 41 year old male who presents to the emergency department with a complaint of abscess to the right breast area.  The patient states he had a similar problem back in 2009. He was admitted to the hospital. He was seen by the surgeon. He states that the surgeon wanted to perform a procedure, that he did not want stay in the hospital. A incision and drainage was carried out, but the surgeon told patient that this would come back. The patient has been having problems with the right breast for nearly a month. 3 days ago the area became inflamed and has been getting progressively worse. He now has a small abscess on top of the skin, and he states he can feel something under the nipple area as well. He's not had any fever or chills to be reported. His been no drainage or bleeding from the right breast area. He is not taken anything for the pain for the swelling of the breast.      Past Medical History:  Diagnosis Date  . Arthritis    hands  . Bipolar 1 disorder ([place])   . Borderline personality disorder   . COPD (chronic obstructive pulmonary disease) ([place])    denies SOB with daily activities  . DDD (degenerative disc disease), lumbar    and thoracic  . Deliberate self-cutting    history of  . Depression   . Foot ulcer, right ([place]) 11/02/2013  . History of seizures    as a child - no seizures since before teenage years; states unknown cause  . 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 Non-restorable tooth 10/2013   multiple  . Obesity   . Osteomyelitis ([place]) 2014   right foot  . Panic attacks   . Peripheral neuropathy     Patient Active Problem List   Diagnosis Date Noted  . Loss of weight 04/30/2013  . Tobacco abuse   . Foot ulcer ([place]) 04/29/2013  .  Osteomyelitis ([place]) 04/29/2013  . Obesity, unspecified 04/29/2013    Past Surgical History:  Procedure Laterality Date  . AMPUTATION     left middle toe  . INNER EAR SURGERY Left    as a child  . MULTIPLE EXTRACTIONS WITH ALVEOLOPLASTY N/A 11/17/2013   Procedure: MULTIPLE EXTRACTION WITH ALVEOLOPLASTY;  Surgeon: Gae Bon, DDS;  Location: [facility];  Service: Oral Surgery;  Laterality: N/A;       Home Medications    Prior to Admission medications   Medication Sig Start Date End Date Taking? Authorizing Provider  acetaminophen (TYLENOL) 500 MG tablet Take 1,000 mg by mouth every 6 (six) hours as needed for mild pain or headache.   Yes [provider]  albuterol (PROVENTIL HFA;VENTOLIN HFA) 108 (90 BASE) MCG/ACT inhaler Inhale into the lungs every 6 (six) hours as needed for wheezing or shortness of breath.   Yes [provider]  gabapentin (NEURONTIN) 300 MG capsule Take 900 mg by mouth 4 (four) times daily.    Yes [provider]  sertraline (ZOLOFT) 50 MG tablet Take 25 mg by mouth daily.    Yes [provider]    Family History No family history on file.  Social History Social History  Substance Use Topics  .  Smoking status: Current Every Day Smoker    Packs/day: 1.00    Years: 15.00    Types: Cigarettes  . Smokeless tobacco: Current User    Types: Snuff  . Alcohol use Yes     Comment: occasionally     Allergies   Darvocet [propoxyphene n-acetaminophen]; Tramadol; and Other   Review of Systems Review of Systems  Constitutional: Negative for activity change.       All ROS Neg except as noted in HPI  HENT: Negative for nosebleeds.   Eyes: Negative for photophobia and discharge.  Respiratory: Negative for cough, shortness of breath and wheezing.   Cardiovascular: Negative for chest pain and palpitations.  Gastrointestinal: Negative for abdominal pain and blood in stool.  Genitourinary: Negative for dysuria, frequency and  hematuria.  Musculoskeletal: Positive for arthralgias and gait problem. Negative for back pain and neck pain.  Skin: Negative.   Neurological: Negative for dizziness, seizures and speech difficulty.  Psychiatric/Behavioral: Negative for confusion and hallucinations.     Physical Exam Updated Vital Signs BP (!) 141/79 (BP Location: Right Arm)   Pulse 84   Temp (!) 96.6 F (35.9 C) (Oral)   Resp 16   Ht 6\' 2"  (1.88 m)   Wt 131.5 kg (290 lb)   SpO2 97%   BMI 37.23 kg/m   Physical Exam  Constitutional: He is oriented to person, place, and time. He appears well-developed and well-nourished.  Non-toxic appearance.  HENT:  Head: Normocephalic.  Right Ear: Tympanic membrane and external ear normal.  Left Ear: Tympanic membrane and external ear normal.  Eyes: EOM and lids are normal. Pupils are equal, round, and reactive to light.  Neck: Normal range of motion. Neck supple. Carotid bruit is not present.  Cardiovascular: Normal rate, regular rhythm, normal heart sounds, intact distal pulses and normal pulses.   Pulmonary/Chest: Breath sounds normal. No respiratory distress.    There is an abscess of the breasts on the right. There no red streaks appreciated. The area is tender to touch. The right nipple is inverted. There is no drainage from the nipple area.  Abdominal: Soft. Bowel sounds are normal. There is no tenderness. There is no guarding.  Musculoskeletal: Normal range of motion.  Lymphadenopathy:       Head (right side): No submandibular adenopathy present.       Head (left side): No submandibular adenopathy present.    He has no cervical adenopathy.  Neurological: He is alert and oriented to person, place, and time. He has normal strength. No cranial nerve deficit or sensory deficit.  Skin: Skin is warm and dry.  There is a red macular rash on the posterior portion of the right arm and right shoulder. Patient states that he thinks he may have been exposed to bed bugs.    Psychiatric: He has a normal mood and affect. His speech is normal.  Nursing note and vitals reviewed.    ED Treatments / Results  Labs (all labs ordered are listed, but only abnormal results are displayed) Labs Reviewed - No data to display  EKG  EKG Interpretation None       Radiology No results found.  Procedures .Marland KitchenIncision and Drainage Date/Time: 06/27/2017 5:37 PM Performed by: Lily Kocher Authorized by: Lily Kocher   Consent:    Consent obtained:  Verbal   Consent given by:  Patient   Risks discussed:  Bleeding, pain and incomplete drainage   Alternatives discussed:  Referral Location:    Type:  Abscess  Location:  Trunk   Trunk location:  R breast Pre-procedure details:    Skin preparation:  Betadine Procedure type:    Complexity:  Simple Procedure details:    Incision types:  Single straight   Incision depth:  Subcutaneous   Scalpel blade:  11   Wound management:  Probed and deloculated   Drainage:  Bloody and purulent   Drainage amount:  Moderate   Wound treatment:  Wound left open Post-procedure details:    Patient tolerance of procedure:  Tolerated well, no immediate complications   (including critical care time)  Medications Ordered in ED Medications - No data to display   Initial Impression / Assessment and Plan / ED Course  I have reviewed the triage vital signs and the nursing notes.  Pertinent labs & imaging results that were available during my care of the patient were reviewed by me and considered in my medical decision making (see chart for details).      Final Clinical Impressions(s) / ED Diagnoses MDM Vital signs reviewed.  Patient has an abscess palpable under the right nipple area. There is inversion of the right nipple. There is no drainage appreciated. The area is warm to touch and tender to touch.  Lactic acid was normal at 1.3. Competence of metabolic panel is nonacute. The complete blood count shows the white  blood cells to be elevated at 12,900. There is no noted shift to the left. An ultrasound of the right breast reveals a 1.1 x 1 x 1.6 cm ill-defined hypoechoic rounded area superficially just in the area of the skin induration. There is question as to this being inflammatory, or malignant.  Patient seen with me by Dr. Lita Mains.  Case discussed with Dr. Arnoldo Morale. Dr. Arnoldo Morale asked that incision and drainage be carried out. Patient to be given antibiotics. IV Rocephin and oral doxycycline given to the patient in the emergency department. Dr. Arnoldo Morale will see the patient in the office on Tuesday, July 10. The patient is given instructions to return to the emergency department if any high fever, excessive drainage, or deterioration in the patient's general condition. Patient is in agreement with this plan. Prescription for doxycycline and Omnicef given to the patient.    Final diagnoses:  Abscess of male breast    New Prescriptions New Prescriptions   CEFDINIR (OMNICEF) 300 MG CAPSULE    Take 1 capsule (300 mg total) by mouth 2 (two) times daily.   DOXYCYCLINE (VIBRAMYCIN) 100 MG CAPSULE    Take 1 capsule (100 mg total) by mouth 2 (two) times daily.   HYDROCODONE-ACETAMINOPHEN (NORCO/VICODIN) 5-325 MG TABLET    Take 1-2 tablets by mouth every 4 (four) hours as needed.   IBUPROFEN (ADVIL,MOTRIN) 600 MG TABLET    Take 1 tablet (600 mg total) by mouth 4 (four) times daily.     Lily Kocher, PA-C 06/27/17 1736    Lily Kocher, PA-C 06/27/17 1739    Julianne Rice, MD 06/30/17 [phone]

## 2017-06-27 NOTE — Discharge Instructions (Signed)
Your ultrasound questions an abscess versus a mass of your right breast. There is also some inversion of your right nipple which is of concern. Please call Dr. Arnoldo Morale on Monday, July 9 for an appointment on July 10. Please cleanse the wound on your right breast with soap and water daily and apply fresh dressing daily. Please use doxycycline and Omnicef 2 times daily with food. Use Tylenol or ibuprofen for mild pain, use Norco for more severe pain.

## 2017-06-27 NOTE — ED Triage Notes (Signed)
Pt reports abscessed area to right breast that it has been present for years and been inflamed for 3 days

## 2017-06-28 ENCOUNTER — Telehealth: Payer: Self-pay | Admitting: *Deleted

## 2017-06-28 NOTE — Telephone Encounter (Signed)
Pharmacy called related to Rx: HYDROcodone-acetaminophen (NORCO/VICODIN) 5-325 MG tablet 1-2 tablet, Every 4 hours PRN .Marland KitchenMarland KitchenEDCM clarified with EDP ([facility]) to change Rx to: 1 tablet every 6 hours as suggested by CDC guidelines.

## 2017-07-02 ENCOUNTER — Ambulatory Visit (INDEPENDENT_AMBULATORY_CARE_PROVIDER_SITE_OTHER): Payer: Medicaid Other | Admitting: General Surgery

## 2017-07-02 ENCOUNTER — Encounter: Payer: Self-pay | Admitting: General Surgery

## 2017-07-02 VITALS — BP 136/81 | HR 95 | Temp 98.7°F | Resp 18 | Ht 74.0 in | Wt 334.0 lb

## 2017-07-02 DIAGNOSIS — N6314 Unspecified lump in the right breast, lower inner quadrant: Secondary | ICD-10-CM

## 2017-07-02 DIAGNOSIS — N63 Unspecified lump in unspecified breast: Secondary | ICD-10-CM

## 2017-07-02 LAB — AEROBIC CULTURE W GRAM STAIN (SUPERFICIAL SPECIMEN): Special Requests: NORMAL

## 2017-07-02 LAB — AEROBIC CULTURE  (SUPERFICIAL SPECIMEN)

## 2017-07-02 NOTE — Patient Instructions (Signed)
Breast Biopsy  A breast biopsy is a procedure in which a sample of suspicious breast tissue is removed from your breast. Following the procedure, the tissue or liquid that is removed from the breast is examined under a microscope to see if cancerous cells are present. You may need a breast biopsy if you have:  · Any undiagnosed breast mass (tumor).  · Nipple abnormalities, dimpling, crusting, or ulcerations.  · Abnormal discharge from the nipple, especially blood.  · Redness, swelling, and pain of the breast.  · Calcium deposits (calcifications) or abnormalities seen on a mammogram, ultrasound results, or MRI results.  · Suspicious changes in the breast seen on your mammogram.    If the breast abnormality is found to be cancerous (malignant), a breast biopsy can help to determine what the best treatment is for you. There are many different types of breast biopsies. Talk with your health care provider about your options and which type is best for you.  Tell a health care provider about:  · Any allergies you have.  · All medicines you are taking, including vitamins, herbs, eye drops, creams, and over-the-counter medicines.  · Any problems you or family members have had with anesthetic medicines.  · Any blood disorders you have.  · Any surgeries you have had.  · Any medical conditions you have.  · Whether you are pregnant or may be pregnant.  What are the risks?  Generally, this is a safe procedure. However, problems may occur, including:  · Bleeding.  · Infection.  · Discomfort. This is temporary.  · Allergic reactions to medicines.  · Bruising and swelling of the breast.  · Alteration in the shape of the breast.  · Damage to other tissues.  · Not finding the lump or abnormality.  · Needing more surgery.    What happens before the procedure?  · Plan to have someone take you home after the procedure.  · Do not use any tobacco products, such as cigarettes, chewing tobacco, and e-cigarettes. If you need help quitting,  ask your health care provider.  · Do not drink alcohol for 24 hours before the procedure.  · Ask your health care provider about:  ? Changing or stopping your regular medicines. This is especially important if you are taking diabetes medicines or blood thinners.  ? Taking medicines such as aspirin and ibuprofen. These medicines can thin your blood. Do not take these medicines before your procedure if your health care provider instructs you not to.  · Wear a good support bra to the procedure.  · Ask your health care provider how your surgical site will be marked or identified.  · You may be given antibiotic medicine to help prevent infection.  · Your health care provider may perform a procedure to place a wire (needle localization) or a seed that gives off radiation (radioactive seed localization) in the breast lump. A mammogram, ultrasound, MRI, or a combination of these techniques will be done during this procedure to identify the location of the breast abnormality. The imaging technique used will depend on the type of biopsy you are having. The wire or seed will help the health care provider locate the lump when performing the biopsy, especially if the lump cannot be felt.  What happens during the procedure?  You may be given one or both of the following:  · A medicine to numb the breast area (local anesthetic).  · A medicine to help you relax (sedative) during the procedure.      The following are the different types of biopsies that can be performed.  Fine-Needle Aspiration  A thin needle will be attached to a syringe and inserted into a breast cyst. Fluid and cells will be removed. This technique is not as common as a core needle biopsy.  Core Needle Biopsy  A wide, hollow needle (core needle) will be inserted into a breast lump multiple times to remove tissue samples or cores.  Stereotactic Biopsy  You will lie face-down on a table. Your breast will pass through an opening in the table and will be gently  compressed into a fixed position. X-ray equipment and a computer will be used to locate the breast lump. The surgeon will use this information to collect several samples of tissue using a needle collection device.  Vacuum-Assisted Biopsy  A small incision (less than ¼ inch) will be made in your breast. A biopsy device that includes a hollow needle and vacuum will be passed through the incision and into the breast tissue. The vacuum will gently draw abnormal breast tissue into the needle to remove it. No stitches (sutures) will be needed. The incision will be covered with a bandage (dressing). In this type of biopsy, a larger tissue sample is removed than in a regular core needle biopsy.  Ultrasound-Guided Core Needle Biopsy  A high-frequency ultrasound will be used to help guide the core needle to the area of the mass or abnormality. An incision will be made to insert the needle. Then tissue samples will be removed.  Surgical Biopsy  This method requires an incision in the breast to remove part or all of the suspicious tissue. After the tissue is removed, the skin over the area will be closed with sutures and covered with a dressing. There are two types of surgical biopsies:  · Incisional biopsy. The surgeon will remove part of the breast lump.  · Excisional biopsy. The surgeon will attempt to remove the whole breast lump or as much of it as possible.    After any of these procedures, the tissue or liquid that was removed will be examined under a microscope.  What happens after the procedure?  · You will be taken to the recovery area. If you are doing well and have no problems, you will be allowed to go home.  · You may notice bruising on your breast. This is normal.  · You may have a pressure dressing applied on your breast for 24-48 hours. A pressure dressing is a bandage that is wrapped tightly around the chest to stop fluid from collecting underneath tissues. You may also be advised to wear a supportive bra  during this time.  · Do not drive for 24 hours if you received a sedative.  This information is not intended to replace advice given to you by your health care provider. Make sure you discuss any questions you have with your health care provider.  Document Released: 12/10/2005 Document Revised: 04/19/2016 Document Reviewed: 09/13/2015  Elsevier Interactive Patient Education © 2018 [employer]

## 2017-07-02 NOTE — Progress Notes (Signed)
Hunter Reyes; [id number]; 20-Nov-1976   HPI   Patient is a 41 year old white male who was referred to my care for evaluation and treatment of her right breast abscess.  This was incised and drained in the emergency room.  He was started on an antibiotic.  He did have an ultrasound done at that time which revealed a small abscess, but possible concerning changes behind the nipple for malignancy.  He states he saw me 9 years ago for the same problem and that I recommended surgical excision, but he declined surgery.  He now wants to proceed with surgery.  He has not had any fevers since his ER visit.  He currently has 8/10 pain when touching the right nipple.  He states the nipple has been inverted for some time now.  No family history of breast cancer is noted. Past Medical History:  Diagnosis Date  . Arthritis    hands  . Bipolar 1 disorder ([place])   . Borderline personality disorder   . COPD (chronic obstructive pulmonary disease) ([place])    denies SOB with daily activities  . DDD (degenerative disc disease), lumbar    and thoracic  . Deliberate self-cutting    history of  . Depression   . Foot ulcer, right ([place]) 11/02/2013  . History of seizures    as a child - no seizures since before teenage years; states unknown cause  . 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 Non-restorable tooth 10/2013   multiple  . Obesity   . Osteomyelitis ([place]) 2014   right foot  . Panic attacks   . Peripheral neuropathy     Past Surgical History:  Procedure Laterality Date  . AMPUTATION     left middle toe  . INNER EAR SURGERY Left    as a child  . MULTIPLE EXTRACTIONS WITH ALVEOLOPLASTY N/A 11/17/2013   Procedure: MULTIPLE EXTRACTION WITH ALVEOLOPLASTY;  Surgeon: Gae Bon, DDS;  Location: [facility];  Service: Oral Surgery;  Laterality: N/A;    No family history on file.  Current Outpatient Prescriptions on File Prior to Visit  Medication Sig Dispense Refill  . acetaminophen (TYLENOL) 500 MG tablet Take  1,000 mg by mouth every 6 (six) hours as needed for mild pain or headache.    . albuterol (PROVENTIL HFA;VENTOLIN HFA) 108 (90 BASE) MCG/ACT inhaler Inhale into the lungs every 6 (six) hours as needed for wheezing or shortness of breath.    . cefdinir (OMNICEF) 300 MG capsule Take 1 capsule (300 mg total) by mouth 2 (two) times daily. 14 capsule 0  . doxycycline (VIBRAMYCIN) 100 MG capsule Take 1 capsule (100 mg total) by mouth 2 (two) times daily. 14 capsule 0  . gabapentin (NEURONTIN) 300 MG capsule Take 900 mg by mouth 4 (four) times daily.     Marland Kitchen HYDROcodone-acetaminophen (NORCO/VICODIN) 5-325 MG tablet Take 1-2 tablets by mouth every 4 (four) hours as needed. 20 tablet 0  . ibuprofen (ADVIL,MOTRIN) 600 MG tablet Take 1 tablet (600 mg total) by mouth 4 (four) times daily. 30 tablet 0  . sertraline (ZOLOFT) 50 MG tablet Take 25 mg by mouth daily.      No current facility-administered medications on file prior to visit.     Allergies  Allergen Reactions  . Darvocet [Propoxyphene N-Acetaminophen] Hives and Itching  . Tramadol Hives and Itching  . Other Hives    History  Alcohol Use  . Yes    Comment: occasionally    History  Smoking Status  .  Current Every Day Smoker  . Packs/day: 1.00  . Years: 15.00  . Types: Cigarettes  Smokeless Tobacco  . Current User  . Types: Snuff    Review of Systems  Constitutional: Negative.   HENT: Positive for ear pain.   Eyes: Negative.   Respiratory: Positive for shortness of breath and wheezing.   Cardiovascular: Negative.   Gastrointestinal: Negative.   Genitourinary: Negative.   Musculoskeletal: Positive for back pain, joint pain and neck pain.  Neurological: Negative.   Endo/Heme/Allergies: Negative.   Psychiatric/Behavioral: Negative.     Objective   Vitals:   07/02/17 1141  BP: 136/81  Pulse: 95  Resp: 18  Temp: 98.7 F (37.1 C)    Physical Exam  Constitutional: He is oriented to person, place, and time and  well-developed, well-nourished, and in no distress.  HENT:  Head: Normocephalic and atraumatic.  Neck: Normal range of motion. Neck supple.  Cardiovascular: Normal rate, regular rhythm and normal heart sounds.   No murmur heard. Pulmonary/Chest: Effort normal and breath sounds normal. He has no wheezes. He has no rales.  Neurological: He is alert and oriented to person, place, and time.  Skin: Skin is warm and dry.  Vitals reviewed.   Breast examination reveals a healing incision line along the inner, lower quadrant of the areolar border.  An inverted nipple is noted.  Fullness behind the nipple is present, but indistinct in nature.  The right axilla is negative for palpable nodes.  Left breast examination is unremarkable.  No dominant mass, nipple discharge, dimpling.  Axilla negative for palpable nodes.    Ultrasound report reviewed  Assessment   right breast abscess, inverted nipple, suspicious ultrasound findings Plan    patient is scheduled for a right breast biopsy on 07/12/2017.  The risks and benefits of the procedure including bleeding, infection, the possibility of malignancy fully explained to the patient, who gave informed consent.

## 2017-07-02 NOTE — H&P (Signed)
Hunter Reyes; [id number]; 06-01-1976   HPI   Patient is a 41 year old white male who was referred to my care for evaluation and treatment of her right breast abscess.  This was incised and drained in the emergency room.  He was started on an antibiotic.  He did have an ultrasound done at that time which revealed a small abscess, but possible concerning changes behind the nipple for malignancy.  He states he saw me 9 years ago for the same problem and that I recommended surgical excision, but he declined surgery.  He now wants to proceed with surgery.  He has not had any fevers since his ER visit.  He currently has 8/10 pain when touching the right nipple.  He states the nipple has been inverted for some time now.  No family history of breast cancer is noted.     Past Medical History:  Diagnosis Date  . Arthritis    hands  . Bipolar 1 disorder ([place])   . Borderline personality disorder   . COPD (chronic obstructive pulmonary disease) ([place])    denies SOB with daily activities  . DDD (degenerative disc disease), lumbar    and thoracic  . Deliberate self-cutting    history of  . Depression   . Foot ulcer, right ([place]) 11/02/2013  . History of seizures    as a child - no seizures since before teenage years; states unknown cause  . 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 Non-restorable tooth 10/2013   multiple  . Obesity   . Osteomyelitis ([place]) 2014   right foot  . Panic attacks   . Peripheral neuropathy          Past Surgical History:  Procedure Laterality Date  . AMPUTATION     left middle toe  . INNER EAR SURGERY Left    as a child  . MULTIPLE EXTRACTIONS WITH ALVEOLOPLASTY N/A 11/17/2013   Procedure: MULTIPLE EXTRACTION WITH ALVEOLOPLASTY;  Surgeon: Gae Bon, DDS;  Location: [facility];  Service: Oral Surgery;  Laterality: N/A;    No family history on file.        Current Outpatient Prescriptions on File Prior to Visit  Medication Sig Dispense Refill   . acetaminophen (TYLENOL) 500 MG tablet Take 1,000 mg by mouth every 6 (six) hours as needed for mild pain or headache.    . albuterol (PROVENTIL HFA;VENTOLIN HFA) 108 (90 BASE) MCG/ACT inhaler Inhale into the lungs every 6 (six) hours as needed for wheezing or shortness of breath.    . cefdinir (OMNICEF) 300 MG capsule Take 1 capsule (300 mg total) by mouth 2 (two) times daily. 14 capsule 0  . doxycycline (VIBRAMYCIN) 100 MG capsule Take 1 capsule (100 mg total) by mouth 2 (two) times daily. 14 capsule 0  . gabapentin (NEURONTIN) 300 MG capsule Take 900 mg by mouth 4 (four) times daily.     Marland Kitchen HYDROcodone-acetaminophen (NORCO/VICODIN) 5-325 MG tablet Take 1-2 tablets by mouth every 4 (four) hours as needed. 20 tablet 0  . ibuprofen (ADVIL,MOTRIN) 600 MG tablet Take 1 tablet (600 mg total) by mouth 4 (four) times daily. 30 tablet 0  . sertraline (ZOLOFT) 50 MG tablet Take 25 mg by mouth daily.      No current facility-administered medications on file prior to visit.         Allergies  Allergen Reactions  . Darvocet [Propoxyphene N-Acetaminophen] Hives and Itching  . Tramadol Hives and Itching  . Other Hives  History  Alcohol Use  . Yes    Comment: occasionally        History  Smoking Status  . Current Every Day Smoker  . Packs/day: 1.00  . Years: 15.00  . Types: Cigarettes  Smokeless Tobacco  . Current User  . Types: Snuff    Review of Systems  Constitutional: Negative.   HENT: Positive for ear pain.   Eyes: Negative.   Respiratory: Positive for shortness of breath and wheezing.   Cardiovascular: Negative.   Gastrointestinal: Negative.   Genitourinary: Negative.   Musculoskeletal: Positive for back pain, joint pain and neck pain.  Neurological: Negative.   Endo/Heme/Allergies: Negative.   Psychiatric/Behavioral: Negative.     Objective      Vitals:   07/02/17 1141  BP: 136/81  Pulse: 95  Resp: 18  Temp: 98.7 F (37.1 C)     Physical Exam  Constitutional: He is oriented to person, place, and time and well-developed, well-nourished, and in no distress.  HENT:  Head: Normocephalic and atraumatic.  Neck: Normal range of motion. Neck supple.  Cardiovascular: Normal rate, regular rhythm and normal heart sounds.   No murmur heard. Pulmonary/Chest: Effort normal and breath sounds normal. He has no wheezes. He has no rales.  Neurological: He is alert and oriented to person, place, and time.  Skin: Skin is warm and dry.  Vitals reviewed.   Breast examination reveals a healing incision line along the inner, lower quadrant of the areolar border.  An inverted nipple is noted.  Fullness behind the nipple is present, but indistinct in nature.  The right axilla is negative for palpable nodes.  Left breast examination is unremarkable.  No dominant mass, nipple discharge, dimpling.  Axilla negative for palpable nodes.    Ultrasound report reviewed  Assessment   right breast abscess, inverted nipple, suspicious ultrasound findings Plan    patient is scheduled for a right breast biopsy on 07/12/2017.  The risks and benefits of the procedure including bleeding, infection, the possibility of malignancy fully explained to the patient, who gave informed consent.

## 2017-07-03 ENCOUNTER — Telehealth: Payer: Self-pay | Admitting: Emergency Medicine

## 2017-07-03 NOTE — Telephone Encounter (Signed)
Post ED Visit - Positive Culture Follow-up  Culture report reviewed by antimicrobial stewardship pharmacist:  []  Elenor Quinones, Pharm.D. []  Heide Guile, Pharm.D., BCPS AQ-ID []  Parks Neptune, Pharm.D., BCPS []  Alycia Rossetti, Pharm.D., BCPS []  [place], Pharm.D., BCPS, AAHIVP []  Legrand Como, Pharm.D., BCPS, AAHIVP []  Salome Arnt, PharmD, BCPS []  Dimitri Ped, PharmD, BCPS []  Vincenza Hews, PharmD, BCPS  Positive wound culture Treated with doxycycline and cefdinir, organism sensitive to the same and no further patient follow-up is required at this time.  Hazle Nordmann 07/03/2017, 4:31 PM

## 2017-07-09 ENCOUNTER — Encounter (HOSPITAL_COMMUNITY)
Admission: RE | Admit: 2017-07-09 | Discharge: 2017-07-09 | Disposition: A | Discharge status: Home / Self Care | Payer: Medicaid Other | Source: Ambulatory Visit | Attending: General Surgery | Admitting: General Surgery

## 2017-07-09 ENCOUNTER — Encounter (HOSPITAL_COMMUNITY): Payer: Self-pay

## 2017-07-09 HISTORY — DX: Polyneuropathy, unspecified: G62.9

## 2017-07-12 ENCOUNTER — Encounter (HOSPITAL_COMMUNITY)
Admission: RE | Disposition: A | Discharge status: Home / Self Care | Payer: Self-pay | Source: Ambulatory Visit | Attending: General Surgery

## 2017-07-12 ENCOUNTER — Ambulatory Visit (HOSPITAL_COMMUNITY): Payer: Medicaid Other | Admitting: Anesthesiology

## 2017-07-12 ENCOUNTER — Ambulatory Visit (HOSPITAL_COMMUNITY)
Admission: RE | Admit: 2017-07-12 | Discharge: 2017-07-12 | Disposition: A | Discharge status: Home / Self Care | Payer: Medicaid Other | Source: Ambulatory Visit | Attending: General Surgery | Admitting: General Surgery

## 2017-07-12 DIAGNOSIS — Z6841 Body Mass Index (BMI) 40.0 and over, adult: Secondary | ICD-10-CM | POA: Insufficient documentation

## 2017-07-12 DIAGNOSIS — Z79899 Other long term (current) drug therapy: Secondary | ICD-10-CM | POA: Insufficient documentation

## 2017-07-12 DIAGNOSIS — J449 Chronic obstructive pulmonary disease, unspecified: Secondary | ICD-10-CM | POA: Insufficient documentation

## 2017-07-12 DIAGNOSIS — Z791 Long term (current) use of non-steroidal anti-inflammatories (NSAID): Secondary | ICD-10-CM | POA: Diagnosis not present

## 2017-07-12 DIAGNOSIS — N611 Abscess of the breast and nipple: Secondary | ICD-10-CM | POA: Insufficient documentation

## 2017-07-12 DIAGNOSIS — F17210 Nicotine dependence, cigarettes, uncomplicated: Secondary | ICD-10-CM | POA: Insufficient documentation

## 2017-07-12 DIAGNOSIS — N6314 Unspecified lump in the right breast, lower inner quadrant: Secondary | ICD-10-CM | POA: Diagnosis not present

## 2017-07-12 DIAGNOSIS — G629 Polyneuropathy, unspecified: Secondary | ICD-10-CM | POA: Insufficient documentation

## 2017-07-12 DIAGNOSIS — Z89422 Acquired absence of other left toe(s): Secondary | ICD-10-CM | POA: Insufficient documentation

## 2017-07-12 DIAGNOSIS — N63 Unspecified lump in unspecified breast: Secondary | ICD-10-CM

## 2017-07-12 DIAGNOSIS — F329 Major depressive disorder, single episode, unspecified: Secondary | ICD-10-CM | POA: Insufficient documentation

## 2017-07-12 HISTORY — PX: BREAST BIOPSY: SHX20

## 2017-07-12 SURGERY — BREAST BIOPSY
Anesthesia: General | Laterality: Right

## 2017-07-12 MED ORDER — CEFAZOLIN SODIUM-DEXTROSE 1-4 GM/50ML-% IV SOLN
INTRAVENOUS | Status: AC
  Filled 2017-07-12: qty 50

## 2017-07-12 MED ORDER — HYDROCODONE-ACETAMINOPHEN 5-325 MG PO TABS: 1.0000 | ORAL_TABLET | ORAL | 0 refills | Status: DC | PRN

## 2017-07-12 MED ORDER — FENTANYL CITRATE (PF) 100 MCG/2ML IJ SOLN: 25.0000 ug | INTRAMUSCULAR | Status: DC | PRN

## 2017-07-12 MED ORDER — CEFAZOLIN SODIUM-DEXTROSE 2-4 GM/100ML-% IV SOLN
INTRAVENOUS | Status: AC
  Filled 2017-07-12: qty 100

## 2017-07-12 MED ORDER — HYDROCODONE-ACETAMINOPHEN 5-325 MG PO TABS
1.0000 | ORAL_TABLET | Freq: Once | ORAL | Status: AC
  Administered 2017-07-12: 1 via ORAL

## 2017-07-12 MED ORDER — ONDANSETRON HCL 4 MG/2ML IJ SOLN
4.0000 mg | Freq: Once | INTRAMUSCULAR | Status: AC
  Administered 2017-07-12: 4 mg via INTRAVENOUS

## 2017-07-12 MED ORDER — LIDOCAINE HCL (PF) 1 % IJ SOLN
INTRAMUSCULAR | Status: AC
  Filled 2017-07-12: qty 5

## 2017-07-12 MED ORDER — BUPIVACAINE HCL (PF) 0.5 % IJ SOLN
INTRAMUSCULAR | Status: DC | PRN
  Administered 2017-07-12: 10 mL

## 2017-07-12 MED ORDER — CEFAZOLIN SODIUM-DEXTROSE 2-4 GM/100ML-% IV SOLN: 2.0000 g | Freq: Once | INTRAVENOUS | Status: DC

## 2017-07-12 MED ORDER — GLYCOPYRROLATE 0.2 MG/ML IJ SOLN
INTRAMUSCULAR | Status: AC
  Filled 2017-07-12: qty 1

## 2017-07-12 MED ORDER — KETOROLAC TROMETHAMINE 30 MG/ML IJ SOLN
INTRAMUSCULAR | Status: AC
  Filled 2017-07-12: qty 1

## 2017-07-12 MED ORDER — 0.9 % SODIUM CHLORIDE (POUR BTL) OPTIME
TOPICAL | Status: DC | PRN
  Administered 2017-07-12: 1000 mL

## 2017-07-12 MED ORDER — LACTATED RINGERS IV SOLN
INTRAVENOUS | Status: DC
  Administered 2017-07-12: 07:00:00 via INTRAVENOUS

## 2017-07-12 MED ORDER — PROPOFOL 10 MG/ML IV BOLUS
INTRAVENOUS | Status: DC | PRN
  Administered 2017-07-12: 200 mg via INTRAVENOUS

## 2017-07-12 MED ORDER — LIDOCAINE HCL 1 % IJ SOLN
INTRAMUSCULAR | Status: DC | PRN
  Administered 2017-07-12: 40 mg via INTRADERMAL

## 2017-07-12 MED ORDER — DEXTROSE 5 % IV SOLN
3.0000 g | INTRAVENOUS | Status: DC
  Administered 2017-07-12: 3 g via INTRAVENOUS
  Filled 2017-07-12: qty 3000

## 2017-07-12 MED ORDER — PROPOFOL 10 MG/ML IV BOLUS
INTRAVENOUS | Status: AC
  Filled 2017-07-12: qty 40

## 2017-07-12 MED ORDER — ONDANSETRON HCL 4 MG/2ML IJ SOLN
INTRAMUSCULAR | Status: AC
  Filled 2017-07-12: qty 2

## 2017-07-12 MED ORDER — MIDAZOLAM HCL 2 MG/2ML IJ SOLN
INTRAMUSCULAR | Status: AC
  Filled 2017-07-12: qty 2

## 2017-07-12 MED ORDER — CEFAZOLIN SODIUM-DEXTROSE 1-4 GM/50ML-% IV SOLN: 1.0000 g | Freq: Once | INTRAVENOUS | Status: DC

## 2017-07-12 MED ORDER — SUCCINYLCHOLINE CHLORIDE 20 MG/ML IJ SOLN
INTRAMUSCULAR | Status: AC
  Filled 2017-07-12: qty 1

## 2017-07-12 MED ORDER — HYDROCODONE-ACETAMINOPHEN 5-325 MG PO TABS
ORAL_TABLET | ORAL | Status: AC
  Filled 2017-07-12: qty 1

## 2017-07-12 MED ORDER — MIDAZOLAM HCL 5 MG/5ML IJ SOLN
INTRAMUSCULAR | Status: DC | PRN
  Administered 2017-07-12: 2 mg via INTRAVENOUS

## 2017-07-12 MED ORDER — FENTANYL CITRATE (PF) 100 MCG/2ML IJ SOLN
INTRAMUSCULAR | Status: DC | PRN
  Administered 2017-07-12: 50 ug via INTRAVENOUS

## 2017-07-12 MED ORDER — MIDAZOLAM HCL 2 MG/2ML IJ SOLN
1.0000 mg | INTRAMUSCULAR | Status: AC
  Administered 2017-07-12: 2 mg via INTRAVENOUS

## 2017-07-12 MED ORDER — BUPIVACAINE HCL (PF) 0.5 % IJ SOLN
INTRAMUSCULAR | Status: AC
  Filled 2017-07-12: qty 30

## 2017-07-12 MED ORDER — PROPOFOL 10 MG/ML IV BOLUS
INTRAVENOUS | Status: AC
  Filled 2017-07-12: qty 20

## 2017-07-12 MED ORDER — GLYCOPYRROLATE 0.2 MG/ML IJ SOLN
0.2000 mg | Freq: Once | INTRAMUSCULAR | Status: AC
  Administered 2017-07-12: 0.2 mg via INTRAVENOUS

## 2017-07-12 MED ORDER — CHLORHEXIDINE GLUCONATE CLOTH 2 % EX PADS: 6.0000 | MEDICATED_PAD | Freq: Once | CUTANEOUS | Status: DC

## 2017-07-12 MED ORDER — KETOROLAC TROMETHAMINE 30 MG/ML IJ SOLN
30.0000 mg | Freq: Once | INTRAMUSCULAR | Status: AC
  Administered 2017-07-12: 30 mg via INTRAVENOUS

## 2017-07-12 MED ORDER — FENTANYL CITRATE (PF) 250 MCG/5ML IJ SOLN
INTRAMUSCULAR | Status: AC
  Filled 2017-07-12: qty 5

## 2017-07-12 SURGICAL SUPPLY — 33 items
ADH SKN CLS APL DERMABOND .7 (GAUZE/BANDAGES/DRESSINGS) ×1
BAG HAMPER (MISCELLANEOUS) ×3 IMPLANT
BLADE SURG 15 STRL LF DISP TIS (BLADE) ×1 IMPLANT
BLADE SURG 15 STRL SS (BLADE) ×3
CHLORAPREP W/TINT 26ML (MISCELLANEOUS) ×3 IMPLANT
CLOTH BEACON ORANGE TIMEOUT ST (SAFETY) ×3 IMPLANT
COVER LIGHT HANDLE STERIS (MISCELLANEOUS) ×6 IMPLANT
DECANTER SPIKE VIAL GLASS SM (MISCELLANEOUS) ×3 IMPLANT
DERMABOND ADVANCED (GAUZE/BANDAGES/DRESSINGS) ×2
DERMABOND ADVANCED .7 DNX12 (GAUZE/BANDAGES/DRESSINGS) ×1 IMPLANT
ELECT REM PT RETURN 9FT ADLT (ELECTROSURGICAL) ×3
ELECTRODE REM PT RTRN 9FT ADLT (ELECTROSURGICAL) ×1 IMPLANT
FORMALIN 10 PREFIL 120ML (MISCELLANEOUS) ×3 IMPLANT
GLOVE BIO SURGEON STRL SZ7 (GLOVE) ×4 IMPLANT
GLOVE BIOGEL PI IND STRL 7.0 (GLOVE) ×1 IMPLANT
GLOVE BIOGEL PI INDICATOR 7.0 (GLOVE) ×2
GLOVE SURG SS PI 7.5 STRL IVOR (GLOVE) ×6 IMPLANT
GOWN STRL REUS W/TWL LRG LVL3 (GOWN DISPOSABLE) ×6 IMPLANT
KIT ROOM TURNOVER APOR (KITS) ×3 IMPLANT
MANIFOLD NEPTUNE II (INSTRUMENTS) ×3 IMPLANT
NDL HYPO 18GX1.5 BLUNT FILL (NEEDLE) ×1 IMPLANT
NDL HYPO 25X1 1.5 SAFETY (NEEDLE) ×1 IMPLANT
NEEDLE HYPO 18GX1.5 BLUNT FILL (NEEDLE) ×3 IMPLANT
NEEDLE HYPO 25X1 1.5 SAFETY (NEEDLE) ×3 IMPLANT
NS IRRIG 1000ML POUR BTL (IV SOLUTION) ×3 IMPLANT
PACK MINOR (CUSTOM PROCEDURE TRAY) ×3 IMPLANT
PAD ARMBOARD 7.5X6 YLW CONV (MISCELLANEOUS) ×3 IMPLANT
SET BASIN LINEN APH (SET/KITS/TRAYS/PACK) ×3 IMPLANT
SUT SILK 2 0 SH (SUTURE) IMPLANT
SUT VIC AB 3-0 SH 27 (SUTURE) ×3
SUT VIC AB 3-0 SH 27X BRD (SUTURE) ×1 IMPLANT
SUT VIC AB 4-0 PS2 27 (SUTURE) ×3 IMPLANT
SYR CONTROL 10ML LL (SYRINGE) ×3 IMPLANT

## 2017-07-12 NOTE — Transfer of Care (Signed)
Immediate Anesthesia Transfer of Care Note  Patient: Hunter Reyes  Procedure(s) Performed: Procedure(s): BREAST BIOPSY (Right)  Patient Location: PACU  Anesthesia Type:General  Level of Consciousness: awake and patient cooperative  Airway & Oxygen Therapy: Patient Spontanous Breathing and Patient connected to face mask oxygen  Post-op Assessment: Report given to RN and Patient moving all extremities  Post vital signs: Reviewed and stable  Last Vitals:  Vitals:   07/12/17 0700 07/12/17 0715  BP: 114/64 118/67  Pulse:    Resp: 18 16  Temp:      Last Pain:  Vitals:   07/12/17 0653  TempSrc: Oral  PainSc: 8       Patients Stated Pain Goal: 6 (99/06/89 3406)  Complications: No apparent anesthesia complications

## 2017-07-12 NOTE — Interval H&P Note (Signed)
History and Physical Interval Note:  07/12/2017 7:11 AM  Hunter Reyes  has presented today for surgery, with the diagnosis of breast mass in a male  The various methods of treatment have been discussed with the patient and family. After consideration of risks, benefits and other options for treatment, the patient has consented to  Procedure(s): BREAST BIOPSY (Right) as a surgical intervention .  The patient's history has been reviewed, patient examined, no change in status, stable for surgery.  I have reviewed the patient's chart and labs.  Questions were answered to the patient's satisfaction.     Aviva Signs

## 2017-07-12 NOTE — Discharge Instructions (Signed)
Breast Biopsy A breast biopsy is a test during which a sample of tissue is taken from your breast. The breast tissue is looked at under a microscope for cancer cells. What happens before the procedure?  Plan to have someone take you home after the test.  Do not use tobacco products. These include cigarettes, chewing tobacco, or e-cigarettes. If you need help quitting, ask your doctor.  Do not drink alcohol for 24 hours before the test.  Ask your doctor about: ? Changing or stopping your normal medicines. This is important if you take diabetes medicines or blood thinners. ? Taking medicines such as aspirin and ibuprofen. These medicines can thin your blood. Do not take these medicines before your procedure if your doctor tells you not to.  Wear a good support bra to the test.  Ask your doctor how your surgical site will be marked or identified.  You may be given antibiotic medicine to help prevent infection.  You may be checked for extra fluid in your body (lymphedema).  Your doctor may place a wire or a seed in the lump. The wire or seed gives off radiation. This will help your doctor to see the lump during the biopsy. What happens during the procedure? You may be given the following:  A medicine to numb the breast area (local anesthetic).  A medicine to help you relax (sedative).  There are different types of breast biopsies. Each type is described below. Fine-Needle Aspiration  A needle will be put into the breast lump.  The needle will take out fluid and cells from the lump. Core-Needle Biopsy  A needle will be put into the breast lump.  The needle will be put into your breast several times.  The needle will remove breast tissue. Stereotactic Biopsy  You will lie on a table on your belly. Your breast will pass through a hole in the table. Your breast will be held in place.  X-rays and a computer will be used to locate the breast lump.  A needle will be used to  remove tissue samples from your breast. Vacuum-Assisted Biopsy  A small cut (incision) will be made in your breast.  A biopsy device will be put through the cut and into the breast tissue.  The biopsy device will draw abnormal breast tissue into the biopsy device.  A large tissue sample will often be removed.  No stitches will be needed. Ultrasound-Guided Core-Needle Biopsy  Ultrasound imaging will help guide the needle into the area of the breast that is not normal.  A cut will be made in the breast. The needle will be put into the breast lump.  Tissue samples will be taken out. Surgical Biopsy  A cut will be made in the breast to remove tissue.  The cut will be closed with stitches and covered with a bandage.  There are two types: ? Incisional biopsy. Your doctor will remove part of the breast lump. ? Excisional biopsy. Your doctor will try to remove the whole breast lump or as much as possible. All tissue or fluid samples will be looked at under a microscope. What happens after the procedure?  You will be able to go home when you are doing well and you are not having problems.  You may have bruising on your breast. This is normal.  A pressure bandage (dressing) may be put on your breast. It may be left on for 24-48 hours. This type of bandage is wrapped tightly around your chest.  It helps to stop fluid from building up under tissues. You may also need to wear a supportive bra during this time.  Do not drive for 24 hours if you received a sedative. This information is not intended to replace advice given to you by your health care provider. Make sure you discuss any questions you have with your health care provider. Document Released: 03/03/2012 Document Revised: 08/16/2016 Document Reviewed: 09/13/2015 Elsevier Interactive Patient Education  [employer].

## 2017-07-12 NOTE — Op Note (Signed)
Patient:  Hunter Reyes  DOB:  1976/06/07  MRN:  [id number]   Preop Diagnosis:  Right breast mass  Postop Diagnosis:  Same  Procedure:  Right breast biopsy  Surgeon:  Aviva Signs, M.D.  Anes:  Gen.  Indications:  Patient is a 41 year old white male who presents with a right breast mass with an inverted nipple. The patient now presents for a biopsy. The risks and benefits of the procedure were fully explained to the patient, who gave informed consent.  Procedure note:  The patient was placed in supine position. After general anesthesia was monitored, the right breast was prepped and draped using usual sterile technique with DuraPrep. Surgical site confirmation was performed.  A curvilinear incision was made along the areolar border inferiorly. The tissue behind the nipple was then excised without difficulty. It was sent to pathology for further examination. Granulomatous tissue was found. A bleeding was controlled using Bovie electrocautery. 0.5% Sensorcaine was instilled into the surrounding wound. The skin was closed using a 4-0 Vicryl subcuticular suture. Dermabond was applied.  All tape and needle counts were correct at the end of the procedure. The patient was awakened and transferred to PACU in stable condition.  Complications:  None  EBL:  Minimal  Specimen:  Right breast tissue

## 2017-07-12 NOTE — Anesthesia Preprocedure Evaluation (Signed)
Anesthesia Evaluation  Patient identified by MRN, date of birth, ID band Patient awake    Reviewed: Allergy & Precautions, NPO status , Patient's Chart, lab work & pertinent test results  Airway Mallampati: III  TM Distance: >3 FB Neck ROM: Full    Dental  (+) Poor Dentition, Missing, Chipped, Dental Advisory Given   Pulmonary COPD, Current Smoker,    breath sounds clear to auscultation       Cardiovascular negative cardio ROS   Rhythm:Regular Rate:Normal     Neuro/Psych PSYCHIATRIC DISORDERS Anxiety Depression Bipolar Disorder  Neuromuscular disease    GI/Hepatic negative GI ROS, Neg liver ROS, neg GERD  ,  Endo/Other  Morbid obesity  Renal/GU negative Renal ROS     Musculoskeletal  (+) Arthritis ,   Abdominal   Peds  Hematology   Anesthesia Other Findings   Reproductive/Obstetrics                             Anesthesia Physical Anesthesia Plan  ASA: III  Anesthesia Plan: General   Post-op Pain Management:    Induction: Intravenous  PONV Risk Score and Plan:   Airway Management Planned: LMA  Additional Equipment:   Intra-op Plan:   Post-operative Plan: Extubation in OR  Informed Consent: I have reviewed the patients History and Physical, chart, labs and discussed the procedure including the risks, benefits and alternatives for the proposed anesthesia with the patient or authorized representative who has indicated his/her understanding and acceptance.     Plan Discussed with:   Anesthesia Plan Comments:         Anesthesia Quick Evaluation

## 2017-07-12 NOTE — Anesthesia Postprocedure Evaluation (Signed)
Anesthesia Post Note  Patient: Hunter Reyes  Procedure(s) Performed: Procedure(s) (LRB): BREAST BIOPSY (Right)  Patient location during evaluation: PACU Anesthesia Type: General Level of consciousness: awake and alert and patient cooperative Pain management: pain level controlled Vital Signs Assessment: post-procedure vital signs reviewed and stable Respiratory status: spontaneous breathing, nonlabored ventilation and respiratory function stable Cardiovascular status: blood pressure returned to baseline Postop Assessment: no signs of nausea or vomiting Anesthetic complications: no     Last Vitals:  Vitals:   07/12/17 0715 07/12/17 0817  BP: 118/67 116/68  Pulse:  80  Resp: 16 (P) 16  Temp:  (P) 36.7 C    Last Pain:  Vitals:   07/12/17 0817  TempSrc:   PainSc: (P) 0-No pain                 Keen Ewalt J

## 2017-07-12 NOTE — Anesthesia Procedure Notes (Signed)
Procedure Name: LMA Insertion Date/Time: 07/12/2017 7:39 AM Performed by: Charmaine Downs Pre-anesthesia Checklist: Patient identified, Patient being monitored, Emergency Drugs available, Timeout performed and Suction available Patient Re-evaluated:Patient Re-evaluated prior to induction Oxygen Delivery Method: Circle System Utilized Preoxygenation: Pre-oxygenation with 100% oxygen Induction Type: IV induction Ventilation: Mask ventilation without difficulty LMA: LMA inserted LMA Size: 4.0 Grade View: Grade III Number of attempts: 1 Placement Confirmation: positive ETCO2 and breath sounds checked- equal and bilateral Tube secured with: Tape Dental Injury: Teeth and Oropharynx as per pre-operative assessment

## 2017-07-15 ENCOUNTER — Encounter (HOSPITAL_COMMUNITY): Payer: Self-pay | Admitting: General Surgery

## 2017-07-16 ENCOUNTER — Encounter: Payer: Self-pay | Admitting: General Surgery

## 2017-07-18 ENCOUNTER — Encounter: Payer: Self-pay | Admitting: General Surgery

## 2017-07-18 ENCOUNTER — Ambulatory Visit (INDEPENDENT_AMBULATORY_CARE_PROVIDER_SITE_OTHER): Payer: Self-pay | Admitting: General Surgery

## 2017-07-18 VITALS — BP 131/77 | HR 86 | Temp 96.8°F | Resp 18 | Ht 74.0 in | Wt 329.0 lb

## 2017-07-18 DIAGNOSIS — Z09 Encounter for follow-up examination after completed treatment for conditions other than malignant neoplasm: Secondary | ICD-10-CM

## 2017-07-18 MED ORDER — HYDROCODONE-ACETAMINOPHEN 5-325 MG PO TABS: 1.0000 | ORAL_TABLET | ORAL | 0 refills | Status: DC | PRN

## 2017-07-18 NOTE — Progress Notes (Signed)
Subjective:     Hunter Reyes  Status post right breast biopsy. Patient doing well. Objective:    BP 131/77   Pulse 86   Temp (!) 96.8 F (36 C)   Resp 18   Ht 6\' 2"  (1.88 m)   Wt (!) 329 lb (149.2 kg)   BMI 42.24 kg/m   General:  alert, cooperative and no distress  Right breast incision healing well. No hematoma or seroma present. Final pathology negative for malignancy.     Assessment:    Doing well postoperatively.    Plan:   Keep wound clean with soap and water. Follow-up here in 3 weeks. Norco represcribed for pain. I told him that this was his last prescription for pain.

## 2017-08-08 ENCOUNTER — Encounter: Payer: Self-pay | Admitting: General Surgery

## 2017-08-08 ENCOUNTER — Ambulatory Visit (INDEPENDENT_AMBULATORY_CARE_PROVIDER_SITE_OTHER): Payer: Self-pay | Admitting: General Surgery

## 2017-08-08 VITALS — BP 139/79 | HR 76 | Temp 96.6°F | Resp 18 | Ht 74.0 in | Wt 334.0 lb

## 2017-08-08 DIAGNOSIS — Z09 Encounter for follow-up examination after completed treatment for conditions other than malignant neoplasm: Secondary | ICD-10-CM

## 2017-08-08 NOTE — Progress Notes (Signed)
Subjective:     Hunter Reyes  Status post incision and drainage of right breast abscess. Healing well. Objective:    BP 139/79   Pulse 76   Temp (!) 96.6 F (35.9 C)   Resp 18   Ht 6\' 2"  (1.88 m)   Wt (!) 334 lb (151.5 kg)   BMI 42.88 kg/m   General:  alert, cooperative and no distress  Right breast incision with small retained Vicryl suture. This was removed without difficulty. No purulent drainage noted.     Assessment:    Doing well postoperatively.    Plan:   Follow-up as needed.

## 2017-08-15 ENCOUNTER — Ambulatory Visit: Payer: Medicaid Other | Admitting: Podiatry

## 2017-09-26 ENCOUNTER — Ambulatory Visit (INDEPENDENT_AMBULATORY_CARE_PROVIDER_SITE_OTHER): Payer: Medicaid Other

## 2017-09-26 ENCOUNTER — Encounter: Payer: Self-pay | Admitting: Podiatry

## 2017-09-26 ENCOUNTER — Ambulatory Visit (INDEPENDENT_AMBULATORY_CARE_PROVIDER_SITE_OTHER): Payer: Medicaid Other | Admitting: Podiatry

## 2017-09-26 DIAGNOSIS — M79676 Pain in unspecified toe(s): Secondary | ICD-10-CM | POA: Diagnosis not present

## 2017-09-26 DIAGNOSIS — L97502 Non-pressure chronic ulcer of other part of unspecified foot with fat layer exposed: Secondary | ICD-10-CM

## 2017-09-26 DIAGNOSIS — M86171 Other acute osteomyelitis, right ankle and foot: Secondary | ICD-10-CM

## 2017-09-26 DIAGNOSIS — B351 Tinea unguium: Secondary | ICD-10-CM

## 2017-09-26 DIAGNOSIS — G629 Polyneuropathy, unspecified: Secondary | ICD-10-CM

## 2017-09-26 DIAGNOSIS — M79674 Pain in right toe(s): Secondary | ICD-10-CM

## 2017-09-26 DIAGNOSIS — L84 Corns and callosities: Secondary | ICD-10-CM

## 2017-09-26 DIAGNOSIS — M79675 Pain in left toe(s): Secondary | ICD-10-CM

## 2017-09-26 MED ORDER — DOXYCYCLINE HYCLATE 100 MG PO CAPS: 100.0000 mg | ORAL_CAPSULE | Freq: Two times a day (BID) | ORAL | 0 refills | Status: DC

## 2017-09-26 MED ORDER — MUPIROCIN 2 % EX OINT: 1.0000 "application " | TOPICAL_OINTMENT | Freq: Two times a day (BID) | CUTANEOUS | 2 refills | Status: DC

## 2017-09-28 ENCOUNTER — Emergency Department (HOSPITAL_COMMUNITY)
Admission: EM | Admit: 2017-09-28 | Discharge: 2017-09-28 | Disposition: A | Discharge status: Home / Self Care | Payer: Medicaid Other | Attending: Emergency Medicine | Admitting: Emergency Medicine

## 2017-09-28 ENCOUNTER — Emergency Department (HOSPITAL_COMMUNITY): Payer: Medicaid Other

## 2017-09-28 ENCOUNTER — Other Ambulatory Visit: Payer: Self-pay

## 2017-09-28 ENCOUNTER — Encounter (HOSPITAL_COMMUNITY): Payer: Self-pay | Admitting: Emergency Medicine

## 2017-09-28 DIAGNOSIS — L089 Local infection of the skin and subcutaneous tissue, unspecified: Secondary | ICD-10-CM | POA: Diagnosis not present

## 2017-09-28 DIAGNOSIS — Y929 Unspecified place or not applicable: Secondary | ICD-10-CM | POA: Diagnosis not present

## 2017-09-28 DIAGNOSIS — F17210 Nicotine dependence, cigarettes, uncomplicated: Secondary | ICD-10-CM | POA: Insufficient documentation

## 2017-09-28 DIAGNOSIS — T148XXA Other injury of unspecified body region, initial encounter: Secondary | ICD-10-CM | POA: Diagnosis not present

## 2017-09-28 DIAGNOSIS — R109 Unspecified abdominal pain: Secondary | ICD-10-CM | POA: Insufficient documentation

## 2017-09-28 DIAGNOSIS — Z79899 Other long term (current) drug therapy: Secondary | ICD-10-CM | POA: Insufficient documentation

## 2017-09-28 DIAGNOSIS — Y33XXXA Other specified events, undetermined intent, initial encounter: Secondary | ICD-10-CM | POA: Diagnosis not present

## 2017-09-28 DIAGNOSIS — Y999 Unspecified external cause status: Secondary | ICD-10-CM | POA: Insufficient documentation

## 2017-09-28 DIAGNOSIS — J449 Chronic obstructive pulmonary disease, unspecified: Secondary | ICD-10-CM | POA: Diagnosis not present

## 2017-09-28 DIAGNOSIS — Z4801 Encounter for change or removal of surgical wound dressing: Secondary | ICD-10-CM | POA: Diagnosis present

## 2017-09-28 DIAGNOSIS — Y939 Activity, unspecified: Secondary | ICD-10-CM | POA: Insufficient documentation

## 2017-09-28 DIAGNOSIS — R52 Pain, unspecified: Secondary | ICD-10-CM

## 2017-09-28 LAB — COMPREHENSIVE METABOLIC PANEL
ALT: 28 U/L (ref 17–63)
ANION GAP: 8 (ref 5–15)
AST: 27 U/L (ref 15–41)
Albumin: 3.8 g/dL (ref 3.5–5.0)
Alkaline Phosphatase: 77 U/L (ref 38–126)
BUN: 8 mg/dL (ref 6–20)
CHLORIDE: 104 mmol/L (ref 101–111)
CO2: 26 mmol/L (ref 22–32)
Calcium: 9 mg/dL (ref 8.9–10.3)
Creatinine, Ser: 0.95 mg/dL (ref 0.61–1.24)
GFR calc non Af Amer: >60 mL/min (ref >60)
Glucose, Bld: 91 mg/dL (ref 65–99)
POTASSIUM: 4 mmol/L (ref 3.5–5.1)
SODIUM: 138 mmol/L (ref 135–145)
Total Bilirubin: 0.6 mg/dL (ref 0.3–1.2)
Total Protein: 7.6 g/dL (ref 6.5–8.1)

## 2017-09-28 LAB — CBC WITH DIFFERENTIAL/PLATELET
Basophils Absolute: 0.1 10*3/uL (ref 0.0–0.1)
Basophils Relative: 1 %
Eosinophils Absolute: 0.6 10*3/uL (ref 0.0–0.7)
Eosinophils Relative: 5 %
HEMATOCRIT: 50.3 % (ref 39.0–52.0)
HEMOGLOBIN: 17.5 g/dL — AB (ref 13.0–17.0)
LYMPHS ABS: 3.8 10*3/uL (ref 0.7–4.0)
Lymphocytes Relative: 31 %
MCH: 31 pg (ref 26.0–34.0)
MCHC: 34.8 g/dL (ref 30.0–36.0)
MCV: 89.2 fL (ref 78.0–100.0)
MONOS PCT: 14 %
Monocytes Absolute: 1.7 10*3/uL — ABNORMAL HIGH (ref 0.1–1.0)
NEUTROS ABS: 6.3 10*3/uL (ref 1.7–7.7)
NEUTROS PCT: 49 %
PLATELETS: 244 10*3/uL (ref 150–400)
RBC: 5.64 MIL/uL (ref 4.22–5.81)
RDW: 12.9 % (ref 11.5–15.5)
WBC: 12.5 10*3/uL — ABNORMAL HIGH (ref 4.0–10.5)

## 2017-09-28 LAB — LACTIC ACID, PLASMA
Lactic Acid, Venous: 1.6 mmol/L (ref 0.5–1.9)
Lactic Acid, Venous: 1.1 mmol/L (ref 0.5–1.9)

## 2017-09-28 MED ORDER — IBUPROFEN 800 MG PO TABS
800.0000 mg | ORAL_TABLET | Freq: Once | ORAL | Status: AC
  Administered 2017-09-28: 800 mg via ORAL
  Filled 2017-09-28: qty 1

## 2017-09-28 MED ORDER — CEPHALEXIN 500 MG PO CAPS: 500.0000 mg | ORAL_CAPSULE | Freq: Four times a day (QID) | ORAL | 0 refills | Status: DC

## 2017-09-28 MED ORDER — CEPHALEXIN 500 MG PO CAPS
500.0000 mg | ORAL_CAPSULE | Freq: Once | ORAL | Status: AC
  Administered 2017-09-28: 500 mg via ORAL
  Filled 2017-09-28: qty 1

## 2017-09-28 NOTE — ED Provider Notes (Signed)
[facility] DEPT Provider Note   CSN: [id number] Arrival date & time: 09/28/17  1304     History   Chief Complaint Chief Complaint  Patient presents with  . Wound Check    HPI Hunter Reyes is a 41 y.o. male.  Patient complains of tenderness around his umbilicus. He also states he's had some drainage from that   The history is provided by the patient. No language interpreter was used.  Wound Check  This is a new problem. The current episode started 2 days ago. The problem occurs constantly. The problem has not changed since onset.Associated symptoms include abdominal pain. Pertinent negatives include no chest pain and no headaches. Nothing aggravates the symptoms. Nothing relieves the symptoms. He has tried nothing for the symptoms. The treatment provided no relief.    Past Medical History:  Diagnosis Date  . Arthritis    hands  . Bipolar 1 disorder ([place])   . Borderline personality disorder ([place])   . COPD (chronic obstructive pulmonary disease) ([place])    denies SOB with daily activities  . DDD (degenerative disc disease), lumbar    and thoracic  . Deliberate self-cutting    history of  . Depression   . Foot ulcer, right ([place]) 11/02/2013  . History of seizures    as a child - no seizures since before teenage years; states unknown cause  . 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 Neuropathy   . Non-restorable tooth 10/2013   multiple  . Obesity   . Osteomyelitis ([place]) 2014   right foot  . Panic attacks   . Peripheral neuropathy     Patient Active Problem List   Diagnosis Date Noted  . Breast mass in male   . Loss of weight 04/30/2013  . Tobacco abuse   . Foot ulcer ([place]) 04/29/2013  . Osteomyelitis ([place]) 04/29/2013  . Obesity, unspecified 04/29/2013    Past Surgical History:  Procedure Laterality Date  . AMPUTATION     left middle toe  . BREAST BIOPSY Right 07/12/2017   Procedure: BREAST BIOPSY;  Surgeon: Aviva Signs, MD;  Location: [facility];  Service:  General;  Laterality: Right;  . INNER EAR SURGERY Left    as a child  . MULTIPLE EXTRACTIONS WITH ALVEOLOPLASTY N/A 11/17/2013   Procedure: MULTIPLE EXTRACTION WITH ALVEOLOPLASTY;  Surgeon: Gae Bon, DDS;  Location: [facility];  Service: Oral Surgery;  Laterality: N/A;       Home Medications    Prior to Admission medications   Medication Sig Start Date End Date Taking? Authorizing Provider  acetaminophen (TYLENOL) 500 MG tablet Take 1,000 mg by mouth every 6 (six) hours as needed for mild pain or headache.    [provider]  albuterol (PROVENTIL HFA;VENTOLIN HFA) 108 (90 BASE) MCG/ACT inhaler Inhale into the lungs every 6 (six) hours as needed for wheezing or shortness of breath.    [provider]  cefdinir (OMNICEF) 300 MG capsule Take 1 capsule (300 mg total) by mouth 2 (two) times daily. 06/27/17   Lily Kocher, PA-C  cephALEXin (KEFLEX) 500 MG capsule Take 1 capsule (500 mg total) by mouth 4 (four) times daily. 09/28/17   Milton Ferguson, MD  doxycycline (VIBRAMYCIN) 100 MG capsule Take 1 capsule (100 mg total) by mouth 2 (two) times daily. 09/26/17   Trula Slade, DPM  gabapentin (NEURONTIN) 300 MG capsule Take 900 mg by mouth 4 (four) times daily.     [provider]  HYDROcodone-acetaminophen (NORCO/VICODIN) 5-325 MG  tablet Take 1-2 tablets by mouth every 4 (four) hours as needed. 07/18/17   Aviva Signs, MD  ibuprofen (ADVIL,MOTRIN) 600 MG tablet Take 1 tablet (600 mg total) by mouth 4 (four) times daily. 06/27/17   Lily Kocher, PA-C  mupirocin ointment (BACTROBAN) 2 % Apply 1 application topically 2 (two) times daily. 09/26/17   Trula Slade, DPM  sertraline (ZOLOFT) 50 MG tablet Take 25 mg by mouth daily.     [provider]  tiZANidine (ZANAFLEX) 4 MG tablet Take 4 mg by mouth every 6 (six) hours as needed for muscle spasms.    [provider]    Family History History reviewed. No pertinent family history.  Social  History Social History  Substance Use Topics  . Smoking status: Current Every Day Smoker    Packs/day: 1.00    Years: 15.00    Types: Cigarettes  . Smokeless tobacco: Current User    Types: Snuff  . Alcohol use Yes     Comment: occasionally     Allergies   Darvocet [propoxyphene n-acetaminophen]; Tramadol; and Other   Review of Systems Review of Systems  Constitutional: Negative for appetite change and fatigue.  HENT: Negative for congestion, ear discharge and sinus pressure.   Eyes: Negative for discharge.  Respiratory: Negative for cough.   Cardiovascular: Negative for chest pain.  Gastrointestinal: Positive for abdominal pain. Negative for diarrhea.  Genitourinary: Negative for frequency and hematuria.  Musculoskeletal: Negative for back pain.       Mild pain in her right foot  Skin: Negative for rash.  Neurological: Negative for seizures and headaches.  Psychiatric/Behavioral: Negative for hallucinations.     Physical Exam Updated Vital Signs BP (!) 106/55   Pulse 79   Temp 98.3 F (36.8 C) (Oral)   Resp 16   Ht 6\' 2"  (1.88 m)   Wt (!) 145.2 kg (320 lb)   SpO2 94%   BMI 41.09 kg/m   Physical Exam  Constitutional: He is oriented to person, place, and time. He appears well-developed.  HENT:  Head: Normocephalic.  Eyes: Conjunctivae and EOM are normal. No scleral icterus.  Neck: Neck supple. No thyromegaly present.  Cardiovascular: Normal rate and regular rhythm.  Exam reveals no gallop and no friction rub.   No murmur heard. Pulmonary/Chest: No stridor. He has no wheezes. He has no rales. He exhibits no tenderness.  Abdominal: He exhibits no distension. There is no tenderness. There is no rebound.  Mild tenderness around the umbilicus with minimal redness  Musculoskeletal: Normal range of motion. He exhibits no edema.  Lymphadenopathy:    He has no cervical adenopathy.  Neurological: He is oriented to person, place, and time. He exhibits normal muscle  tone. Coordination normal.  Skin: No rash noted. No erythema.  Right foot which is very old  Psychiatric: He has a normal mood and affect. His behavior is normal.     ED Treatments / Results  Labs (all labs ordered are listed, but only abnormal results are displayed) Labs Reviewed  CBC WITH DIFFERENTIAL/PLATELET - Abnormal; Notable for the following:       Result Value   WBC 12.5 (*)    Hemoglobin 17.5 (*)    Monocytes Absolute 1.7 (*)    All other components within normal limits  COMPREHENSIVE METABOLIC PANEL  LACTIC ACID, PLASMA  LACTIC ACID, PLASMA    EKG  EKG Interpretation None       Radiology Dg Abd Acute W/chest  Result Date: 09/28/2017  CLINICAL DATA:  Umbilical bleeding.  Pain. EXAM: DG ABDOMEN ACUTE W/ 1V CHEST COMPARISON:  03/02/2015 chest radiograph. 04/30/2013 CT abdomen/pelvis. FINDINGS: Stable cardiomediastinal silhouette with normal heart size. No pneumothorax. No pleural effusion. Lungs appear clear, with no acute consolidative airspace disease and no pulmonary edema. No dilated small bowel loops. Moderate colorectal stool volume. No evidence pneumatosis or pneumoperitoneum. No radiopaque nephrolithiasis. Moderate thoracolumbar spondylosis. IMPRESSION: 1. No active disease in the chest. 2. Nonobstructive bowel gas pattern. Electronically Signed   By: Ilona Sorrel M.D.   On: 09/28/2017 16:43   Dg Foot Complete Right  Result Date: 09/28/2017 CLINICAL DATA:  Chronic right foot wound on ball foot with bandaging also between the great and second toes. EXAM: RIGHT FOOT COMPLETE - 3+ VIEW COMPARISON:  Radiographs dating back through 12/10/2014 hand CT 09/18/2016 FINDINGS: Chronic hallux valgus of the great toe with old fracture deformities and slightly tapered appearance of the second and third distal metatarsal shafts. Soft tissue thickening along the plantar aspect of the great toe without ulceration or frank bone destruction. Hammertoe deformities of the toes in  particular the second and third digits. Calcaneal enthesophytes are present along the plantar dorsal aspect. No acute fracture is identified. No suspicious osseous lesions. IMPRESSION: 1. Chronic hallux valgus with hammertoe deformities of the second and third digits. 2. Old fracture deformities of the distal second and third metatarsals. No acute osseous abnormality. 3. No radiographic evidence of osteomyelitis. 4. Calcaneal enthesopathy. Electronically Signed   By: Ashley Royalty M.D.   On: 09/28/2017 16:48   Dg Foot Complete Right  Result Date: 09/26/2017 Please see detailed radiograph report in office note.   Procedures Procedures (including critical care time)  Medications Ordered in ED Medications  cephALEXin (KEFLEX) capsule 500 mg (not administered)  ibuprofen (ADVIL,MOTRIN) tablet 800 mg (not administered)     Initial Impression / Assessment and Plan / ED Course  I have reviewed the triage vital signs and the nursing notes.  Pertinent labs & imaging results that were available during my care of the patient were reviewed by me and considered in my medical decision making (see chart for details).     Patient with possible cellulitis around his umbilicus. He'll be placed on Keflex N to see his family doctor this week  Final Clinical Impressions(s) / ED Diagnoses   Final diagnoses:  Wound infection    New Prescriptions New Prescriptions   CEPHALEXIN (KEFLEX) 500 MG CAPSULE    Take 1 capsule (500 mg total) by mouth 4 (four) times daily.     Milton Ferguson, MD 09/28/17 [phone]

## 2017-09-28 NOTE — Discharge Instructions (Signed)
Follow up with your md next week. °

## 2017-09-28 NOTE — ED Notes (Signed)
At initial triage, pt insisted he had infection all over his body and he knew this because it had happened before.  Pt could not present specific symptoms but was adamant it was from his foot.  Xrays placed per protocol, but pt refused.

## 2017-09-28 NOTE — ED Notes (Signed)
Pt states emphatically that he is not here for his foot as it is [facility] [place]  He reports that he is here for his "belly button" bleeding for the last three days   He looked on the internet and saw that he might have an infection or a hernia and came to the ED for evaluation  There is scant dried substance to his belly button

## 2017-09-28 NOTE — ED Triage Notes (Signed)
Pt reports chronic wound on right third toe he feels is infected and also having blood from belly button.

## 2017-09-30 ENCOUNTER — Encounter: Payer: Self-pay | Admitting: Emergency Medicine

## 2017-09-30 ENCOUNTER — Ambulatory Visit (INDEPENDENT_AMBULATORY_CARE_PROVIDER_SITE_OTHER): Payer: Medicaid Other | Admitting: Emergency Medicine

## 2017-09-30 DIAGNOSIS — Z23 Encounter for immunization: Secondary | ICD-10-CM | POA: Diagnosis not present

## 2017-09-30 DIAGNOSIS — R0609 Other forms of dyspnea: Secondary | ICD-10-CM | POA: Diagnosis not present

## 2017-09-30 DIAGNOSIS — Z720 Tobacco use: Secondary | ICD-10-CM

## 2017-09-30 NOTE — Patient Instructions (Signed)
We will perform pulmonary function testing Depending on your testing results we may decide to do further blood work, start inhaled medications. You need to continue to work hard on decreasing smoking. We will talk more about stopping at your next visit. Some of your symptoms are suggestive of sleep apnea. We will talk about performing a sleep test at your next visit. Follow with Dr Lamonte Sakai next available with pulmonary function testing.

## 2017-09-30 NOTE — Assessment & Plan Note (Signed)
He is interested in stopping. One of his motivations is that this will allow him to get his toe surgery. We talked about cutting down. At our next visit we will help strategize a quit date, plan to stop.

## 2017-09-30 NOTE — Assessment & Plan Note (Signed)
Consider contribution of deconditioning, obesity. I believe he needs full pulmonary function testing to sort out whether he truly has obstructive lung disease. If so we can talk about initiating bronchodilators.

## 2017-09-30 NOTE — Progress Notes (Signed)
Subjective:    Patient ID: Hunter Reyes, male    DOB: 10/11/1976, 41 y.o.   MRN: [id number]  HPI 41 yo obese man, hx of substance abuse including narcotics, cocaine. He is an active smoker (26 pack years). He has a history of osteomyelitis of the toe, followed by podiatry. Referred today for evaluation of suspected COPD. He began to noticed exertional SOB in his 30's, some tightness in his chest. Currently he experiences dyspnea with walking. He has has daily cough. Hears wheeze frequently. Has had episodic increases in sputum, treated for bronchitis. He has been on spiriva before, wasn't sure that it helped. He is using albuterol about 3 times a day- seems to help him.   His brother has noted that he snores, has a strange breathing pattern. He remains tired in the daytime, not rested. He takes something for sleep - he isn't sure the name.   His weight varies by 5 lbs every few months.   Review of Systems  Constitutional: Negative for fever and unexpected weight change.  HENT: Negative for congestion, dental problem, ear pain, nosebleeds, postnasal drip, rhinorrhea, sinus pressure, sneezing, sore throat and trouble swallowing.   Eyes: Negative for redness and itching.  Respiratory: Positive for cough, chest tightness, shortness of breath and wheezing.   Cardiovascular: Negative for palpitations and leg swelling.  Gastrointestinal: Negative for nausea and vomiting.  Genitourinary: Negative for dysuria.  Musculoskeletal: Negative for joint swelling.  Skin: Negative for rash.  Neurological: Negative for headaches.  Hematological: Does not bruise/bleed easily.  Psychiatric/Behavioral: Negative for dysphoric mood. The patient is not nervous/anxious.     Past Medical History:  Diagnosis Date  . Arthritis    hands  . Bipolar 1 disorder ([place])   . Borderline personality disorder ([place])   . COPD (chronic obstructive pulmonary disease) ([place])    denies SOB with daily activities  . DDD  (degenerative disc disease), lumbar    and thoracic  . Deliberate self-cutting    history of  . Depression   . Foot ulcer, right ([place]) 11/02/2013  . History of seizures    as a child - no seizures since before teenage years; states unknown cause  . 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 Neuropathy   . Non-restorable tooth 10/2013   multiple  . Obesity   . Osteomyelitis ([place]) 2014   right foot  . Panic attacks   . Peripheral neuropathy      No family history on file.   Social History   Social History  . Marital status: Single    Spouse name: N/A  . Number of children: N/A  . Years of education: N/A   Occupational History  . Not on file.   Social History Main Topics  . Smoking status: Current Every Day Smoker    Packs/day: 1.00    Years: 26.00    Types: Cigarettes  . Smokeless tobacco: Current User    Types: Snuff  . Alcohol use Yes     Comment: occasionally  . Drug use: No     Comment: prior use  . Sexual activity: No   Other Topics Concern  . Not on file   Social History Narrative  . No narrative on file     Allergies  Allergen Reactions  . Darvocet [Propoxyphene N-Acetaminophen] Hives and Itching  . Tramadol Hives and Itching  . Other Hives     Outpatient Medications Prior to Visit  Medication Sig Dispense Refill  . acetaminophen (TYLENOL)  500 MG tablet Take 1,000 mg by mouth every 6 (six) hours as needed for mild pain or headache.    . albuterol (PROVENTIL HFA;VENTOLIN HFA) 108 (90 BASE) MCG/ACT inhaler Inhale into the lungs every 6 (six) hours as needed for wheezing or shortness of breath.    . doxycycline (VIBRAMYCIN) 100 MG capsule Take 1 capsule (100 mg total) by mouth 2 (two) times daily. 20 capsule 0  . gabapentin (NEURONTIN) 300 MG capsule Take 900 mg by mouth 4 (four) times daily.     Marland Kitchen ibuprofen (ADVIL,MOTRIN) 600 MG tablet Take 1 tablet (600 mg total) by mouth 4 (four) times daily. 30 tablet 0  . sertraline (ZOLOFT) 50 MG tablet Take 25 mg by mouth  daily.     Marland Kitchen tiZANidine (ZANAFLEX) 4 MG tablet Take 4 mg by mouth every 6 (six) hours as needed for muscle spasms.    Marland Kitchen HYDROcodone-acetaminophen (NORCO/VICODIN) 5-325 MG tablet Take 1-2 tablets by mouth every 4 (four) hours as needed. 40 tablet 0  . cefdinir (OMNICEF) 300 MG capsule Take 1 capsule (300 mg total) by mouth 2 (two) times daily. 14 capsule 0  . cephALEXin (KEFLEX) 500 MG capsule Take 1 capsule (500 mg total) by mouth 4 (four) times daily. 28 capsule 0  . mupirocin ointment (BACTROBAN) 2 % Apply 1 application topically 2 (two) times daily. 30 g 2   No facility-administered medications prior to visit.         Objective:   Physical Exam Vitals:   09/30/17 0915 09/30/17 0916  BP:  112/80  Pulse:  80  SpO2:  99%  Weight: (!) 328 lb (148.8 kg)   Height: 6\' 2"  (1.88 m)    Gen: Pleasant, obese man, in no distress,  normal affect  ENT: No lesions,Poor dentition oropharynx clear, no postnasal drip  Neck: No JVD, no Stridor  Lungs: No use of accessory muscles, Clear bilaterally, no wheezing or crackles  Cardiovascular: RRR, heart sounds normal, no murmur or gallops, no peripheral edema  Musculoskeletal: Right foot is in a soft boot  Neuro: alert, non focal, somewhat delayed intelect  Skin: Warm, no lesions or rashes      Assessment & Plan:  Tobacco abuse He is interested in stopping. One of his motivations is that this will allow him to get his toe surgery. We talked about cutting down. At our next visit we will help strategize a quit date, plan to stop.  Dyspnea on exertion Consider contribution of deconditioning, obesity. I believe he needs full pulmonary function testing to sort out whether he truly has obstructive lung disease. If so we can talk about initiating bronchodilators.  Baltazar Apo, MD, PhD 09/30/2017, 9:39 AM [facility] [phone] or if no answer [phone]

## 2017-10-03 NOTE — Progress Notes (Signed)
Patient ID: Hunter Reyes, male   DOB: [birth year], 41 y.o.   MRN: [id number]  Subjective: Hunter Reyes is a 41 y.o. is seen today for concerns of painful calluses to his right foot as well as thick, painful, elongated toenails that he cannot trim himself. Over the last several weeks she has noticed that the right third toes become red. Unfortunately he could not come to his appointment because he was in jail. He denies any drainage or pus coming from the toe denies any red streaks. Denies any systemic complaints such as fevers, chills, nausea, vomiting. No calf pain, chest pain, shortness of breath.   Objective: General: No acute distress, AAOx3  DP/PT pulses palpable 2/4, CRT < 3 sec to all digits.  Motor function intact. Multiple digital deformity is present bilaterally with prominent metatarsal heads on the right foot submetatarsal 1and 5. 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 Hyperkeratotic lesions right foot submetatarsal 1and 5. No underlying ulceration, drainage or other signs of infection. Pre-ulcerative lesion left foot submetatarsal 2. No underlying ulceration identified at this time. There is no signs of infection.  There is erythema to the right third toe and is a wound to the dorsal aspect at the PIPJ. The wound does probe very close to bone. There is no drainage or pus there is no ascending cellulitis. There is no fluctuance, crepitus, malodor. Nails are hypertrophic, dystrophic, brittle, discolored, elongated 9. Previous left third toe is been dictated. No pain with calf compression, swelling, warmth, erythema.   Assessment and Plan:  Digital contractures with pre-ulcerative lesions; hyperkeratotic lesions; right third toe cellulitis with wound   -Treatment options discussed including all alternatives, risks, and complications -Hyperkeratotic lesions were debrided 3 without complications or bleeding. This time there is no underlying ulceration or signs of  infection. Continue to monitor.  -Nail sharply debrided 9 without any complications or bleeding -Right third toe wound sharply debrided today without any complications to granular tissue. X-rays were obtained and reviewed which not reveal any definitive evidence of acute osteomyelitis although given his digital deformity is difficult to evaluate this. There is no soft tissue emphysema that I can appreciate. Rx doxycycline. Antibiotic ointment dressing changes daily. If there is any worsening he is to go to the ER. Discussed amputation of the toe and this is a high likelihood given the appearance today.  -He was asking for pain medicine but he does go to pain management so I did NOT give this to him. -Follow up in 10 days or sooner if any issues are to arise.  -Continue with supportive shoes and offloading.  -Monitor for any clinical signs or symptoms of infection and directed to call the office immediately should any occur or go to the ER. -RTC as scheduled or sooner if needed.   Celesta Gentile, DPM

## 2017-10-10 ENCOUNTER — Encounter: Payer: Self-pay | Admitting: Podiatry

## 2017-10-10 ENCOUNTER — Ambulatory Visit (INDEPENDENT_AMBULATORY_CARE_PROVIDER_SITE_OTHER): Payer: Medicaid Other | Admitting: Podiatry

## 2017-10-10 VITALS — BP 126/79 | HR 70 | Resp 16

## 2017-10-10 DIAGNOSIS — L97512 Non-pressure chronic ulcer of other part of right foot with fat layer exposed: Secondary | ICD-10-CM

## 2017-10-13 NOTE — Progress Notes (Signed)
Patient ID: Hunter Reyes, male   DOB: Mar 15, 1976, 41 y.o.   MRN: [id number]  Subjective: Hunter Reyes is a 41 y.o. is seen today for follow up evaluation of cellulitis the right third toe. He states this is doing well and the redness has resolved he denies any open sore any drainage. He states he has a thick callus on the right foot pointing to the submetatarsal 4 area. Denies any drainage or pus or any redness coming from this area. He has no other concerns today. Denies any systemic complaints such as fevers, chills, nausea, vomiting. No calf pain, chest pain, shortness of breath.   Objective: General: No acute distress, AAOx3  DP/PT pulses palpable 2/4, CRT < 3 sec to all digits.  Motor function intact. Multiple digital deformity is present bilaterally with prominent metatarsal heads on the right foot submetatarsal 1and 5. 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 Hyperkeratotic lesion submetatarsal 4/5 area on the right foot. Upon irritated there is a central ulceration measuring 1.2 x 1 x 0.4 cm. There is no probing to bone, undermining or tunneling. There is no surrounding erythema, ascending cellulitis. There is no fractures or crepitus. There is no malodor. No clinical signs of infection present. The erythema, edema to the right third toe is resolved. There is no open sores identified the toe. No other open lesions or pre-ulcerative lesions identified today.  No pain with calf compression, swelling, warmth, erythema.   Assessment and Plan:  New ulceration right foot with resolved cellulitis the right third toe  -Treatment options discussed including all alternatives, risks, and complications -Cellulitis of the right third toe is resolved. Continue to monitor for recurrence -Hyperkeratotic lesion, pain was sharp debrided to healthy granular tissue. Continue this moment antibiotic ointment dressing changes to the area daily. Offloading. Continue with surgical shoe which he has  at home. Monitor for any clinical signs or symptoms of infection and directed to call the office immediately should any occur or go to the ER. There is no signs of infections laser and hold off on any further oral antibiotics. -Monitor for any clinical signs or symptoms of infection and directed to call the office immediately should any occur or go to the ER. -RTC as scheduled or sooner if needed.   Celesta Gentile, DPM

## 2017-10-18 ENCOUNTER — Ambulatory Visit: Payer: Medicaid Other | Admitting: Emergency Medicine

## 2017-10-24 ENCOUNTER — Ambulatory Visit: Payer: Medicaid Other | Admitting: Emergency Medicine

## 2017-10-24 ENCOUNTER — Ambulatory Visit: Payer: Medicaid Other | Admitting: Podiatry

## 2017-10-29 ENCOUNTER — Ambulatory Visit: Payer: Medicaid Other | Admitting: Podiatry

## 2017-10-29 DIAGNOSIS — L97512 Non-pressure chronic ulcer of other part of right foot with fat layer exposed: Secondary | ICD-10-CM

## 2017-10-29 NOTE — Progress Notes (Signed)
Patient ID: Hunter Reyes, male   DOB: [birth year], 41 y.o.   MRN: [id number]  Subjective: Hunter Reyes is a 41 y.o. is seen today for follow up evaluation of a wound to the right foot. He states that he's been using antibiotic ointment dressing changes to the wound daily. He feels that the wound is getting better and he denies any drainage or pus coming from the area. He denies any increase in swelling or redness to his feet. He states the left foot is doing well. He has no other concerns today. Denies any systemic complaints such as fevers, chills, nausea, vomiting. No calf pain, chest pain, shortness of breath.   Objective: General: No acute distress, AAOx3  DP/PT pulses palpable 2/4, CRT < 3 sec to all digits.  Motor function intact. Multiple digital deformity is present bilaterally with prominent metatarsal heads on the right foot submetatarsal 1and 5. 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 Hyperkeratotic lesion submetatarsal 4/5 area on the right foot. Upon debrided to the areas pre-ulcerative there is only a small superficial wound present. There is no swelling erythema, ascending cellulitis. There is no fluctuance or crepitus and there is no malodor. The dorsal aspect of the right third toe at the PIPJ small hyperkeratotic lesion without any underlying ulceration drainage or any signs of infection. There is no increase in edema to the right foot there is no erythema or increase in warmth. There is no other open lesions or pre-ulcerative lesions identified today. No pain with calf compression, swelling, warmth, erythema.   Assessment and Plan:  Healing ulceration right foot  -Treatment options discussed including all alternatives, risks, and complications -Hyperkeratotic lesion, wound to the right foot was sharply debrided without any complications to reveal only a small superficial wound present. Continue in about ointment dressing changes. Also debrided the small hyperkeratotic  lesion to the right third toe without any, occasions or bleeding. Continue offloading pads at all times. There is no signs of infection today; hold off on any further oral antibiotics. However continue to monitor closely. -Follow-up in 2 weeks or sooner if needed.  Celesta Gentile, DPM

## 2017-10-31 ENCOUNTER — Telehealth: Payer: Self-pay | Admitting: Podiatry

## 2017-10-31 NOTE — Telephone Encounter (Signed)
Left message to call again. 

## 2017-10-31 NOTE — Telephone Encounter (Signed)
Hello, this message is for [facility]. If you could please give me a call back at [phone]. Thank you very much.

## 2017-11-01 NOTE — Telephone Encounter (Signed)
Left message informing pt Dr. Jacqualyn Posey would not order pain medications, pt needed to got to Pain management.

## 2017-11-01 NOTE — Telephone Encounter (Signed)
Pt states he is having a lot of pain and the pain mgmt was getting Suboxone. Pt stated the Suboxone is making him sick and the pain center has not changed and he hasn't been in 2 months because they are not helping.

## 2017-11-01 NOTE — Telephone Encounter (Signed)
Since he is going to pain management he needs to be seen by them and I cannot prescribe pain medication. He was doing well at his last appointment.

## 2017-11-12 ENCOUNTER — Ambulatory Visit: Payer: Medicaid Other | Admitting: Podiatry

## 2017-11-12 DIAGNOSIS — L97512 Non-pressure chronic ulcer of other part of right foot with fat layer exposed: Secondary | ICD-10-CM | POA: Diagnosis not present

## 2017-11-12 DIAGNOSIS — G629 Polyneuropathy, unspecified: Secondary | ICD-10-CM

## 2017-11-12 MED ORDER — GABAPENTIN 300 MG PO CAPS: 600.0000 mg | ORAL_CAPSULE | Freq: Three times a day (TID) | ORAL | 3 refills | Status: DC

## 2017-11-12 NOTE — Patient Instructions (Signed)
Gabapentin capsules or tablets What is this medicine? GABAPENTIN (GA ba pen tin) is used to control partial seizures in adults with epilepsy. It is also used to treat certain types of nerve pain. This medicine may be used for other purposes; ask your health care provider or pharmacist if you have questions. COMMON BRAND NAME(S): Active-PAC with Gabapentin, Gabarone, Neurontin What should I tell my health care provider before I take this medicine? They need to know if you have any of these conditions: -kidney disease -suicidal thoughts, plans, or attempt; a previous suicide attempt by you or a family member -an unusual or allergic reaction to gabapentin, other medicines, foods, dyes, or preservatives -pregnant or trying to get pregnant -breast-feeding How should I use this medicine? Take this medicine by mouth with a glass of water. Follow the directions on the prescription label. You can take it with or without food. If it upsets your stomach, take it with food.Take your medicine at regular intervals. Do not take it more often than directed. Do not stop taking except on your doctor's advice. If you are directed to break the 600 or 800 mg tablets in half as part of your dose, the extra half tablet should be used for the next dose. If you have not used the extra half tablet within 28 days, it should be thrown away. A special MedGuide will be given to you by the pharmacist with each prescription and refill. Be sure to read this information carefully each time. Talk to your pediatrician regarding the use of this medicine in children. Special care may be needed. Overdosage: If you think you have taken too much of this medicine contact a poison control center or emergency room at once. NOTE: This medicine is only for you. Do not share this medicine with others. What if I miss a dose? If you miss a dose, take it as soon as you can. If it is almost time for your next dose, take only that dose. Do not  take double or extra doses. What may interact with this medicine? Do not take this medicine with any of the following medications: -other gabapentin products This medicine may also interact with the following medications: -alcohol -antacids -antihistamines for allergy, cough and cold -certain medicines for anxiety or sleep -certain medicines for depression or psychotic disturbances -homatropine; hydrocodone -naproxen -narcotic medicines (opiates) for pain -phenothiazines like chlorpromazine, mesoridazine, prochlorperazine, thioridazine This list may not describe all possible interactions. Give your health care provider a list of all the medicines, herbs, non-prescription drugs, or dietary supplements you use. Also tell them if you smoke, drink alcohol, or use illegal drugs. Some items may interact with your medicine. What should I watch for while using this medicine? Visit your doctor or health care professional for regular checks on your progress. You may want to keep a record at home of how you feel your condition is responding to treatment. You may want to share this information with your doctor or health care professional at each visit. You should contact your doctor or health care professional if your seizures get worse or if you have any new types of seizures. Do not stop taking this medicine or any of your seizure medicines unless instructed by your doctor or health care professional. Stopping your medicine suddenly can increase your seizures or their severity. Wear a medical identification bracelet or chain if you are taking this medicine for seizures, and carry a card that lists all your medications. You may get drowsy, dizzy,   or have blurred vision. Do not drive, use machinery, or do anything that needs mental alertness until you know how this medicine affects you. To reduce dizzy or fainting spells, do not sit or stand up quickly, especially if you are an older patient. Alcohol can  increase drowsiness and dizziness. Avoid alcoholic drinks. Your mouth may get dry. Chewing sugarless gum or sucking hard candy, and drinking plenty of water will help. The use of this medicine may increase the chance of suicidal thoughts or actions. Pay special attention to how you are responding while on this medicine. Any worsening of mood, or thoughts of suicide or dying should be reported to your health care professional right away. Women who become pregnant while using this medicine may enroll in the [place] American Antiepileptic Drug Pregnancy Registry by calling 1-888-233-2334. This registry collects information about the safety of antiepileptic drug use during pregnancy. What side effects may I notice from receiving this medicine? Side effects that you should report to your doctor or health care professional as soon as possible: -allergic reactions like skin rash, itching or hives, swelling of the face, lips, or tongue -worsening of mood, thoughts or actions of suicide or dying Side effects that usually do not require medical attention (report to your doctor or health care professional if they continue or are bothersome): -constipation -difficulty walking or controlling muscle movements -dizziness -nausea -slurred speech -tiredness -tremors -weight gain This list may not describe all possible side effects. Call your doctor for medical advice about side effects. You may report side effects to FDA at 1-800-FDA-1088. Where should I keep my medicine? Keep out of reach of children. This medicine may cause accidental overdose and death if it taken by other adults, children, or pets. Mix any unused medicine with a substance like cat litter or coffee grounds. Then throw the medicine away in a sealed container like a sealed bag or a coffee can with a lid. Do not use the medicine after the expiration date. Store at room temperature between 15 and 30 degrees C (59 and 86 degrees F). NOTE: This  sheet is a summary. It may not cover all possible information. If you have questions about this medicine, talk to your doctor, pharmacist, or health care provider.  2018 Elsevier/Gold Standard (2014-02-05 15:26:50)  

## 2017-11-13 NOTE — Progress Notes (Signed)
Patient ID: ETHRIDGE SOLLENBERGER, male   DOB: 01-02-1976, 41 y.o.   MRN: [id number]  Subjective: Hunter Reyes is a 41 y.o. is seen today for follow up evaluation of a wound to the right foot. He states that overall the area is doing better but there is a soft spot that he has noticed along the area of the callus, wound.  He denies any drainage or pus coming from the area denies any increase in redness or swelling.  He is asking for refill of his gabapentin today.  He was previously on 900 mg 4 times a day but he has not taken the last several months.  He states the nerve pain to his feet is getting worse.  He has no other concerns today.  Denies any systemic complaints such as fevers, chills, nausea, vomiting. No calf pain, chest pain, shortness of breath.   Objective: General: No acute distress, AAOx3  DP/PT pulses palpable 2/4, CRT < 3 sec to all digits.  Motor function intact. Multiple digital deformity is present bilaterally with prominent metatarsal heads on the right foot submetatarsal 1and 5. 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 Hyperkeratotic lesion right foot some metatarsal 4/5 area which is pre-ulcerative.  Upon debridement there is still residual wound present submetatarsal 4.  There is no probing, undermining or tunneling.  There is no surrounding erythema, ascending cellulitis, drainage or pus.  There is no other open lesions or pre-ulcerative lesions identified today.  There is no sign of edema, erythema, increase in warmth bilaterally. No pain with calf compression, swelling, warmth, erythema.   Assessment and Plan:  Healing ulceration right foot, no signs of infection; neuropathy  -Treatment options discussed including all alternatives, risks, and complications -The wound, hyperkeratotic lesion was sharply debrided today without any complications or bleeding with a scalpel to healthy, graunlar tissue without complications and he tolerated it well.  Continue with antibiotic  ointment dressing changes daily and offloading at all times.  Continue with surgical shoe.  Monitor for any signs or symptoms of infection. -Prescribed gabapentin today.  Will go back to starting with 600 mg 3 times a day as this was his dose prior to increasing.  Monitor for any side effects.  Can titrate up as needed. -Follow-up as scheduled or sooner if needed.  Trula Slade DPM

## 2017-11-26 ENCOUNTER — Ambulatory Visit: Payer: Medicaid Other | Admitting: Podiatry

## 2017-12-19 DIAGNOSIS — M79676 Pain in unspecified toe(s): Secondary | ICD-10-CM

## 2020-08-17 ENCOUNTER — Telehealth: Payer: Self-pay

## 2020-08-17 NOTE — Telephone Encounter (Signed)
Pt is having trouble with right foot would like a call from Dr. Jacqualyn Posey.

## 2020-08-19 NOTE — Telephone Encounter (Signed)
I attempted to call patient but no answer. Left VM to call back  Hunter Reyes- if he is having issues he needs to come in to be seen. Can you schedule him an appointment?

## 2020-09-06 NOTE — Telephone Encounter (Signed)
I have called patient again to get him scheduled for an appointment. No answer.

## 2020-09-06 NOTE — Telephone Encounter (Signed)
Thanks. Can you send him a letter or card that we have been trying to reach him? Thanks.

## 2020-09-20 NOTE — Telephone Encounter (Signed)
Finally got in touch with him. Scheduled 10/7

## 2020-09-29 ENCOUNTER — Ambulatory Visit: Payer: Medicaid Other | Admitting: Podiatry

## 2022-11-25 ENCOUNTER — Emergency Department (HOSPITAL_COMMUNITY): Payer: Medicaid Other

## 2022-11-25 ENCOUNTER — Emergency Department (HOSPITAL_COMMUNITY)
Admission: EM | Admit: 2022-11-25 | Discharge: 2022-11-25 | Disposition: A | Discharge status: Home / Self Care | Payer: Medicaid Other | Attending: Emergency Medicine | Admitting: Emergency Medicine

## 2022-11-25 ENCOUNTER — Encounter (HOSPITAL_COMMUNITY): Payer: Self-pay | Admitting: Emergency Medicine

## 2022-11-25 DIAGNOSIS — R339 Retention of urine, unspecified: Secondary | ICD-10-CM | POA: Diagnosis not present

## 2022-11-25 DIAGNOSIS — R109 Unspecified abdominal pain: Secondary | ICD-10-CM | POA: Diagnosis not present

## 2022-11-25 DIAGNOSIS — F17210 Nicotine dependence, cigarettes, uncomplicated: Secondary | ICD-10-CM | POA: Diagnosis not present

## 2022-11-25 DIAGNOSIS — J449 Chronic obstructive pulmonary disease, unspecified: Secondary | ICD-10-CM | POA: Insufficient documentation

## 2022-11-25 DIAGNOSIS — R300 Dysuria: Secondary | ICD-10-CM | POA: Insufficient documentation

## 2022-11-25 LAB — URINALYSIS, ROUTINE W REFLEX MICROSCOPIC
Bacteria, UA: NONE SEEN
Bilirubin Urine: NEGATIVE
Glucose, UA: NEGATIVE mg/dL
Ketones, ur: NEGATIVE mg/dL
Nitrite: NEGATIVE
Protein, ur: 100 mg/dL — AB
Specific Gravity, Urine: 1.018 (ref 1.005–1.030)
WBC, UA: >50 WBC/hpf — ABNORMAL HIGH (ref 0–5)
pH: 7 (ref 5.0–8.0)

## 2022-11-25 MED ORDER — CEPHALEXIN 500 MG PO CAPS: 500.0000 mg | ORAL_CAPSULE | Freq: Three times a day (TID) | ORAL | 0 refills | Status: DC

## 2022-11-25 NOTE — Discharge Instructions (Signed)
You were evaluated in the Emergency Department and after careful evaluation, we did not find any emergent condition requiring admission or further testing in the hospital.  Your current symptoms are likely due to a urinary tract infection.  Your CT scan is showing that you likely have some prostate issues making it hard for you to urinate.  It is very important that you follow-up with a primary care doctor and a urologist to get a closer look at your kidneys and prostate.  Call the office number provided to schedule an appointment.  Take the Keflex antibiotic as directed to clear the infection.  Please return to the Emergency Department if you experience any worsening of your condition.  Thank you for allowing [place] to be a part of your care.

## 2022-11-25 NOTE — ED Provider Notes (Signed)
[facility] Emergency Department Provider Note MRN:  [id number]  Arrival date & time: 11/25/22     Chief Complaint   Urinary Retention   History of Present Illness   Hunter Reyes is a 46 y.o. year-old male with a history of COPD, bipolar disorder presenting to the ED with chief complaint of dysuria.  Straining with urination for the past 6 months, pain with urination for the past few days.  No fever, no abdominal pain, no flank pain, no other complaints.  Review of Systems  A thorough review of systems was obtained and all systems are negative except as noted in the HPI and PMH.   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[place])    Neuropathy    Non-restorable tooth 10/2013   multiple   Obesity    Osteomyelitis ([place]) 2014   right foot   Panic attacks    Peripheral neuropathy     Past Surgical History:  Procedure Laterality Date   AMPUTATION     left middle toe   BREAST BIOPSY Right 07/12/2017   Procedure: BREAST BIOPSY;  Surgeon: Aviva Signs, MD;  Location: [facility];  Service: General;  Laterality: Right;   INNER EAR SURGERY Left    as a child   MULTIPLE EXTRACTIONS WITH ALVEOLOPLASTY N/A 11/17/2013   Procedure: MULTIPLE EXTRACTION WITH ALVEOLOPLASTY;  Surgeon: Gae Bon, DDS;  Location: [facility];  Service: Oral Surgery;  Laterality: N/A;    History reviewed. No pertinent family history.  Social History   Socioeconomic History   Marital status: Single    Spouse  name: Not on file   Number of children: Not on file   Years of education: Not on file   Highest education level: Not on file  Occupational History   Not on file  Tobacco Use   Smoking status: Every Day    Packs/day: 1.00    Years: 26.00    Total pack years: 26.00    Types: Cigarettes   Smokeless tobacco: Current    Types: Snuff  Vaping Use   Vaping Use: Never used  Substance and Sexual Activity   Alcohol use: Yes    Comment: occasionally   Drug use: No    Types: Cocaine, Marijuana    Comment: prior use   Sexual activity: Never    Birth control/protection: None  Other Topics Concern   Not on file  Social History Narrative   Not on file   Social Determinants of Health   Financial Resource Strain: Not on file  Food Insecurity: Not on file  Transportation Needs: Not on file  Physical Activity: Not on file  Stress: Not on file  Social Connections: Not on file  Intimate Partner Violence: Not on file     Physical Exam   Vitals:   11/25/22 0536  BP: 139/84  Pulse: 94  Resp: 20  Temp: 98.4 F (36.9 C)  SpO2: 95%    CONSTITUTIONAL: Well-appearing, NAD NEURO/PSYCH:  Alert and oriented x 3, no focal deficits EYES:  eyes equal and reactive ENT/NECK:  no LAD, no JVD CARDIO: Regular rate, well-perfused, normal S1 and S2 PULM:  CTAB no wheezing or rhonchi GI/GU:  non-distended, non-tender MSK/SPINE:  No gross deformities, no edema SKIN:  no rash, atraumatic   *Additional and/or pertinent findings included in MDM below  Diagnostic and Interventional Summary    EKG Interpretation  Date/Time:    Ventricular Rate:    PR Interval:    QRS Duration:   QT Interval:    QTC Calculation:   R Axis:     Text Interpretation:         Labs Reviewed  URINALYSIS, ROUTINE W REFLEX MICROSCOPIC    CT ABDOMEN PELVIS WO CONTRAST  Final Result      Medications - No data to display   Procedures  /  Critical Care Procedures  ED Course and Medical Decision Making   Initial Impression and Ddx Straining to urinate for a few months, now with pain with urination, a bit odd for him to be having straining for this amount of time, a bit too young to have enlarged prostate, will obtain CT to exclude evidence of neoplasm or obstruction, UTI is also considered.  Past medical/surgical history that increases complexity of ED encounter: None  Interpretation of Diagnostics CT revealing evidence of chronic bladder outlet obstruction, likely due to an enlarged prostate.  Patient Reassessment and Ultimate Disposition/Management     Overall suspect UTI related to poor bladder emptying, which appears to be a chronic issue.  Providing antibiotics, strongly encouraged primary care and urology follow-up to evaluate kidney function, prostate etc.  Patient management required discussion with the following services or consulting groups:  None  Complexity of Problems Addressed Acute illness or injury that poses threat of life of bodily function  Additional Data Reviewed and Analyzed Further history obtained from: None  Additional Factors Impacting ED Encounter Risk Prescriptions  Tayari Yankee. Sedonia Small, [place] [email or web address]  Final Clinical Impressions(s) / ED Diagnoses     ICD-10-CM   1. Dysuria  R30.0       ED Discharge Orders          Ordered    cephALEXin (KEFLEX) 500 MG capsule  3 times daily        11/25/22 0718             Discharge Instructions Discussed with and Provided to Patient:     Discharge Instructions      You were evaluated in the Emergency Department and after careful evaluation, we did not find any emergent condition requiring admission or further testing in the hospital.  Your current symptoms are likely due to a urinary tract infection.  Your CT scan is showing that you likely have some prostate issues making it hard for you to urinate.  It is very important that you  follow-up with a primary care doctor and a urologist to get a closer look at your kidneys and prostate.  Call the office number provided to schedule an appointment.  Take the Keflex antibiotic as directed to clear the infection.  Please return to the Emergency Department if you experience any worsening of your condition.  Thank you for allowing [place] to be a part of your care.        Maudie Flakes, MD 11/25/22 [phone]

## 2022-11-25 NOTE — ED Triage Notes (Signed)
Pt c/o difficulty urinating for the past 6 months. Pt c/o increased pain due to urinary retention for the past hr. Pt able to urinate on arrival.

## 2022-11-28 LAB — URINE CULTURE: Culture: >=100000 — AB

## 2022-11-29 LAB — URINE CULTURE

## 2022-11-30 ENCOUNTER — Telehealth (HOSPITAL_BASED_OUTPATIENT_CLINIC_OR_DEPARTMENT_OTHER): Payer: Self-pay | Admitting: *Deleted

## 2022-11-30 NOTE — Progress Notes (Signed)
ED Antimicrobial Stewardship Positive Culture Follow Up   Hunter Reyes is an 46 y.o. male who presented to [facility] on 11/25/2022 with a chief complaint of  Chief Complaint  Patient presents with   Urinary Retention    Recent Results (from the past 720 hour(s))  Urine Culture     Status: Abnormal   Collection Time: 11/25/22  5:45 AM   Specimen: Urine, Clean Catch  Result Value Ref Range Status   Specimen Description   Final    URINE, CLEAN CATCH Performed at [facility], [address] [place], [place] [address]    Special Requests   Final    NONE Performed at [facility], [address] [place], [place] [address]    Culture (A)  Final    >=100,000 COLONIES/mL METHICILLIN RESISTANT STAPHYLOCOCCUS AUREUS >=100,000 COLONIES/mL STAPHYLOCOCCUS EPIDERMIDIS    Report Status 11/29/2022 FINAL  Final   Organism ID, Bacteria METHICILLIN RESISTANT STAPHYLOCOCCUS AUREUS (A)  Final   Organism ID, Bacteria STAPHYLOCOCCUS EPIDERMIDIS (A)  Final      Susceptibility   Methicillin resistant staphylococcus aureus - MIC*    CIPROFLOXACIN >=8 RESISTANT Resistant     GENTAMICIN <=0.5 SENSITIVE Sensitive     NITROFURANTOIN <=16 SENSITIVE Sensitive     OXACILLIN >=4 RESISTANT Resistant     TETRACYCLINE <=1 SENSITIVE Sensitive     VANCOMYCIN 1 SENSITIVE Sensitive     TRIMETH/SULFA <=10 SENSITIVE Sensitive     CLINDAMYCIN <=0.25 SENSITIVE Sensitive     RIFAMPIN <=0.5 SENSITIVE Sensitive     Inducible Clindamycin NEGATIVE Sensitive     * >=100,000 COLONIES/mL METHICILLIN RESISTANT STAPHYLOCOCCUS AUREUS   Staphylococcus epidermidis - MIC*    CIPROFLOXACIN >=8 RESISTANT Resistant     GENTAMICIN <=0.5 SENSITIVE Sensitive     NITROFURANTOIN <=16 SENSITIVE Sensitive     OXACILLIN >=4 RESISTANT Resistant     TETRACYCLINE <=1 SENSITIVE Sensitive     VANCOMYCIN <=0.5 SENSITIVE Sensitive     TRIMETH/SULFA <=10 SENSITIVE Sensitive     CLINDAMYCIN <=0.25 SENSITIVE Sensitive     RIFAMPIN <=0.5  SENSITIVE Sensitive     Inducible Clindamycin NEGATIVE Sensitive     * >=100,000 COLONIES/mL STAPHYLOCOCCUS EPIDERMIDIS    [x]  Treated with Keflex, organism resistant to prescribed antimicrobial  New antibiotic prescription: Stop Keflex, start Bactrim DS 1 tablet PO BID x 7 days.   ED Provider: [name], MD    Esmeralda Arthur, PharmD, BCCCP  11/30/2022, 10:17 AM Clinical Pharmacist Monday - Friday phone -  [phone] Saturday - Sunday phone - [phone]

## 2022-11-30 NOTE — Telephone Encounter (Signed)
Post ED Visit - Positive Culture Follow-up: Unsuccessful Patient Follow-up  Culture assessed and recommendations reviewed by:  []  Elenor Quinones, Pharm.D. []  Heide Guile, Pharm.D., BCPS AQ-ID []  Parks Neptune, Pharm.D., BCPS []  Alycia Rossetti, Pharm.D., BCPS []  [place], Pharm.D., BCPS, AAHIVP []  Legrand Como, Pharm.D., BCPS, AAHIVP []  Wynell Balloon, PharmD []  Vincenza Hews, PharmD, BCPS  Positive urine culture  []  Patient discharged without antimicrobial prescription and treatment is now indicated [x]  Organism is resistant to prescribed ED discharge antimicrobial []  Patient with positive blood cultures  Plan:  Stop Keflex, Start Bactrim DS 1 tab PO BID x 7 days, [name], MD  Unable to contact patient after 3 attempts, letter will be sent to address on file  [name] 11/30/2022, 10:31 AM

## 2022-12-02 ENCOUNTER — Inpatient Hospital Stay (HOSPITAL_COMMUNITY)
Admission: EM | Admit: 2022-12-02 | Discharge: 2023-02-03 | DRG: 981 | Disposition: A | Discharge status: Home Health | Payer: Medicaid Other | Attending: Internal Medicine | Admitting: Internal Medicine

## 2022-12-02 ENCOUNTER — Encounter (HOSPITAL_COMMUNITY): Payer: Self-pay | Admitting: Emergency Medicine

## 2022-12-02 ENCOUNTER — Emergency Department (HOSPITAL_COMMUNITY): Payer: Medicaid Other

## 2022-12-02 ENCOUNTER — Other Ambulatory Visit: Payer: Self-pay

## 2022-12-02 DIAGNOSIS — D75839 Thrombocytosis, unspecified: Secondary | ICD-10-CM | POA: Diagnosis present

## 2022-12-02 DIAGNOSIS — G062 Extradural and subdural abscess, unspecified: Secondary | ICD-10-CM

## 2022-12-02 DIAGNOSIS — G894 Chronic pain syndrome: Secondary | ICD-10-CM | POA: Diagnosis present

## 2022-12-02 DIAGNOSIS — L97429 Non-pressure chronic ulcer of left heel and midfoot with unspecified severity: Secondary | ICD-10-CM | POA: Diagnosis present

## 2022-12-02 DIAGNOSIS — M19042 Primary osteoarthritis, left hand: Secondary | ICD-10-CM | POA: Diagnosis present

## 2022-12-02 DIAGNOSIS — G9341 Metabolic encephalopathy: Secondary | ICD-10-CM | POA: Diagnosis present

## 2022-12-02 DIAGNOSIS — D649 Anemia, unspecified: Secondary | ICD-10-CM

## 2022-12-02 DIAGNOSIS — E870 Hyperosmolality and hypernatremia: Secondary | ICD-10-CM | POA: Diagnosis not present

## 2022-12-02 DIAGNOSIS — F319 Bipolar disorder, unspecified: Secondary | ICD-10-CM | POA: Diagnosis present

## 2022-12-02 DIAGNOSIS — E441 Mild protein-calorie malnutrition: Secondary | ICD-10-CM | POA: Diagnosis present

## 2022-12-02 DIAGNOSIS — T39395A Adverse effect of other nonsteroidal anti-inflammatory drugs [NSAID], initial encounter: Secondary | ICD-10-CM | POA: Diagnosis present

## 2022-12-02 DIAGNOSIS — E875 Hyperkalemia: Secondary | ICD-10-CM

## 2022-12-02 DIAGNOSIS — M4649 Discitis, unspecified, multiple sites in spine: Secondary | ICD-10-CM | POA: Diagnosis present

## 2022-12-02 DIAGNOSIS — J449 Chronic obstructive pulmonary disease, unspecified: Secondary | ICD-10-CM | POA: Diagnosis present

## 2022-12-02 DIAGNOSIS — N179 Acute kidney failure, unspecified: Secondary | ICD-10-CM | POA: Diagnosis present

## 2022-12-02 DIAGNOSIS — R7881 Bacteremia: Secondary | ICD-10-CM | POA: Diagnosis present

## 2022-12-02 DIAGNOSIS — E872 Acidosis, unspecified: Secondary | ICD-10-CM | POA: Diagnosis present

## 2022-12-02 DIAGNOSIS — G629 Polyneuropathy, unspecified: Secondary | ICD-10-CM

## 2022-12-02 DIAGNOSIS — Z91119 Patient's noncompliance with dietary regimen due to unspecified reason: Secondary | ICD-10-CM

## 2022-12-02 DIAGNOSIS — K029 Dental caries, unspecified: Secondary | ICD-10-CM | POA: Diagnosis present

## 2022-12-02 DIAGNOSIS — B9562 Methicillin resistant Staphylococcus aureus infection as the cause of diseases classified elsewhere: Secondary | ICD-10-CM | POA: Diagnosis present

## 2022-12-02 DIAGNOSIS — N1330 Unspecified hydronephrosis: Secondary | ICD-10-CM | POA: Diagnosis present

## 2022-12-02 DIAGNOSIS — M659 Synovitis and tenosynovitis, unspecified: Secondary | ICD-10-CM | POA: Diagnosis present

## 2022-12-02 DIAGNOSIS — B181 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: Secondary | ICD-10-CM | POA: Diagnosis present

## 2022-12-02 DIAGNOSIS — F1121 Opioid dependence, in remission: Secondary | ICD-10-CM

## 2022-12-02 DIAGNOSIS — E878 Other disorders of electrolyte and fluid balance, not elsewhere classified: Secondary | ICD-10-CM | POA: Diagnosis not present

## 2022-12-02 DIAGNOSIS — E876 Hypokalemia: Secondary | ICD-10-CM | POA: Diagnosis not present

## 2022-12-02 DIAGNOSIS — Z79899 Other long term (current) drug therapy: Secondary | ICD-10-CM

## 2022-12-02 DIAGNOSIS — N185 Chronic kidney disease, stage 5: Secondary | ICD-10-CM | POA: Diagnosis present

## 2022-12-02 DIAGNOSIS — F17210 Nicotine dependence, cigarettes, uncomplicated: Secondary | ICD-10-CM | POA: Diagnosis present

## 2022-12-02 DIAGNOSIS — Z91199 Patient's noncompliance with other medical treatment and regimen due to unspecified reason: Secondary | ICD-10-CM

## 2022-12-02 DIAGNOSIS — E871 Hypo-osmolality and hyponatremia: Secondary | ICD-10-CM | POA: Diagnosis present

## 2022-12-02 DIAGNOSIS — R040 Epistaxis: Secondary | ICD-10-CM | POA: Diagnosis not present

## 2022-12-02 DIAGNOSIS — F603 Borderline personality disorder: Secondary | ICD-10-CM | POA: Diagnosis present

## 2022-12-02 DIAGNOSIS — G834 Cauda equina syndrome: Secondary | ICD-10-CM | POA: Diagnosis present

## 2022-12-02 DIAGNOSIS — Z6841 Body Mass Index (BMI) 40.0 and over, adult: Secondary | ICD-10-CM

## 2022-12-02 DIAGNOSIS — E860 Dehydration: Secondary | ICD-10-CM | POA: Diagnosis present

## 2022-12-02 DIAGNOSIS — D509 Iron deficiency anemia, unspecified: Secondary | ICD-10-CM

## 2022-12-02 DIAGNOSIS — F1910 Other psychoactive substance abuse, uncomplicated: Secondary | ICD-10-CM | POA: Diagnosis present

## 2022-12-02 DIAGNOSIS — M4626 Osteomyelitis of vertebra, lumbar region: Secondary | ICD-10-CM | POA: Diagnosis present

## 2022-12-02 DIAGNOSIS — L89610 Pressure ulcer of right heel, unstageable: Secondary | ICD-10-CM | POA: Diagnosis present

## 2022-12-02 DIAGNOSIS — D638 Anemia in other chronic diseases classified elsewhere: Secondary | ICD-10-CM | POA: Diagnosis present

## 2022-12-02 DIAGNOSIS — Z9889 Other specified postprocedural states: Secondary | ICD-10-CM

## 2022-12-02 DIAGNOSIS — N170 Acute kidney failure with tubular necrosis: Principal | ICD-10-CM | POA: Diagnosis present

## 2022-12-02 DIAGNOSIS — N059 Unspecified nephritic syndrome with unspecified morphologic changes: Secondary | ICD-10-CM | POA: Diagnosis present

## 2022-12-02 DIAGNOSIS — Z885 Allergy status to narcotic agent status: Secondary | ICD-10-CM

## 2022-12-02 DIAGNOSIS — G061 Intraspinal abscess and granuloma: Secondary | ICD-10-CM | POA: Diagnosis present

## 2022-12-02 DIAGNOSIS — L02611 Cutaneous abscess of right foot: Secondary | ICD-10-CM

## 2022-12-02 DIAGNOSIS — M4640 Discitis, unspecified, site unspecified: Secondary | ICD-10-CM | POA: Diagnosis present

## 2022-12-02 DIAGNOSIS — M19041 Primary osteoarthritis, right hand: Secondary | ICD-10-CM | POA: Diagnosis present

## 2022-12-02 DIAGNOSIS — R338 Other retention of urine: Secondary | ICD-10-CM

## 2022-12-02 DIAGNOSIS — Z1152 Encounter for screening for COVID-19: Secondary | ICD-10-CM

## 2022-12-02 HISTORY — DX: Gastro-esophageal reflux disease without esophagitis: K21.9

## 2022-12-02 LAB — RAPID URINE DRUG SCREEN, HOSP PERFORMED
Amphetamines: NOT DETECTED
Barbiturates: NOT DETECTED
Benzodiazepines: NOT DETECTED
Cocaine: POSITIVE — AB
Opiates: NOT DETECTED
Tetrahydrocannabinol: POSITIVE — AB

## 2022-12-02 NOTE — ED Triage Notes (Signed)
Pt bib EMS with c/o back pain and urinary retention since Tuesday. Per EMS, they were notified by pt's brother who is a resident of a local nursing home that pt needed their services. EMS states that when they arrived, pt was covered in feces, house was extremely dirty, and pt had bugs "of all types" crawling all over him.

## 2022-12-03 ENCOUNTER — Emergency Department (HOSPITAL_COMMUNITY): Payer: Medicaid Other

## 2022-12-03 DIAGNOSIS — G629 Polyneuropathy, unspecified: Secondary | ICD-10-CM

## 2022-12-03 DIAGNOSIS — G9341 Metabolic encephalopathy: Secondary | ICD-10-CM | POA: Diagnosis present

## 2022-12-03 DIAGNOSIS — E441 Mild protein-calorie malnutrition: Secondary | ICD-10-CM | POA: Diagnosis present

## 2022-12-03 DIAGNOSIS — F1910 Other psychoactive substance abuse, uncomplicated: Secondary | ICD-10-CM | POA: Diagnosis not present

## 2022-12-03 DIAGNOSIS — R7881 Bacteremia: Secondary | ICD-10-CM | POA: Diagnosis not present

## 2022-12-03 DIAGNOSIS — G834 Cauda equina syndrome: Secondary | ICD-10-CM | POA: Diagnosis present

## 2022-12-03 DIAGNOSIS — B9562 Methicillin resistant Staphylococcus aureus infection as the cause of diseases classified elsewhere: Secondary | ICD-10-CM | POA: Diagnosis not present

## 2022-12-03 DIAGNOSIS — D638 Anemia in other chronic diseases classified elsewhere: Secondary | ICD-10-CM | POA: Diagnosis present

## 2022-12-03 DIAGNOSIS — F319 Bipolar disorder, unspecified: Secondary | ICD-10-CM

## 2022-12-03 DIAGNOSIS — M869 Osteomyelitis, unspecified: Secondary | ICD-10-CM | POA: Diagnosis not present

## 2022-12-03 DIAGNOSIS — L02611 Cutaneous abscess of right foot: Secondary | ICD-10-CM | POA: Diagnosis present

## 2022-12-03 DIAGNOSIS — G061 Intraspinal abscess and granuloma: Secondary | ICD-10-CM | POA: Diagnosis present

## 2022-12-03 DIAGNOSIS — E875 Hyperkalemia: Secondary | ICD-10-CM | POA: Diagnosis present

## 2022-12-03 DIAGNOSIS — E871 Hypo-osmolality and hyponatremia: Secondary | ICD-10-CM | POA: Diagnosis present

## 2022-12-03 DIAGNOSIS — N179 Acute kidney failure, unspecified: Secondary | ICD-10-CM

## 2022-12-03 DIAGNOSIS — M4647 Discitis, unspecified, lumbosacral region: Secondary | ICD-10-CM | POA: Diagnosis not present

## 2022-12-03 DIAGNOSIS — F418 Other specified anxiety disorders: Secondary | ICD-10-CM | POA: Diagnosis not present

## 2022-12-03 DIAGNOSIS — R339 Retention of urine, unspecified: Secondary | ICD-10-CM | POA: Diagnosis present

## 2022-12-03 DIAGNOSIS — R21 Rash and other nonspecific skin eruption: Secondary | ICD-10-CM | POA: Diagnosis not present

## 2022-12-03 DIAGNOSIS — M4646 Discitis, unspecified, lumbar region: Secondary | ICD-10-CM

## 2022-12-03 DIAGNOSIS — M86471 Chronic osteomyelitis with draining sinus, right ankle and foot: Secondary | ICD-10-CM | POA: Diagnosis not present

## 2022-12-03 DIAGNOSIS — F1121 Opioid dependence, in remission: Secondary | ICD-10-CM | POA: Diagnosis present

## 2022-12-03 DIAGNOSIS — N170 Acute kidney failure with tubular necrosis: Secondary | ICD-10-CM | POA: Diagnosis present

## 2022-12-03 DIAGNOSIS — F17210 Nicotine dependence, cigarettes, uncomplicated: Secondary | ICD-10-CM | POA: Diagnosis present

## 2022-12-03 DIAGNOSIS — L97429 Non-pressure chronic ulcer of left heel and midfoot with unspecified severity: Secondary | ICD-10-CM | POA: Diagnosis present

## 2022-12-03 DIAGNOSIS — E860 Dehydration: Secondary | ICD-10-CM | POA: Diagnosis present

## 2022-12-03 DIAGNOSIS — M4626 Osteomyelitis of vertebra, lumbar region: Secondary | ICD-10-CM | POA: Diagnosis present

## 2022-12-03 DIAGNOSIS — E870 Hyperosmolality and hypernatremia: Secondary | ICD-10-CM | POA: Diagnosis not present

## 2022-12-03 DIAGNOSIS — E876 Hypokalemia: Secondary | ICD-10-CM | POA: Diagnosis not present

## 2022-12-03 DIAGNOSIS — B181 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: Secondary | ICD-10-CM | POA: Diagnosis not present

## 2022-12-03 DIAGNOSIS — Z9889 Other specified postprocedural states: Secondary | ICD-10-CM | POA: Diagnosis not present

## 2022-12-03 DIAGNOSIS — Z6841 Body Mass Index (BMI) 40.0 and over, adult: Secondary | ICD-10-CM | POA: Diagnosis not present

## 2022-12-03 DIAGNOSIS — Z1152 Encounter for screening for COVID-19: Secondary | ICD-10-CM | POA: Diagnosis not present

## 2022-12-03 DIAGNOSIS — M4640 Discitis, unspecified, site unspecified: Secondary | ICD-10-CM | POA: Diagnosis not present

## 2022-12-03 DIAGNOSIS — N185 Chronic kidney disease, stage 5: Secondary | ICD-10-CM | POA: Diagnosis present

## 2022-12-03 DIAGNOSIS — J449 Chronic obstructive pulmonary disease, unspecified: Secondary | ICD-10-CM | POA: Diagnosis present

## 2022-12-03 DIAGNOSIS — G6289 Other specified polyneuropathies: Secondary | ICD-10-CM | POA: Diagnosis not present

## 2022-12-03 DIAGNOSIS — M86179 Other acute osteomyelitis, unspecified ankle and foot: Secondary | ICD-10-CM | POA: Diagnosis not present

## 2022-12-03 DIAGNOSIS — E872 Acidosis, unspecified: Secondary | ICD-10-CM | POA: Diagnosis present

## 2022-12-03 LAB — CBC
HCT: 38.4 % — ABNORMAL LOW (ref 39.0–52.0)
Hemoglobin: 13.1 g/dL (ref 13.0–17.0)
MCH: 28.7 pg (ref 26.0–34.0)
MCHC: 34.1 g/dL (ref 30.0–36.0)
MCV: 84.2 fL (ref 80.0–100.0)
Platelets: 446 10*3/uL — ABNORMAL HIGH (ref 150–400)
RBC: 4.56 MIL/uL (ref 4.22–5.81)
RDW: 15.5 % (ref 11.5–15.5)
WBC: 33.8 10*3/uL — ABNORMAL HIGH (ref 4.0–10.5)
nRBC: 0 % (ref 0.0–0.2)

## 2022-12-03 LAB — BASIC METABOLIC PANEL
Anion gap: 18 — ABNORMAL HIGH (ref 5–15)
BUN: 184 mg/dL — ABNORMAL HIGH (ref 6–20)
CO2: 11 mmol/L — ABNORMAL LOW (ref 22–32)
Calcium: 8.7 mg/dL — ABNORMAL LOW (ref 8.9–10.3)
Chloride: 104 mmol/L (ref 98–111)
Creatinine, Ser: 14.78 mg/dL — ABNORMAL HIGH (ref 0.61–1.24)
GFR, Estimated: 4 mL/min — ABNORMAL LOW (ref >60)
Glucose, Bld: 105 mg/dL — ABNORMAL HIGH (ref 70–99)
Potassium: 5 mmol/L (ref 3.5–5.1)
Sodium: 133 mmol/L — ABNORMAL LOW (ref 135–145)

## 2022-12-03 LAB — URINALYSIS, ROUTINE W REFLEX MICROSCOPIC
Bilirubin Urine: NEGATIVE
Glucose, UA: NEGATIVE mg/dL
Ketones, ur: NEGATIVE mg/dL
Nitrite: NEGATIVE
Protein, ur: 100 mg/dL — AB
Specific Gravity, Urine: 1.012 (ref 1.005–1.030)
WBC, UA: >50 WBC/hpf — ABNORMAL HIGH (ref 0–5)
pH: 7 (ref 5.0–8.0)

## 2022-12-03 LAB — CBC WITH DIFFERENTIAL/PLATELET
Abs Immature Granulocytes: 0 10*3/uL (ref 0.00–0.07)
Basophils Absolute: 0 10*3/uL (ref 0.0–0.1)
Basophils Relative: 0 %
Eosinophils Absolute: 0 10*3/uL (ref 0.0–0.5)
Eosinophils Relative: 0 %
HCT: 37.1 % — ABNORMAL LOW (ref 39.0–52.0)
Hemoglobin: 12.8 g/dL — ABNORMAL LOW (ref 13.0–17.0)
Lymphocytes Relative: 3 %
Lymphs Abs: 1 10*3/uL (ref 0.7–4.0)
MCH: 28.6 pg (ref 26.0–34.0)
MCHC: 34.5 g/dL (ref 30.0–36.0)
MCV: 82.8 fL (ref 80.0–100.0)
Monocytes Absolute: 4.2 10*3/uL — ABNORMAL HIGH (ref 0.1–1.0)
Monocytes Relative: 13 %
Neutro Abs: 27 10*3/uL — ABNORMAL HIGH (ref 1.7–7.7)
Neutrophils Relative %: 84 %
Platelets: 411 10*3/uL — ABNORMAL HIGH (ref 150–400)
RBC: 4.48 MIL/uL (ref 4.22–5.81)
RDW: 15 % (ref 11.5–15.5)
WBC: 32.2 10*3/uL — ABNORMAL HIGH (ref 4.0–10.5)
nRBC: 0 % (ref 0.0–0.2)

## 2022-12-03 LAB — COMPREHENSIVE METABOLIC PANEL
ALT: 15 U/L (ref 0–44)
AST: 17 U/L (ref 15–41)
Albumin: 2.7 g/dL — ABNORMAL LOW (ref 3.5–5.0)
Alkaline Phosphatase: 99 U/L (ref 38–126)
Anion gap: 26 — ABNORMAL HIGH (ref 5–15)
BUN: >300 mg/dL — ABNORMAL HIGH (ref 6–20)
CO2: 11 mmol/L — ABNORMAL LOW (ref 22–32)
Calcium: 8.8 mg/dL — ABNORMAL LOW (ref 8.9–10.3)
Chloride: 92 mmol/L — ABNORMAL LOW (ref 98–111)
Creatinine, Ser: 20.99 mg/dL — ABNORMAL HIGH (ref 0.61–1.24)
GFR, Estimated: 2 mL/min — ABNORMAL LOW (ref >60)
Glucose, Bld: 95 mg/dL (ref 70–99)
Potassium: 5.8 mmol/L — ABNORMAL HIGH (ref 3.5–5.1)
Sodium: 129 mmol/L — ABNORMAL LOW (ref 135–145)
Total Bilirubin: 1 mg/dL (ref 0.3–1.2)
Total Protein: 8.9 g/dL — ABNORMAL HIGH (ref 6.5–8.1)

## 2022-12-03 LAB — PROTIME-INR
INR: 1.4 — ABNORMAL HIGH (ref 0.8–1.2)
Prothrombin Time: 16.9 seconds — ABNORMAL HIGH (ref 11.4–15.2)

## 2022-12-03 LAB — RESP PANEL BY RT-PCR (FLU A&B, COVID) ARPGX2
Influenza A by PCR: NEGATIVE
Influenza B by PCR: NEGATIVE
SARS Coronavirus 2 by RT PCR: NEGATIVE

## 2022-12-03 LAB — LIPASE, BLOOD: Lipase: 222 U/L — ABNORMAL HIGH (ref 11–51)

## 2022-12-03 LAB — CK: Total CK: 40 U/L — ABNORMAL LOW (ref 49–397)

## 2022-12-03 LAB — LACTIC ACID, PLASMA: Lactic Acid, Venous: 1.2 mmol/L (ref 0.5–1.9)

## 2022-12-03 LAB — HIV ANTIBODY (ROUTINE TESTING W REFLEX): HIV Screen 4th Generation wRfx: NONREACTIVE

## 2022-12-03 LAB — PROCALCITONIN: Procalcitonin: 0.92 ng/mL

## 2022-12-03 LAB — CORTISOL-AM, BLOOD: Cortisol - AM: 37.2 ug/dL — ABNORMAL HIGH (ref 6.7–22.6)

## 2022-12-03 MED ORDER — VANCOMYCIN HCL IN DEXTROSE 1-5 GM/200ML-% IV SOLN: 1000.0000 mg | Freq: Once | INTRAVENOUS | Status: DC

## 2022-12-03 MED ORDER — ONDANSETRON HCL 4 MG PO TABS: 4.0000 mg | ORAL_TABLET | Freq: Four times a day (QID) | ORAL | Status: DC | PRN

## 2022-12-03 MED ORDER — ONDANSETRON HCL 4 MG/2ML IJ SOLN: 4.0000 mg | Freq: Four times a day (QID) | INTRAMUSCULAR | Status: DC | PRN

## 2022-12-03 MED ORDER — LACTATED RINGERS IV BOLUS
1000.0000 mL | Freq: Once | INTRAVENOUS | Status: AC
Start: 2022-12-03 — End: 2022-12-03
  Administered 2022-12-03: 1000 mL via INTRAVENOUS

## 2022-12-03 MED ORDER — ACETAMINOPHEN 650 MG RE SUPP: 650.0000 mg | Freq: Four times a day (QID) | RECTAL | Status: DC | PRN

## 2022-12-03 MED ORDER — VANCOMYCIN HCL 2000 MG/400ML IV SOLN
2000.0000 mg | Freq: Once | INTRAVENOUS | Status: AC
  Administered 2022-12-03: 2000 mg via INTRAVENOUS
  Filled 2022-12-03: qty 400

## 2022-12-03 MED ORDER — GABAPENTIN 300 MG PO CAPS
600.0000 mg | ORAL_CAPSULE | Freq: Three times a day (TID) | ORAL | Status: DC
  Administered 2022-12-03 – 2022-12-04 (×4): 600 mg via ORAL
  Filled 2022-12-03 (×4): qty 2

## 2022-12-03 MED ORDER — TIZANIDINE HCL 4 MG PO TABS
4.0000 mg | ORAL_TABLET | Freq: Four times a day (QID) | ORAL | Status: DC | PRN
  Administered 2022-12-07 – 2023-02-02 (×26): 4 mg via ORAL
  Filled 2022-12-03 (×29): qty 1

## 2022-12-03 MED ORDER — VANCOMYCIN VARIABLE DOSE PER UNSTABLE RENAL FUNCTION (PHARMACIST DOSING): Status: DC

## 2022-12-03 MED ORDER — ALBUTEROL SULFATE (2.5 MG/3ML) 0.083% IN NEBU: 2.5000 mg | INHALATION_SOLUTION | Freq: Four times a day (QID) | RESPIRATORY_TRACT | Status: DC | PRN

## 2022-12-03 MED ORDER — SODIUM CHLORIDE 0.9 % IV SOLN
1.0000 g | INTRAVENOUS | Status: DC
  Administered 2022-12-04: 1 g via INTRAVENOUS
  Filled 2022-12-03 (×2): qty 10

## 2022-12-03 MED ORDER — ONDANSETRON HCL 4 MG/2ML IJ SOLN
4.0000 mg | Freq: Once | INTRAMUSCULAR | Status: AC
  Administered 2022-12-03: 4 mg via INTRAVENOUS
  Filled 2022-12-03: qty 2

## 2022-12-03 MED ORDER — SODIUM CHLORIDE 0.9 % IV SOLN
2.0000 g | Freq: Once | INTRAVENOUS | Status: AC
  Administered 2022-12-03: 2 g via INTRAVENOUS
  Filled 2022-12-03: qty 12.5

## 2022-12-03 MED ORDER — SODIUM CHLORIDE 0.9 % IV SOLN
2.0000 g | Freq: Once | INTRAVENOUS | Status: AC
  Administered 2022-12-03: 2 g via INTRAVENOUS
  Filled 2022-12-03: qty 20

## 2022-12-03 MED ORDER — METRONIDAZOLE 500 MG/100ML IV SOLN
500.0000 mg | Freq: Two times a day (BID) | INTRAVENOUS | Status: DC
  Administered 2022-12-03 – 2022-12-04 (×4): 500 mg via INTRAVENOUS
  Filled 2022-12-03 (×4): qty 100

## 2022-12-03 MED ORDER — ACETAMINOPHEN 325 MG PO TABS
650.0000 mg | ORAL_TABLET | Freq: Four times a day (QID) | ORAL | Status: DC | PRN
  Administered 2022-12-07 – 2022-12-18 (×2): 650 mg via ORAL
  Filled 2022-12-03 (×2): qty 2

## 2022-12-03 MED ORDER — SODIUM CHLORIDE 0.9 % IV SOLN: INTRAVENOUS | Status: DC

## 2022-12-03 MED ORDER — ALBUTEROL SULFATE HFA 108 (90 BASE) MCG/ACT IN AERS: 2.0000 | INHALATION_SPRAY | Freq: Four times a day (QID) | RESPIRATORY_TRACT | Status: DC | PRN

## 2022-12-03 MED ORDER — SERTRALINE HCL 25 MG PO TABS
25.0000 mg | ORAL_TABLET | Freq: Every day | ORAL | Status: DC
  Administered 2022-12-03 – 2023-02-03 (×61): 25 mg via ORAL
  Filled 2022-12-03 (×61): qty 1

## 2022-12-03 MED ORDER — TRAZODONE HCL 50 MG PO TABS
25.0000 mg | ORAL_TABLET | Freq: Every evening | ORAL | Status: DC | PRN
  Administered 2022-12-09 – 2023-02-01 (×20): 25 mg via ORAL
  Filled 2022-12-03 (×19): qty 1

## 2022-12-03 MED ORDER — BUPRENORPHINE HCL-NALOXONE HCL 2-0.5 MG SL SUBL
1.0000 | SUBLINGUAL_TABLET | Freq: Two times a day (BID) | SUBLINGUAL | Status: DC
  Administered 2022-12-03 – 2023-02-03 (×124): 1 via SUBLINGUAL
  Filled 2022-12-03 (×127): qty 1

## 2022-12-03 MED ORDER — MAGNESIUM HYDROXIDE 400 MG/5ML PO SUSP: 30.0000 mL | Freq: Every day | ORAL | Status: DC | PRN

## 2022-12-03 MED ORDER — ENOXAPARIN SODIUM 30 MG/0.3ML IJ SOSY
30.0000 mg | PREFILLED_SYRINGE | INTRAMUSCULAR | Status: DC
  Administered 2022-12-03 – 2022-12-04 (×2): 30 mg via SUBCUTANEOUS
  Filled 2022-12-03 (×3): qty 0.3

## 2022-12-03 MED ORDER — CHLORHEXIDINE GLUCONATE CLOTH 2 % EX PADS
6.0000 | MEDICATED_PAD | Freq: Every day | CUTANEOUS | Status: DC
  Administered 2022-12-05 – 2022-12-14 (×9): 6 via TOPICAL

## 2022-12-03 NOTE — ED Provider Notes (Signed)
[facility] EMERGENCY DEPARTMENT Provider Note   CSN: [id number] Arrival date & time: 12/02/22  2226     History  Chief Complaint  Patient presents with   Urinary Retention   Back Pain    Hunter Reyes is a 46 y.o. male.  Patient presents to the emergency department with complaints of back pain and inability to pass urine.  Patient reports that he has not passed urine in 3 days.  Apparently patient's family called EMS because he was not able to care for himself.  EMS reports that the patient was found covered in feces with bugs crawling on him.  Patient reports that he has had diarrhea for several days.       Home Medications Prior to Admission medications   Medication Sig Start Date End Date Taking? Authorizing Provider  acetaminophen (TYLENOL) 500 MG tablet Take 1,000 mg by mouth every 6 (six) hours as needed for mild pain or headache.    [provider]  albuterol (PROVENTIL HFA;VENTOLIN HFA) 108 (90 BASE) MCG/ACT inhaler Inhale into the lungs every 6 (six) hours as needed for wheezing or shortness of breath.    [provider]  buprenorphine-naloxone (SUBOXONE) 2-0.5 mg SUBL SL tablet Place 1 tablet under the tongue 2 (two) times daily.    [provider]  gabapentin (NEURONTIN) 300 MG capsule Take 900 mg by mouth 4 (four) times daily.     [provider]  gabapentin (NEURONTIN) 300 MG capsule Take 2 capsules (600 mg total) by mouth 3 (three) times daily. 11/12/17   Trula Slade, DPM  ibuprofen (ADVIL,MOTRIN) 600 MG tablet Take 1 tablet (600 mg total) by mouth 4 (four) times daily. 06/27/17   Lily Kocher, PA-C  sertraline (ZOLOFT) 50 MG tablet Take 25 mg by mouth daily.     [provider]  tiZANidine (ZANAFLEX) 4 MG tablet Take 4 mg by mouth every 6 (six) hours as needed for muscle spasms.    [provider]      Allergies    Darvocet [propoxyphene n-acetaminophen], Tramadol, and Other    Review of Systems    Review of Systems  Physical Exam Updated Vital Signs BP 122/71   Pulse 98   Temp 98.3 F (36.8 C) (Oral)   Resp 19   Ht 6\' 2"  (1.88 m)   Wt (!) 149 kg   SpO2 95%   BMI 42.18 kg/m  Physical Exam Vitals and nursing note reviewed.  Constitutional:      General: He is not in acute distress.    Appearance: He is well-developed. He is obese.  HENT:     Head: Normocephalic and atraumatic.     Mouth/Throat:     Mouth: Mucous membranes are moist.  Eyes:     General: Vision grossly intact. Gaze aligned appropriately.     Extraocular Movements: Extraocular movements intact.     Conjunctiva/sclera: Conjunctivae normal.  Cardiovascular:     Rate and Rhythm: Normal rate and regular rhythm.     Pulses: Normal pulses.     Heart sounds: Normal heart sounds, S1 normal and S2 normal. No murmur heard.    No friction rub. No gallop.  Pulmonary:     Effort: Pulmonary effort is normal. No respiratory distress.     Breath sounds: Normal breath sounds.  Abdominal:     Palpations: Abdomen is soft.     Tenderness: There is no abdominal tenderness. There is right CVA tenderness and left CVA tenderness. There is  no guarding or rebound.     Hernia: No hernia is present.  Musculoskeletal:        General: No swelling.     Cervical back: Full passive range of motion without pain, normal range of motion and neck supple. No pain with movement, spinous process tenderness or muscular tenderness. Normal range of motion.     Right lower leg: No edema.     Left lower leg: No edema.  Skin:    General: Skin is warm and dry.     Capillary Refill: Capillary refill takes less than 2 seconds.     Findings: No ecchymosis, erythema, lesion or wound.  Neurological:     Mental Status: He is alert and oriented to person, place, and time.     GCS: GCS eye subscore is 4. GCS verbal subscore is 5. GCS motor subscore is 6.     Cranial Nerves: Cranial nerves 2-12 are intact.     Sensory: Sensation is intact.      Motor: Motor function is intact. No weakness or abnormal muscle tone.     Coordination: Coordination is intact.  Psychiatric:        Mood and Affect: Mood normal.        Speech: Speech normal.        Behavior: Behavior normal.     ED Results / Procedures / Treatments   Labs (all labs ordered are listed, but only abnormal results are displayed) Labs Reviewed  CBC WITH DIFFERENTIAL/PLATELET - Abnormal; Notable for the following components:      Result Value   WBC 32.2 (*)    Hemoglobin 12.8 (*)    HCT 37.1 (*)    Platelets 411 (*)    Neutro Abs 27.0 (*)    Monocytes Absolute 4.2 (*)    All other components within normal limits  COMPREHENSIVE METABOLIC PANEL - Abnormal; Notable for the following components:   Sodium 129 (*)    Potassium 5.8 (*)    Chloride 92 (*)    CO2 11 (*)    BUN >300 (*)    Creatinine, Ser 20.99 (*)    Calcium 8.8 (*)    Total Protein 8.9 (*)    Albumin 2.7 (*)    GFR, Estimated 2 (*)    Anion gap 26 (*)    All other components within normal limits  LIPASE, BLOOD - Abnormal; Notable for the following components:   Lipase 222 (*)    All other components within normal limits  URINALYSIS, ROUTINE W REFLEX MICROSCOPIC - Abnormal; Notable for the following components:   Color, Urine AMBER (*)    APPearance CLOUDY (*)    Hgb urine dipstick MODERATE (*)    Protein, ur 100 (*)    Leukocytes,Ua LARGE (*)    WBC, UA >50 (*)    Bacteria, UA RARE (*)    Non Squamous Epithelial 0-5 (*)    All other components within normal limits  RAPID URINE DRUG SCREEN, HOSP PERFORMED - Abnormal; Notable for the following components:   Cocaine POSITIVE (*)    Tetrahydrocannabinol POSITIVE (*)    All other components within normal limits  CK - Abnormal; Notable for the following components:   Total CK 40 (*)    All other components within normal limits  RESP PANEL BY RT-PCR (FLU A&B, COVID) ARPGX2  CULTURE, BLOOD (ROUTINE X 2)  CULTURE, BLOOD (ROUTINE X 2)  URINE  CULTURE  LACTIC ACID, PLASMA    EKG None  Radiology CT RENAL STONE STUDY  Result Date: 12/03/2022 CLINICAL DATA:  Back pain, urinary retention. Bladder-neck obstruction EXAM: CT ABDOMEN AND PELVIS WITHOUT CONTRAST TECHNIQUE: Multidetector CT imaging of the abdomen and pelvis was performed following the standard protocol without IV contrast. RADIATION DOSE REDUCTION: This exam was performed according to the departmental dose-optimization program which includes automated exposure control, adjustment of the mA and/or kV according to patient size and/or use of iterative reconstruction technique. COMPARISON:  11/25/2022 FINDINGS: Lower chest: Partial right lower lobe collapse. Trace right pleural effusion, new since prior exam. Cardiac size within normal limits. Hepatobiliary: No focal liver abnormality is seen. No gallstones, gallbladder wall thickening, or biliary dilatation. Pancreas: Unremarkable Spleen: Stable mild splenomegaly with the spleen measuring 15.5 cm in greatest dimension. No intrasplenic lesion seen. Adrenals/Urinary Tract: The adrenal glands are unremarkable. The kidneys are normal in size and position. Moderate bilateral hydronephrosis and hydroureter persists, but is improved since prior examination. Stable mild asymmetric right perinephric and periureteric stranding, possibly related to ascending infection. No perinephric fluid collections are identified. No intrarenal or ureteral calculi are seen. Interval Foley catheter placement with the balloon noted within the decompressed bladder lumen. Bladder wall is circumferentially thickened suggesting changes of underlying chronic bladder outlet obstruction. Stomach/Bowel: Stomach is within normal limits. Appendix appears normal. No evidence of bowel wall thickening, distention, or inflammatory changes. Vascular/Lymphatic: Mild aortoiliac atherosclerotic calcification. No aortic aneurysm. Since the prior examination, there has developed  infiltrative change within the retroperitoneum surrounding the abdominal aorta and inferior vena cava as well as the vertebral column best appreciated on axial image # 50/2. This infiltrative change tracks inferiorly along the great vessels to the level of L4-5 just distal to the inferior vena caval confluence. No loculated paraspinal fluid collections are identified. No pathologic adenopathy is seen. Reproductive: The prostate gland is mildly enlarged. Other: Small bilateral fat containing inguinal hernias are present. No abdominopelvic ascites. Musculoskeletal: Degenerative changes are again seen throughout the lumbar spine. However, previously noted vacuum disc phenomena at L2-3 and L3-4 is no longer visualized. Additionally, this is in the region of new paravertebral and periaortic infiltrative change which may relate to local inflammatory change as can be seen in early changes of discitis. There is no associated osseous erosion or paravertebral fluid collection identified. No acute bone abnormality. IMPRESSION: 1. Interval development of infiltrative change within the retroperitoneum surrounding the abdominal aorta and inferior vena cava as well as the vertebral column. This tracks inferiorly along the great vessels to the level of L4-5 just distal to the inferior vena caval confluence. Additionally, in this region, previously noted vacuum disc phenomena at L2-3 and L3-4 is no longer visualized and the findings may relate to an acute inflammatory process such as diskitis. Correlation with clinical examination is recommended. Contrast enhanced lumbar MRI examination may be more helpful for further evaluation. 2. Moderate bilateral hydronephrosis and hydroureter, improved since prior examination though not yet completely resolved. Stable mild asymmetric right perinephric and periureteric stranding, possibly related to ascending infection. No perinephric fluid collections identified. 3. Interval Foley catheter  placement with the balloon within the decompressed bladder lumen. Bladder wall is circumferentially thickened suggesting changes of underlying chronic bladder outlet obstruction. 4. Stable mild splenomegaly. 5. Partial right lower lobe collapse. Trace right pleural effusion, new since prior exam. 6.  Aortic Atherosclerosis (ICD10-I70.0). Electronically Signed   By: Fidela Salisbury M.D.   On: 12/03/2022 03:03   DG Chest Port 1 View  Result Date: 12/02/2022 CLINICAL DATA:  Shortness  of breath, back pain, urinary retention. History of COPD. EXAM: PORTABLE CHEST 1 VIEW COMPARISON:  09/28/2017. FINDINGS: The heart size and mediastinal contours are within normal limits. Lung volumes are low with atelectasis at the lung bases. No effusion or pneumothorax. No acute osseous abnormality. IMPRESSION: Low lung volumes with atelectasis at the lung bases. Electronically Signed   By: Brett Fairy M.D.   On: 12/02/2022 23:40    Procedures Procedures    Medications Ordered in ED Medications  vancomycin (VANCOREADY) IVPB 2000 mg/400 mL (has no administration in time range)  cefTRIAXone (ROCEPHIN) 2 g in sodium chloride 0.9 % 100 mL IVPB (0 g Intravenous Stopped 12/03/22 0238)  lactated ringers bolus 1,000 mL (0 mLs Intravenous Stopped 12/03/22 0315)  ondansetron (ZOFRAN) injection 4 mg (4 mg Intravenous Given 12/03/22 0209)    ED Course/ Medical Decision Making/ A&P                           Medical Decision Making Amount and/or Complexity of Data Reviewed Independent Historian: EMS External Data Reviewed: labs, radiology, ECG and notes. Labs: ordered. Decision-making details documented in ED Course. Radiology: ordered and independent interpretation performed. Decision-making details documented in ED Course. ECG/medicine tests: ordered and independent interpretation performed. Decision-making details documented in ED Course.  Risk Prescription drug management.   Patient presents to the emergency  department from home by ambulance.  Patient's family members called EMS after he became concerned about his welfare.  Arrival to the emergency department, patient reports he has back pain and he has not been able to pass urine for at least 3 days.   Patient had a bladder scan at arrival that showed greater than 1 L of urine in his bladder.  A Foley catheter was placed.  Patient had prompt output of 4 L of urine.  Lab work reveals significant leukocytosis.  Additionally, patient is extremely uremic with acute renal failure, BUN greater than 300, creatinine 20.  This was discussed with Dr. Augustin Coupe, on-call for nephrology.  He felt that the patient might improve the outlet obstruction this year, does not need urgent dialysis or transfer to [facility] for this problem.  I did, however, performed a noncontrast CT to further evaluate for other problems and anatomy.  There is concern for possible discitis.  Reviewing his records reveals he has a history of osteomyelitis.  Patient had been given Rocephin for UTI.  Will add vancomycin.  Likely would benefit from admission to [facility] for infectious disease consult.  CRITICAL CARE Performed by: Orpah Greek   Total critical care time: 33 minutes  Critical care time was exclusive of separately billable procedures and treating other patients.  Critical care was necessary to treat or prevent imminent or life-threatening deterioration.  Critical care was time spent personally by me on the following activities: development of treatment plan with patient and/or surrogate as well as nursing, discussions with consultants, evaluation of patient's response to treatment, examination of patient, obtaining history from patient or surrogate, ordering and performing treatments and interventions, ordering and review of laboratory studies, ordering and review of radiographic studies, pulse oximetry and re-evaluation of patient's condition.         Final  Clinical Impression(s) / ED Diagnoses Final diagnoses:  Acute renal failure, unspecified acute renal failure type [facility])    Rx / DC Orders ED Discharge Orders     None         Doral Digangi, Harrell Gave  J, MD 12/03/22 2060

## 2022-12-03 NOTE — Consult Note (Addendum)
Reason for Consult: Acute Kidney Injury Referring Physician: Dr. Joseph Berkshire  Chief Complaint: Back pain and inability to void  Assessment/Plan: Acute renal failure: last labs in Epic was 09/28/2017 with a cr of 0.95. By patient history he has not been able to void for  3 days and hopefully this is the case. He already had mild to moderate hydronephrosis with hydroureter on CT (w/o contrast) from 12/3 but no labs were drawn at that time; no labs from care everywhere for many years and according to the pt no blood draws in many years. If he has had significant obstruction for over a mth the renal failure may not be reversible.  Will need to call family members in the AM to determine if he has a PMD for any outside labs to help determine chronicity. - Foley to gravity for now and will need to see urology as well. - Infiltrative changes in the retroperitoneum along vertebral column concerning as well and he is being transferred to [facility] for further w/u and ID consultation. - Will need a temporary catheter to initiate dialysis w/ frank uremia and transition to tunneled if there is no signs of improvement; concerning for progression to ESRD with history of dysuria and obstructive symptoms for 6 mths. Would not place a TC at this time with elevated WBC with diarrhea, UTI, possible discitis.  -Maintain MAP>65 for optimal renal perfusion.  - Avoid nephrotoxic medications including NSAIDs and iodinated intravenous contrast exposure unless the latter is absolutely indicated.   - Preferred narcotic agents for pain control are hydromorphone, fentanyl, and methadone. Morphine should not be used.  - Avoid Baclofen and avoid oral sodium phosphate and magnesium citrate based laxatives / bowel preps.  - Continue strict Input and Output monitoring. Will monitor the patient closely with you and intervene or adjust therapy as indicated by changes in clinical status/labs  UTI/ infiltration along RP/ aorta/IVC/  vertebral column concerning for discitis as well being transferred for further w/u and ID consultation. Right leg weakness as well. Bipolar disorder per primary COPD on inhalers and appears to be compensated.    HPI: Hunter Reyes is an 46 y.o. male  COPD, bipolar disorder, depression, panic attacks with recent UTI a week ago when he was seen in the ED and discharged on Keflex 500mg  TID. Patient now brought in by EMS with back pain and inability to pass urine for 2-3 days. Patient's family actually were the ones who called EMS because the patient was not able to take care of himself. On review of chart EMS found the pt covered in feces with bus crawling on him and pt also reports diarrhea for many days. Patient is on ibuprofen QID on MAR but he states that he only takes ibuprofen 1-2x a week.  CT [facility] [place] showed interval development of infiltrative change within the retroperitoneum surrounding the abdominal aorta and inferior vena cava as well as the vertebral column tracksing inferiorly along the great vessels to the level of L4-5 just distal to the inferior vena caval confluence moderate bilateral hydronephrosis and hydroureter and bladder wall thickening circumferentially suggesting changes of underlying chronic bladder outlet obstruction. Of note the patient had mild to moderate hydro with hydroureter on CT 12/3.   In the ED the patient's BP was 159/70, afebrile with a HR 100's with sats 96% on RA. Potassium was 5.8, HCO3 11, BUN/Cr >300/20.99, WBC 32.2 Hb 12.8.  Foley was placed with >3L UOP.   ROS Pertinent items are noted  in HPI.  Chemistry and CBC: Creat  Date/Time Value Ref Range Status  02/06/2016 02:58 PM 0.97 0.60 - 1.35 mg/dL Final  06/24/2013 10:24 AM 1.02 0.50 - 1.35 mg/dL Final   Creatinine, Ser  Date/Time Value Ref Range Status  12/03/2022 12:07 AM 20.99 (H) 0.61 - 1.24 mg/dL Final  09/28/2017 01:44 PM 0.95 0.61 - 1.24 mg/dL Final  06/27/2017 02:23 PM 0.77 0.61 - 1.24 mg/dL  Final  11/17/2013 08:11 AM 0.80 0.50 - 1.35 mg/dL Final  05/01/2013 05:26 AM 1.01 0.50 - 1.35 mg/dL Final  04/30/2013 05:36 AM 0.98 0.50 - 1.35 mg/dL Final  04/29/2013 09:30 PM 0.94 0.50 - 1.35 mg/dL Final  04/13/2009 05:10 AM 0.85 0.4 - 1.5 mg/dL Final  04/12/2009 06:00 PM 0.78 0.4 - 1.5 mg/dL Final  08/31/2008 05:25 AM 0.93  Final  08/30/2008 06:05 PM 0.87  Final   Recent Labs  Lab 12/03/22 0007  NA 129*  K 5.8*  CL 92*  CO2 11*  GLUCOSE 95  BUN >300*  CREATININE 20.99*  CALCIUM 8.8*   Recent Labs  Lab 12/03/22 0007  WBC 32.2*  NEUTROABS 27.0*  HGB 12.8*  HCT 37.1*  MCV 82.8  PLT 411*   Liver Function Tests: Recent Labs  Lab 12/03/22 0007  AST 17  ALT 15  ALKPHOS 99  BILITOT 1.0  PROT 8.9*  ALBUMIN 2.7*   Recent Labs  Lab 12/03/22 0007  LIPASE 222*   No results for input(s): "AMMONIA" in the last 168 hours. Cardiac Enzymes: Recent Labs  Lab 12/03/22 0007  CKTOTAL 40*   Iron Studies: No results for input(s): "IRON", "TIBC", "TRANSFERRIN", "FERRITIN" in the last 72 hours. PT/INR: @LABRCNTIP (inr:5)  Xrays/Other Studies: ) Results for orders placed or performed during the hospital encounter of 12/02/22 (from the past 48 hour(s))  Urinalysis, Routine w reflex microscopic     Status: Abnormal   Collection Time: 12/02/22 11:25 PM  Result Value Ref Range   Color, Urine AMBER (A) YELLOW    Comment: BIOCHEMICALS MAY BE AFFECTED BY COLOR   APPearance CLOUDY (A) CLEAR   Specific Gravity, Urine 1.012 1.005 - 1.030   pH 7.0 5.0 - 8.0   Glucose, UA NEGATIVE NEGATIVE mg/dL   Hgb urine dipstick MODERATE (A) NEGATIVE   Bilirubin Urine NEGATIVE NEGATIVE   Ketones, ur NEGATIVE NEGATIVE mg/dL   Protein, ur 100 (A) NEGATIVE mg/dL   Nitrite NEGATIVE NEGATIVE   Leukocytes,Ua LARGE (A) NEGATIVE   RBC / HPF 11-20 0 - 5 RBC/hpf   WBC, UA >50 (H) 0 - 5 WBC/hpf   Bacteria, UA RARE (A) NONE SEEN   WBC Clumps PRESENT    Budding Yeast PRESENT    Non Squamous  Epithelial 0-5 (A) NONE SEEN    Comment: Performed at [facility], [address], [place], [place] [address]  Rapid urine drug screen (hospital performed)     Status: Abnormal   Collection Time: 12/02/22 11:25 PM  Result Value Ref Range   Opiates NONE DETECTED NONE DETECTED   Cocaine POSITIVE (A) NONE DETECTED   Benzodiazepines NONE DETECTED NONE DETECTED   Amphetamines NONE DETECTED NONE DETECTED   Tetrahydrocannabinol POSITIVE (A) NONE DETECTED   Barbiturates NONE DETECTED NONE DETECTED    Comment: (NOTE) DRUG SCREEN FOR MEDICAL PURPOSES ONLY.  IF CONFIRMATION IS NEEDED FOR ANY PURPOSE, NOTIFY LAB WITHIN 5 DAYS.  LOWEST DETECTABLE LIMITS FOR URINE DRUG SCREEN Drug Class  Cutoff (ng/mL) Amphetamine and metabolites    1000 Barbiturate and metabolites    200 Benzodiazepine                 200 Opiates and metabolites        300 Cocaine and metabolites        300 THC                            50 Performed at [facility], [address], [place], [place] [address]   Resp Panel by RT-PCR (Flu A&B, Covid) Anterior Nasal Swab     Status: None   Collection Time: 12/02/22 11:30 PM   Specimen: Anterior Nasal Swab  Result Value Ref Range   SARS Coronavirus 2 by RT PCR NEGATIVE NEGATIVE    Comment: (NOTE) SARS-CoV-2 target nucleic acids are NOT DETECTED.  The SARS-CoV-2 RNA is generally detectable in upper respiratory specimens during the acute phase of infection. The lowest concentration of SARS-CoV-2 viral copies this assay can detect is 138 copies/mL. A negative result does not preclude SARS-Cov-2 infection and should not be used as the sole basis for treatment or other patient management decisions. A negative result may occur with  improper specimen collection/handling, submission of specimen other than nasopharyngeal swab, presence of viral mutation(s) within the areas targeted by this assay, and inadequate number of viral copies(<138 copies/mL). A negative  result must be combined with clinical observations, patient history, and epidemiological information. The expected result is Negative.  Fact Sheet for Patients:  EntrepreneurPulse.com.au  Fact Sheet for Healthcare Providers:  IncredibleEmployment.be  This test is no t yet approved or cleared by the [place] FDA and  has been authorized for detection and/or diagnosis of SARS-CoV-2 by FDA under an Emergency Use Authorization (EUA). This EUA will remain  in effect (meaning this test can be used) for the duration of the COVID-19 declaration under Section 564(b)(1) of the Act, 21 U.S.C.section 360bbb-3(b)(1), unless the authorization is terminated  or revoked sooner.       Influenza A by PCR NEGATIVE NEGATIVE   Influenza B by PCR NEGATIVE NEGATIVE    Comment: (NOTE) The Xpert Xpress SARS-CoV-2/FLU/RSV plus assay is intended as an aid in the diagnosis of influenza from Nasopharyngeal swab specimens and should not be used as a sole basis for treatment. Nasal washings and aspirates are unacceptable for Xpert Xpress SARS-CoV-2/FLU/RSV testing.  Fact Sheet for Patients: EntrepreneurPulse.com.au  Fact Sheet for Healthcare Providers: IncredibleEmployment.be  This test is not yet approved or cleared by the [place] FDA and has been authorized for detection and/or diagnosis of SARS-CoV-2 by FDA under an Emergency Use Authorization (EUA). This EUA will remain in effect (meaning this test can be used) for the duration of the COVID-19 declaration under Section 564(b)(1) of the Act, 21 U.S.C. section 360bbb-3(b)(1), unless the authorization is terminated or revoked.  Performed at [facility], [address], [place], [place] [address]   CBC with Differential/Platelet     Status: Abnormal   Collection Time: 12/03/22 12:07 AM  Result Value Ref Range   WBC 32.2 (H) 4.0 - 10.5 K/uL   RBC 4.48 4.22 - 5.81 MIL/uL    Hemoglobin 12.8 (L) 13.0 - 17.0 g/dL   HCT 37.1 (L) 39.0 - 52.0 %   MCV 82.8 80.0 - 100.0 fL   MCH 28.6 26.0 - 34.0 pg   MCHC 34.5 30.0 - 36.0 g/dL   RDW 15.0 11.5 -  15.5 %   Platelets 411 (H) 150 - 400 K/uL   nRBC 0.0 0.0 - 0.2 %   Neutrophils Relative % 84 %   Neutro Abs 27.0 (H) 1.7 - 7.7 K/uL   Lymphocytes Relative 3 %   Lymphs Abs 1.0 0.7 - 4.0 K/uL   Monocytes Relative 13 %   Monocytes Absolute 4.2 (H) 0.1 - 1.0 K/uL   Eosinophils Relative 0 %   Eosinophils Absolute 0.0 0.0 - 0.5 K/uL   Basophils Relative 0 %   Basophils Absolute 0.0 0.0 - 0.1 K/uL   WBC Morphology MILD LEFT SHIFT (1-5% METAS, OCC MYELO, OCC BANDS)    Abs Immature Granulocytes 0.00 0.00 - 0.07 K/uL    Comment: Performed at [facility], [address], [place], [place] [address]  Comprehensive metabolic panel     Status: Abnormal   Collection Time: 12/03/22 12:07 AM  Result Value Ref Range   Sodium 129 (L) 135 - 145 mmol/L   Potassium 5.8 (H) 3.5 - 5.1 mmol/L   Chloride 92 (L) 98 - 111 mmol/L   CO2 11 (L) 22 - 32 mmol/L   Glucose, Bld 95 70 - 99 mg/dL    Comment: Glucose reference range applies only to samples taken after fasting for at least 8 hours.   BUN >300 (H) 6 - 20 mg/dL    Comment: RESULTS CONFIRMED BY MANUAL DILUTION   Creatinine, Ser 20.99 (H) 0.61 - 1.24 mg/dL   Calcium 8.8 (L) 8.9 - 10.3 mg/dL   Total Protein 8.9 (H) 6.5 - 8.1 g/dL   Albumin 2.7 (L) 3.5 - 5.0 g/dL   AST 17 15 - 41 U/L   ALT 15 0 - 44 U/L   Alkaline Phosphatase 99 38 - 126 U/L   Total Bilirubin 1.0 0.3 - 1.2 mg/dL   GFR, Estimated 2 (L) >60 mL/min    Comment: (NOTE) Calculated using the CKD-EPI Creatinine Equation (2021)    Anion gap 26 (H) 5 - 15    Comment: Electrolytes repeated to confirm. Performed at [facility], [address] [place], [place] [address]   Lipase, blood     Status: Abnormal   Collection Time: 12/03/22 12:07 AM  Result Value Ref Range   Lipase 222 (H) 11 - 51 U/L    Comment: RESULTS  CONFIRMED BY MANUAL DILUTION Performed at [facility], [address], [place], [place] [address]   Lactic acid, plasma     Status: None   Collection Time: 12/03/22 12:07 AM  Result Value Ref Range   Lactic Acid, Venous 1.2 0.5 - 1.9 mmol/L    Comment: Performed at [facility], [address] [place], [place] [address]  CK     Status: Abnormal   Collection Time: 12/03/22 12:07 AM  Result Value Ref Range   Total CK 40 (L) 49 - 397 U/L    Comment: Performed at [facility], [address], [place], [place] [address]   CT RENAL STONE STUDY  Result Date: 12/03/2022 CLINICAL DATA:  Back pain, urinary retention. Bladder-neck obstruction EXAM: CT ABDOMEN AND PELVIS WITHOUT CONTRAST TECHNIQUE: Multidetector CT imaging of the abdomen and pelvis was performed following the standard protocol without IV contrast. RADIATION DOSE REDUCTION: This exam was performed according to the departmental dose-optimization program which includes automated exposure control, adjustment of the mA and/or kV according to patient size and/or use of iterative reconstruction technique. COMPARISON:  11/25/2022 FINDINGS: Lower chest: Partial right lower lobe collapse. Trace right pleural effusion, new  since prior exam. Cardiac size within normal limits. Hepatobiliary: No focal liver abnormality is seen. No gallstones, gallbladder wall thickening, or biliary dilatation. Pancreas: Unremarkable Spleen: Stable mild splenomegaly with the spleen measuring 15.5 cm in greatest dimension. No intrasplenic lesion seen. Adrenals/Urinary Tract: The adrenal glands are unremarkable. The kidneys are normal in size and position. Moderate bilateral hydronephrosis and hydroureter persists, but is improved since prior examination. Stable mild asymmetric right perinephric and periureteric stranding, possibly related to ascending infection. No perinephric fluid collections are identified. No intrarenal or ureteral calculi are seen. Interval Foley catheter  placement with the balloon noted within the decompressed bladder lumen. Bladder wall is circumferentially thickened suggesting changes of underlying chronic bladder outlet obstruction. Stomach/Bowel: Stomach is within normal limits. Appendix appears normal. No evidence of bowel wall thickening, distention, or inflammatory changes. Vascular/Lymphatic: Mild aortoiliac atherosclerotic calcification. No aortic aneurysm. Since the prior examination, there has developed infiltrative change within the retroperitoneum surrounding the abdominal aorta and inferior vena cava as well as the vertebral column best appreciated on axial image # 50/2. This infiltrative change tracks inferiorly along the great vessels to the level of L4-5 just distal to the inferior vena caval confluence. No loculated paraspinal fluid collections are identified. No pathologic adenopathy is seen. Reproductive: The prostate gland is mildly enlarged. Other: Small bilateral fat containing inguinal hernias are present. No abdominopelvic ascites. Musculoskeletal: Degenerative changes are again seen throughout the lumbar spine. However, previously noted vacuum disc phenomena at L2-3 and L3-4 is no longer visualized. Additionally, this is in the region of new paravertebral and periaortic infiltrative change which may relate to local inflammatory change as can be seen in early changes of discitis. There is no associated osseous erosion or paravertebral fluid collection identified. No acute bone abnormality. IMPRESSION: 1. Interval development of infiltrative change within the retroperitoneum surrounding the abdominal aorta and inferior vena cava as well as the vertebral column. This tracks inferiorly along the great vessels to the level of L4-5 just distal to the inferior vena caval confluence. Additionally, in this region, previously noted vacuum disc phenomena at L2-3 and L3-4 is no longer visualized and the findings may relate to an acute inflammatory  process such as diskitis. Correlation with clinical examination is recommended. Contrast enhanced lumbar MRI examination may be more helpful for further evaluation. 2. Moderate bilateral hydronephrosis and hydroureter, improved since prior examination though not yet completely resolved. Stable mild asymmetric right perinephric and periureteric stranding, possibly related to ascending infection. No perinephric fluid collections identified. 3. Interval Foley catheter placement with the balloon within the decompressed bladder lumen. Bladder wall is circumferentially thickened suggesting changes of underlying chronic bladder outlet obstruction. 4. Stable mild splenomegaly. 5. Partial right lower lobe collapse. Trace right pleural effusion, new since prior exam. 6.  Aortic Atherosclerosis (ICD10-I70.0). Electronically Signed   By: Fidela Salisbury M.D.   On: 12/03/2022 03:03   DG Chest Port 1 View  Result Date: 12/02/2022 CLINICAL DATA:  Shortness of breath, back pain, urinary retention. History of COPD. EXAM: PORTABLE CHEST 1 VIEW COMPARISON:  09/28/2017. FINDINGS: The heart size and mediastinal contours are within normal limits. Lung volumes are low with atelectasis at the lung bases. No effusion or pneumothorax. No acute osseous abnormality. IMPRESSION: Low lung volumes with atelectasis at the lung bases. Electronically Signed   By: Brett Fairy M.D.   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[place])    Neuropathy     Non-restorable tooth 10/2013   multiple   Obesity    Osteomyelitis ([place]) 2014   right foot   Panic attacks    Peripheral neuropathy     PSH:   Past Surgical History:  Procedure Laterality Date   AMPUTATION     left middle toe   BREAST BIOPSY Right 07/12/2017   Procedure: BREAST BIOPSY;  Surgeon: Aviva Signs, MD;  Location: [facility];  Service: General;  Laterality: Right;   INNER EAR SURGERY Left    as a child   MULTIPLE EXTRACTIONS WITH ALVEOLOPLASTY N/A 11/17/2013   Procedure: MULTIPLE EXTRACTION WITH ALVEOLOPLASTY;  Surgeon: Gae Bon, DDS;  Location: [facility];  Service: Oral Surgery;  Laterality: N/A;    Allergies:  Allergies  Allergen Reactions   Darvocet [Propoxyphene N-Acetaminophen] Hives and Itching   Tramadol Hives and Itching   Other Hives    Medications:   Prior to Admission medications   Medication Sig Start Date End Date Taking? Authorizing Provider  acetaminophen (TYLENOL) 500 MG tablet Take 1,000 mg by mouth every 6 (six) hours as needed for mild pain or headache.    [provider]  albuterol (PROVENTIL HFA;VENTOLIN HFA) 108 (90 BASE) MCG/ACT inhaler Inhale into the lungs every 6 (six) hours as needed for wheezing or shortness of breath.    [provider]  buprenorphine-naloxone (SUBOXONE) 2-0.5 mg SUBL SL tablet Place 1 tablet under the tongue 2 (two) times daily.    [provider]  gabapentin (NEURONTIN) 300 MG capsule Take 900 mg by mouth 4 (four) times daily.     [provider]  gabapentin (NEURONTIN) 300 MG capsule Take 2 capsules (600 mg total) by mouth 3 (three) times daily. 11/12/17   Trula Slade, DPM  ibuprofen (ADVIL,MOTRIN) 600 MG tablet Take 1 tablet (600 mg total) by mouth 4 (four) times daily. 06/27/17   Lily Kocher, PA-C  sertraline (ZOLOFT) 50 MG tablet Take 25 mg by mouth daily.     [provider]  tiZANidine (ZANAFLEX) 4 MG tablet Take 4 mg by mouth every 6 (six) hours as needed for  muscle spasms.    [provider]    Discontinued Meds:  There are no discontinued medications.  Social History:  reports that he has been smoking cigarettes. He has a 26.00 pack-year smoking history. His smokeless tobacco use includes snuff. He reports current alcohol use. He reports that he does not use drugs.  Family History:  History reviewed. No pertinent family history.  Blood pressure 122/71, pulse 98, temperature 98.3 F (36.8 C), temperature source Oral, resp. rate 17, height 6\' 2"  (1.88 m), weight (!) 149 kg, SpO2 95 %. General appearance: alert and cooperative Head: Normocephalic, without obvious abnormality Eyes: negative Back: no flank tenderness Resp: clear to auscultation bilaterally Cardio: regular rate and rhythm GI: soft, non-tender; bowel sounds normal; no masses,  no organomegaly Extremities: edema tr Pulses: 2+ and symmetric Skin: Excoriations Neuro: RLE weakness, asterixis  GU: foley to gravity       Dwana Melena, MD 12/03/2022, 3:44 AM

## 2022-12-03 NOTE — ED Notes (Signed)
Family updated as to patient's status.

## 2022-12-03 NOTE — Assessment & Plan Note (Signed)
-  Continue Neurontin and adjusted dose -Minimize the use of muscle relaxants medications especially baclofen with ongoing renal impairment.

## 2022-12-03 NOTE — Assessment & Plan Note (Signed)
-   his is likely prerenal due to severe dehydration as well as obstructive uropathy and the use of nephrotoxic agents (NSAIDs). -At time of admission BUN about 300 and creatinine more than 20. -After Foley catheter placed and fluid resuscitation provided patient has been able to out to diurese over 7 L with improvement in his BUN and creatinine level -Potassium that was slightly elevated is now within the normal range at 5.1. -Following nephrology service recommendations there is no acute indication for dialysis at this point -Continue to maintain adequate hydration, minimize nephrotoxic agents and follow renal function trend -BUN 118, creatinine 3.41 and a GFR of 22. -There is a component of metabolic acidosis in the setting of acute renal failure. -Continue to follow clinical response.

## 2022-12-03 NOTE — Assessment & Plan Note (Signed)
Will continue his Abilify and antidepressant therapy.

## 2022-12-03 NOTE — Progress Notes (Signed)
   Nephrology Nursing Note:   Pt still awaiting transfer to [facility].  Case discussed with Dr. Marval Regal as we have HD orders in hand but no HD access.  Telephone order received to discontinue today's HD orders.  We will monitor pt overnight and f/u in the morning.  Primary ED nurse was notified.   Rockwell Alexandria, RN [facility]

## 2022-12-03 NOTE — ED Notes (Signed)
Called [facility] regarding pt's suboxone

## 2022-12-03 NOTE — Assessment & Plan Note (Addendum)
-  This includes cocaine and likely opiates. -Continue the use of Suboxone -Will be judicious with additional analgesics medications provided -Cessation counseling provided -Outpatient follow-up with pain clinic encouraged.

## 2022-12-03 NOTE — Progress Notes (Signed)
Pharmacy Antibiotic Note  Hunter Reyes is a 46 y.o. male admitted on 12/02/2022 with sepsis.  Pharmacy has been consulted for vancomycin and cefepime dosing.  Pt has AKI, unable to pass urine in 72+ hours, concern for progression to ESRD w/ plan for HD at least temporarily.  Plan: Vancomycin 2000mg  IV x1; monitor Scr vs HD +/- vanc level for further dosing. Cefepime 2g IV x1 followed by 1g IV Q24H.  Height: 6\' 2"  (188 cm) Weight: (!) 149 kg (328 lb 7.8 oz) IBW/kg (Calculated) : 82.2  Temp (24hrs), Avg:98.1 F (36.7 C), Min:97.9 F (36.6 C), Max:98.3 F (36.8 C)  Recent Labs  Lab 12/03/22 0007  WBC 32.2*  CREATININE 20.99*  LATICACIDVEN 1.2    Estimated Creatinine Clearance: 6.8 mL/min (A) (by C-G formula based on SCr of 20.99 mg/dL (H)).    Allergies  Allergen Reactions   Darvocet [Propoxyphene N-Acetaminophen] Hives and Itching   Tramadol Hives and Itching   Other Hives    Thank you for allowing pharmacy to be a part of this patient's care.  Wynona Neat, PharmD, BCPS  12/03/2022 5:03 AM

## 2022-12-03 NOTE — Progress Notes (Signed)
Patient seen and examined; admitted after midnight secondary to back pain, worsening renal function, diskitis and urinary retention. Patient symptoms has been present and worsening of the last 4-5 months apparently. Currently Afebrile and hemodynamically stable. Please refer to H&P written by Dr. Sidney Ace on 12/03/2022 for further info/details on admission.  Plan: -ID consultation and rec's once arrival to [facility] -continue to follow rec's by nephrology and urology service, patient progressing to needing HD. -continue current antibiotics. -continue supportive care, analgesics and adequate fluid resuscitation.  Barton Dubois MD [phone]

## 2022-12-03 NOTE — H&P (Signed)
[place]   PATIENT NAME: Hunter Reyes    MR#:  [id number]  DATE OF BIRTH:  05-22-1976  DATE OF ADMISSION:  12/02/2022  PRIMARY CARE PHYSICIAN: Celene Squibb, MD   Patient is coming from: Home  REQUESTING/REFERRING PHYSICIAN: Orpah Greek, MD  CHIEF COMPLAINT:   Chief Complaint  Patient presents with   Urinary Retention   Back Pain    HISTORY OF PRESENT ILLNESS:  Hunter Reyes is a 46 y.o. Caucasian male with medical history significant for bipolar 1 disorder, borderline personality disorder, COPD, crack cocaine abuse, depression, peripheral neuropathy, history of right foot osteomyelitis, panic attack and history of seizures, who presented to the emergency room with acute onset of altered mental status with associated urinary retention and low back pain.  The patient has not urinated for 3 days.  He admits to dysuria and was initially having urinary frequency and urgency.  He was found by EMS covered with an 6 and feces.  He stated that he has been using Motrin for back pain on a regular basis over the last 3 months.  He has been taking up to 600 mg possibly more than once a day and 2-3 times a week.  He denies any nausea or vomiting or diarrhea.  No fever or chills.  No chest pain or palpitations.  ED Course: When he came to the ER, vital signs were normal.  Labs revealed hyponatremia and chloremia, hyperkalemia 5.8, BUN more than 300 and creatinine 20.99 with calcium of 8.8 anion gap of 26, albumin of 2.7 and total protein of 8.9.  Serum lipase was 222.  Lactic acid was 1.2 and CK 40.  CBC showed leukocytosis of 32.2 with neutrophilia and thrombocytosis of 411 with mild anemia with hemoglobin of 12.8 hematocrit 27.1.  Previous BUN and creatinine were 8 and 0.95 however on 09/28/2017. EKG as reviewed by me : EKG showed sinus tachycardia with a rate of 108 with borderline left axis deviation and borderline prolonged QT interval with QTc of 492 MS Imaging:  Portable chest ray showed low lung volumes with atelectasis at the lung bases.   CT renal stone showed the following: 1. Interval development of infiltrative change within the retroperitoneum surrounding the abdominal aorta and inferior vena cava as well as the vertebral column. This tracks inferiorly along the great vessels to the level of L4-5 just distal to the inferior vena caval confluence. Additionally, in this region, previously noted vacuum disc phenomena at L2-3 and L3-4 is no longer visualized and the findings may relate to an acute inflammatory process such as diskitis. Correlation with clinical examination is recommended. Contrast enhanced lumbar MRI examination may be more helpful for further evaluation. 2. Moderate bilateral hydronephrosis and hydroureter, improved since prior examination though not yet completely resolved. Stable mild asymmetric right perinephric and periureteric stranding, possibly related to ascending infection. No perinephric fluid collections identified. 3. Interval Foley catheter placement with the balloon within the decompressed bladder lumen. Bladder wall is circumferentially thickened suggesting changes of underlying chronic bladder outlet obstruction. 4. Stable mild splenomegaly. 5. Partial right lower lobe collapse. Trace right pleural effusion, new since prior exam. 6.  Aortic Atherosclerosis.  The patient was given 1 L bolus of IV lactated Ringer, 4 mg of IV Zofran and 2 g of IV pain.  He will be admitted to a progressive unit bed for further evaluation and management.  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[place])    Neuropathy    Non-restorable tooth 10/2013   multiple   Obesity    Osteomyelitis ([place]) 2014   right foot   Panic attacks    Peripheral neuropathy     PAST SURGICAL HISTORY:   Past Surgical History:  Procedure Laterality Date   AMPUTATION     left middle toe   BREAST BIOPSY Right 07/12/2017   Procedure: BREAST BIOPSY;  Surgeon: Aviva Signs, MD;  Location: [facility];  Service: General;  Laterality: Right;   INNER EAR SURGERY Left    as a child   MULTIPLE EXTRACTIONS WITH ALVEOLOPLASTY N/A 11/17/2013   Procedure: MULTIPLE EXTRACTION WITH ALVEOLOPLASTY;  Surgeon: Gae Bon, DDS;  Location: [facility];  Service: Oral Surgery;  Laterality: N/A;    SOCIAL HISTORY:   Social History   Tobacco Use   Smoking status: Every Day    Packs/day: 1.00    Years: 26.00    Total pack years: 26.00    Types: Cigarettes   Smokeless tobacco: Current    Types: Snuff  Substance Use Topics   Alcohol use: Yes    Comment: occasionally    FAMILY HISTORY:  History reviewed. No pertinent family history.  DRUG ALLERGIES:   Allergies  Allergen Reactions   Darvocet [Propoxyphene N-Acetaminophen] Hives and Itching   Tramadol Hives and Itching   Other Hives    REVIEW OF SYSTEMS:   ROS As per history of present illness. All pertinent systems were reviewed above. Constitutional, HEENT, cardiovascular, respiratory, GI, GU, musculoskeletal, neuro, psychiatric, endocrine, integumentary and hematologic systems were reviewed and are otherwise negative/unremarkable except for positive findings mentioned above in the HPI.   MEDICATIONS AT HOME:   Prior to Admission medications   Medication Sig Start Date End Date Taking? Authorizing Provider  acetaminophen (TYLENOL) 500 MG tablet Take 1,000 mg by mouth every 6 (six) hours as needed for mild pain or  headache.    [provider]  albuterol (PROVENTIL HFA;VENTOLIN HFA) 108 (90 BASE) MCG/ACT inhaler Inhale into the lungs every 6 (six) hours as needed for wheezing or shortness of breath.    [provider]  buprenorphine-naloxone (SUBOXONE) 2-0.5 mg SUBL SL tablet Place 1 tablet under the tongue 2 (two) times daily.    [provider]  gabapentin (NEURONTIN) 300 MG capsule Take 900 mg by mouth 4 (four) times daily.     [provider]  gabapentin (NEURONTIN) 300 MG capsule Take 2 capsules (600 mg total) by mouth 3 (three) times daily. 11/12/17   Trula Slade, DPM  ibuprofen (ADVIL,MOTRIN) 600 MG tablet Take 1 tablet (600 mg total) by mouth 4 (four) times daily. 06/27/17   Lily Kocher, PA-C  sertraline (ZOLOFT) 50 MG tablet Take 25 mg by mouth daily.     [provider]  tiZANidine (ZANAFLEX) 4 MG tablet Take 4 mg by mouth every 6 (six) hours as needed for muscle spasms.    [provider]      VITAL SIGNS:  Blood pressure 122/71, pulse 98, temperature 97.9 F (36.6 C), temperature source Oral, resp. rate 19, height 6\' 2"  (1.88 m), weight (!) 149 kg, SpO2 95 %.  PHYSICAL EXAMINATION:  Physical Exam  GENERAL:  46 y.o.-year-old Caucasian male patient lying in the bed with no acute distress.  He was fairly lethargic and confused but cooperative with exam. EYES: Pupils equal, round, reactive to light and accommodation. No scleral icterus. Extraocular muscles intact.  HEENT: Head atraumatic, normocephalic. Oropharynx and nasopharynx clear.  NECK:  Supple, no jugular venous distention. No thyroid enlargement, no tenderness.  LUNGS: Normal breath sounds bilaterally, no wheezing, rales,rhonchi or crepitation. No use of accessory muscles of respiration.  CARDIOVASCULAR: Regular rate and rhythm, S1, S2 normal. No murmurs, rubs, or gallops.  ABDOMEN: Soft, nondistended, nontender. Bowel sounds present. No organomegaly or mass.  He had mild  bilateral CVA tenderness. Musculoskeletal: He had tenderness at L2-L5. EXTREMITIES: No pedal edema, cyanosis, or clubbing.  NEUROLOGIC: Cranial nerves II through XII are intact. Muscle strength 5/5 in all extremities. Sensation intact. Gait not checked.  PSYCHIATRIC: The patient is lethargic and confused.   SKIN: No open wounds or lesions.  He had scattered petechial-like eruption. LABORATORY PANEL:   CBC Recent Labs  Lab 12/03/22 0500  WBC 33.8*  HGB 13.1  HCT 38.4*  PLT 446*   ------------------------------------------------------------------------------------------------------------------  Chemistries  Recent Labs  Lab 12/03/22 0007  NA 129*  K 5.8*  CL 92*  CO2 11*  GLUCOSE 95  BUN >300*  CREATININE 20.99*  CALCIUM 8.8*  AST 17  ALT 15  ALKPHOS 99  BILITOT 1.0   ------------------------------------------------------------------------------------------------------------------  Cardiac Enzymes No results for input(s): "TROPONINI" in the last 168 hours. ------------------------------------------------------------------------------------------------------------------  RADIOLOGY:  CT RENAL STONE STUDY  Result Date: 12/03/2022 CLINICAL DATA:  Back pain, urinary retention. Bladder-neck obstruction EXAM: CT ABDOMEN AND PELVIS WITHOUT CONTRAST TECHNIQUE: Multidetector CT imaging of the abdomen and pelvis was performed following the standard protocol without IV contrast. RADIATION DOSE REDUCTION: This exam was performed according to the departmental dose-optimization program which includes automated exposure control, adjustment of the mA and/or kV according to patient size and/or use of iterative reconstruction technique. COMPARISON:  11/25/2022 FINDINGS: Lower chest: Partial right lower lobe collapse. Trace right pleural effusion, new since prior exam. Cardiac size within normal limits. Hepatobiliary: No focal liver abnormality is seen. No gallstones, gallbladder wall  thickening, or biliary dilatation. Pancreas: Unremarkable Spleen: Stable mild splenomegaly with the spleen measuring 15.5 cm in greatest dimension. No intrasplenic lesion seen. Adrenals/Urinary Tract: The adrenal glands are unremarkable. The kidneys are normal in size and position. Moderate bilateral hydronephrosis and hydroureter persists, but is improved since prior examination. Stable mild asymmetric right perinephric and periureteric stranding, possibly related to ascending infection. No perinephric fluid collections are identified. No intrarenal or ureteral calculi are seen. Interval Foley catheter placement with the balloon noted within the decompressed bladder lumen. Bladder wall is circumferentially thickened suggesting changes of underlying chronic bladder outlet obstruction. Stomach/Bowel: Stomach is within normal limits. Appendix appears normal. No evidence of bowel wall thickening, distention, or inflammatory changes. Vascular/Lymphatic: Mild aortoiliac atherosclerotic calcification. No aortic aneurysm. Since the prior examination, there has developed infiltrative change within the retroperitoneum surrounding the abdominal aorta and inferior vena cava as well as the vertebral column best appreciated on axial image # 50/2. This infiltrative change tracks inferiorly along the great vessels to the level of L4-5 just distal to the inferior vena  caval confluence. No loculated paraspinal fluid collections are identified. No pathologic adenopathy is seen. Reproductive: The prostate gland is mildly enlarged. Other: Small bilateral fat containing inguinal hernias are present. No abdominopelvic ascites. Musculoskeletal: Degenerative changes are again seen throughout the lumbar spine. However, previously noted vacuum disc phenomena at L2-3 and L3-4 is no longer visualized. Additionally, this is in the region of new paravertebral and periaortic infiltrative change which may relate to local inflammatory change as can  be seen in early changes of discitis. There is no associated osseous erosion or paravertebral fluid collection identified. No acute bone abnormality. IMPRESSION: 1. Interval development of infiltrative change within the retroperitoneum surrounding the abdominal aorta and inferior vena cava as well as the vertebral column. This tracks inferiorly along the great vessels to the level of L4-5 just distal to the inferior vena caval confluence. Additionally, in this region, previously noted vacuum disc phenomena at L2-3 and L3-4 is no longer visualized and the findings may relate to an acute inflammatory process such as diskitis. Correlation with clinical examination is recommended. Contrast enhanced lumbar MRI examination may be more helpful for further evaluation. 2. Moderate bilateral hydronephrosis and hydroureter, improved since prior examination though not yet completely resolved. Stable mild asymmetric right perinephric and periureteric stranding, possibly related to ascending infection. No perinephric fluid collections identified. 3. Interval Foley catheter placement with the balloon within the decompressed bladder lumen. Bladder wall is circumferentially thickened suggesting changes of underlying chronic bladder outlet obstruction. 4. Stable mild splenomegaly. 5. Partial right lower lobe collapse. Trace right pleural effusion, new since prior exam. 6.  Aortic Atherosclerosis (ICD10-I70.0). Electronically Signed   By: Fidela Salisbury M.D.   On: 12/03/2022 03:03   DG Chest Port 1 View  Result Date: 12/02/2022 CLINICAL DATA:  Shortness of breath, back pain, urinary retention. History of COPD. EXAM: PORTABLE CHEST 1 VIEW COMPARISON:  09/28/2017. FINDINGS: The heart size and mediastinal contours are within normal limits. Lung volumes are low with atelectasis at the lung bases. No effusion or pneumothorax. No acute osseous abnormality. IMPRESSION: Low lung volumes with atelectasis at the lung bases. Electronically  Signed   By: Brett Fairy M.D.   On: 12/02/2022 23:40      IMPRESSION AND PLAN:  Assessment and Plan: * AKI (acute kidney injury) ([place]) - This is likely prerenal due to severe dehydration as well as obstructive. - The patient will be admitted to a progressive unit bed. - The patient will be aggressively hydrated with IV normal saline. - We will follow serial BMPs. - Nephrology consult was requested. - The patient was seen by Dr. Augustin Coupe in the ER. - She will be [facility] to [facility] and followed by urology group there. - When a Foley catheter placed and had 4 L of urine output. - She will need to see urology as well given his urinary retention. - He will need a temporary catheter to initiate dialysis and transition to tunneled 1 if there are noted times of improvement, concerning for progression to ESRD especially with history of dysuria and obstructive symptoms for 6 months..  Diskitis - We will place him on broad-spectrum antibiotic therapy with IV vancomycin, cefepime and Flagyl. - We will follow blood cultures. - He will need ID consult at [facility].  Peripheral neuropathy - His Neurontin will be continued.  Bipolar 1 disorder ([place]) Will continue his Abilify and antidepressant therapy.  Polysubstance abuse ([place]) - This includes cocaine and likely opiates. - We will continue Suboxone. - Urine drug screen  will be continued.    DVT prophylaxis: Lovenox.  Advanced Care Planning:  Code Status: full code.  Family Communication:  The plan of care was discussed in details with the patient (and family). I answered all questions. The patient agreed to proceed with the above mentioned plan. Further management will depend upon hospital course. Disposition Plan: Back to previous home environment Consults called: Nephrology consult.  He was seen by Dr. Augustin Coupe in the ER.  Nephrology will follow. All the records are reviewed and case discussed with ED provider.  Status is: Inpatient   At the time  of the admission, it appears that the appropriate admission status for this patient is inpatient.  This is judged to be reasonable and necessary in order to provide the required intensity of service to ensure the patient's safety given the presenting symptoms, physical exam findings and initial radiographic and laboratory data in the context of comorbid conditions.  The patient requires inpatient status due to high intensity of service, high risk of further deterioration and high frequency of surveillance required.  I certify that at the time of admission, it is my clinical judgment that the patient will require inpatient hospital care extending more than 2 midnights.                            Dispo: The patient is from: Home              Anticipated d/c is to: Home              Patient currently is not medically stable to d/c.              Difficult to place patient: No  [name] M.D on 12/03/2022 at 5:41 AM  [facility]   From 7 PM-7 AM, contact night-coverage [email or web address]  CC: Primary care physician; Celene Squibb, MD

## 2022-12-03 NOTE — Assessment & Plan Note (Addendum)
-   We will place him on broad-spectrum antibiotic therapy with IV vancomycin, cefepime and Flagyl. - We will follow blood cultures. - He will need ID consult at [facility].

## 2022-12-04 DIAGNOSIS — E872 Acidosis, unspecified: Secondary | ICD-10-CM

## 2022-12-04 DIAGNOSIS — G6289 Other specified polyneuropathies: Secondary | ICD-10-CM

## 2022-12-04 DIAGNOSIS — F319 Bipolar disorder, unspecified: Secondary | ICD-10-CM | POA: Diagnosis not present

## 2022-12-04 DIAGNOSIS — M4640 Discitis, unspecified, site unspecified: Secondary | ICD-10-CM | POA: Diagnosis not present

## 2022-12-04 DIAGNOSIS — N179 Acute kidney failure, unspecified: Secondary | ICD-10-CM | POA: Diagnosis not present

## 2022-12-04 LAB — RENAL FUNCTION PANEL
Albumin: 2.4 g/dL — ABNORMAL LOW (ref 3.5–5.0)
Anion gap: 11 (ref 5–15)
BUN: 118 mg/dL — ABNORMAL HIGH (ref 6–20)
CO2: 15 mmol/L — ABNORMAL LOW (ref 22–32)
Calcium: 8.9 mg/dL (ref 8.9–10.3)
Chloride: 122 mmol/L — ABNORMAL HIGH (ref 98–111)
Creatinine, Ser: 3.41 mg/dL — ABNORMAL HIGH (ref 0.61–1.24)
GFR, Estimated: 22 mL/min — ABNORMAL LOW (ref >60)
Glucose, Bld: 110 mg/dL — ABNORMAL HIGH (ref 70–99)
Phosphorus: 5.7 mg/dL — ABNORMAL HIGH (ref 2.5–4.6)
Potassium: 5.1 mmol/L (ref 3.5–5.1)
Sodium: 148 mmol/L — ABNORMAL HIGH (ref 135–145)

## 2022-12-04 LAB — HEPATITIS B SURFACE ANTIGEN

## 2022-12-04 LAB — HEPATITIS B SURFACE ANTIBODY, QUANTITATIVE: Hep B S AB Quant (Post): <3.1 m[IU]/mL — ABNORMAL LOW (ref >9.9)

## 2022-12-04 MED ORDER — SODIUM CHLORIDE 0.9 % IV SOLN
2.0000 g | Freq: Two times a day (BID) | INTRAVENOUS | Status: DC
  Administered 2022-12-04: 2 g via INTRAVENOUS
  Filled 2022-12-04: qty 12.5

## 2022-12-04 MED ORDER — GABAPENTIN 300 MG PO CAPS
600.0000 mg | ORAL_CAPSULE | Freq: Two times a day (BID) | ORAL | Status: DC
  Administered 2022-12-04 – 2022-12-15 (×22): 600 mg via ORAL
  Filled 2022-12-04 (×22): qty 2

## 2022-12-04 MED ORDER — ENOXAPARIN SODIUM 40 MG/0.4ML IJ SOSY
40.0000 mg | PREFILLED_SYRINGE | INTRAMUSCULAR | Status: DC
  Filled 2022-12-04: qty 0.4

## 2022-12-04 NOTE — Assessment & Plan Note (Signed)
-  In the setting of acute renal failure -Continue to maintain adequate hydration -Continue to follow bicarb levels -Per nephrology okay to hold on sodium bicarbonate currently.

## 2022-12-04 NOTE — Progress Notes (Signed)
Patient ID: Hunter Reyes, male   DOB: Jun 22, 1976, 46 y.o.   MRN: [id number] S: Feeling better but wants some water O:BP 129/69   Pulse (!) 103   Temp 98.1 F (36.7 C)   Resp 16   Ht 6\' 2"  (1.88 m)   Wt (!) 149 kg   SpO2 95%   BMI 42.18 kg/m   Intake/Output Summary (Last 24 hours) at 12/04/2022 1035 Last data filed at 12/04/2022 2025 Gross per 24 hour  Intake 4185.28 ml  Output 7525 ml  Net -3339.72 ml   Intake/Output: I/O last 3 completed shifts: In: 1400 [IV Piggyback:1400] Out: 42706 [Urine:15630]  Intake/Output this shift:  Total I/O In: 3985.3 [I.V.:3787.1; IV Piggyback:198.2] Out: 525 [Urine:525] Weight change:  Gen: NAD CVS: tachy at 103, no rub Resp:CTA Abd: +BS, soft, NT/ND Ext: no edema  Recent Labs  Lab 12/03/22 0007 12/03/22 0553  NA 129* 133*  K 5.8* 5.0  CL 92* 104  CO2 11* 11*  GLUCOSE 95 105*  BUN >300* 184*  CREATININE 20.99* 14.78*  ALBUMIN 2.7*  --   CALCIUM 8.8* 8.7*  AST 17  --   ALT 15  --    Liver Function Tests: Recent Labs  Lab 12/03/22 0007  AST 17  ALT 15  ALKPHOS 99  BILITOT 1.0  PROT 8.9*  ALBUMIN 2.7*   Recent Labs  Lab 12/03/22 0007  LIPASE 222*   No results for input(s): "AMMONIA" in the last 168 hours. CBC: Recent Labs  Lab 12/03/22 0007 12/03/22 0500  WBC 32.2* 33.8*  NEUTROABS 27.0*  --   HGB 12.8* 13.1  HCT 37.1* 38.4*  MCV 82.8 84.2  PLT 411* 446*   Cardiac Enzymes: Recent Labs  Lab 12/03/22 0007  CKTOTAL 40*   CBG: No results for input(s): "GLUCAP" in the last 168 hours.  Iron Studies: No results for input(s): "IRON", "TIBC", "TRANSFERRIN", "FERRITIN" in the last 72 hours. Studies/Results: CT RENAL STONE STUDY  Result Date: 12/03/2022 CLINICAL DATA:  Back pain, urinary retention. Bladder-neck obstruction EXAM: CT ABDOMEN AND PELVIS WITHOUT CONTRAST TECHNIQUE: Multidetector CT imaging of the abdomen and pelvis was performed following the standard protocol without IV contrast. RADIATION  DOSE REDUCTION: This exam was performed according to the departmental dose-optimization program which includes automated exposure control, adjustment of the mA and/or kV according to patient size and/or use of iterative reconstruction technique. COMPARISON:  11/25/2022 FINDINGS: Lower chest: Partial right lower lobe collapse. Trace right pleural effusion, new since prior exam. Cardiac size within normal limits. Hepatobiliary: No focal liver abnormality is seen. No gallstones, gallbladder wall thickening, or biliary dilatation. Pancreas: Unremarkable Spleen: Stable mild splenomegaly with the spleen measuring 15.5 cm in greatest dimension. No intrasplenic lesion seen. Adrenals/Urinary Tract: The adrenal glands are unremarkable. The kidneys are normal in size and position. Moderate bilateral hydronephrosis and hydroureter persists, but is improved since prior examination. Stable mild asymmetric right perinephric and periureteric stranding, possibly related to ascending infection. No perinephric fluid collections are identified. No intrarenal or ureteral calculi are seen. Interval Foley catheter placement with the balloon noted within the decompressed bladder lumen. Bladder wall is circumferentially thickened suggesting changes of underlying chronic bladder outlet obstruction. Stomach/Bowel: Stomach is within normal limits. Appendix appears normal. No evidence of bowel wall thickening, distention, or inflammatory changes. Vascular/Lymphatic: Mild aortoiliac atherosclerotic calcification. No aortic aneurysm. Since the prior examination, there has developed infiltrative change within the retroperitoneum surrounding the abdominal aorta and inferior vena cava as well as the vertebral column  best appreciated on axial image # 50/2. This infiltrative change tracks inferiorly along the great vessels to the level of L4-5 just distal to the inferior vena caval confluence. No loculated paraspinal fluid collections are identified.  No pathologic adenopathy is seen. Reproductive: The prostate gland is mildly enlarged. Other: Small bilateral fat containing inguinal hernias are present. No abdominopelvic ascites. Musculoskeletal: Degenerative changes are again seen throughout the lumbar spine. However, previously noted vacuum disc phenomena at L2-3 and L3-4 is no longer visualized. Additionally, this is in the region of new paravertebral and periaortic infiltrative change which may relate to local inflammatory change as can be seen in early changes of discitis. There is no associated osseous erosion or paravertebral fluid collection identified. No acute bone abnormality. IMPRESSION: 1. Interval development of infiltrative change within the retroperitoneum surrounding the abdominal aorta and inferior vena cava as well as the vertebral column. This tracks inferiorly along the great vessels to the level of L4-5 just distal to the inferior vena caval confluence. Additionally, in this region, previously noted vacuum disc phenomena at L2-3 and L3-4 is no longer visualized and the findings may relate to an acute inflammatory process such as diskitis. Correlation with clinical examination is recommended. Contrast enhanced lumbar MRI examination may be more helpful for further evaluation. 2. Moderate bilateral hydronephrosis and hydroureter, improved since prior examination though not yet completely resolved. Stable mild asymmetric right perinephric and periureteric stranding, possibly related to ascending infection. No perinephric fluid collections identified. 3. Interval Foley catheter placement with the balloon within the decompressed bladder lumen. Bladder wall is circumferentially thickened suggesting changes of underlying chronic bladder outlet obstruction. 4. Stable mild splenomegaly. 5. Partial right lower lobe collapse. Trace right pleural effusion, new since prior exam. 6.  Aortic Atherosclerosis (ICD10-I70.0). Electronically Signed   By: Fidela Salisbury M.D.   On: 12/03/2022 03:03   DG Chest Port 1 View  Result Date: 12/02/2022 CLINICAL DATA:  Shortness of breath, back pain, urinary retention. History of COPD. EXAM: PORTABLE CHEST 1 VIEW COMPARISON:  09/28/2017. FINDINGS: The heart size and mediastinal contours are within normal limits. Lung volumes are low with atelectasis at the lung bases. No effusion or pneumothorax. No acute osseous abnormality. IMPRESSION: Low lung volumes with atelectasis at the lung bases. Electronically Signed   By: Brett Fairy M.D.   On: 12/02/2022 23:40    buprenorphine-naloxone  1 tablet Sublingual BID   Chlorhexidine Gluconate Cloth  6 each Topical Q0600   enoxaparin (LOVENOX) injection  30 mg Subcutaneous Q24H   gabapentin  600 mg Oral TID   sertraline  25 mg Oral Daily   vancomycin variable dose per unstable renal function (pharmacist dosing)   Does not apply See admin instructions    BMET    Component Value Date/Time   NA 133 (L) 12/03/2022 0553   K 5.0 12/03/2022 0553   CL 104 12/03/2022 0553   CO2 11 (L) 12/03/2022 0553   GLUCOSE 105 (H) 12/03/2022 0553   BUN 184 (H) 12/03/2022 0553   CREATININE 14.78 (H) 12/03/2022 0553   CREATININE 0.97 02/06/2016 1458   CALCIUM 8.7 (L) 12/03/2022 0553   GFRNONAA 4 (L) 12/03/2022 0553   GFRAA >60 09/28/2017 1344   CBC    Component Value Date/Time   WBC 33.8 (H) 12/03/2022 0500   RBC 4.56 12/03/2022 0500   HGB 13.1 12/03/2022 0500   HCT 38.4 (L) 12/03/2022 0500   PLT 446 (H) 12/03/2022 0500   MCV 84.2 12/03/2022 0500  MCH 28.7 12/03/2022 0500   MCHC 34.1 12/03/2022 0500   RDW 15.5 12/03/2022 0500   LYMPHSABS 1.0 12/03/2022 0007   MONOABS 4.2 (H) 12/03/2022 0007   EOSABS 0.0 12/03/2022 0007   BASOSABS 0.0 12/03/2022 0007     Assessment/Plan:  AKI- due to obstructive uropathy.  After foley catheter placed, he had significant diuresis of 7 liters over the past 24 hours.  BUN/Cr improving, however awaiting on labs from this morning.  No  indication for dialysis at this time and will continue to follow UOP and daily BUN/Cr.   Avoid nephrotoxic medications including NSAIDs and iodinated intravenous contrast exposure unless the latter is absolutely indicated.   Preferred narcotic agents for pain control are hydromorphone, fentanyl, and methadone. Morphine should not be used.  Avoid Baclofen and avoid oral sodium phosphate and magnesium citrate based laxatives / bowel preps.  Continue strict Input and Output monitoring. Will monitor the patient closely with you and intervene or adjust therapy as indicated by changes in clinical status/labs  Hyperkalemia - due to AKI, improving  Metabolic acidosis - due to AKI. Await repeat labs. Diskitis - started on abx per primary svc.  Awaiting ID consult at [facility] Urinary retention - likely due to diskitis.  Continue with foley catheter. Bipolar disorder - continue with home meds  Donetta Potts, MD [facility]

## 2022-12-04 NOTE — ED Notes (Signed)
Pt took O2 off; O2 sat at 86%.  RN placed [place] back on pt. O2 now at 93%

## 2022-12-04 NOTE — ED Notes (Signed)
Pt has removed [place] for about an hour without desats below 93%. Pt on room air

## 2022-12-04 NOTE — ED Notes (Signed)
RT called regarding hooking pt's O2 to water for pt's comfort

## 2022-12-04 NOTE — ED Notes (Signed)
Pt's O2 hooked up to water bottle

## 2022-12-04 NOTE — ED Notes (Signed)
Pt keeps removing O2 off and pt will desat to 88-91% When placed back on NC4L O2 sat will increase to 93-97%

## 2022-12-04 NOTE — Progress Notes (Signed)
  Transition of Care [facility]) Screening Note   Patient Details  Name: Hunter Reyes Date of Birth: 11/30/1976   Transition of Care [facility]) CM/SW Contact:    Iona Beard, [place] Phone Number: 12/04/2022, 11:46 AM    Transition of Care Department [facility]) has reviewed patient and no TOC needs have been identified at this time. We will continue to monitor patient advancement through interdisciplinary progression rounds. If new patient transition needs arise, please place a TOC consult.

## 2022-12-04 NOTE — ED Notes (Signed)
Gave update to pt's brother Delfino Lovett with verbal consent from pt.

## 2022-12-04 NOTE — ED Notes (Signed)
Critical lab was positive blood culture in the aerobic bottle as gram-positive cocci. [facility] at [facility] notified.

## 2022-12-04 NOTE — Progress Notes (Signed)
Progress Note   Patient: Hunter Reyes GHW:[id number] DOB: [birth year] DOA: 12/02/2022     1 DOS: the patient was seen and examined on 12/04/2022   Brief hospital course: As per H&P written by Dr. Sidney Ace on 12/03/22 Hunter Reyes is a 46 y.o. Caucasian male with medical history significant for bipolar 1 disorder, borderline personality disorder, COPD, crack cocaine abuse, depression, peripheral neuropathy, history of right foot osteomyelitis, panic attack and history of seizures, who presented to the emergency room with acute onset of altered mental status with associated urinary retention and low back pain.  The patient has not urinated for 3 days.  He admits to dysuria and was initially having urinary frequency and urgency.  He was found by EMS covered with an 6 and feces.  He stated that he has been using Motrin for back pain on a regular basis over the last 3 months.  He has been taking up to 600 mg possibly more than once a day and 2-3 times a week.  He denies any nausea or vomiting or diarrhea.  No fever or chills.  No chest pain or palpitations.   ED Course: When he came to the ER, vital signs were normal.  Labs revealed hyponatremia and chloremia, hyperkalemia 5.8, BUN more than 300 and creatinine 20.99 with calcium of 8.8 anion gap of 26, albumin of 2.7 and total protein of 8.9.  Serum lipase was 222.  Lactic acid was 1.2 and CK 40.  CBC showed leukocytosis of 32.2 with neutrophilia and thrombocytosis of 411 with mild anemia with hemoglobin of 12.8 hematocrit 27.1.  Previous BUN and creatinine were 8 and 0.95 however on 09/28/2017. EKG as reviewed by me : EKG showed sinus tachycardia with a rate of 108 with borderline left axis deviation and borderline prolonged QT interval with QTc of 492 MS Imaging: Portable chest ray showed low lung volumes with atelectasis at the lung bases.   CT renal stone showed the following: 1. Interval development of infiltrative change within  the retroperitoneum surrounding the abdominal aorta and inferior vena cava as well as the vertebral column. This tracks inferiorly along the great vessels to the level of L4-5 just distal to the inferior vena caval confluence. Additionally, in this region, previously noted vacuum disc phenomena at L2-3 and L3-4 is no longer visualized and the findings may relate to an acute inflammatory process such as diskitis. Correlation with clinical examination is recommended. Contrast enhanced lumbar MRI examination may be more helpful for further evaluation. 2. Moderate bilateral hydronephrosis and hydroureter, improved since prior examination though not yet completely resolved. Stable mild asymmetric right perinephric and periureteric stranding, possibly related to ascending infection. No perinephric fluid collections identified. 3. Interval Foley catheter placement with the balloon within the decompressed bladder lumen. Bladder wall is circumferentially thickened suggesting changes of underlying chronic bladder outlet obstruction. 4. Stable mild splenomegaly. 5. Partial right lower lobe collapse. Trace right pleural effusion, new since prior exam. 6.  Aortic Atherosclerosis.   The patient was given 1 L bolus of IV lactated Ringer, 4 mg of IV Zofran and 2 g of IV pain.  He will be admitted to a progressive unit bed for further evaluation and management.  Assessment and Plan: * AKI (acute kidney injury) ([place]) - his is likely prerenal due to severe dehydration as well as obstructive uropathy and the use of nephrotoxic agents (NSAIDs). -At time of admission BUN about 300 and creatinine more than 20. -After Foley catheter placed and fluid resuscitation  provided patient has been able to out to diurese over 7 L with improvement in his BUN and creatinine level -Potassium that was slightly elevated is now within the normal range at 5.1. -Following nephrology service recommendations there is no acute  indication for dialysis at this point -Continue to maintain adequate hydration, minimize nephrotoxic agents and follow renal function trend -BUN 118, creatinine 3.41 and a GFR of 22. -There is a component of metabolic acidosis in the setting of acute renal failure. -Continue to follow clinical response.  Diskitis - Continue empirical treatment with broad-spectrum antibiotics -Follow culture results and infectious disease input once patient transferred to [facility].   -He may end up needing recommendation by neurosurgery; as there is concern that his urinary retention has been triggered by ongoing discitis process.  Peripheral neuropathy -Continue Neurontin and adjusted dose -Minimize the use of muscle relaxants medications especially baclofen with ongoing renal impairment.  Bipolar 1 disorder ([place]) -No suicidal ideation or hallucination -Stable mood currently -Continue Abilify-antidepressant therapy.  Polysubstance abuse ([place]) -This includes cocaine and likely opiates. -Continue the use of Suboxone -Will be judicious with additional analgesics medications provided -Cessation counseling provided -Outpatient follow-up with pain clinic encouraged.  Metabolic acidosis -In the setting of acute renal failure -Continue to maintain adequate hydration -Continue to follow bicarb levels -Per nephrology okay to hold on sodium bicarbonate currently.   Subjective:  Afebrile, no chest pain, no nausea, no vomiting, no shortness of breath.  While sleeping intermittent desaturation appreciated and corrected with oxygen supplementation.  Patient expressed to be hungry and thirsty.  Over 7 L diuresis after Foley catheter placed and fluid resuscitation provided.   Physical Exam: Vitals:   12/04/22 1400 12/04/22 1515 12/04/22 1606 12/04/22 1606  BP: 130/87  137/83   Pulse: (!) 107 (!) 116 (!) 114   Resp: 16 14 20    Temp:    98.2 F (36.8 C)  TempSrc:    Oral  SpO2: 94% 93% 92%    Weight:      Height:       General exam: Alert, awake, oriented x 3; reports feeling better and complaining of being hungry and thirsty.  Still having ongoing back pain with any movement. Respiratory system: Intermittently using 2-3 L nasal cannula supplementation while sleeping; no using accessory muscles.  Good saturation on room air.  No wheezing or crackles appreciated. Cardiovascular system:RRR.  No rubs, no gallops, no JVD. Gastrointestinal system: Abdomen is obese, nondistended, soft and nontender. No organomegaly or masses felt. Normal bowel sounds heard. Central nervous system: Patient following commands appropriately; demonstrating lower extremity weakness (unknown baseline). Extremities: No cyanosis or clubbing; trace edema appreciated bilaterally. Skin: No petechiae. Psychiatry: Judgement and insight appear normal. Mood & affect appropriate.   Data Reviewed: Renal function panel: Sodium 148, potassium 5.1, chloride 122, bicarb 15, BUN 118, creatinine 3.41, albumin 2.4 and GFR 22. normal calcium level appreciated.  Anion gap 11  Family Communication: No family at bedside.  Disposition: Status is: Inpatient Remains inpatient appropriate because: Needing further evaluation for ongoing discitis and further improvement in his renal failure.   Planned Discharge Destination: Home with Home Health; but will need to follow-up physical therapy recommendations and further patient's improvement prior to finally decide discharge pathway.  Time spent: 40 minutes  Author: Barton Dubois, MD 12/04/2022 5:49 PM  For on call review [email or web address].

## 2022-12-05 ENCOUNTER — Encounter (HOSPITAL_COMMUNITY): Payer: Self-pay | Admitting: Family Medicine

## 2022-12-05 ENCOUNTER — Inpatient Hospital Stay (HOSPITAL_COMMUNITY): Payer: Medicaid Other | Admitting: Anesthesiology

## 2022-12-05 ENCOUNTER — Inpatient Hospital Stay (HOSPITAL_COMMUNITY): Payer: Medicaid Other

## 2022-12-05 ENCOUNTER — Encounter (HOSPITAL_COMMUNITY)
Admission: EM | Disposition: A | Discharge status: Home Health | Payer: Self-pay | Source: Home / Self Care | Attending: Internal Medicine

## 2022-12-05 ENCOUNTER — Other Ambulatory Visit: Payer: Self-pay

## 2022-12-05 DIAGNOSIS — R7881 Bacteremia: Secondary | ICD-10-CM

## 2022-12-05 DIAGNOSIS — G061 Intraspinal abscess and granuloma: Secondary | ICD-10-CM | POA: Diagnosis not present

## 2022-12-05 DIAGNOSIS — F17210 Nicotine dependence, cigarettes, uncomplicated: Secondary | ICD-10-CM

## 2022-12-05 DIAGNOSIS — N179 Acute kidney failure, unspecified: Secondary | ICD-10-CM | POA: Diagnosis not present

## 2022-12-05 DIAGNOSIS — M4647 Discitis, unspecified, lumbosacral region: Secondary | ICD-10-CM | POA: Diagnosis not present

## 2022-12-05 DIAGNOSIS — J449 Chronic obstructive pulmonary disease, unspecified: Secondary | ICD-10-CM | POA: Diagnosis not present

## 2022-12-05 DIAGNOSIS — G062 Extradural and subdural abscess, unspecified: Secondary | ICD-10-CM

## 2022-12-05 DIAGNOSIS — F418 Other specified anxiety disorders: Secondary | ICD-10-CM | POA: Diagnosis not present

## 2022-12-05 DIAGNOSIS — B181 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: Secondary | ICD-10-CM | POA: Diagnosis present

## 2022-12-05 DIAGNOSIS — B9562 Methicillin resistant Staphylococcus aureus infection as the cause of diseases classified elsewhere: Secondary | ICD-10-CM | POA: Diagnosis not present

## 2022-12-05 DIAGNOSIS — M86272 Subacute osteomyelitis, left ankle and foot: Secondary | ICD-10-CM

## 2022-12-05 DIAGNOSIS — G6289 Other specified polyneuropathies: Secondary | ICD-10-CM

## 2022-12-05 DIAGNOSIS — K089 Disorder of teeth and supporting structures, unspecified: Secondary | ICD-10-CM

## 2022-12-05 HISTORY — PX: DECOMPRESSIVE LUMBAR LAMINECTOMY LEVEL 1: SHX5791

## 2022-12-05 LAB — CK: Total CK: 404 U/L — ABNORMAL HIGH (ref 49–397)

## 2022-12-05 LAB — URINE CULTURE: Culture: 20000 — AB

## 2022-12-05 LAB — BLOOD CULTURE ID PANEL (REFLEXED) - BCID2
A.calcoaceticus-baumannii: NOT DETECTED
A.calcoaceticus-baumannii: NOT DETECTED
Bacteroides fragilis: NOT DETECTED
Bacteroides fragilis: NOT DETECTED
Candida albicans: NOT DETECTED
Candida albicans: NOT DETECTED
Candida auris: NOT DETECTED
Candida auris: NOT DETECTED
Candida glabrata: NOT DETECTED
Candida glabrata: NOT DETECTED
Candida krusei: NOT DETECTED
Candida krusei: NOT DETECTED
Candida parapsilosis: NOT DETECTED
Candida parapsilosis: NOT DETECTED
Candida tropicalis: NOT DETECTED
Candida tropicalis: NOT DETECTED
Cryptococcus neoformans/gattii: NOT DETECTED
Cryptococcus neoformans/gattii: NOT DETECTED
Enterobacter cloacae complex: NOT DETECTED
Enterobacter cloacae complex: NOT DETECTED
Enterobacterales: NOT DETECTED
Enterobacterales: NOT DETECTED
Enterococcus Faecium: NOT DETECTED
Enterococcus Faecium: NOT DETECTED
Enterococcus faecalis: NOT DETECTED
Enterococcus faecalis: NOT DETECTED
Escherichia coli: NOT DETECTED
Escherichia coli: NOT DETECTED
Haemophilus influenzae: NOT DETECTED
Haemophilus influenzae: NOT DETECTED
Klebsiella aerogenes: NOT DETECTED
Klebsiella aerogenes: NOT DETECTED
Klebsiella oxytoca: NOT DETECTED
Klebsiella oxytoca: NOT DETECTED
Klebsiella pneumoniae: NOT DETECTED
Klebsiella pneumoniae: NOT DETECTED
Listeria monocytogenes: NOT DETECTED
Listeria monocytogenes: NOT DETECTED
Meth resistant mecA/C and MREJ: DETECTED — AB
Meth resistant mecA/C and MREJ: DETECTED — AB
Neisseria meningitidis: NOT DETECTED
Neisseria meningitidis: NOT DETECTED
Proteus species: NOT DETECTED
Proteus species: NOT DETECTED
Pseudomonas aeruginosa: NOT DETECTED
Pseudomonas aeruginosa: NOT DETECTED
Salmonella species: NOT DETECTED
Salmonella species: NOT DETECTED
Serratia marcescens: NOT DETECTED
Serratia marcescens: NOT DETECTED
Staphylococcus aureus (BCID): DETECTED — AB
Staphylococcus aureus (BCID): DETECTED — AB
Staphylococcus epidermidis: NOT DETECTED
Staphylococcus epidermidis: NOT DETECTED
Staphylococcus lugdunensis: NOT DETECTED
Staphylococcus lugdunensis: NOT DETECTED
Staphylococcus species: DETECTED — AB
Staphylococcus species: DETECTED — AB
Stenotrophomonas maltophilia: NOT DETECTED
Stenotrophomonas maltophilia: NOT DETECTED
Streptococcus agalactiae: NOT DETECTED
Streptococcus agalactiae: NOT DETECTED
Streptococcus pneumoniae: NOT DETECTED
Streptococcus pneumoniae: NOT DETECTED
Streptococcus pyogenes: NOT DETECTED
Streptococcus pyogenes: NOT DETECTED
Streptococcus species: NOT DETECTED
Streptococcus species: NOT DETECTED

## 2022-12-05 LAB — SEDIMENTATION RATE: Sed Rate: 83 mm/hr — ABNORMAL HIGH (ref 0–16)

## 2022-12-05 LAB — C-REACTIVE PROTEIN: CRP: 9.7 mg/dL — ABNORMAL HIGH (ref <1.0)

## 2022-12-05 LAB — URINE DRUGS OF ABUSE SCREEN W ALC, ROUTINE (REF LAB)
Amphetamines, Urine: NEGATIVE ng/mL
Barbiturate, Ur: NEGATIVE ng/mL
Benzodiazepine Quant, Ur: NEGATIVE ng/mL
Cannabinoid Quant, Ur: NEGATIVE ng/mL
Cocaine (Metab.): NEGATIVE ng/mL
Ethanol U, Quan: NEGATIVE %
Methadone Screen, Urine: NEGATIVE ng/mL
Opiate Quant, Ur: NEGATIVE ng/mL
Phencyclidine, Ur: NEGATIVE ng/mL
Propoxyphene, Urine: NEGATIVE ng/mL

## 2022-12-05 LAB — RENAL FUNCTION PANEL
Albumin: 2.3 g/dL — ABNORMAL LOW (ref 3.5–5.0)
Anion gap: 9 (ref 5–15)
BUN: 89 mg/dL — ABNORMAL HIGH (ref 6–20)
CO2: 15 mmol/L — ABNORMAL LOW (ref 22–32)
Calcium: 8.8 mg/dL — ABNORMAL LOW (ref 8.9–10.3)
Chloride: 125 mmol/L — ABNORMAL HIGH (ref 98–111)
Creatinine, Ser: 1.88 mg/dL — ABNORMAL HIGH (ref 0.61–1.24)
GFR, Estimated: 44 mL/min — ABNORMAL LOW (ref >60)
Glucose, Bld: 110 mg/dL — ABNORMAL HIGH (ref 70–99)
Phosphorus: 4.7 mg/dL — ABNORMAL HIGH (ref 2.5–4.6)
Potassium: 5.3 mmol/L — ABNORMAL HIGH (ref 3.5–5.1)
Sodium: 149 mmol/L — ABNORMAL HIGH (ref 135–145)

## 2022-12-05 LAB — TYPE AND SCREEN
ABO/RH(D): O POS
Antibody Screen: NEGATIVE

## 2022-12-05 LAB — ECHOCARDIOGRAM COMPLETE
Area-P 1/2: 3.45 cm2
Height: 74 in
S' Lateral: 3.3 cm
Weight: 5255.77 oz

## 2022-12-05 LAB — VANCOMYCIN, RANDOM: Vancomycin Rm: 7 ug/mL

## 2022-12-05 LAB — ABO/RH: ABO/RH(D): O POS

## 2022-12-05 LAB — HEPATITIS B SURFACE ANTIGEN: Hepatitis B Surface Ag: NONREACTIVE

## 2022-12-05 SURGERY — DECOMPRESSIVE LUMBAR LAMINECTOMY LEVEL 1
Anesthesia: General | Site: Back

## 2022-12-05 MED ORDER — ROCURONIUM BROMIDE 10 MG/ML (PF) SYRINGE
PREFILLED_SYRINGE | INTRAVENOUS | Status: AC
  Filled 2022-12-05: qty 10

## 2022-12-05 MED ORDER — ROCURONIUM BROMIDE 10 MG/ML (PF) SYRINGE
PREFILLED_SYRINGE | INTRAVENOUS | Status: DC | PRN
  Administered 2022-12-05: 40 mg via INTRAVENOUS
  Administered 2022-12-05: 60 mg via INTRAVENOUS
  Administered 2022-12-05: 40 mg via INTRAVENOUS

## 2022-12-05 MED ORDER — FUROSEMIDE 10 MG/ML IJ SOLN
40.0000 mg | Freq: Once | INTRAMUSCULAR | Status: AC
  Administered 2022-12-05: 40 mg via INTRAVENOUS
  Filled 2022-12-05: qty 4

## 2022-12-05 MED ORDER — VANCOMYCIN HCL 2000 MG/400ML IV SOLN
2000.0000 mg | INTRAVENOUS | Status: DC
  Filled 2022-12-05: qty 400

## 2022-12-05 MED ORDER — MEPERIDINE HCL 25 MG/ML IJ SOLN: 6.2500 mg | INTRAMUSCULAR | Status: DC | PRN

## 2022-12-05 MED ORDER — PHENYLEPHRINE 80 MCG/ML (10ML) SYRINGE FOR IV PUSH (FOR BLOOD PRESSURE SUPPORT)
PREFILLED_SYRINGE | INTRAVENOUS | Status: AC
  Filled 2022-12-05: qty 10

## 2022-12-05 MED ORDER — LIDOCAINE 2% (20 MG/ML) 5 ML SYRINGE
INTRAMUSCULAR | Status: AC
  Filled 2022-12-05: qty 5

## 2022-12-05 MED ORDER — DEXAMETHASONE SODIUM PHOSPHATE 10 MG/ML IJ SOLN
INTRAMUSCULAR | Status: DC | PRN
  Administered 2022-12-05: 8 mg via INTRAVENOUS

## 2022-12-05 MED ORDER — PROPOFOL 10 MG/ML IV BOLUS
INTRAVENOUS | Status: AC
  Filled 2022-12-05: qty 20

## 2022-12-05 MED ORDER — DEXAMETHASONE SODIUM PHOSPHATE 10 MG/ML IJ SOLN
INTRAMUSCULAR | Status: AC
  Filled 2022-12-05: qty 1

## 2022-12-05 MED ORDER — OXYCODONE HCL 5 MG/5ML PO SOLN: 5.0000 mg | Freq: Once | ORAL | Status: DC | PRN

## 2022-12-05 MED ORDER — BUPIVACAINE HCL (PF) 0.5 % IJ SOLN
INTRAMUSCULAR | Status: AC
  Filled 2022-12-05: qty 30

## 2022-12-05 MED ORDER — ONDANSETRON HCL 4 MG/2ML IJ SOLN
INTRAMUSCULAR | Status: DC | PRN
  Administered 2022-12-05: 4 mg via INTRAVENOUS

## 2022-12-05 MED ORDER — ORAL CARE MOUTH RINSE: 15.0000 mL | Freq: Once | OROMUCOSAL | Status: AC

## 2022-12-05 MED ORDER — LIDOCAINE 2% (20 MG/ML) 5 ML SYRINGE
INTRAMUSCULAR | Status: DC | PRN
  Administered 2022-12-05: 40 mg via INTRAVENOUS

## 2022-12-05 MED ORDER — DEXTROSE-NACL 5-0.45 % IV SOLN: INTRAVENOUS | Status: DC

## 2022-12-05 MED ORDER — THROMBIN 5000 UNITS EX SOLR
OROMUCOSAL | Status: DC | PRN
  Administered 2022-12-05: 5 mL via TOPICAL

## 2022-12-05 MED ORDER — PROMETHAZINE HCL 25 MG/ML IJ SOLN: 6.2500 mg | INTRAMUSCULAR | Status: DC | PRN

## 2022-12-05 MED ORDER — SUGAMMADEX SODIUM 200 MG/2ML IV SOLN
INTRAVENOUS | Status: DC | PRN
  Administered 2022-12-05: 300 mg via INTRAVENOUS

## 2022-12-05 MED ORDER — OXYCODONE HCL 5 MG PO TABS: 5.0000 mg | ORAL_TABLET | Freq: Once | ORAL | Status: DC | PRN

## 2022-12-05 MED ORDER — LIDOCAINE-EPINEPHRINE 1 %-1:100000 IJ SOLN
INTRAMUSCULAR | Status: AC
  Filled 2022-12-05: qty 1

## 2022-12-05 MED ORDER — LACTATED RINGERS IV SOLN: INTRAVENOUS | Status: DC

## 2022-12-05 MED ORDER — SODIUM CHLORIDE 0.9 % IV SOLN
8.0000 mg/kg | Freq: Every day | INTRAVENOUS | Status: DC
  Administered 2022-12-05 – 2022-12-14 (×10): 850 mg via INTRAVENOUS
  Filled 2022-12-05 (×10): qty 17

## 2022-12-05 MED ORDER — ARTIFICIAL TEARS OPHTHALMIC OINT
TOPICAL_OINTMENT | OPHTHALMIC | Status: AC
  Filled 2022-12-05: qty 3.5

## 2022-12-05 MED ORDER — HYDROMORPHONE HCL 1 MG/ML IJ SOLN: 0.2500 mg | INTRAMUSCULAR | Status: DC | PRN

## 2022-12-05 MED ORDER — CHLORHEXIDINE GLUCONATE 0.12 % MT SOLN
OROMUCOSAL | Status: AC
  Administered 2022-12-05: 15 mL via OROMUCOSAL
  Filled 2022-12-05: qty 15

## 2022-12-05 MED ORDER — PHENYLEPHRINE 80 MCG/ML (10ML) SYRINGE FOR IV PUSH (FOR BLOOD PRESSURE SUPPORT)
PREFILLED_SYRINGE | INTRAVENOUS | Status: DC | PRN
  Administered 2022-12-05: 160 ug via INTRAVENOUS

## 2022-12-05 MED ORDER — SODIUM ZIRCONIUM CYCLOSILICATE 10 G PO PACK
10.0000 g | PACK | Freq: Once | ORAL | Status: DC
  Filled 2022-12-05: qty 1

## 2022-12-05 MED ORDER — LIDOCAINE-EPINEPHRINE 1 %-1:100000 IJ SOLN
INTRAMUSCULAR | Status: DC | PRN
  Administered 2022-12-05: 5 mL via INTRADERMAL

## 2022-12-05 MED ORDER — THROMBIN 5000 UNITS EX SOLR
CUTANEOUS | Status: AC
  Filled 2022-12-05: qty 10000

## 2022-12-05 MED ORDER — LORAZEPAM 2 MG/ML IJ SOLN
1.0000 mg | Freq: Once | INTRAMUSCULAR | Status: DC
  Filled 2022-12-05: qty 1

## 2022-12-05 MED ORDER — ONDANSETRON HCL 4 MG/2ML IJ SOLN
INTRAMUSCULAR | Status: AC
  Filled 2022-12-05: qty 2

## 2022-12-05 MED ORDER — FENTANYL CITRATE (PF) 250 MCG/5ML IJ SOLN
INTRAMUSCULAR | Status: AC
  Filled 2022-12-05: qty 5

## 2022-12-05 MED ORDER — FENTANYL CITRATE (PF) 250 MCG/5ML IJ SOLN
INTRAMUSCULAR | Status: DC | PRN
  Administered 2022-12-05: 100 ug via INTRAVENOUS

## 2022-12-05 MED ORDER — SODIUM CHLORIDE 0.9 % IV SOLN
8.0000 mg/kg | Freq: Every day | INTRAVENOUS | Status: DC
  Filled 2022-12-05 (×2): qty 17

## 2022-12-05 MED ORDER — 0.9 % SODIUM CHLORIDE (POUR BTL) OPTIME
TOPICAL | Status: DC | PRN
  Administered 2022-12-05: 1000 mL

## 2022-12-05 MED ORDER — PROPOFOL 10 MG/ML IV BOLUS
INTRAVENOUS | Status: DC | PRN
  Administered 2022-12-05: 150 mg via INTRAVENOUS

## 2022-12-05 MED ORDER — SODIUM CHLORIDE 0.9 % IV SOLN: INTRAVENOUS | Status: DC | PRN

## 2022-12-05 MED ORDER — MIDAZOLAM HCL 2 MG/2ML IJ SOLN: 0.5000 mg | Freq: Once | INTRAMUSCULAR | Status: DC | PRN

## 2022-12-05 MED ORDER — LORAZEPAM 2 MG/ML IJ SOLN: 1.0000 mg | INTRAMUSCULAR | Status: DC

## 2022-12-05 MED ORDER — LORAZEPAM 2 MG/ML IJ SOLN: 1.0000 mg | Freq: Once | INTRAMUSCULAR | Status: DC

## 2022-12-05 MED ORDER — LORAZEPAM 2 MG/ML IJ SOLN
1.0000 mg | Freq: Once | INTRAMUSCULAR | Status: AC | PRN
  Administered 2022-12-05: 1 mg via INTRAVENOUS

## 2022-12-05 MED ORDER — CHLORHEXIDINE GLUCONATE 0.12 % MT SOLN: 15.0000 mL | Freq: Once | OROMUCOSAL | Status: AC

## 2022-12-05 MED ORDER — BUPIVACAINE HCL (PF) 0.5 % IJ SOLN
INTRAMUSCULAR | Status: DC | PRN
  Administered 2022-12-05: 5 mL

## 2022-12-05 SURGICAL SUPPLY — 56 items
ADH SKN CLS APL DERMABOND .7 (GAUZE/BANDAGES/DRESSINGS) ×1
ADH SKN CLS LQ APL DERMABOND (GAUZE/BANDAGES/DRESSINGS) ×1
APL SKNCLS STERI-STRIP NONHPOA (GAUZE/BANDAGES/DRESSINGS)
BAG COUNTER SPONGE SURGICOUNT (BAG) ×2 IMPLANT
BAG SPNG CNTER NS LX DISP (BAG) ×2
BAND INSRT 18 STRL LF DISP RB (MISCELLANEOUS) ×2
BAND RUBBER #18 3X1/16 STRL (MISCELLANEOUS) ×4 IMPLANT
BENZOIN TINCTURE PRP APPL 2/3 (GAUZE/BANDAGES/DRESSINGS) IMPLANT
BLADE SURG 11 STRL SS (BLADE) ×2 IMPLANT
BUR MATCHSTICK NEURO 3.0 LAGG (BURR) ×2 IMPLANT
BUR PRECISION FLUTE 5.0 (BURR) IMPLANT
CANISTER SUCT 3000ML PPV (MISCELLANEOUS) ×2 IMPLANT
DERMABOND ADVANCED .7 DNX12 (GAUZE/BANDAGES/DRESSINGS) ×2 IMPLANT
DERMABOND ADVANCED .7 DNX6 (GAUZE/BANDAGES/DRESSINGS) IMPLANT
DRAPE LAPAROTOMY 100X72X124 (DRAPES) ×2 IMPLANT
DRAPE MICROSCOPE SLANT 54X150 (MISCELLANEOUS) ×2 IMPLANT
DRAPE SURG 17X23 STRL (DRAPES) ×2 IMPLANT
DRSG OPSITE POSTOP 3X4 (GAUZE/BANDAGES/DRESSINGS) ×2 IMPLANT
DRSG OPSITE POSTOP 4X6 (GAUZE/BANDAGES/DRESSINGS) IMPLANT
DURAPREP 26ML APPLICATOR (WOUND CARE) ×2 IMPLANT
ELECT REM PT RETURN 9FT ADLT (ELECTROSURGICAL) ×1
ELECTRODE REM PT RTRN 9FT ADLT (ELECTROSURGICAL) ×2 IMPLANT
GAUZE 4X4 16PLY ~~LOC~~+RFID DBL (SPONGE) IMPLANT
GAUZE SPONGE 4X4 12PLY STRL (GAUZE/BANDAGES/DRESSINGS) IMPLANT
GLOVE BIO SURGEON STRL SZ7 (GLOVE) IMPLANT
GLOVE BIO SURGEON STRL SZ7.5 (GLOVE) IMPLANT
GLOVE BIOGEL PI IND STRL 7.5 (GLOVE) ×4 IMPLANT
GLOVE ECLIPSE 6.5 STRL STRAW (GLOVE) IMPLANT
GLOVE ECLIPSE 7.0 STRL STRAW (GLOVE) ×2 IMPLANT
GOWN STRL REUS W/ TWL LRG LVL3 (GOWN DISPOSABLE) ×4 IMPLANT
GOWN STRL REUS W/ TWL XL LVL3 (GOWN DISPOSABLE) IMPLANT
GOWN STRL REUS W/TWL 2XL LVL3 (GOWN DISPOSABLE) IMPLANT
GOWN STRL REUS W/TWL LRG LVL3 (GOWN DISPOSABLE) ×4
GOWN STRL REUS W/TWL XL LVL3 (GOWN DISPOSABLE)
HEMOSTAT POWDER KIT SURGIFOAM (HEMOSTASIS) ×2 IMPLANT
KIT BASIN OR (CUSTOM PROCEDURE TRAY) ×2 IMPLANT
KIT TURNOVER KIT B (KITS) ×2 IMPLANT
NDL HYPO 18GX1.5 BLUNT FILL (NEEDLE) IMPLANT
NDL SPNL 18GX3.5 QUINCKE PK (NEEDLE) IMPLANT
NEEDLE HYPO 18GX1.5 BLUNT FILL (NEEDLE) IMPLANT
NEEDLE HYPO 22GX1.5 SAFETY (NEEDLE) ×2 IMPLANT
NEEDLE SPNL 18GX3.5 QUINCKE PK (NEEDLE) ×1 IMPLANT
NS IRRIG 1000ML POUR BTL (IV SOLUTION) ×2 IMPLANT
PACK LAMINECTOMY NEURO (CUSTOM PROCEDURE TRAY) ×2 IMPLANT
PAD ARMBOARD 7.5X6 YLW CONV (MISCELLANEOUS) ×6 IMPLANT
SPIKE FLUID TRANSFER (MISCELLANEOUS) ×2 IMPLANT
SPONGE T-LAP 4X18 ~~LOC~~+RFID (SPONGE) IMPLANT
STRIP CLOSURE SKIN 1/2X4 (GAUZE/BANDAGES/DRESSINGS) IMPLANT
SUT VIC AB 0 CT1 18XCR BRD8 (SUTURE) ×2 IMPLANT
SUT VIC AB 0 CT1 8-18 (SUTURE) ×2
SUT VIC AB 2-0 CT1 18 (SUTURE) IMPLANT
SUT VICRYL 3-0 RB1 18 ABS (SUTURE) ×4 IMPLANT
SYR 3ML LL SCALE MARK (SYRINGE) IMPLANT
TOWEL GREEN STERILE (TOWEL DISPOSABLE) ×2 IMPLANT
TOWEL GREEN STERILE FF (TOWEL DISPOSABLE) ×2 IMPLANT
WATER STERILE IRR 1000ML POUR (IV SOLUTION) ×2 IMPLANT

## 2022-12-05 NOTE — Transfer of Care (Signed)
Immediate Anesthesia Transfer of Care Note  Patient: Hunter Reyes  Procedure(s) Performed: DECOMPRESSIVE LUMBAR LAMINECTOMY LUMBAR FOUR (Back)  Patient Location: PACU  Anesthesia Type:General  Level of Consciousness: drowsy  Airway & Oxygen Therapy: Patient Spontanous Breathing and Patient connected to face mask oxygen  Post-op Assessment: Report given to RN and Post -op Vital signs reviewed and stable  Post vital signs: Reviewed and stable  Last Vitals:  Vitals Value Taken Time  BP 133/73 12/05/22 2038  Temp    Pulse 102 12/05/22 2041  Resp 16 12/05/22 2041  SpO2 95 % 12/05/22 2041  Vitals shown include unvalidated device data.  Last Pain:  Vitals:   12/05/22 1805  TempSrc: Axillary  PainSc:          Complications: No notable events documented.

## 2022-12-05 NOTE — Anesthesia Procedure Notes (Signed)
Procedure Name: Intubation Date/Time: 12/05/2022 6:39 PM  Performed by: Reece Agar, CRNAPre-anesthesia Checklist: Patient identified, Emergency Drugs available, Suction available and Patient being monitored Patient Re-evaluated:Patient Re-evaluated prior to induction Oxygen Delivery Method: Circle System Utilized Preoxygenation: Pre-oxygenation with 100% oxygen Induction Type: IV induction Ventilation: Mask ventilation without difficulty Laryngoscope Size: Mac and 4 Grade View: Grade I Tube type: Oral Number of attempts: 1 Airway Equipment and Method: Stylet Placement Confirmation: ETT inserted through vocal cords under direct vision, positive ETCO2 and breath sounds checked- equal and bilateral Secured at: 23 cm Tube secured with: Tape Dental Injury: Teeth and Oropharynx as per pre-operative assessment

## 2022-12-05 NOTE — Progress Notes (Signed)
Pharmacy Antibiotic Note  Hunter Reyes is a 46 y.o. male admitted on 12/02/2022 with sepsis and severe AKI, now with MRSA bacteremia.  Pharmacy has been consulted for vancomycin dosing.    Vancomycin level obtained this morning, and resulted at 7 mcg/mL.  SCr 1.88 this morning (AM labs not crossing into EPIC today).     Plan: Vancomycin 2000 mg IV q24h F/U am labs tomorrow and adjust dosing if SCr continues to improve  Height: 6\' 2"  (188 cm) Weight: (!) 149 kg (328 lb 7.8 oz) IBW/kg (Calculated) : 82.2  Temp (24hrs), Avg:98.1 F (36.7 C), Min:98 F (36.7 C), Max:98.2 F (36.8 C)  Recent Labs  Lab 12/03/22 0007 12/03/22 0500 12/03/22 0553 12/04/22 1007  WBC 32.2* 33.8*  --   --   CREATININE 20.99*  --  14.78* 3.41*  LATICACIDVEN 1.2  --   --   --      Estimated Creatinine Clearance: 42.1 mL/min (A) (by C-G formula based on SCr of 3.41 mg/dL (H)).    Allergies  Allergen Reactions   Darvocet [Propoxyphene N-Acetaminophen] Hives and Itching   Tramadol Hives and Itching   Other Hives   Phillis Knack, PharmD, BCPS  12/05/2022 6:26 AM

## 2022-12-05 NOTE — Anesthesia Preprocedure Evaluation (Addendum)
Anesthesia Evaluation  Patient identified by MRN, date of birth, ID bandGeneral Assessment Comment:Pt somnolent and inconsistently responding  Reviewed: Allergy & Precautions, NPO status , Patient's Chart, lab work & pertinent test results, Unable to perform ROS - Chart review only  Airway Mallampati: II  TM Distance: >3 FB Neck ROM: Full    Dental  (+) Poor Dentition, Missing, Chipped, Dental Advisory Given   Pulmonary COPD, Current Smoker and Patient abstained from smoking.   breath sounds clear to auscultation       Cardiovascular  Rhythm:Regular Rate:Tachycardia  12/05/2022 ECHO: EF 60-65%, normal LVF, normal RVF, cannot exclude PFO, aneurysmal septum, no significant valvular abnormalities   Neuro/Psych   Anxiety Depression Bipolar Disorder   MRI L-spine personally reviewed and shows diskitis at L3-4 with a broad ventral epidural abscess/phlegmon primarily behind the L4 vertebral body with associated stenosis and compression of the cauda equina roots.    DDD    GI/Hepatic ,,,(+)     substance abuse (polysubstance abuse: suboxone?)  , Hepatitis - (chronic), B  Endo/Other    Morbid obesityBMI 42  Renal/GU Renal InsufficiencyRenal disease     Musculoskeletal   Abdominal  (+) + obese  Peds  Hematology INR 1.4   Anesthesia Other Findings   Reproductive/Obstetrics                              Anesthesia Physical Anesthesia Plan  ASA: 3 and emergent  Anesthesia Plan: General   Post-op Pain Management: Ofirmev IV (intra-op)*   Induction: Intravenous  PONV Risk Score and Plan: 1 and Ondansetron, Dexamethasone and Treatment may vary due to age or medical condition  Airway Management Planned: Oral ETT  Additional Equipment: None  Intra-op Plan:   Post-operative Plan: Possible Post-op intubation/ventilation  Informed Consent:      History available from chart only and Dental advisory  given  Plan Discussed with: CRNA and Surgeon  Anesthesia Plan Comments: (No family available, pt very somnolent, Dr. Kathyrn Sheriff verifies emergent surgery needed)        Anesthesia Quick Evaluation

## 2022-12-05 NOTE — Progress Notes (Signed)
  Echocardiogram 2D Echocardiogram has been performed.  Hunter Reyes M 12/05/2022, 10:10 AM

## 2022-12-05 NOTE — Anesthesia Postprocedure Evaluation (Signed)
Anesthesia Post Note  Patient: Hunter Reyes  Procedure(s) Performed: DECOMPRESSIVE LUMBAR LAMINECTOMY LUMBAR FOUR (Back)     Patient location during evaluation: PACU Anesthesia Type: General Level of consciousness: awake and alert and patient cooperative Pain management: pain level controlled Vital Signs Assessment: post-procedure vital signs reviewed and stable Respiratory status: spontaneous breathing, nonlabored ventilation, respiratory function stable and patient connected to nasal cannula oxygen Cardiovascular status: blood pressure returned to baseline and stable Postop Assessment: no apparent nausea or vomiting Anesthetic complications: no   No notable events documented.  Last Vitals:  Vitals:   12/05/22 2100 12/05/22 2111  BP: 123/78   Pulse: 97 97  Resp: 14 14  Temp: 36.8 C 36.8 C  SpO2: 90% 92%    Last Pain:  Vitals:   12/05/22 2045  TempSrc:   PainSc: 0-No pain                 Demorris Choyce,E. Kyran Connaughton

## 2022-12-05 NOTE — Op Note (Signed)
NEUROSURGERY OPERATIVE NOTE   PREOP DIAGNOSIS: Lumbar spinal epidural abscess, L4  POSTOP DIAGNOSIS: Same  PROCEDURE: 1. Right L4 laminotomy, sublaminar decompression with debridement of ventral epidural abscess  SURGEON: Dr. Consuella Lose, MD  ASSISTANT: Dr. Ashok Pall, MD  ANESTHESIA: General Endotracheal  EBL: 30cc  SPECIMENS: Epidural swab for aerobic/anaerobic culture  DRAINS: None  COMPLICATIONS: None immediate  CONDITION: Stable to PCAU  HISTORY: Hunter Reyes is a 46 y.o. male who initially presented to the emergency department complaining back pain and several days of urinary retention.  He was also found to have bouts of confusion.  His exam did reveal some weakness of the more distal lower extremities, right greater than left although exam is somewhat limited due to poor effort.  He underwent workup including blood cultures revealing staphylococcal bacteremia.  MRI was completed because of his pain symptoms and urinary retention which did reveal a large ventral epidural abscess with compression of the cauda equina nerve roots at the level of L4.  Given his clinical and radiographic findings, he did require urgent surgical decompression.  Unfortunately, given the patient's mental status he was unable to provide informed consent for himself.  Attempts were made to contact family listed in the electronic medical record without success.  We therefore proceeded with implied emergency consent.  PROCEDURE IN DETAIL: The patient was brought to the operating room via stretcher. After induction of general anesthesia, the patient was positioned on the operative table in the prone position with all pressure points meticulously padded. The skin of the low back was then prepped and draped in the usual sterile fashion.  After timeout was conducted, the skin was infiltrated with local anesthetic.  Spinal needle was introduced and lateral intraoperative x-ray was taken to  identify the surface projection of the L4 vertebral body.  Skin incision was then made sharply and Bovie electrocautery was used to dissect the subcutaneous tissue until the lumbodorsal fascia was identified. The fascia was then incised using Bovie electrocautery and the lamina at L4 on the right was identified and dissection was carried out in the subperiosteal plane. Self-retaining retractor was then placed, and intraoperative x-ray was taken to confirm we were at the correct level.  Using a high-speed drill, right-sided L4 laminotomy was completed to identify the ligamentum flavum inferiorly, and some epidural fat under the more superior aspect of L4 lamina.  Ball-tipped dissector was then used to elevate the ligamentum flavum which was removed with Kerrison punches.  The bed was then angled and high-speed drill was used to remove the undersurface of the left side of the L4 lamina.  The thecal sac was then gently retracted away and Kerrison punches were used to complete the sublaminar decompression on the contralateral side in order to fully decompress centrally.  At this point, the high-speed drill was used to widen the laminotomy laterally on the right side.  Kerrison punches were then used to complete the more lateral laminotomy.  I was then able to identify the lateral edge of the thecal sac.  A ball-tipped dissector was used to dissect in the ventral epidural space.  There did appear to be a significant amount of phlegmon, although no frank pus was really identified.  Culture swabs were taken of the ventral epidural space and sent for aerobic and anaerobic culture.  Multiple ball-tipped dissectors were then used to dissect out the ventral epidural space and break up the epidural phlegmon.  Once this was done, the wound was irrigated with normal  saline.  Hemostasis was secured using morcellized Gelfoam with thrombin.  Wound was again irrigated and no active bleeding was identified. Self-retaining  retractor was then removed, and the wound is closed in layers using a combination of interrupted 0 Vicryl and 3-0 Vicryl stitches. The skin was closed using standard skin glue.  At the end of the case all sponge, needle, and instrument counts were correct. The patient was then transferred to the stretcher and taken to the postanesthesia care unit in stable hemodynamic condition.   Consuella Lose, MD [facility]

## 2022-12-05 NOTE — Progress Notes (Addendum)
PROGRESS NOTE    Hunter Reyes  KDT:[id number] DOB: 07/27/1976 DOA: 12/02/2022 PCP: Celene Squibb, MD     Chief Complaint  Patient presents with   Urinary Retention   Back Pain    Brief Narrative:  Hunter Reyes is a 46 y.o. Caucasian male with medical history significant for bipolar 1 disorder, borderline personality disorder, COPD, crack cocaine abuse, depression, peripheral neuropathy, history of right foot osteomyelitis, panic attack and history of seizures, who presented to the emergency room with acute onset of altered mental status with associated urinary retention and low back pain.  The patient has not urinated for 3 days.  He admits to dysuria and was initially having urinary frequency and urgency.  He was found by EMS covered with an 6 and feces.  He stated that he has been using Motrin for back pain on a regular basis over the last 3 months.  He has been taking up to 600 mg possibly more than once a day and 2-3 times a week.  He denies any nausea or vomiting or diarrhea.  No fever or chills.  No chest pain or palpitations.   ED Course: When he came to the ER, vital signs were normal.  Labs revealed hyponatremia and chloremia, hyperkalemia 5.8, BUN more than 300 and creatinine 20.99 with calcium of 8.8 anion gap of 26, albumin of 2.7 and total protein of 8.9.  Serum lipase was 222.  Lactic acid was 1.2 and CK 40.  CBC showed leukocytosis of 32.2 with neutrophilia and thrombocytosis of 411 with mild anemia with hemoglobin of 12.8 hematocrit 27.1.  Previous BUN and creatinine were 8 and 0.95 however on 09/28/2017.  Assessment & Plan:   Principal Problem:   MRSA bacteremia Active Problems:   AKI (acute kidney injury) ([place])   Diskitis   Bipolar 1 disorder ([place])   Peripheral neuropathy   Polysubstance abuse (HCC)   Metabolic acidosis   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 (HCC)    AKI (acute kidney injury) ([place]) Urinary retention - his is likely prerenal due  to severe dehydration as well as obstructive uropathy and the use of nephrotoxic agents (NSAIDs). -At time of admission BUN about 300 and creatinine more than 20. -After Foley catheter placed and fluid resuscitation provided  -Function continues to improve, creatinine is 1.88 today, -Continue to monitor electrolyte closely for postobstructive diuresis  Hyperkalemia  - Due to AKI, improved  Diskitis/MRSA bacteremia -Discitis as evident on imaging -Patient appears with lower extremity weakness, urinary retention, possible incontinence, unclear acuity, as report last time he was able to stand up was a month ago, stat MRI is pending. -Management per ID, vancomycin has been switched to daptomycin   Peripheral neuropathy -Continue Neurontin and adjusted dose   Bipolar 1 disorder ([place]) -No suicidal ideation or hallucination -Stable mood currently -Continue Abilify-antidepressant therapy.   Polysubstance abuse ([place]) -This includes cocaine and likely opiates. -Continue the use of Suboxone -Will be judicious with additional analgesics medications provided -Cessation counseling provided -Outpatient follow-up with pain clinic encouraged.   Metabolic acidosis -In the setting of acute renal failure  Bilateral heel ulcers POA  - cont with wound care, MRI of both feet tomorrow.    Hypomagnesemia Hypophosphatemia -Repleted   Hypernatremia  - Will start on D5W  Hepatitis B infection -ID is following    DVT prophylaxis: Lovenox Code Status: Full Family Communication: None at bedside, unable to reach brother by phone Disposition:   Status is: Inpatient  Consultants:  ID  Subjective:  Patient remains with Foley catheter, had 1 episode of diarrhea today. Objective: Vitals:   12/04/22 2141 12/04/22 2200 12/04/22 2305 12/05/22 0302  BP: 135/68  130/88 119/76  Pulse: (!) 106 (!) 101 (!) 104 (!) 109  Resp: 15 13 15 14   Temp: 98.1 F (36.7 C)  98 F (36.7 C) 98.1 F (36.7  C)  TempSrc: Oral  Oral Oral  SpO2: 94% 92% 90% 92%  Weight:      Height:        Intake/Output Summary (Last 24 hours) at 12/05/2022 1440 Last data filed at 12/05/2022 0958 Gross per 24 hour  Intake 1727.52 ml  Output 5050 ml  Net -3322.48 ml   Filed Weights   12/02/22 2230  Weight: (!) 149 kg    Examination:  Awake Alert, he is confused, but able to answer most questions appropriately Symmetrical Chest wall movement, Good air movement bilaterally, CTAB RRR,No Gallops,Rubs or new Murmurs, No Parasternal Heave +ve B.Sounds, Abd Soft, No tenderness, No rebound - guarding or rigidity. No Cyanosis, Clubbing or edema, bilateral lower extremity weakness R>L     Data Reviewed: I have personally reviewed following labs and imaging studies  CBC: Recent Labs  Lab 12/03/22 0007 12/03/22 0500  WBC 32.2* 33.8*  NEUTROABS 27.0*  --   HGB 12.8* 13.1  HCT 37.1* 38.4*  MCV 82.8 84.2  PLT 411* 446*    Basic Metabolic Panel: Recent Labs  Lab 12/03/22 0007 12/03/22 0553 12/04/22 1007 12/05/22 0524  NA 129* 133* 148* 149*  K 5.8* 5.0 5.1 5.3*  CL 92* 104 122* 125*  CO2 11* 11* 15* 15*  GLUCOSE 95 105* 110* 110*  BUN >300* 184* 118* 89*  CREATININE 20.99* 14.78* 3.41* 1.88*  CALCIUM 8.8* 8.7* 8.9 8.8*  PHOS  --   --  5.7* 4.7*    GFR: Estimated Creatinine Clearance: 76.4 mL/min (A) (by C-G formula based on SCr of 1.88 mg/dL (H)).  Liver Function Tests: Recent Labs  Lab 12/03/22 0007 12/04/22 1007 12/05/22 0524  AST 17  --   --   ALT 15  --   --   ALKPHOS 99  --   --   BILITOT 1.0  --   --   PROT 8.9*  --   --   ALBUMIN 2.7* 2.4* 2.3*    CBG: No results for input(s): "GLUCAP" in the last 168 hours.   Recent Results (from the past 240 hour(s))  Resp Panel by RT-PCR (Flu A&B, Covid) Anterior Nasal Swab     Status: None   Collection Time: 12/02/22 11:30 PM   Specimen: Anterior Nasal Swab  Result Value Ref Range Status   SARS Coronavirus 2 by RT PCR  NEGATIVE NEGATIVE Final    Comment: (NOTE) SARS-CoV-2 target nucleic acids are NOT DETECTED.  The SARS-CoV-2 RNA is generally detectable in upper respiratory specimens during the acute phase of infection. The lowest concentration of SARS-CoV-2 viral copies this assay can detect is 138 copies/mL. A negative result does not preclude SARS-Cov-2 infection and should not be used as the sole basis for treatment or other patient management decisions. A negative result may occur with  improper specimen collection/handling, submission of specimen other than nasopharyngeal swab, presence of viral mutation(s) within the areas targeted by this assay, and inadequate number of viral copies(<138 copies/mL). A negative result must be combined with clinical observations, patient history, and epidemiological information. The expected result is Negative.  Fact Sheet for  Patients:  EntrepreneurPulse.com.au  Fact Sheet for Healthcare Providers:  IncredibleEmployment.be  This test is no t yet approved or cleared by the [place] FDA and  has been authorized for detection and/or diagnosis of SARS-CoV-2 by FDA under an Emergency Use Authorization (EUA). This EUA will remain  in effect (meaning this test can be used) for the duration of the COVID-19 declaration under Section 564(b)(1) of the Act, 21 U.S.C.section 360bbb-3(b)(1), unless the authorization is terminated  or revoked sooner.       Influenza A by PCR NEGATIVE NEGATIVE Final   Influenza B by PCR NEGATIVE NEGATIVE Final    Comment: (NOTE) The Xpert Xpress SARS-CoV-2/FLU/RSV plus assay is intended as an aid in the diagnosis of influenza from Nasopharyngeal swab specimens and should not be used as a sole basis for treatment. Nasal washings and aspirates are unacceptable for Xpert Xpress SARS-CoV-2/FLU/RSV testing.  Fact Sheet for Patients: EntrepreneurPulse.com.au  Fact Sheet for  Healthcare Providers: IncredibleEmployment.be  This test is not yet approved or cleared by the [place] FDA and has been authorized for detection and/or diagnosis of SARS-CoV-2 by FDA under an Emergency Use Authorization (EUA). This EUA will remain in effect (meaning this test can be used) for the duration of the COVID-19 declaration under Section 564(b)(1) of the Act, 21 U.S.C. section 360bbb-3(b)(1), unless the authorization is terminated or revoked.  Performed at [facility], [address] [place], [place] [address]   Blood culture (routine x 2)     Status: Abnormal (Preliminary result)   Collection Time: 12/03/22  4:00 AM   Specimen: Site Not Specified; Blood  Result Value Ref Range Status   Specimen Description   Final    SITE NOT SPECIFIED BOTTLES DRAWN AEROBIC AND ANAEROBIC Performed at [facility], [address] [place], [place] [address]    Special Requests   Final    Blood Culture adequate volume Performed at [place] [place], [place] [address]    Culture  Setup Time   Final    GRAM POSITIVE COCCI AEB BOTTLES DRAWN AEROBIC ONLY Gram Stain Report Called to,Read Back By and Verified With: REECE THOMPSON,RN @2143  ON 12/04/22 BY SSLANE CRITICAL RESULT CALLED TO, READ BACK BY AND VERIFIED WITH: G ABBOTT,PHARMD@0200  12/05/22 [place]    Culture (A)  Final    STAPHYLOCOCCUS AUREUS CULTURE REINCUBATED FOR BETTER GROWTH Performed at [facility] [address] [place], [place] [address]    Report Status PENDING  Incomplete  Blood Culture ID Panel (Reflexed)     Status: Abnormal   Collection Time: 12/03/22  4:00 AM  Result Value Ref Range Status   Enterococcus faecalis NOT DETECTED NOT DETECTED Final   Enterococcus Faecium NOT DETECTED NOT DETECTED Final   Listeria monocytogenes NOT DETECTED NOT DETECTED Final   Staphylococcus species DETECTED (A) NOT DETECTED Final    Comment: CRITICAL RESULT CALLED TO, READ BACK BY AND  VERIFIED WITH: G ABBOTT,PHARMD@0200  12/05/22 [place]    Staphylococcus aureus (BCID) DETECTED (A) NOT DETECTED Final    Comment: Methicillin (oxacillin)-resistant Staphylococcus aureus (MRSA). MRSA is predictably resistant to beta-lactam antibiotics (except ceftaroline). Preferred therapy is vancomycin unless clinically contraindicated. Patient requires contact precautions if  hospitalized. CRITICAL RESULT CALLED TO, READ BACK BY AND VERIFIED WITH: G ABBOTT,PHARMD@0200  12/05/22 [place]    Staphylococcus epidermidis NOT DETECTED NOT DETECTED Final   Staphylococcus lugdunensis NOT DETECTED NOT DETECTED Final   Streptococcus species NOT DETECTED NOT DETECTED Final   Streptococcus agalactiae NOT DETECTED NOT  DETECTED Final   Streptococcus pneumoniae NOT DETECTED NOT DETECTED Final   Streptococcus pyogenes NOT DETECTED NOT DETECTED Final   A.calcoaceticus-baumannii NOT DETECTED NOT DETECTED Final   Bacteroides fragilis NOT DETECTED NOT DETECTED Final   Enterobacterales NOT DETECTED NOT DETECTED Final   Enterobacter cloacae complex NOT DETECTED NOT DETECTED Final   Escherichia coli NOT DETECTED NOT DETECTED Final   Klebsiella aerogenes NOT DETECTED NOT DETECTED Final   Klebsiella oxytoca NOT DETECTED NOT DETECTED Final   Klebsiella pneumoniae NOT DETECTED NOT DETECTED Final   Proteus species NOT DETECTED NOT DETECTED Final   Salmonella species NOT DETECTED NOT DETECTED Final   Serratia marcescens NOT DETECTED NOT DETECTED Final   Haemophilus influenzae NOT DETECTED NOT DETECTED Final   Neisseria meningitidis NOT DETECTED NOT DETECTED Final   Pseudomonas aeruginosa NOT DETECTED NOT DETECTED Final   Stenotrophomonas maltophilia NOT DETECTED NOT DETECTED Final   Candida albicans NOT DETECTED NOT DETECTED Final   Candida auris NOT DETECTED NOT DETECTED Final   Candida glabrata NOT DETECTED NOT DETECTED Final   Candida krusei NOT DETECTED NOT DETECTED Final   Candida parapsilosis NOT DETECTED NOT  DETECTED Final   Candida tropicalis NOT DETECTED NOT DETECTED Final   Cryptococcus neoformans/gattii NOT DETECTED NOT DETECTED Final   Meth resistant mecA/C and MREJ DETECTED (A) NOT DETECTED Final    Comment: CRITICAL RESULT CALLED TO, READ BACK BY AND VERIFIED WITH: G ABBOTT,PHARMD@0200  12/05/22 [place] Performed at [facility], [address] [place], [place] [address]   Urine Culture     Status: Abnormal   Collection Time: 12/03/22  4:01 AM   Specimen: Urine, Catheterized  Result Value Ref Range Status   Specimen Description   Final    URINE, CATHETERIZED Performed at [facility], [address] [place], [place] [address]    Special Requests   Final    NONE Performed at [facility], [address], [place],  [address]    Culture (A)  Final    20,000 COLONIES/mL METHICILLIN RESISTANT STAPHYLOCOCCUS AUREUS   Report Status 12/05/2022 FINAL  Final   Organism ID, Bacteria METHICILLIN RESISTANT STAPHYLOCOCCUS AUREUS (A)  Final      Susceptibility   Methicillin resistant staphylococcus aureus - MIC*    CIPROFLOXACIN >=8 RESISTANT Resistant     GENTAMICIN <=0.5 SENSITIVE Sensitive     NITROFURANTOIN 32 SENSITIVE Sensitive     OXACILLIN >=4 RESISTANT Resistant     TETRACYCLINE <=1 SENSITIVE Sensitive     VANCOMYCIN <=0.5 SENSITIVE Sensitive     TRIMETH/SULFA <=10 SENSITIVE Sensitive     CLINDAMYCIN <=0.25 SENSITIVE Sensitive     RIFAMPIN <=0.5 SENSITIVE Sensitive     Inducible Clindamycin NEGATIVE Sensitive     * 20,000 COLONIES/mL METHICILLIN RESISTANT STAPHYLOCOCCUS AUREUS  Blood culture (routine x 2)     Status: None (Preliminary result)   Collection Time: 12/03/22  4:10 AM   Specimen: Site Not Specified; Blood  Result Value Ref Range Status   Specimen Description   Final    SITE NOT SPECIFIED BOTTLES DRAWN AEROBIC AND ANAEROBIC   Special Requests Blood Culture adequate volume  Final   Culture  Setup Time   Final    BOTTLES DRAWN AEROBIC ONLY GRAM POSITIVE  COCCI Gram Stain Report Called to,Read Back By and Verified With: NAKIA MCGOWAN @ 1324 ON 12/05/22 C [place]    Culture   Final    NO GROWTH 2 DAYS Performed at [facility], [address] [place],  [place] [address]    Report Status PENDING  Incomplete  Culture, blood (Routine X 2) w Reflex to ID Panel     Status: None (Preliminary result)   Collection Time: 12/05/22  9:12 AM   Specimen: BLOOD  Result Value Ref Range Status   Specimen Description BLOOD LEFT ANTECUBITAL  Final   Special Requests   Final    BOTTLES DRAWN AEROBIC AND ANAEROBIC Blood Culture adequate volume   Culture   Final    NO GROWTH < 12 HOURS Performed at [facility], [place] [address], [place], [place] [address]    Report Status PENDING  Incomplete  Culture, blood (Routine X 2) w Reflex to ID Panel     Status: None (Preliminary result)   Collection Time: 12/05/22  9:15 AM   Specimen: BLOOD LEFT WRIST  Result Value Ref Range Status   Specimen Description BLOOD LEFT WRIST  Final   Special Requests   Final    BOTTLES DRAWN AEROBIC AND ANAEROBIC Blood Culture adequate volume   Culture   Final    NO GROWTH < 12 HOURS Performed at [facility], [place] [address], [place],  [address]    Report Status PENDING  Incomplete         Radiology Studies: ECHOCARDIOGRAM COMPLETE  Result Date: 12/05/2022    ECHOCARDIOGRAM REPORT   Patient Name:   Hunter Reyes Date of Exam: 12/05/2022 Medical Rec #:  [id number]         Height:       74.0 in Accession #:    [id number]        Weight:       328.5 lb Date of Birth:  1976-10-13         BSA:          2.684 m Patient Age:    79 years          BP:           118/76 mmHg Patient Gender: M                 HR:           88 bpm. Exam Location:  Inpatient Procedure: 2D Echo, Cardiac Doppler and Color Doppler Indications:    Bacteremia R78.81  History:        Patient has no prior history of Echocardiogram examinations.                 COPD.  Sonographer:    Darlina Sicilian RDCS Referring Phys: [place]  1. Left ventricular ejection fraction, by estimation, is 60 to 65%. The left ventricle has normal function. The left ventricle has no regional wall motion abnormalities. Left ventricular diastolic parameters were normal.  2. Right ventricular systolic function is normal. The right ventricular size is normal. Tricuspid regurgitation signal is inadequate for assessing PA pressure.  3. The interatrial septum appears aneurysmal. Cannot exclude a possible PFO by color doppler.  4. The mitral valve is normal in structure. No evidence of mitral valve regurgitation. No evidence of mitral stenosis.  5. The aortic valve is tricuspid. Aortic valve regurgitation is not visualized. No aortic stenosis is present.  6. Aortic dilatation noted. There is borderline dilatation of the aortic root, measuring 36 mm.  7. The inferior vena cava is normal in size with greater than 50% respiratory variability, suggesting right atrial pressure of 3 mmHg. Conclusion(s)/Recommendation(s): No evidence of valvular vegetations on  this transthoracic echocardiogram. Consider a transesophageal echocardiogram to exclude infective endocarditis if clinically indicated. FINDINGS  Left Ventricle: Left ventricular ejection fraction, by estimation, is 60 to 65%. The left ventricle has normal function. The left ventricle has no regional wall motion abnormalities. The left ventricular internal cavity size was normal in size. There is  no left ventricular hypertrophy. Left ventricular diastolic parameters were normal. Right Ventricle: The right ventricular size is normal. No increase in right ventricular wall thickness. Right ventricular systolic function is normal. Tricuspid regurgitation signal is inadequate for assessing PA pressure. Left Atrium: Left atrial size was normal in size. Right Atrium: Right atrial size was normal in size. Pericardium: There is no evidence of pericardial effusion.  Mitral Valve: The mitral valve is normal in structure. No evidence of mitral valve regurgitation. No evidence of mitral valve stenosis. Tricuspid Valve: The tricuspid valve is grossly normal. Tricuspid valve regurgitation is not demonstrated. No evidence of tricuspid stenosis. Aortic Valve: The aortic valve is tricuspid. Aortic valve regurgitation is not visualized. No aortic stenosis is present. Pulmonic Valve: The pulmonic valve was grossly normal. Pulmonic valve regurgitation is not visualized. No evidence of pulmonic stenosis. Aorta: Aortic dilatation noted and the aortic root is normal in size and structure. There is borderline dilatation of the aortic root, measuring 36 mm. Venous: The inferior vena cava is normal in size with greater than 50% respiratory variability, suggesting right atrial pressure of 3 mmHg. IAS/Shunts: The interatrial septum is aneurysmal. The atrial septum is grossly normal.  LEFT VENTRICLE PLAX 2D LVIDd:         5.10 cm   Diastology LVIDs:         3.30 cm   LV e' medial:    7.62 cm/s LV PW:         0.80 cm   LV E/e' medial:  7.5 LV IVS:        0.90 cm   LV e' lateral:   16.30 cm/s LVOT diam:     2.50 cm   LV E/e' lateral: 3.5 LV SV:         72 LV SV Index:   27 LVOT Area:     4.91 cm  RIGHT VENTRICLE RV S prime:     18.00 cm/s TAPSE (M-mode): 1.8 cm LEFT ATRIUM             Index        RIGHT ATRIUM           Index LA Vol (A2C):   38.8 ml 14.46 ml/m  RA Area:     14.10 cm LA Vol (A4C):   21.5 ml 8.01 ml/m   RA Volume:   30.30 ml  11.29 ml/m LA Biplane Vol: 29.0 ml 10.81 ml/m  AORTIC VALVE LVOT Vmax:   80.90 cm/s LVOT Vmean:  52.000 cm/s LVOT VTI:    0.146 m  AORTA Ao Root diam: 3.60 cm MITRAL VALVE MV Area (PHT): 3.45 cm    SHUNTS MV Decel Time: 220 msec    Systemic VTI:  0.15 m MV E velocity: 57.40 cm/s  Systemic Diam: 2.50 cm MV A velocity: 74.60 cm/s MV E/A ratio:  0.77 Eleonore Chiquito MD Electronically signed by Eleonore Chiquito MD Signature Date/Time: 12/05/2022/10:33:02 AM     Final         Scheduled Meds:  buprenorphine-naloxone  1 tablet Sublingual BID   Chlorhexidine Gluconate Cloth  6 each Topical Q0600   gabapentin  600 mg Oral BID  LORazepam  1 mg Intravenous Once   sertraline  25 mg Oral Daily   Continuous Infusions:  sodium chloride 150 mL/hr at 12/04/22 1527   DAPTOmycin (CUBICIN) 850 mg in sodium chloride 0.9 % IVPB       LOS: 2 days        Phillips Climes, MD [facility]   To contact the attending provider between 7A-7P or the covering provider during after hours 7P-7A, please log into the web site [email or web address] and access using universal [place] password for that web site. If you do not have the password, please call the hospital operator.  12/05/2022, 2:40 PM

## 2022-12-05 NOTE — Progress Notes (Signed)
Attempted to call every phone number listed in patient's demographics. None of the phone numbers are working numbers.  Unable to obtain consent for surgery.  Surgery deemed emergent by Dr. Kathyrn Sheriff

## 2022-12-05 NOTE — Consult Note (Signed)
Chief Complaint   Chief Complaint  Patient presents with   Urinary Retention   Back Pain    History of Present Illness  Hunter Reyes is a 46 y.o. male admitted to [facility] after presenting with altered mental status, back pain and urinary incontinence. Pt is somewhat obtunded and unable to provide meaningful history at this time however review of the EMR indicates the patient has a history of polysubstance use, foot osteomyelitis, as well as bipolar 1 disorder, borderline personality disorder and COPD. Upon his arrival he was found to have urinary retention and Foley catheter was placed. He was also found to have likely prerenal AKI which is improving.  Past Medical History   Past Medical History:  Diagnosis Date   Arthritis    hands   Bipolar 1 disorder ([place])    Borderline personality disorder ([employer])    COPD (chronic obstructive pulmonary disease) ([place])    denies SOB with daily activities   DDD (degenerative disc disease), lumbar    and thoracic   Deliberate self-cutting    history of   Depression    Foot ulcer, right ([place]) 11/02/2013   History of seizures    as a child - no seizures since before teenage years; states unknown cause   Neuropathic ulcer ([place])    Neuropathy    Non-restorable tooth 10/2013   multiple   Obesity    Osteomyelitis ([place]) 2014   right foot   Panic attacks    Peripheral neuropathy     Past Surgical History   Past Surgical History:  Procedure Laterality Date   AMPUTATION     left middle toe   BREAST BIOPSY Right 07/12/2017   Procedure: BREAST BIOPSY;  Surgeon: Aviva Signs, MD;  Location: [facility];  Service: General;  Laterality: Right;   INNER EAR SURGERY Left    as a child   MULTIPLE EXTRACTIONS WITH ALVEOLOPLASTY N/A 11/17/2013   Procedure: MULTIPLE EXTRACTION WITH ALVEOLOPLASTY;  Surgeon: Gae Bon, DDS;  Location: [facility];  Service: Oral Surgery;  Laterality: N/A;    Social History   Social History   Tobacco Use   Smoking status:  Every Day    Packs/day: 1.00    Years: 26.00    Total pack years: 26.00    Types: Cigarettes   Smokeless tobacco: Current    Types: Snuff  Vaping Use   Vaping Use: Never used  Substance Use Topics   Alcohol use: Yes    Comment: occasionally   Drug use: No    Types: Cocaine, Marijuana    Comment: prior use    Medications   Prior to Admission medications   Medication Sig Start Date End Date Taking? Authorizing Provider  acetaminophen (TYLENOL) 500 MG tablet Take 1,000 mg by mouth every 6 (six) hours as needed for mild pain or headache.   Yes [provider]  ibuprofen (ADVIL,MOTRIN) 600 MG tablet Take 1 tablet (600 mg total) by mouth 4 (four) times daily. Patient taking differently: Take 600 mg by mouth every 8 (eight) hours as needed for mild pain. 06/27/17  Yes Lily Kocher, PA-C  albuterol (PROVENTIL HFA;VENTOLIN HFA) 108 (90 BASE) MCG/ACT inhaler Inhale into the lungs every 6 (six) hours as needed for wheezing or shortness of breath. Patient not taking: Reported on 12/03/2022    [provider]  buprenorphine-naloxone (SUBOXONE) 2-0.5 mg SUBL SL tablet Place 1 tablet under the tongue 2 (two) times daily. Patient not taking: Reported on 12/03/2022  [provider]  gabapentin (NEURONTIN) 300 MG capsule Take 2 capsules (600 mg total) by mouth 3 (three) times daily. Patient not taking: Reported on 12/03/2022 11/12/17   Trula Slade, DPM  sertraline (ZOLOFT) 50 MG tablet Take 25 mg by mouth daily.  Patient not taking: Reported on 12/03/2022    [provider]  tiZANidine (ZANAFLEX) 4 MG tablet Take 4 mg by mouth every 6 (six) hours as needed for muscle spasms. Patient not taking: Reported on 12/03/2022    [provider]    Allergies   Allergies  Allergen Reactions   Darvocet [Propoxyphene N-Acetaminophen] Hives and Itching   Tramadol Hives and Itching   Other Hives    Review of Systems  ROS  Neurologic Exam   Somnolent but will arouse to voice Answers simple questions Good strength BUE At least antigravity hip flexors, knee extensors and at least 3/5 bilateral PF/DF (exam limited due to poor effort)  Imaging  MRI L-spine personally reviewed and shows diskitis at L3-4 with a broad ventral epidural abscess/phlegmon primarily behind the L4 vertebral body with associated stenosis and compression of the cauda equina roots.   Impression  - 46 y.o. male with hx of substance abuse and Axis 1/Axis 2 phychiatric disorder with now subacute symptoms of cauda equina syndrome. With his MRI demonstrating significant stenosis due to abscess at L4 I think surgical decompression is indicated.  Plan  - We will plan on proceeding with right L4 laminotomy, sublaminar decompression, debridement of epidural abscess.  The patient's current mental status precludes meaningful discussion regarding his diagnosis and surgical treatment. I have attempted to contact the patient's brother and sister at numbers listed in the EMR unsuccessfully. We will therefore proceed with implied emergency consent  unless his mental status improves or some family arrives.  Consuella Lose, MD [facility]

## 2022-12-05 NOTE — Progress Notes (Signed)
Attempted MRI. Patient seemed to be claustrophobic even though he stated he was not. He could not tolerate being in the MRI. I asked him if he wanted me to check to see if they could give him medication to help him relax so we could do the exam. He declined and wanted to go back to the floor. RN informed of this and patient was sent back to unit.

## 2022-12-05 NOTE — Progress Notes (Signed)
PT Cancellation Note  Patient Details Name: Hunter Reyes MRN: [id number] DOB: 04-22-1976   Cancelled Treatment:    Reason Eval/Treat Not Completed: Patient at procedure or test/unavailable  Will follow up later today as time allows;  Otherwise, will follow up for PT tomorrow;   Thank you,  Roney Marion, [place] 12/05/2022, 10:25 AM

## 2022-12-05 NOTE — Progress Notes (Signed)
PHARMACY - PHYSICIAN COMMUNICATION CRITICAL VALUE ALERT - BLOOD CULTURE IDENTIFICATION (BCID)  Hunter Reyes is an 46 y.o. male who presented to [facility] on 12/02/2022 with a chief complaint of back pain/diskitis and AKI now found to have MRSA bacteremia.  Assessment:   2/4 blood cultures from 12/11 growing MRSA  Name of physician (or Provider) Contacted: Dr. Claria Dice  Current antibiotics: Daptomycin IV  Changes to prescribed antibiotics recommended:  Patient is on recommended antibiotics - No changes needed  Results for orders placed or performed during the hospital encounter of 12/02/22  Blood Culture ID Panel (Reflexed) (Collected: 12/03/2022  4:10 AM)  Result Value Ref Range   Enterococcus faecalis NOT DETECTED NOT DETECTED   Enterococcus Faecium NOT DETECTED NOT DETECTED   Listeria monocytogenes NOT DETECTED NOT DETECTED   Staphylococcus species DETECTED (A) NOT DETECTED   Staphylococcus aureus (BCID) DETECTED (A) NOT DETECTED   Staphylococcus epidermidis NOT DETECTED NOT DETECTED   Staphylococcus lugdunensis NOT DETECTED NOT DETECTED   Streptococcus species NOT DETECTED NOT DETECTED   Streptococcus agalactiae NOT DETECTED NOT DETECTED   Streptococcus pneumoniae NOT DETECTED NOT DETECTED   Streptococcus pyogenes NOT DETECTED NOT DETECTED   A.calcoaceticus-baumannii NOT DETECTED NOT DETECTED   Bacteroides fragilis NOT DETECTED NOT DETECTED   Enterobacterales NOT DETECTED NOT DETECTED   Enterobacter cloacae complex NOT DETECTED NOT DETECTED   Escherichia coli NOT DETECTED NOT DETECTED   Klebsiella aerogenes NOT DETECTED NOT DETECTED   Klebsiella oxytoca NOT DETECTED NOT DETECTED   Klebsiella pneumoniae NOT DETECTED NOT DETECTED   Proteus species NOT DETECTED NOT DETECTED   Salmonella species NOT DETECTED NOT DETECTED   Serratia marcescens NOT DETECTED NOT DETECTED   Haemophilus influenzae NOT DETECTED NOT DETECTED   Neisseria meningitidis NOT DETECTED NOT DETECTED    Pseudomonas aeruginosa NOT DETECTED NOT DETECTED   Stenotrophomonas maltophilia NOT DETECTED NOT DETECTED   Candida albicans NOT DETECTED NOT DETECTED   Candida auris NOT DETECTED NOT DETECTED   Candida glabrata NOT DETECTED NOT DETECTED   Candida krusei NOT DETECTED NOT DETECTED   Candida parapsilosis NOT DETECTED NOT DETECTED   Candida tropicalis NOT DETECTED NOT DETECTED   Cryptococcus neoformans/gattii NOT DETECTED NOT DETECTED   Meth resistant mecA/C and MREJ DETECTED (A) NOT DETECTED   Thank you for allowing pharmacy to be a part of this patient's care.  Ardyth Harps, PharmD Clinical Pharmacist

## 2022-12-05 NOTE — Progress Notes (Signed)
Pharmacy Antibiotic Note  Hunter Reyes is a 46 y.o. male admitted on 12/02/2022 with MRSA bacteremia. Pharmacy has been consulted for Daptomycin dosing.  AKI on admit now resolving with SCr down to 1.88, estimated CrCl>30 ml/min.   Plan: - Start Daptomycin 850 mg (8 mg/kg AdjBW) every 24 hours - CK check weekly on Thursdays - Will continue to follow renal function, culture results, LOT, and antibiotic de-escalation plans   Height: 6\' 2"  (188 cm) Weight: (!) 149 kg (328 lb 7.8 oz) IBW/kg (Calculated) : 82.2  Temp (24hrs), Avg:98.1 F (36.7 C), Min:98 F (36.7 C), Max:98.2 F (36.8 C)  Recent Labs  Lab 12/03/22 0007 12/03/22 0500 12/03/22 0553 12/04/22 1007 12/05/22 0524  WBC 32.2* 33.8*  --   --   --   CREATININE 20.99*  --  14.78* 3.41* 1.88*  LATICACIDVEN 1.2  --   --   --   --     Estimated Creatinine Clearance: 76.4 mL/min (A) (by C-G formula based on SCr of 1.88 mg/dL (H)).    Allergies  Allergen Reactions   Darvocet [Propoxyphene N-Acetaminophen] Hives and Itching   Tramadol Hives and Itching   Other Hives    Antimicrobials this admission: Vancomycin 2g 12/11 x 1 dose Cefepime 12/11 >> 12/12 Flagyl 12/11 >> 12/12  Dose adjustments this admission: VR 7 mcg/ml on 12/13 AM  Microbiology results: 12/11 BCx 1/4 MRSA 12/11 UCx >> 20k MRSA 12/12 repeat BCx >>  Thank you for allowing pharmacy to be a part of this patient's care.  Alycia Rossetti, PharmD, BCPS Infectious Diseases Clinical Pharmacist 12/05/2022 11:24 AM   **Pharmacist phone directory can now be found on [email or web address] ([email or web address]).  Listed under [place].

## 2022-12-05 NOTE — Consult Note (Addendum)
Date of Admission:  12/02/2022          Reason for Consult: MRSA bacteremia, diskitis    Referring Provider: CHAMP,  Auto-consult and Phillips Climes, MD   Assessment:  MRSA bacteremia with likely  Vertebral osteomyelitis, diskitis, rule out epidural abscess, cord compression given Urinary retention Fecal incontinence LE weakness Also with hx of osteomyelitis third distal phalanx on left toe context of bilateral ulcers in the feet status post treatment with Bactrim in 2017 Polysubstance abuse including crack cocaine and marijuana  Chronic hepatitis B without hepatic coma   Plan:  Switch to daptomycin Agree with MRI Lumbar spine asap TTE Repeat blood cultures  MRI both feet tomorrow Check hepatitis A, C as well as hepatitis B labs ESR, CRP  Principal Problem:   MRSA bacteremia Active Problems:   AKI (acute kidney injury) ([place])   Diskitis   Bipolar 1 disorder (HCC)   Peripheral neuropathy   Polysubstance abuse (HCC)   Metabolic acidosis   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 (HCC)   Scheduled Meds:  buprenorphine-naloxone  1 tablet Sublingual BID   Chlorhexidine Gluconate Cloth  6 each Topical Q0600   gabapentin  600 mg Oral BID   sertraline  25 mg Oral Daily   Continuous Infusions:  sodium chloride 150 mL/hr at 12/04/22 1527   DAPTOmycin (CUBICIN) 850 mg in sodium chloride 0.9 % IVPB     PRN Meds:.acetaminophen **OR** acetaminophen, albuterol, ondansetron **OR** ondansetron (ZOFRAN) IV, tiZANidine, traZODone  HPI: Hunter Reyes is a 46 y.o. male with history of bipolar disorder borderline personality disorder COPD crack cocaine abuse peripheral neuropathy prior ulcers on his feet with osteomyelitis having been sent on MRI in 2017, who had apparently not been able to walk for a month and was being cared for by male friend whom he lives.  He was brought to the ER due to onset of encephalopathy and inability to urinate for 3 days and as  well as fecal incontinence.  The ER he was hypotensive and tachycardic.  His labs were significant for acute renal failure with a creatinine of 20.99, hyperkalemia leukocytosis.  Blood cultures were taken during admission.  T abdomen/pelvis showed distended bladder with mild to moderate bilateral hydroureteronephrosis all the way to the bladder with no stones and enlarged prostate with dystrophic calcifications external inguinal lymph nodes, hepatomegaly with degenerative changes seen in the lumbar spine with spinal canal and foraminal stenosis.  He was given fluids Foley catheter was placed and been followed closely by nephrology.  His obstructive uropathy if he is resolving and creatinine is nearly normalized.  Main complaints are his back pain and his weakness  His HIV fourth-generation assay was negative but his hepatitis B surface antigen was positive when checked by nephrology.  He is unaware of his diagnosis of hepatitis B.  I spent 84 minutes with the patient including than 50% of the time in face to face counseling of the patient is MRSA bacteremia with concern for discitis osteomyelitis epidural abscess, potentially with source being his primary osteomyelitis in his foot personally reviewing the abdomen pelvis CT renal plain films of the chest along with review of medical records in preparation for the visit and during the visit and in coordination of his care.    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 No pertinent family history. Allergies  Allergen Reactions   Darvocet [Propoxyphene N-Acetaminophen] Hives and Itching   Tramadol Hives and Itching   Other Hives    OBJECTIVE: Blood pressure 119/76, pulse (!) 109, temperature 98.1 F (36.7 C), temperature source Oral, resp. rate 14, height _0  (1.88 m), weight (!) 149 kg, SpO2 92 %.  Physical Exam Constitutional:      Appearance: He is well-developed.  HENT:     Head: Normocephalic and atraumatic.     Mouth/Throat:     Dentition: Abnormal dentition. Dental caries present.     Comments: Terrible dentition Eyes:     Conjunctiva/sclera: Conjunctivae normal.  Cardiovascular:     Rate and Rhythm: Normal rate and regular rhythm.     Heart sounds: No murmur heard.    No friction rub. No gallop.  Pulmonary:     Effort: Pulmonary effort is normal. No respiratory distress.     Breath sounds: No stridor. No wheezing or rhonchi.  Abdominal:     General: There is no distension.     Palpations: Abdomen is soft.  Musculoskeletal:        General: No tenderness. Normal range of motion.     Cervical back: Normal range of motion and neck supple.  Skin:    General: Skin is warm and dry.     Coloration: Skin is not pale.     Findings: No erythema or rash.  Neurological:     Mental Status: He is alert and oriented to person, place, and  time.     Comments: He can raise both legs vs gravity from hip but much weaker on the left  Psychiatric:        Mood and Affect: Mood normal.        Behavior: Behavior normal.        Thought Content: Thought content normal.        Judgment: Judgment normal.          Lab Results Lab Results  Component Value Date   WBC 33.8 (H) 12/03/2022   HGB 13.1 12/03/2022   HCT 38.4 (L) 12/03/2022   MCV 84.2 12/03/2022   PLT 446 (H) 12/03/2022    Lab Results  Component Value Date  CREATININE 1.88 (H) 12/05/2022   BUN 89 (H) 12/05/2022   NA 149 (H) 12/05/2022   K 5.3 (H) 12/05/2022   CL 125 (H) 12/05/2022   CO2 15 (L) 12/05/2022    Lab Results  Component Value Date   ALT 15 12/03/2022   AST 17 12/03/2022   ALKPHOS 99 12/03/2022   BILITOT 1.0 12/03/2022     Microbiology: Recent Results (from the past 240 hour(s))  Resp Panel by RT-PCR (Flu A&B, Covid) Anterior Nasal Swab     Status: None   Collection Time: 12/02/22 11:30 PM   Specimen: Anterior Nasal Swab  Result Value Ref Range Status   SARS Coronavirus 2 by RT PCR NEGATIVE NEGATIVE Final    Comment: (NOTE) SARS-CoV-2 target nucleic acids are NOT DETECTED.  The SARS-CoV-2 RNA is generally detectable in upper respiratory specimens during the acute phase of infection. The lowest concentration of SARS-CoV-2 viral copies this assay can detect is 138 copies/mL. A negative result does not preclude SARS-Cov-2 infection and should not be used as the sole basis for treatment or other patient management decisions. A negative result may occur with  improper specimen collection/handling, submission of specimen other than nasopharyngeal swab, presence of viral mutation(s) within the areas targeted by this assay, and inadequate number of viral copies(<138 copies/mL). A negative result must be combined with clinical observations, patient history, and epidemiological information. The expected result is Negative.  Fact Sheet for  Patients:  EntrepreneurPulse.com.au  Fact Sheet for Healthcare Providers:  IncredibleEmployment.be  This test is no t yet approved or cleared by the [place] FDA and  has been authorized for detection and/or diagnosis of SARS-CoV-2 by FDA under an Emergency Use Authorization (EUA). This EUA will remain  in effect (meaning this test can be used) for the duration of the COVID-19 declaration under Section 564(b)(1) of the Act, 21 U.S.C.section 360bbb-3(b)(1), unless the authorization is terminated  or revoked sooner.       Influenza A by PCR NEGATIVE NEGATIVE Final   Influenza B by PCR NEGATIVE NEGATIVE Final    Comment: (NOTE) The Xpert Xpress SARS-CoV-2/FLU/RSV plus assay is intended as an aid in the diagnosis of influenza from Nasopharyngeal swab specimens and should not be used as a sole basis for treatment. Nasal washings and aspirates are unacceptable for Xpert Xpress SARS-CoV-2/FLU/RSV testing.  Fact Sheet for Patients: EntrepreneurPulse.com.au  Fact Sheet for Healthcare Providers: IncredibleEmployment.be  This test is not yet approved or cleared by the [place] FDA and has been authorized for detection and/or diagnosis of SARS-CoV-2 by FDA under an Emergency Use Authorization (EUA). This EUA will remain in effect (meaning this test can be used) for the duration of the COVID-19 declaration under Section 564(b)(1) of the Act, 21 U.S.C. section 360bbb-3(b)(1), unless the authorization is terminated or revoked.  Performed at [facility], [address], [place], [place] [address]   Blood culture (routine x 2)     Status: Abnormal (Preliminary result)   Collection Time: 12/03/22  4:00 AM   Specimen: Site Not Specified; Blood  Result Value Ref Range Status   Specimen Description   Final    SITE NOT SPECIFIED BOTTLES DRAWN AEROBIC AND ANAEROBIC Performed at [facility], [address],  [place], [place] [address]    Special Requests   Final    Blood Culture adequate volume Performed at [facility], [address] [place],  [address]    Culture  Setup Time   Final    GRAM POSITIVE  COCCI AEB BOTTLES DRAWN AEROBIC ONLY Gram Stain Report Called to,Read Back By and Verified With: REECE THOMPSON,RN _0  ON 12/04/22 BY SSLANE CRITICAL RESULT CALLED TO, READ BACK BY AND VERIFIED WITH: G ABBOTT,PHARMD_1  12/05/22 [place]    Culture (A)  Final    STAPHYLOCOCCUS AUREUS CULTURE REINCUBATED FOR BETTER GROWTH Performed at [facility], [place] [address], [place], [place] [address]    Report Status PENDING  Incomplete  Blood Culture ID Panel (Reflexed)     Status: Abnormal   Collection Time: 12/03/22  4:00 AM  Result Value Ref Range Status   Enterococcus faecalis NOT DETECTED NOT DETECTED Final   Enterococcus Faecium NOT DETECTED NOT DETECTED Final   Listeria monocytogenes NOT DETECTED NOT DETECTED Final   Staphylococcus species DETECTED (A) NOT DETECTED Final    Comment: CRITICAL RESULT CALLED TO, READ BACK BY AND VERIFIED WITH: G ABBOTT,PHARMD_2  12/05/22 [place]    Staphylococcus aureus (BCID) DETECTED (A) NOT DETECTED Final    Comment: Methicillin (oxacillin)-resistant Staphylococcus aureus (MRSA). MRSA is predictably resistant to beta-lactam antibiotics (except ceftaroline). Preferred therapy is vancomycin unless clinically contraindicated. Patient requires contact precautions if  hospitalized. CRITICAL RESULT CALLED TO, READ BACK BY AND VERIFIED WITH: G ABBOTT,PHARMD_3  12/05/22 [place]    Staphylococcus epidermidis NOT DETECTED NOT DETECTED Final   Staphylococcus lugdunensis NOT DETECTED NOT DETECTED Final   Streptococcus species NOT DETECTED NOT DETECTED Final   Streptococcus agalactiae NOT DETECTED NOT DETECTED Final   Streptococcus pneumoniae NOT DETECTED NOT DETECTED Final   Streptococcus pyogenes NOT DETECTED NOT DETECTED Final   A.calcoaceticus-baumannii NOT  DETECTED NOT DETECTED Final   Bacteroides fragilis NOT DETECTED NOT DETECTED Final   Enterobacterales NOT DETECTED NOT DETECTED Final   Enterobacter cloacae complex NOT DETECTED NOT DETECTED Final   Escherichia coli NOT DETECTED NOT DETECTED Final   Klebsiella aerogenes NOT DETECTED NOT DETECTED Final   Klebsiella oxytoca NOT DETECTED NOT DETECTED Final   Klebsiella pneumoniae NOT DETECTED NOT DETECTED Final   Proteus species NOT DETECTED NOT DETECTED Final   Salmonella species NOT DETECTED NOT DETECTED Final   Serratia marcescens NOT DETECTED NOT DETECTED Final   Haemophilus influenzae NOT DETECTED NOT DETECTED Final   Neisseria meningitidis NOT DETECTED NOT DETECTED Final   Pseudomonas aeruginosa NOT DETECTED NOT DETECTED Final   Stenotrophomonas maltophilia NOT DETECTED NOT DETECTED Final   Candida albicans NOT DETECTED NOT DETECTED Final   Candida auris NOT DETECTED NOT DETECTED Final   Candida glabrata NOT DETECTED NOT DETECTED Final   Candida krusei NOT DETECTED NOT DETECTED Final   Candida parapsilosis NOT DETECTED NOT DETECTED Final   Candida tropicalis NOT DETECTED NOT DETECTED Final   Cryptococcus neoformans/gattii NOT DETECTED NOT DETECTED Final   Meth resistant mecA/C and MREJ DETECTED (A) NOT DETECTED Final    Comment: CRITICAL RESULT CALLED TO, READ BACK BY AND VERIFIED WITH: G ABBOTT,PHARMD_4  12/05/22 [place] Performed at [facility], [address] [place], [place] [address]   Urine Culture     Status: Abnormal   Collection Time: 12/03/22  4:01 AM   Specimen: Urine, Catheterized  Result Value Ref Range Status   Specimen Description   Final    URINE, CATHETERIZED Performed at [facility], [address] [place], [place] [address]    Special Requests   Final    NONE Performed at [facility], [address] [place],  [address]    Culture (A)  Final    20,000 COLONIES/mL METHICILLIN RESISTANT STAPHYLOCOCCUS AUREUS  Report Status 12/05/2022 FINAL   Final   Organism ID, Bacteria METHICILLIN RESISTANT STAPHYLOCOCCUS AUREUS (A)  Final      Susceptibility   Methicillin resistant staphylococcus aureus - MIC*    CIPROFLOXACIN >=8 RESISTANT Resistant     GENTAMICIN <=0.5 SENSITIVE Sensitive     NITROFURANTOIN 32 SENSITIVE Sensitive     OXACILLIN >=4 RESISTANT Resistant     TETRACYCLINE <=1 SENSITIVE Sensitive     VANCOMYCIN <=0.5 SENSITIVE Sensitive     TRIMETH/SULFA <=10 SENSITIVE Sensitive     CLINDAMYCIN <=0.25 SENSITIVE Sensitive     RIFAMPIN <=0.5 SENSITIVE Sensitive     Inducible Clindamycin NEGATIVE Sensitive     * 20,000 COLONIES/mL METHICILLIN RESISTANT STAPHYLOCOCCUS AUREUS  Blood culture (routine x 2)     Status: None (Preliminary result)   Collection Time: 12/03/22  4:10 AM   Specimen: Site Not Specified; Blood  Result Value Ref Range Status   Specimen Description   Final    SITE NOT SPECIFIED BOTTLES DRAWN AEROBIC AND ANAEROBIC   Special Requests Blood Culture adequate volume  Final   Culture   Final    NO GROWTH 2 DAYS Performed at [facility], [address] [place], [place] [address]    Report Status PENDING  Incomplete    Alcide Evener, MD [facility] [phone] pager  12/05/2022, 10:29 AM

## 2022-12-05 NOTE — Progress Notes (Signed)
Patient ID: Hunter Reyes, male   DOB: 09-21-1976, 46 y.o.   MRN: [id number] S:  Thirsty 4.1L UOP SCr down to 1.9, BUN to 89 B Cx + for MRSA  O:BP 119/76 (BP Location: Right Arm)   Pulse (!) 109   Temp 98.1 F (36.7 C) (Oral)   Resp 14   Ht 6\' 2"  (1.88 m)   Wt (!) 149 kg   SpO2 92%   BMI 42.18 kg/m   Intake/Output Summary (Last 24 hours) at 12/05/2022 1309 Last data filed at 12/05/2022 0958 Gross per 24 hour  Intake 1727.52 ml  Output 5050 ml  Net -3322.48 ml   Intake/Output: I/O last 3 completed shifts: In: 6904.1 [P.O.:2065; I.V.:4640.9; IV Piggyback:198.2] Out: 0160 [Urine:7875]  Intake/Output this shift:  Total I/O In: -  Out: 1400 [Urine:1400] Weight change:  Gen: NAD CVS: tachy regular, no rub Resp:CTA Abd: +BS, soft, NT/ND Ext: no edema  Recent Labs  Lab 12/03/22 0007 12/03/22 0553 12/04/22 1007 12/05/22 0524  NA 129* 133* 148* 149*  K 5.8* 5.0 5.1 5.3*  CL 92* 104 122* 125*  CO2 11* 11* 15* 15*  GLUCOSE 95 105* 110* 110*  BUN >300* 184* 118* 89*  CREATININE 20.99* 14.78* 3.41* 1.88*  ALBUMIN 2.7*  --  2.4* 2.3*  CALCIUM 8.8* 8.7* 8.9 8.8*  PHOS  --   --  5.7* 4.7*  AST 17  --   --   --   ALT 15  --   --   --    Liver Function Tests: Recent Labs  Lab 12/03/22 0007 12/04/22 1007 12/05/22 0524  AST 17  --   --   ALT 15  --   --   ALKPHOS 99  --   --   BILITOT 1.0  --   --   PROT 8.9*  --   --   ALBUMIN 2.7* 2.4* 2.3*   Recent Labs  Lab 12/03/22 0007  LIPASE 222*   No results for input(s): "AMMONIA" in the last 168 hours. CBC: Recent Labs  Lab 12/03/22 0007 12/03/22 0500  WBC 32.2* 33.8*  NEUTROABS 27.0*  --   HGB 12.8* 13.1  HCT 37.1* 38.4*  MCV 82.8 84.2  PLT 411* 446*   Cardiac Enzymes: Recent Labs  Lab 12/03/22 0007 12/05/22 1121  CKTOTAL 40* 404*   CBG: No results for input(s): "GLUCAP" in the last 168 hours.  Iron Studies: No results for input(s): "IRON", "TIBC", "TRANSFERRIN", "FERRITIN" in the last 72  hours. Studies/Results: ECHOCARDIOGRAM COMPLETE  Result Date: 12/05/2022    ECHOCARDIOGRAM REPORT   Patient Name:   Hunter Reyes Date of Exam: 12/05/2022 Medical Rec #:  [id number]         Height:       74.0 in Accession #:    [id number]        Weight:       328.5 lb Date of Birth:  02-13-1976         BSA:          2.684 m Patient Age:    61 years          BP:           118/76 mmHg Patient Gender: M                 HR:           88 bpm. Exam Location:  Inpatient Procedure: 2D Echo, Cardiac Doppler and Color Doppler Indications:  Bacteremia R78.81  History:        Patient has no prior history of Echocardiogram examinations.                 COPD.  Sonographer:    Darlina Sicilian RDCS Referring Phys: [place]  1. Left ventricular ejection fraction, by estimation, is 60 to 65%. The left ventricle has normal function. The left ventricle has no regional wall motion abnormalities. Left ventricular diastolic parameters were normal.  2. Right ventricular systolic function is normal. The right ventricular size is normal. Tricuspid regurgitation signal is inadequate for assessing PA pressure.  3. The interatrial septum appears aneurysmal. Cannot exclude a possible PFO by color doppler.  4. The mitral valve is normal in structure. No evidence of mitral valve regurgitation. No evidence of mitral stenosis.  5. The aortic valve is tricuspid. Aortic valve regurgitation is not visualized. No aortic stenosis is present.  6. Aortic dilatation noted. There is borderline dilatation of the aortic root, measuring 36 mm.  7. The inferior vena cava is normal in size with greater than 50% respiratory variability, suggesting right atrial pressure of 3 mmHg. Conclusion(s)/Recommendation(s): No evidence of valvular vegetations on this transthoracic echocardiogram. Consider a transesophageal echocardiogram to exclude infective endocarditis if clinically indicated. FINDINGS  Left Ventricle: Left ventricular  ejection fraction, by estimation, is 60 to 65%. The left ventricle has normal function. The left ventricle has no regional wall motion abnormalities. The left ventricular internal cavity size was normal in size. There is  no left ventricular hypertrophy. Left ventricular diastolic parameters were normal. Right Ventricle: The right ventricular size is normal. No increase in right ventricular wall thickness. Right ventricular systolic function is normal. Tricuspid regurgitation signal is inadequate for assessing PA pressure. Left Atrium: Left atrial size was normal in size. Right Atrium: Right atrial size was normal in size. Pericardium: There is no evidence of pericardial effusion. Mitral Valve: The mitral valve is normal in structure. No evidence of mitral valve regurgitation. No evidence of mitral valve stenosis. Tricuspid Valve: The tricuspid valve is grossly normal. Tricuspid valve regurgitation is not demonstrated. No evidence of tricuspid stenosis. Aortic Valve: The aortic valve is tricuspid. Aortic valve regurgitation is not visualized. No aortic stenosis is present. Pulmonic Valve: The pulmonic valve was grossly normal. Pulmonic valve regurgitation is not visualized. No evidence of pulmonic stenosis. Aorta: Aortic dilatation noted and the aortic root is normal in size and structure. There is borderline dilatation of the aortic root, measuring 36 mm. Venous: The inferior vena cava is normal in size with greater than 50% respiratory variability, suggesting right atrial pressure of 3 mmHg. IAS/Shunts: The interatrial septum is aneurysmal. The atrial septum is grossly normal.  LEFT VENTRICLE PLAX 2D LVIDd:         5.10 cm   Diastology LVIDs:         3.30 cm   LV e' medial:    7.62 cm/s LV PW:         0.80 cm   LV E/e' medial:  7.5 LV IVS:        0.90 cm   LV e' lateral:   16.30 cm/s LVOT diam:     2.50 cm   LV E/e' lateral: 3.5 LV SV:         72 LV SV Index:   27 LVOT Area:     4.91 cm  RIGHT VENTRICLE RV S  prime:     18.00 cm/s TAPSE (M-mode): 1.8  cm LEFT ATRIUM             Index        RIGHT ATRIUM           Index LA Vol (A2C):   38.8 ml 14.46 ml/m  RA Area:     14.10 cm LA Vol (A4C):   21.5 ml 8.01 ml/m   RA Volume:   30.30 ml  11.29 ml/m LA Biplane Vol: 29.0 ml 10.81 ml/m  AORTIC VALVE LVOT Vmax:   80.90 cm/s LVOT Vmean:  52.000 cm/s LVOT VTI:    0.146 m  AORTA Ao Root diam: 3.60 cm MITRAL VALVE MV Area (PHT): 3.45 cm    SHUNTS MV Decel Time: 220 msec    Systemic VTI:  0.15 m MV E velocity: 57.40 cm/s  Systemic Diam: 2.50 cm MV A velocity: 74.60 cm/s MV E/A ratio:  0.77 Eleonore Chiquito MD Electronically signed by Eleonore Chiquito MD Signature Date/Time: 12/05/2022/10:33:02 AM    Final     buprenorphine-naloxone  1 tablet Sublingual BID   Chlorhexidine Gluconate Cloth  6 each Topical Q0600   gabapentin  600 mg Oral BID   LORazepam  1 mg Intravenous Once   sertraline  25 mg Oral Daily    BMET    Component Value Date/Time   NA 149 (H) 12/05/2022 0524   K 5.3 (H) 12/05/2022 0524   CL 125 (H) 12/05/2022 0524   CO2 15 (L) 12/05/2022 0524   GLUCOSE 110 (H) 12/05/2022 0524   BUN 89 (H) 12/05/2022 0524   CREATININE 1.88 (H) 12/05/2022 0524   CREATININE 0.97 02/06/2016 1458   CALCIUM 8.8 (L) 12/05/2022 0524   GFRNONAA 44 (L) 12/05/2022 0524   GFRAA >60 09/28/2017 1344   CBC    Component Value Date/Time   WBC 33.8 (H) 12/03/2022 0500   RBC 4.56 12/03/2022 0500   HGB 13.1 12/03/2022 0500   HCT 38.4 (L) 12/03/2022 0500   PLT 446 (H) 12/03/2022 0500   MCV 84.2 12/03/2022 0500   MCH 28.7 12/03/2022 0500   MCHC 34.1 12/03/2022 0500   RDW 15.5 12/03/2022 0500   LYMPHSABS 1.0 12/03/2022 0007   MONOABS 4.2 (H) 12/03/2022 0007   EOSABS 0.0 12/03/2022 0007   BASOSABS 0.0 12/03/2022 0007     Assessment/Plan:  AKI- due to obstructive uropathy.  After foley catheter placed, he has had tremendous UOP.  BUN/Cr rapidly improving. Will not need RRT.  Should further recover in time.  No further  suggestion.  Will s/o at this time Hyperkalemia - due to AKI, improved  Metabolic acidosis - due to AKI. Stable and improved,   Diskitis with MRSA bacteremia - per [facility].  Dapto.  Workup in process Urinary retention - likely due to diskitis.  Continue with foley catheter.  Will need trial ov voiding vs long term foley once infectious issues addressed Bipolar disorder - continue with home meds HBV S Ag positive, per ID  Rexene Agent, MD  [facility]

## 2022-12-05 NOTE — Progress Notes (Signed)
PHARMACY - PHYSICIAN COMMUNICATION CRITICAL VALUE ALERT - BLOOD CULTURE IDENTIFICATION (BCID)  Hunter Reyes is an 46 y.o. male who presented to [facility] on 12/02/2022 with a chief complaint of back pain/diskitis and AKI  Assessment:   1/2 blood cultures growing MRSA  Name of physician (or Provider) Contacted:  Dr. Bridgett Larsson  Current antibiotics:  Vancomycin Cefepime and Flagyl  Changes to prescribed antibiotics recommended:  D/C Cefepime and Flagyl F/U Vancomycin level in am and redose as indicated  Results for orders placed or performed during the hospital encounter of 12/02/22  Blood Culture ID Panel (Reflexed) (Collected: 12/03/2022  4:00 AM)  Result Value Ref Range   Enterococcus faecalis NOT DETECTED NOT DETECTED   Enterococcus Faecium NOT DETECTED NOT DETECTED   Listeria monocytogenes NOT DETECTED NOT DETECTED   Staphylococcus species DETECTED (A) NOT DETECTED   Staphylococcus aureus (BCID) DETECTED (A) NOT DETECTED   Staphylococcus epidermidis NOT DETECTED NOT DETECTED   Staphylococcus lugdunensis NOT DETECTED NOT DETECTED   Streptococcus species NOT DETECTED NOT DETECTED   Streptococcus agalactiae NOT DETECTED NOT DETECTED   Streptococcus pneumoniae NOT DETECTED NOT DETECTED   Streptococcus pyogenes NOT DETECTED NOT DETECTED   A.calcoaceticus-baumannii NOT DETECTED NOT DETECTED   Bacteroides fragilis NOT DETECTED NOT DETECTED   Enterobacterales NOT DETECTED NOT DETECTED   Enterobacter cloacae complex NOT DETECTED NOT DETECTED   Escherichia coli NOT DETECTED NOT DETECTED   Klebsiella aerogenes NOT DETECTED NOT DETECTED   Klebsiella oxytoca NOT DETECTED NOT DETECTED   Klebsiella pneumoniae NOT DETECTED NOT DETECTED   Proteus species NOT DETECTED NOT DETECTED   Salmonella species NOT DETECTED NOT DETECTED   Serratia marcescens NOT DETECTED NOT DETECTED   Haemophilus influenzae NOT DETECTED NOT DETECTED   Neisseria meningitidis NOT DETECTED NOT DETECTED    Pseudomonas aeruginosa NOT DETECTED NOT DETECTED   Stenotrophomonas maltophilia NOT DETECTED NOT DETECTED   Candida albicans NOT DETECTED NOT DETECTED   Candida auris NOT DETECTED NOT DETECTED   Candida glabrata NOT DETECTED NOT DETECTED   Candida krusei NOT DETECTED NOT DETECTED   Candida parapsilosis NOT DETECTED NOT DETECTED   Candida tropicalis NOT DETECTED NOT DETECTED   Cryptococcus neoformans/gattii NOT DETECTED NOT DETECTED   Meth resistant mecA/C and MREJ DETECTED (A) NOT DETECTED    Caryl Pina 12/05/2022  2:23 AM

## 2022-12-06 ENCOUNTER — Encounter (HOSPITAL_COMMUNITY): Payer: Self-pay | Admitting: Neurosurgery

## 2022-12-06 ENCOUNTER — Inpatient Hospital Stay (HOSPITAL_COMMUNITY): Payer: Medicaid Other

## 2022-12-06 DIAGNOSIS — G834 Cauda equina syndrome: Secondary | ICD-10-CM

## 2022-12-06 DIAGNOSIS — M4647 Discitis, unspecified, lumbosacral region: Secondary | ICD-10-CM | POA: Diagnosis not present

## 2022-12-06 DIAGNOSIS — R7881 Bacteremia: Secondary | ICD-10-CM | POA: Diagnosis not present

## 2022-12-06 DIAGNOSIS — B181 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: Secondary | ICD-10-CM | POA: Diagnosis not present

## 2022-12-06 DIAGNOSIS — B9562 Methicillin resistant Staphylococcus aureus infection as the cause of diseases classified elsewhere: Secondary | ICD-10-CM | POA: Diagnosis not present

## 2022-12-06 DIAGNOSIS — N179 Acute kidney failure, unspecified: Secondary | ICD-10-CM | POA: Diagnosis not present

## 2022-12-06 DIAGNOSIS — F319 Bipolar disorder, unspecified: Secondary | ICD-10-CM | POA: Diagnosis not present

## 2022-12-06 LAB — CBC
HCT: 33.4 % — ABNORMAL LOW (ref 39.0–52.0)
Hemoglobin: 11.1 g/dL — ABNORMAL LOW (ref 13.0–17.0)
MCH: 29.3 pg (ref 26.0–34.0)
MCHC: 33.2 g/dL (ref 30.0–36.0)
MCV: 88.1 fL (ref 80.0–100.0)
Platelets: 321 10*3/uL (ref 150–400)
RBC: 3.79 MIL/uL — ABNORMAL LOW (ref 4.22–5.81)
RDW: 15.4 % (ref 11.5–15.5)
WBC: 34.3 10*3/uL — ABNORMAL HIGH (ref 4.0–10.5)
nRBC: 0 % (ref 0.0–0.2)

## 2022-12-06 LAB — RENAL FUNCTION PANEL
Albumin: 2.2 g/dL — ABNORMAL LOW (ref 3.5–5.0)
Anion gap: 9 (ref 5–15)
BUN: 61 mg/dL — ABNORMAL HIGH (ref 6–20)
CO2: 15 mmol/L — ABNORMAL LOW (ref 22–32)
Calcium: 8.3 mg/dL — ABNORMAL LOW (ref 8.9–10.3)
Chloride: 124 mmol/L — ABNORMAL HIGH (ref 98–111)
Creatinine, Ser: 1.48 mg/dL — ABNORMAL HIGH (ref 0.61–1.24)
GFR, Estimated: 59 mL/min — ABNORMAL LOW (ref >60)
Glucose, Bld: 125 mg/dL — ABNORMAL HIGH (ref 70–99)
Phosphorus: 4.6 mg/dL (ref 2.5–4.6)
Potassium: 5.4 mmol/L — ABNORMAL HIGH (ref 3.5–5.1)
Sodium: 148 mmol/L — ABNORMAL HIGH (ref 135–145)

## 2022-12-06 LAB — GLUCOSE, CAPILLARY: Glucose-Capillary: 110 mg/dL — ABNORMAL HIGH (ref 70–99)

## 2022-12-06 LAB — HIV-1 RNA QUANT-NO REFLEX-BLD
HIV 1 RNA Quant: <20 copies/mL
LOG10 HIV-1 RNA: UNDETERMINED log10copy/mL

## 2022-12-06 LAB — HEPATITIS B E ANTIGEN: Hep B E Ag: NEGATIVE

## 2022-12-06 LAB — CK: Total CK: 201 U/L (ref 49–397)

## 2022-12-06 LAB — BASIC METABOLIC PANEL
Anion gap: 8 (ref 5–15)
BUN: 53 mg/dL — ABNORMAL HIGH (ref 6–20)
CO2: 18 mmol/L — ABNORMAL LOW (ref 22–32)
Calcium: 8.2 mg/dL — ABNORMAL LOW (ref 8.9–10.3)
Chloride: 119 mmol/L — ABNORMAL HIGH (ref 98–111)
Creatinine, Ser: 1.2 mg/dL (ref 0.61–1.24)
GFR, Estimated: >60 mL/min (ref >60)
Glucose, Bld: 139 mg/dL — ABNORMAL HIGH (ref 70–99)
Potassium: 3.6 mmol/L (ref 3.5–5.1)
Sodium: 145 mmol/L (ref 135–145)

## 2022-12-06 LAB — HEPATITIS A ANTIBODY, TOTAL: hep A Total Ab: NONREACTIVE

## 2022-12-06 LAB — HCV AB W REFLEX TO QUANT PCR: HCV Ab: NONREACTIVE

## 2022-12-06 LAB — HEPATITIS B E ANTIBODY: Hep B E Ab: NEGATIVE

## 2022-12-06 LAB — HCV INTERPRETATION

## 2022-12-06 MED ORDER — ENOXAPARIN SODIUM 40 MG/0.4ML IJ SOSY
40.0000 mg | PREFILLED_SYRINGE | Freq: Every day | INTRAMUSCULAR | Status: DC
  Administered 2022-12-07 – 2022-12-08 (×2): 40 mg via SUBCUTANEOUS
  Filled 2022-12-06 (×2): qty 0.4

## 2022-12-06 MED ORDER — SODIUM BICARBONATE 8.4 % IV SOLN
INTRAVENOUS | Status: AC
  Filled 2022-12-06 (×3): qty 1000

## 2022-12-06 MED ORDER — SODIUM ZIRCONIUM CYCLOSILICATE 10 G PO PACK
10.0000 g | PACK | Freq: Once | ORAL | Status: AC
  Administered 2022-12-06: 10 g via ORAL

## 2022-12-06 MED ORDER — THIAMINE HCL 100 MG/ML IJ SOLN
500.0000 mg | Freq: Every day | INTRAVENOUS | Status: DC
  Administered 2022-12-06 – 2022-12-08 (×3): 500 mg via INTRAVENOUS
  Filled 2022-12-06 (×4): qty 5

## 2022-12-06 NOTE — TOC Initial Note (Signed)
Transition of Care [facility]) - Initial/Assessment Note    Patient Details  Name: Hunter Reyes MRN: [id number] Date of Birth: Aug 21, 1976  [facility] CM/SW Contact:    Cyndi Bender, RN Phone Number: 12/06/2022, 2:17 PM  Clinical Narrative:                 Dr. Tommy Medal recommending 6-8 weeks of IV antibiotics and due to history of drug abuse patient will need to stay in hospital to get the IV antibiotics.  TOC will continue to follow for needs.   Expected Discharge Plan: [place] Barriers to Discharge: Continued Medical Work up   Patient Goals and CMS Choice Patient states their goals for this hospitalization and ongoing recovery are:: return home      Expected Discharge Plan and Services Expected Discharge Plan: [place]       Living arrangements for the past 2 months: [place]                                      Prior Living Arrangements/Services Living arrangements for the past 2 months: Single Family Home                     Activities of Daily Living Home Assistive Devices/Equipment: None ADL Screening (condition at time of admission) Patient's cognitive ability adequate to safely complete daily activities?: Yes Is the patient deaf or have difficulty hearing?: No Does the patient have difficulty seeing, even when wearing glasses/contacts?: No Does the patient have difficulty concentrating, remembering, or making decisions?: No Patient able to express need for assistance with ADLs?: Yes Does the patient have difficulty dressing or bathing?: Yes Independently performs ADLs?: No Communication: Independent Dressing (OT): Needs assistance Grooming: Needs assistance Feeding: Independent Bathing: Needs assistance Toileting: Needs assistance In/Out Bed: Dependent Walks in Home: Needs assistance Does the patient have difficulty walking or climbing stairs?: Yes Weakness of Legs:  Both Weakness of Arms/Hands: Both  Permission Sought/Granted                  Emotional Assessment              Admission diagnosis:  AKI (acute kidney injury) ([place]) [N17.9] Acute renal failure, unspecified acute renal failure type ([place]) [N17.9] Patient Active Problem List   Diagnosis Date Noted   MRSA bacteremia 12/05/2022   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 ([place]) 40/98/1191   Metabolic acidosis 47/82/9562   AKI (acute kidney injury) ([place]) 12/03/2022   Diskitis 12/03/2022   Bipolar 1 disorder ([place]) 12/03/2022   Peripheral neuropathy 12/03/2022   Polysubstance abuse ([place]) 12/03/2022   Dyspnea on exertion 09/30/2017   Breast mass in male    Loss of weight 04/30/2013   Tobacco abuse    Foot ulcer ([place]) 04/29/2013   Osteomyelitis ([place]) 04/29/2013   Obesity, unspecified 04/29/2013   PCP:  Celene Squibb, MD Pharmacy:   [place], [place] [address] [place] [address] Phone: [phone] Fax: [phone]  [facility] [address] [place] [address] Phone: [phone] Fax: [phone]     Social Determinants of Health (SDOH) Interventions    Readmission Risk Interventions     No data to display

## 2022-12-06 NOTE — Progress Notes (Signed)
PROGRESS NOTE    [name]  YWV:[id number] DOB: 06/08/1976 DOA: 12/02/2022 PCP: Celene Squibb, MD     Chief Complaint  Patient presents with   Urinary Retention   Back Pain    Brief Narrative:  Hunter Reyes is a 46 y.o. Caucasian male with medical history significant for bipolar 1 disorder, borderline personality disorder, COPD, crack cocaine abuse, depression, peripheral neuropathy, history of right foot osteomyelitis, panic attack and history of seizures, who presented to the emergency room with acute onset of altered mental status with associated urinary retention and low back pain.  The patient has not urinated for 3 days.  Hlabs hyperkalemia 5.8, BUN more than 300 and creatinine 20.99 with calcium of 8.8 anion gap of 26, albumin of 2.7 and total protein of 8.9.  Serum lipase was 222.  Lactic acid was 1.2 and CK 40.  CBC showed leukocytosis of 32.2 with neutrophilia and thrombocytosis of 411 with mild anemia with hemoglobin of 12.8 hematocrit 27.1.  Previous BUN and creatinine were 8 and 0.95 however on 09/28/2017.  Assessment & Plan:   Principal Problem:   MRSA bacteremia Active Problems:   AKI (acute kidney injury) ([place])   Diskitis   Bipolar 1 disorder ([place])   Peripheral neuropathy   Polysubstance abuse (HCC)   Metabolic acidosis   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 (HCC)    AKI (acute kidney injury) ([place]) Urinary retention - his is likely prerenal due to severe dehydration as well as obstructive uropathy and the use of nephrotoxic agents (NSAIDs). -At time of admission BUN about 300 and creatinine more than 20. -After Foley catheter placed and fluid resuscitation provided  -Function continues to improve, creatinine is 1.48 today, -Continue to monitor electrolyte closely for postobstructive diuresis - continue with flomax  Hyperkalemia  - Due to AKI, will give another Lokelma today  Hypernatremia Metabolic acidosis  -Continue with bicarb  W  Diskitis/MRSA bacteremia/epidural abscess -Discitis as evident on imaging -Patient appears with lower extremity weakness, urinary retention, possible incontinence, unclear acuity, as report last time he was able to stand up was a month ago -Management per ID, vancomycin has been switched to daptomycin -  sp Right L4 laminotomy, sublaminar decompression with debridement of ventral epidural abscess  by Dr. Kathyrn Sheriff  -Concern of both feet having osteomyelitis, could be the source, MRI is pending. -Will need TEE at 1 point when more stable.   Peripheral neuropathy -Continue Neurontin and adjusted dose   Bipolar 1 disorder ([place]) -No suicidal ideation or hallucination -Stable mood currently -Continue Abilify-antidepressant therapy.   Polysubstance abuse ([place]) -This includes cocaine and likely opiates. -Continue the use of Suboxone   Bilateral heel ulcers POA  - cont with wound care, MRI of both feet tomorrow.    Hypomagnesemia Hypophosphatemia -Repleted  Hepatitis B infection -ID is following    DVT prophylaxis: Lovenox Code Status: Full Family Communication: None at bedside Disposition:   Status is: Inpatient    Consultants:  ID Neurosurgery  Subjective:  No further diarrhea/stool incontinence per staff, patient unable to provide reliable complaints, reports he is thirsty, when to drink water. Objective: Vitals:   12/06/22 0600 12/06/22 0800 12/06/22 1000 12/06/22 1200  BP:  124/64  138/71  Pulse: 95 96  90  Resp: 14 11 11 11   Temp:  99 F (37.2 C)  (!) 97.5 F (36.4 C)  TempSrc:  Oral  Axillary  SpO2: 92%   94%  Weight:      Height:  Intake/Output Summary (Last 24 hours) at 12/06/2022 1440 Last data filed at 12/06/2022 1242 Gross per 24 hour  Intake 1860 ml  Output 5730 ml  Net -3870 ml   Filed Weights   12/02/22 2230  Weight: (!) 149 kg    Examination:   Awake Alert, confused, impaired cognition and insight Symmetrical Chest wall  movement, diminished air entry at the bases RRR,No Gallops,Rubs or new Murmurs, No Parasternal Heave +ve B.Sounds, Abd Soft, No tenderness, No rebound - guarding or rigidity. No Cyanosis, Clubbing or edema, bilateral lower extremity with improvement in strength.    Data Reviewed: I have personally reviewed following labs and imaging studies  CBC: Recent Labs  Lab 12/03/22 0007 12/03/22 0500 12/06/22 0451  WBC 32.2* 33.8* 34.3*  NEUTROABS 27.0*  --   --   HGB 12.8* 13.1 11.1*  HCT 37.1* 38.4* 33.4*  MCV 82.8 84.2 88.1  PLT 411* 446* 951    Basic Metabolic Panel: Recent Labs  Lab 12/03/22 0007 12/03/22 0553 12/04/22 1007 12/05/22 0524 12/06/22 0451  NA 129* 133* 148* 149* 148*  K 5.8* 5.0 5.1 5.3* 5.4*  CL 92* 104 122* 125* 124*  CO2 11* 11* 15* 15* 15*  GLUCOSE 95 105* 110* 110* 125*  BUN >300* 184* 118* 89* 61*  CREATININE 20.99* 14.78* 3.41* 1.88* 1.48*  CALCIUM 8.8* 8.7* 8.9 8.8* 8.3*  PHOS  --   --  5.7* 4.7* 4.6    GFR: Estimated Creatinine Clearance: 97.1 mL/min (A) (by C-G formula based on SCr of 1.48 mg/dL (H)).  Liver Function Tests: Recent Labs  Lab 12/03/22 0007 12/04/22 1007 12/05/22 0524 12/06/22 0451  AST 17  --   --   --   ALT 15  --   --   --   ALKPHOS 99  --   --   --   BILITOT 1.0  --   --   --   PROT 8.9*  --   --   --   ALBUMIN 2.7* 2.4* 2.3* 2.2*    CBG: No results for input(s): "GLUCAP" in the last 168 hours.   Recent Results (from the past 240 hour(s))  Resp Panel by RT-PCR (Flu A&B, Covid) Anterior Nasal Swab     Status: None   Collection Time: 12/02/22 11:30 PM   Specimen: Anterior Nasal Swab  Result Value Ref Range Status   SARS Coronavirus 2 by RT PCR NEGATIVE NEGATIVE Final    Comment: (NOTE) SARS-CoV-2 target nucleic acids are NOT DETECTED.  The SARS-CoV-2 RNA is generally detectable in upper respiratory specimens during the acute phase of infection. The lowest concentration of SARS-CoV-2 viral copies this assay  can detect is 138 copies/mL. A negative result does not preclude SARS-Cov-2 infection and should not be used as the sole basis for treatment or other patient management decisions. A negative result may occur with  improper specimen collection/handling, submission of specimen other than nasopharyngeal swab, presence of viral mutation(s) within the areas targeted by this assay, and inadequate number of viral copies(<138 copies/mL). A negative result must be combined with clinical observations, patient history, and epidemiological information. The expected result is Negative.  Fact Sheet for Patients:  EntrepreneurPulse.com.au  Fact Sheet for Healthcare Providers:  IncredibleEmployment.be  This test is no t yet approved or cleared by the [place] FDA and  has been authorized for detection and/or diagnosis of SARS-CoV-2 by FDA under an Emergency Use Authorization (EUA). This EUA will remain  in effect (meaning this test can  be used) for the duration of the COVID-19 declaration under Section 564(b)(1) of the Act, 21 U.S.C.section 360bbb-3(b)(1), unless the authorization is terminated  or revoked sooner.       Influenza A by PCR NEGATIVE NEGATIVE Final   Influenza B by PCR NEGATIVE NEGATIVE Final    Comment: (NOTE) The Xpert Xpress SARS-CoV-2/FLU/RSV plus assay is intended as an aid in the diagnosis of influenza from Nasopharyngeal swab specimens and should not be used as a sole basis for treatment. Nasal washings and aspirates are unacceptable for Xpert Xpress SARS-CoV-2/FLU/RSV testing.  Fact Sheet for Patients: EntrepreneurPulse.com.au  Fact Sheet for Healthcare Providers: IncredibleEmployment.be  This test is not yet approved or cleared by the [place] FDA and has been authorized for detection and/or diagnosis of SARS-CoV-2 by FDA under an Emergency Use Authorization (EUA). This EUA will  remain in effect (meaning this test can be used) for the duration of the COVID-19 declaration under Section 564(b)(1) of the Act, 21 U.S.C. section 360bbb-3(b)(1), unless the authorization is terminated or revoked.  Performed at [facility], [address], [place], [place] [address]   Blood culture (routine x 2)     Status: Abnormal (Preliminary result)   Collection Time: 12/03/22  4:00 AM   Specimen: Site Not Specified; Blood  Result Value Ref Range Status   Specimen Description   Final    SITE NOT SPECIFIED BOTTLES DRAWN AEROBIC AND ANAEROBIC Performed at [facility], [address], [place], [place] [address]    Special Requests   Final    Blood Culture adequate volume Performed at [address] [place], [place] [address]    Culture  Setup Time   Final    GRAM POSITIVE COCCI AEB BOTTLES DRAWN AEROBIC ONLY Gram Stain Report Called to,Read Back By and Verified With: REECE THOMPSON,RN @2143  ON 12/04/22 BY SSLANE CRITICAL RESULT CALLED TO, READ BACK BY AND VERIFIED WITH: G ABBOTT,PHARMD@0200  12/05/22 [place]    Culture (A)  Final    STAPHYLOCOCCUS AUREUS SUSCEPTIBILITIES TO FOLLOW Performed at [facility] [address] [place], [place] [address]    Report Status PENDING  Incomplete  Blood Culture ID Panel (Reflexed)     Status: Abnormal   Collection Time: 12/03/22  4:00 AM  Result Value Ref Range Status   Enterococcus faecalis NOT DETECTED NOT DETECTED Final   Enterococcus Faecium NOT DETECTED NOT DETECTED Final   Listeria monocytogenes NOT DETECTED NOT DETECTED Final   Staphylococcus species DETECTED (A) NOT DETECTED Final    Comment: CRITICAL RESULT CALLED TO, READ BACK BY AND VERIFIED WITH: G ABBOTT,PHARMD@0200  12/05/22 [place]    Staphylococcus aureus (BCID) DETECTED (A) NOT DETECTED Final    Comment: Methicillin (oxacillin)-resistant Staphylococcus aureus (MRSA). MRSA is predictably resistant to beta-lactam antibiotics (except ceftaroline). Preferred  therapy is vancomycin unless clinically contraindicated. Patient requires contact precautions if  hospitalized. CRITICAL RESULT CALLED TO, READ BACK BY AND VERIFIED WITH: G ABBOTT,PHARMD@0200  12/05/22 [place]    Staphylococcus epidermidis NOT DETECTED NOT DETECTED Final   Staphylococcus lugdunensis NOT DETECTED NOT DETECTED Final   Streptococcus species NOT DETECTED NOT DETECTED Final   Streptococcus agalactiae NOT DETECTED NOT DETECTED Final   Streptococcus pneumoniae NOT DETECTED NOT DETECTED Final   Streptococcus pyogenes NOT DETECTED NOT DETECTED Final   A.calcoaceticus-baumannii NOT DETECTED NOT DETECTED Final   Bacteroides fragilis NOT DETECTED NOT DETECTED Final   Enterobacterales NOT DETECTED NOT DETECTED Final   Enterobacter cloacae complex NOT DETECTED NOT DETECTED Final   Escherichia  coli NOT DETECTED NOT DETECTED Final   Klebsiella aerogenes NOT DETECTED NOT DETECTED Final   Klebsiella oxytoca NOT DETECTED NOT DETECTED Final   Klebsiella pneumoniae NOT DETECTED NOT DETECTED Final   Proteus species NOT DETECTED NOT DETECTED Final   Salmonella species NOT DETECTED NOT DETECTED Final   Serratia marcescens NOT DETECTED NOT DETECTED Final   Haemophilus influenzae NOT DETECTED NOT DETECTED Final   Neisseria meningitidis NOT DETECTED NOT DETECTED Final   Pseudomonas aeruginosa NOT DETECTED NOT DETECTED Final   Stenotrophomonas maltophilia NOT DETECTED NOT DETECTED Final   Candida albicans NOT DETECTED NOT DETECTED Final   Candida auris NOT DETECTED NOT DETECTED Final   Candida glabrata NOT DETECTED NOT DETECTED Final   Candida krusei NOT DETECTED NOT DETECTED Final   Candida parapsilosis NOT DETECTED NOT DETECTED Final   Candida tropicalis NOT DETECTED NOT DETECTED Final   Cryptococcus neoformans/gattii NOT DETECTED NOT DETECTED Final   Meth resistant mecA/C and MREJ DETECTED (A) NOT DETECTED Final    Comment: CRITICAL RESULT CALLED TO, READ BACK BY AND VERIFIED WITH: G  ABBOTT,PHARMD@0200  12/05/22 [place] Performed at [facility], [address], [place], [place] [address]   Urine Culture     Status: Abnormal   Collection Time: 12/03/22  4:01 AM   Specimen: Urine, Catheterized  Result Value Ref Range Status   Specimen Description   Final    URINE, CATHETERIZED Performed at [facility], [address] [place], [place] [address]    Special Requests   Final    NONE Performed at [facility], [address] [place], [place] [address]    Culture (A)  Final    20,000 COLONIES/mL METHICILLIN RESISTANT STAPHYLOCOCCUS AUREUS   Report Status 12/05/2022 FINAL  Final   Organism ID, Bacteria METHICILLIN RESISTANT STAPHYLOCOCCUS AUREUS (A)  Final      Susceptibility   Methicillin resistant staphylococcus aureus - MIC*    CIPROFLOXACIN >=8 RESISTANT Resistant     GENTAMICIN <=0.5 SENSITIVE Sensitive     NITROFURANTOIN 32 SENSITIVE Sensitive     OXACILLIN >=4 RESISTANT Resistant     TETRACYCLINE <=1 SENSITIVE Sensitive     VANCOMYCIN <=0.5 SENSITIVE Sensitive     TRIMETH/SULFA <=10 SENSITIVE Sensitive     CLINDAMYCIN <=0.25 SENSITIVE Sensitive     RIFAMPIN <=0.5 SENSITIVE Sensitive     Inducible Clindamycin NEGATIVE Sensitive     * 20,000 COLONIES/mL METHICILLIN RESISTANT STAPHYLOCOCCUS AUREUS  Blood culture (routine x 2)     Status: Abnormal (Preliminary result)   Collection Time: 12/03/22  4:10 AM   Specimen: BLOOD  Result Value Ref Range Status   Specimen Description BLOOD SITE NOT SPECIFIED  Final   Special Requests   Final    BOTTLES DRAWN AEROBIC AND ANAEROBIC Blood Culture adequate volume   Culture  Setup Time   Final    CRITICAL RESULT CALLED TO, READ BACK BY AND VERIFIED WITH: IN BOTH AEROBIC AND ANAEROBIC BOTTLES GRAM POSITIVE COCCI Gram Stain Report Called to,Read Back By and Verified With: NAKIA MCGOWAN @ 6195 ON 12/05/22 C [place]ARN[place] CRITICAL RESULT CALLED TO, READ BACK BY AND VERIFIED WITH: RN Minna Merritts [facility] ON 12/05/22 @ 1950 BY  DRT Performed at [facility], [address] [place], [place] [address]    Culture STAPHYLOCOCCUS AUREUS (A)  Final   Report Status PENDING  Incomplete  Blood Culture ID Panel (Reflexed)     Status: Abnormal   Collection Time: 12/03/22  4:10 AM  Result Value  Ref Range Status   Enterococcus faecalis NOT DETECTED NOT DETECTED Final   Enterococcus Faecium NOT DETECTED NOT DETECTED Final   Listeria monocytogenes NOT DETECTED NOT DETECTED Final   Staphylococcus species DETECTED (A) NOT DETECTED Final    Comment: CRITICAL RESULT CALLED TO, READ BACK BY AND VERIFIED WITH: RN Minna Merritts [facility] ON 12/05/22 @ 1950 BY DRT    Staphylococcus aureus (BCID) DETECTED (A) NOT DETECTED Final    Comment: Methicillin (oxacillin)-resistant Staphylococcus aureus (MRSA). MRSA is predictably resistant to beta-lactam antibiotics (except ceftaroline). Preferred therapy is vancomycin unless clinically contraindicated. Patient requires contact precautions if  hospitalized. CRITICAL RESULT CALLED TO, READ BACK BY AND VERIFIED WITH: RN Minna Merritts [facility] ON 12/05/22 @ 1950 BY DRT    Staphylococcus epidermidis NOT DETECTED NOT DETECTED Final   Staphylococcus lugdunensis NOT DETECTED NOT DETECTED Final   Streptococcus species NOT DETECTED NOT DETECTED Final   Streptococcus agalactiae NOT DETECTED NOT DETECTED Final   Streptococcus pneumoniae NOT DETECTED NOT DETECTED Final   Streptococcus pyogenes NOT DETECTED NOT DETECTED Final   A.calcoaceticus-baumannii NOT DETECTED NOT DETECTED Final   Bacteroides fragilis NOT DETECTED NOT DETECTED Final   Enterobacterales NOT DETECTED NOT DETECTED Final   Enterobacter cloacae complex NOT DETECTED NOT DETECTED Final   Escherichia coli NOT DETECTED NOT DETECTED Final   Klebsiella aerogenes NOT DETECTED NOT DETECTED Final   Klebsiella oxytoca NOT DETECTED NOT DETECTED Final   Klebsiella pneumoniae NOT DETECTED NOT DETECTED Final   Proteus species NOT DETECTED NOT DETECTED  Final   Salmonella species NOT DETECTED NOT DETECTED Final   Serratia marcescens NOT DETECTED NOT DETECTED Final   Haemophilus influenzae NOT DETECTED NOT DETECTED Final   Neisseria meningitidis NOT DETECTED NOT DETECTED Final   Pseudomonas aeruginosa NOT DETECTED NOT DETECTED Final   Stenotrophomonas maltophilia NOT DETECTED NOT DETECTED Final   Candida albicans NOT DETECTED NOT DETECTED Final   Candida auris NOT DETECTED NOT DETECTED Final   Candida glabrata NOT DETECTED NOT DETECTED Final   Candida krusei NOT DETECTED NOT DETECTED Final   Candida parapsilosis NOT DETECTED NOT DETECTED Final   Candida tropicalis NOT DETECTED NOT DETECTED Final   Cryptococcus neoformans/gattii NOT DETECTED NOT DETECTED Final   Meth resistant mecA/C and MREJ DETECTED (A) NOT DETECTED Final    Comment: CRITICAL RESULT CALLED TO, READ BACK BY AND VERIFIED WITH: RN Minna Merritts [facility] ON 12/05/22 @ 1950 BY DRT Performed at [facility], [address], [place], [place] [address]   Culture, blood (Routine X 2) w Reflex to ID Panel     Status: None (Preliminary result)   Collection Time: 12/05/22  9:12 AM   Specimen: BLOOD  Result Value Ref Range Status   Specimen Description BLOOD LEFT ANTECUBITAL  Final   Special Requests   Final    BOTTLES DRAWN AEROBIC AND ANAEROBIC Blood Culture adequate volume   Culture  Setup Time   Final    GRAM POSITIVE COCCI IN CLUSTERS AEROBIC BOTTLE ONLY CRITICAL RESULT CALLED TO, READ BACK BY AND VERIFIED WITH: [place], AT 1334 12/06/22 D. VANHOOK Performed at [facility], [place] [address] [place], [place] [address]    Culture GRAM POSITIVE COCCI  Final   Report Status PENDING  Incomplete  Culture, blood (Routine X 2) w Reflex to ID Panel     Status: None (Preliminary result)   Collection Time: 12/05/22  9:15 AM   Specimen: BLOOD LEFT WRIST  Result Value Ref Range Status   Specimen  Description BLOOD LEFT WRIST  Final   Special Requests   Final     BOTTLES DRAWN AEROBIC AND ANAEROBIC Blood Culture adequate volume   Culture  Setup Time   Final    GRAM POSITIVE COCCI IN CLUSTERS AEROBIC BOTTLE ONLY CRITICAL VALUE NOTED.  VALUE IS CONSISTENT WITH PREVIOUSLY REPORTED AND CALLED VALUE. Performed at [facility], [place] [address], [place], [place] [address]    Culture GRAM POSITIVE COCCI  Final   Report Status PENDING  Incomplete  Aerobic/Anaerobic Culture w Gram Stain (surgical/deep wound)     Status: None (Preliminary result)   Collection Time: 12/05/22  7:52 PM   Specimen: Abscess  Result Value Ref Range Status   Specimen Description ABSCESS  Final   Special Requests LUMBAR 4 EPIDURAL SPACE  Final   Gram Stain   Final    RARE WBC PRESENT, PREDOMINANTLY PMN NO ORGANISMS SEEN Performed at [facility], [place] [address] [place], [place] [address]    Culture PENDING  Incomplete   Report Status PENDING  Incomplete         Radiology Studies: DG Lumbar Spine 2-3 Views  Result Date: 12/05/2022 CLINICAL DATA:  Lumbar laminectomy EXAM: LUMBAR SPINE - 2-3 VIEW COMPARISON:  MRI from earlier in the same day. FINDINGS: Initial film demonstrates a needle in the posterior soft tissues at the L3-4 interspace. Subsequent image shows retractor and surgical instruments at this level. IMPRESSION: Intraoperative localization for laminectomy. Electronically Signed   By: Inez Catalina M.D.   On: 12/05/2022 21:14   MR LUMBAR SPINE WO CONTRAST  Addendum Date: 12/05/2022   ADDENDUM REPORT: 12/05/2022 14:44 ADDENDUM: These results will be called to the ordering clinician or representative by the Radiologist Assistant, and communication documented in the PACS or [employer]. Electronically Signed   By: Margaretha Sheffield M.D.   On: 12/05/2022 14:44   Result Date: 12/05/2022 CLINICAL DATA:  Osteomyelitis, lumbar lumbar discitis , with surrounding inflammation EXAM: MRI LUMBAR SPINE WITHOUT CONTRAST TECHNIQUE: Multiplanar, multisequence MR  imaging of the lumbar spine was performed. No intravenous contrast was administered. COMPARISON:  None Available. FINDINGS: Segmentation:  Standard. Alignment:  No substantial sagittal subluxation. Vertebrae: Abnormal marrow edema at L3-L4, compatible with osteomyelitis given additional findings. Also, abnormal L3-L4 disc edema. Conus medullaris and cauda equina: Conus extends to the superior L1 level. Conus appears normal. Paraspinal and other soft tissues: Extensive surrounding edema/phlegmon involving the left paravertebral soft tissues at L3-L4. Multiple probable paraspinal abscesses within the psoas musculature bilaterally, measuring up to 1.7 cm on the left and 1.5 cm on the right (for example, see series 8, image 30). The absence of contrast limits assessment. Disc levels: T12-L1: No significant disc protrusion, foraminal stenosis, or canal stenosis. L1-L2: Mild disc bulging. No significant stenosis. Mild disc bulging. L2-L3: Left eccentric disc bulge which result in mild left subarticular recess and left foraminal stenosis. No significant canal or right foraminal stenosis. L3-L4: Abnormal T2 intermediate signal within the ventral canal extending from L3 to L4-L5, measuring up to 9 by 27 mm in transverse dimensions at the superior L4 level and compatible with ventral epidural phlegmon. Absence of contrast limits assessment for peripheral enhancement. L4-L5: Small amount of epidural phlegmon in the ventral canal at this level. Mild disc bulging. Resulting mild canal stenosis. Bilateral facet arthropathy with mild to moderate bilateral foraminal stenosis. L5-S1: Right eccentric disc and endplate spurring with right greater than left facet arthropathy. Resulting severe right and moderate left foraminal stenosis.  Right subarticular recess stenosis with potential involvement of the descending right S1 nerve root. Patent canal. IMPRESSION: 1. Findings compatible with discitis/osteomyelitis at L3-L4 with 9 mm thick  ventral canal phlegmon and/or abscess which extends from L3 to L4-L5 and results in moderate to severe L3-L4 canal stenosis. 2. Multiple probable paraspinal abscesses within the psoas musculature bilaterally, measuring up to 1.7 cm on the left and 1.5 cm on the right. 3. Postcontrast imaging could better assess the above findings if clinically warranted. 4. Superimposed multilevel degenerative change, greatest at L5-S1 where there is severe right and moderate left foraminal stenosis. Also, narrowing of the right subarticular recess at this level. Electronically Signed: By: Margaretha Sheffield M.D. On: 12/05/2022 14:39   ECHOCARDIOGRAM COMPLETE  Result Date: 12/05/2022    ECHOCARDIOGRAM REPORT   Patient Name:   Hunter Reyes Date of Exam: 12/05/2022 Medical Rec #:  [id number]         Height:       74.0 in Accession #:    [id number]        Weight:       328.5 lb Date of Birth:  [birth year]         BSA:          2.684 m Patient Age:    87 years          BP:           118/76 mmHg Patient Gender: M                 HR:           88 bpm. Exam Location:  Inpatient Procedure: 2D Echo, Cardiac Doppler and Color Doppler Indications:    Bacteremia R78.81  History:        Patient has no prior history of Echocardiogram examinations.                 COPD.  Sonographer:    Darlina Sicilian RDCS Referring Phys: [place]  1. Left ventricular ejection fraction, by estimation, is 60 to 65%. The left ventricle has normal function. The left ventricle has no regional wall motion abnormalities. Left ventricular diastolic parameters were normal.  2. Right ventricular systolic function is normal. The right ventricular size is normal. Tricuspid regurgitation signal is inadequate for assessing PA pressure.  3. The interatrial septum appears aneurysmal. Cannot exclude a possible PFO by color doppler.  4. The mitral valve is normal in structure. No evidence of mitral valve regurgitation. No evidence of mitral  stenosis.  5. The aortic valve is tricuspid. Aortic valve regurgitation is not visualized. No aortic stenosis is present.  6. Aortic dilatation noted. There is borderline dilatation of the aortic root, measuring 36 mm.  7. The inferior vena cava is normal in size with greater than 50% respiratory variability, suggesting right atrial pressure of 3 mmHg. Conclusion(s)/Recommendation(s): No evidence of valvular vegetations on this transthoracic echocardiogram. Consider a transesophageal echocardiogram to exclude infective endocarditis if clinically indicated. FINDINGS  Left Ventricle: Left ventricular ejection fraction, by estimation, is 60 to 65%. The left ventricle has normal function. The left ventricle has no regional wall motion abnormalities. The left ventricular internal cavity size was normal in size. There is  no left ventricular hypertrophy. Left ventricular diastolic parameters were normal. Right Ventricle: The right ventricular size is normal. No increase in right ventricular wall thickness. Right ventricular systolic function is normal. Tricuspid regurgitation signal is inadequate for assessing PA pressure. Left  Atrium: Left atrial size was normal in size. Right Atrium: Right atrial size was normal in size. Pericardium: There is no evidence of pericardial effusion. Mitral Valve: The mitral valve is normal in structure. No evidence of mitral valve regurgitation. No evidence of mitral valve stenosis. Tricuspid Valve: The tricuspid valve is grossly normal. Tricuspid valve regurgitation is not demonstrated. No evidence of tricuspid stenosis. Aortic Valve: The aortic valve is tricuspid. Aortic valve regurgitation is not visualized. No aortic stenosis is present. Pulmonic Valve: The pulmonic valve was grossly normal. Pulmonic valve regurgitation is not visualized. No evidence of pulmonic stenosis. Aorta: Aortic dilatation noted and the aortic root is normal in size and structure. There is borderline dilatation  of the aortic root, measuring 36 mm. Venous: The inferior vena cava is normal in size with greater than 50% respiratory variability, suggesting right atrial pressure of 3 mmHg. IAS/Shunts: The interatrial septum is aneurysmal. The atrial septum is grossly normal.  LEFT VENTRICLE PLAX 2D LVIDd:         5.10 cm   Diastology LVIDs:         3.30 cm   LV e' medial:    7.62 cm/s LV PW:         0.80 cm   LV E/e' medial:  7.5 LV IVS:        0.90 cm   LV e' lateral:   16.30 cm/s LVOT diam:     2.50 cm   LV E/e' lateral: 3.5 LV SV:         72 LV SV Index:   27 LVOT Area:     4.91 cm  RIGHT VENTRICLE RV S prime:     18.00 cm/s TAPSE (M-mode): 1.8 cm LEFT ATRIUM             Index        RIGHT ATRIUM           Index LA Vol (A2C):   38.8 ml 14.46 ml/m  RA Area:     14.10 cm LA Vol (A4C):   21.5 ml 8.01 ml/m   RA Volume:   30.30 ml  11.29 ml/m LA Biplane Vol: 29.0 ml 10.81 ml/m  AORTIC VALVE LVOT Vmax:   80.90 cm/s LVOT Vmean:  52.000 cm/s LVOT VTI:    0.146 m  AORTA Ao Root diam: 3.60 cm MITRAL VALVE MV Area (PHT): 3.45 cm    SHUNTS MV Decel Time: 220 msec    Systemic VTI:  0.15 m MV E velocity: 57.40 cm/s  Systemic Diam: 2.50 cm MV A velocity: 74.60 cm/s MV E/A ratio:  0.77 Eleonore Chiquito MD Electronically signed by Eleonore Chiquito MD Signature Date/Time: 12/05/2022/10:33:02 AM    Final         Scheduled Meds:  buprenorphine-naloxone  1 tablet Sublingual BID   Chlorhexidine Gluconate Cloth  6 each Topical Q0600   gabapentin  600 mg Oral BID   LORazepam  1 mg Intravenous Once   sertraline  25 mg Oral Daily   sodium zirconium cyclosilicate  10 g Oral Once   Continuous Infusions:  DAPTOmycin (CUBICIN) 850 mg in sodium chloride 0.9 % IVPB 850 mg (12/05/22 1716)   dextrose 5 % and 0.45% NaCl 75 mL/hr at 12/06/22 0938   sodium bicarbonate 150 mEq in dextrose 5 % 1,150 mL infusion 100 mL/hr at 12/06/22 0931   thiamine (VITAMIN B1) injection 500 mg (12/06/22 0937)     LOS: 3 days  Phillips Climes,  MD [facility]   To contact the attending provider between 7A-7P or the covering provider during after hours 7P-7A, please log into the web site [email or web address] and access using universal [place] password for that web site. If you do not have the password, please call the hospital operator.  12/06/2022, 2:40 PM

## 2022-12-06 NOTE — Progress Notes (Addendum)
Subjective:   "I want some water"   Antibiotics:  Anti-infectives (From admission, onward)    Start     Dose/Rate Route Frequency Ordered Stop   12/05/22 1800  DAPTOmycin (CUBICIN) 850 mg in sodium chloride 0.9 % IVPB        8 mg/kg  108.9 kg (Adjusted) 134 mL/hr over 30 Minutes Intravenous Daily 12/05/22 1654     12/05/22 1200  DAPTOmycin (CUBICIN) 850 mg in sodium chloride 0.9 % IVPB  Status:  Discontinued        8 mg/kg  108.9 kg (Adjusted) 134 mL/hr over 30 Minutes Intravenous Daily 12/05/22 0923 12/05/22 1654   12/05/22 0800  vancomycin (VANCOREADY) IVPB 2000 mg/400 mL  Status:  Discontinued        2,000 mg 200 mL/hr over 120 Minutes Intravenous Every 24 hours 12/05/22 0632 12/05/22 0923   12/04/22 2200  ceFEPIme (MAXIPIME) 2 g in sodium chloride 0.9 % 100 mL IVPB  Status:  Discontinued        2 g 200 mL/hr over 30 Minutes Intravenous Every 12 hours 12/04/22 1242 12/05/22 0237   12/04/22 0600  ceFEPIme (MAXIPIME) 1 g in sodium chloride 0.9 % 100 mL IVPB  Status:  Discontinued        1 g 200 mL/hr over 30 Minutes Intravenous Every 24 hours 12/03/22 0508 12/04/22 1242   12/03/22 1100  ceFEPIme (MAXIPIME) 2 g in sodium chloride 0.9 % 100 mL IVPB        2 g 200 mL/hr over 30 Minutes Intravenous  Once 12/03/22 0418 12/03/22 1120   12/03/22 0508  vancomycin variable dose per unstable renal function (pharmacist dosing)  Status:  Discontinued         Does not apply See admin instructions 12/03/22 0508 12/05/22 0849   12/03/22 0500  metroNIDAZOLE (FLAGYL) IVPB 500 mg  Status:  Discontinued        500 mg 100 mL/hr over 60 Minutes Intravenous Every 12 hours 12/03/22 0418 12/05/22 0237   12/03/22 0430  vancomycin (VANCOCIN) IVPB 1000 mg/200 mL premix  Status:  Discontinued        1,000 mg 200 mL/hr over 60 Minutes Intravenous  Once 12/03/22 0418 12/03/22 0421   12/03/22 0415  vancomycin (VANCOREADY) IVPB 2000 mg/400 mL        2,000 mg 200 mL/hr over 120 Minutes  Intravenous  Once 12/03/22 0404 12/03/22 0630   12/03/22 0200  cefTRIAXone (ROCEPHIN) 2 g in sodium chloride 0.9 % 100 mL IVPB        2 g 200 mL/hr over 30 Minutes Intravenous  Once 12/03/22 0153 12/03/22 0238       Medications: Scheduled Meds:  buprenorphine-naloxone  1 tablet Sublingual BID   Chlorhexidine Gluconate Cloth  6 each Topical Q0600   gabapentin  600 mg Oral BID   LORazepam  1 mg Intravenous Once   sertraline  25 mg Oral Daily   sodium zirconium cyclosilicate  10 g Oral Once   Continuous Infusions:  DAPTOmycin (CUBICIN) 850 mg in sodium chloride 0.9 % IVPB 850 mg (12/05/22 1716)   dextrose 5 % and 0.45% NaCl 75 mL/hr at 12/06/22 0938   sodium bicarbonate 150 mEq in dextrose 5 % 1,150 mL infusion 100 mL/hr at 12/06/22 0931   thiamine (VITAMIN B1) injection 500 mg (12/06/22 0937)   PRN Meds:.acetaminophen **OR** acetaminophen, albuterol, ondansetron **OR** ondansetron (ZOFRAN) IV, tiZANidine, traZODone    Objective: Weight change:   Intake/Output  Summary (Last 24 hours) at 12/06/2022 1115 Last data filed at 12/06/2022 0455 Gross per 24 hour  Intake 1860 ml  Output 4730 ml  Net -2870 ml   Blood pressure 124/64, pulse 96, temperature 99 F (37.2 C), temperature source Oral, resp. rate 11, height 6\' 2"  (1.88 m), weight (!) 149 kg, SpO2 92 %. Temp:  [97.6 F (36.4 C)-99 F (37.2 C)] 99 F (37.2 C) (12/14 0800) Pulse Rate:  [83-101] 96 (12/14 0800) Resp:  [11-17] 11 (12/14 0800) BP: (80-141)/(45-86) 124/64 (12/14 0800) SpO2:  [90 %-97 %] 92 % (12/14 0600)  Physical Exam: Physical Exam Constitutional:      Appearance: He is well-developed.  HENT:     Head: Normocephalic and atraumatic.     Mouth/Throat:     Dentition: Abnormal dentition. Dental caries present.  Eyes:     Conjunctiva/sclera: Conjunctivae normal.  Cardiovascular:     Rate and Rhythm: Normal rate and regular rhythm.  Pulmonary:     Effort: Pulmonary effort is normal. No respiratory  distress.     Breath sounds: No wheezing.  Abdominal:     General: There is no distension.     Palpations: Abdomen is soft.  Musculoskeletal:        General: Normal range of motion.     Cervical back: Normal range of motion and neck supple.  Skin:    General: Skin is warm and dry.     Findings: No erythema or rash.  Neurological:     Mental Status: He is alert and oriented to person, place, and time.  Psychiatric:        Mood and Affect: Mood is anxious.        Behavior: Behavior normal.        Cognition and Memory: Cognition is impaired. Memory is impaired. He exhibits impaired recent memory and impaired remote memory.     Comments: Strength in lower extremity vs gravity seems possibly better      CBC:    BMET Recent Labs    12/05/22 0524 12/06/22 0451  NA 149* 148*  K 5.3* 5.4*  CL 125* 124*  CO2 15* 15*  GLUCOSE 110* 125*  BUN 89* 61*  CREATININE 1.88* 1.48*  CALCIUM 8.8* 8.3*     Liver Panel  Recent Labs    12/05/22 0524 12/06/22 0451  ALBUMIN 2.3* 2.2*       Sedimentation Rate Recent Labs    12/05/22 1131  ESRSEDRATE 83*   C-Reactive Protein Recent Labs    12/05/22 1131  CRP 9.7*    Micro Results: Recent Results (from the past 720 hour(s))  Urine Culture     Status: Abnormal   Collection Time: 11/25/22  5:45 AM   Specimen: Urine, Clean Catch  Result Value Ref Range Status   Specimen Description   Final    URINE, CLEAN CATCH Performed at [facility], [address], [place], [place] [address]    Special Requests   Final    NONE Performed at [facility], [address], [place], [place] [address]    Culture (A)  Final    >=100,000 COLONIES/mL METHICILLIN RESISTANT STAPHYLOCOCCUS AUREUS >=100,000 COLONIES/mL STAPHYLOCOCCUS EPIDERMIDIS    Report Status 11/29/2022 FINAL  Final   Organism ID, Bacteria METHICILLIN RESISTANT STAPHYLOCOCCUS AUREUS (A)  Final   Organism ID, Bacteria STAPHYLOCOCCUS EPIDERMIDIS (A)  Final       Susceptibility   Methicillin resistant staphylococcus aureus - MIC*    CIPROFLOXACIN >=8 RESISTANT Resistant  GENTAMICIN <=0.5 SENSITIVE Sensitive     NITROFURANTOIN <=16 SENSITIVE Sensitive     OXACILLIN >=4 RESISTANT Resistant     TETRACYCLINE <=1 SENSITIVE Sensitive     VANCOMYCIN 1 SENSITIVE Sensitive     TRIMETH/SULFA <=10 SENSITIVE Sensitive     CLINDAMYCIN <=0.25 SENSITIVE Sensitive     RIFAMPIN <=0.5 SENSITIVE Sensitive     Inducible Clindamycin NEGATIVE Sensitive     * >=100,000 COLONIES/mL METHICILLIN RESISTANT STAPHYLOCOCCUS AUREUS   Staphylococcus epidermidis - MIC*    CIPROFLOXACIN >=8 RESISTANT Resistant     GENTAMICIN <=0.5 SENSITIVE Sensitive     NITROFURANTOIN <=16 SENSITIVE Sensitive     OXACILLIN >=4 RESISTANT Resistant     TETRACYCLINE <=1 SENSITIVE Sensitive     VANCOMYCIN <=0.5 SENSITIVE Sensitive     TRIMETH/SULFA <=10 SENSITIVE Sensitive     CLINDAMYCIN <=0.25 SENSITIVE Sensitive     RIFAMPIN <=0.5 SENSITIVE Sensitive     Inducible Clindamycin NEGATIVE Sensitive     * >=100,000 COLONIES/mL STAPHYLOCOCCUS EPIDERMIDIS  Resp Panel by RT-PCR (Flu A&B, Covid) Anterior Nasal Swab     Status: None   Collection Time: 12/02/22 11:30 PM   Specimen: Anterior Nasal Swab  Result Value Ref Range Status   SARS Coronavirus 2 by RT PCR NEGATIVE NEGATIVE Final    Comment: (NOTE) SARS-CoV-2 target nucleic acids are NOT DETECTED.  The SARS-CoV-2 RNA is generally detectable in upper respiratory specimens during the acute phase of infection. The lowest concentration of SARS-CoV-2 viral copies this assay can detect is 138 copies/mL. A negative result does not preclude SARS-Cov-2 infection and should not be used as the sole basis for treatment or other patient management decisions. A negative result may occur with  improper specimen collection/handling, submission of specimen other than nasopharyngeal swab, presence of viral mutation(s) within the areas targeted by this  assay, and inadequate number of viral copies(<138 copies/mL). A negative result must be combined with clinical observations, patient history, and epidemiological information. The expected result is Negative.  Fact Sheet for Patients:  EntrepreneurPulse.com.au  Fact Sheet for Healthcare Providers:  IncredibleEmployment.be  This test is no t yet approved or cleared by the [place] FDA and  has been authorized for detection and/or diagnosis of SARS-CoV-2 by FDA under an Emergency Use Authorization (EUA). This EUA will remain  in effect (meaning this test can be used) for the duration of the COVID-19 declaration under Section 564(b)(1) of the Act, 21 U.S.C.section 360bbb-3(b)(1), unless the authorization is terminated  or revoked sooner.       Influenza A by PCR NEGATIVE NEGATIVE Final   Influenza B by PCR NEGATIVE NEGATIVE Final    Comment: (NOTE) The Xpert Xpress SARS-CoV-2/FLU/RSV plus assay is intended as an aid in the diagnosis of influenza from Nasopharyngeal swab specimens and should not be used as a sole basis for treatment. Nasal washings and aspirates are unacceptable for Xpert Xpress SARS-CoV-2/FLU/RSV testing.  Fact Sheet for Patients: EntrepreneurPulse.com.au  Fact Sheet for Healthcare Providers: IncredibleEmployment.be  This test is not yet approved or cleared by the [place] FDA and has been authorized for detection and/or diagnosis of SARS-CoV-2 by FDA under an Emergency Use Authorization (EUA). This EUA will remain in effect (meaning this test can be used) for the duration of the COVID-19 declaration under Section 564(b)(1) of the Act, 21 U.S.C. section 360bbb-3(b)(1), unless the authorization is terminated or revoked.  Performed at [facility], [address] [place], [place] [address]   Blood culture (routine x 2)  Status: Abnormal (Preliminary result)   Collection Time:  12/03/22  4:00 AM   Specimen: Site Not Specified; Blood  Result Value Ref Range Status   Specimen Description   Final    SITE NOT SPECIFIED BOTTLES DRAWN AEROBIC AND ANAEROBIC Performed at [facility], [address], [place], [place] [address]    Special Requests   Final    Blood Culture adequate volume Performed at [facility] [place], [place] [address]    Culture  Setup Time   Final    GRAM POSITIVE COCCI AEB BOTTLES DRAWN AEROBIC ONLY Gram Stain Report Called to,Read Back By and Verified With: REECE THOMPSON,RN @2143  ON 12/04/22 BY SSLANE CRITICAL RESULT CALLED TO, READ BACK BY AND VERIFIED WITH: G ABBOTT,PHARMD@0200  12/05/22 [place]    Culture (A)  Final    STAPHYLOCOCCUS AUREUS SUSCEPTIBILITIES TO FOLLOW Performed at [facility] [address] [place],  [address]    Report Status PENDING  Incomplete  Blood Culture ID Panel (Reflexed)     Status: Abnormal   Collection Time: 12/03/22  4:00 AM  Result Value Ref Range Status   Enterococcus faecalis NOT DETECTED NOT DETECTED Final   Enterococcus Faecium NOT DETECTED NOT DETECTED Final   Listeria monocytogenes NOT DETECTED NOT DETECTED Final   Staphylococcus species DETECTED (A) NOT DETECTED Final    Comment: CRITICAL RESULT CALLED TO, READ BACK BY AND VERIFIED WITH: G ABBOTT,PHARMD@0200  12/05/22 [place]    Staphylococcus aureus (BCID) DETECTED (A) NOT DETECTED Final    Comment: Methicillin (oxacillin)-resistant Staphylococcus aureus (MRSA). MRSA is predictably resistant to beta-lactam antibiotics (except ceftaroline). Preferred therapy is vancomycin unless clinically contraindicated. Patient requires contact precautions if  hospitalized. CRITICAL RESULT CALLED TO, READ BACK BY AND VERIFIED WITH: G ABBOTT,PHARMD@0200  12/05/22 [place]    Staphylococcus epidermidis NOT DETECTED NOT DETECTED Final   Staphylococcus lugdunensis NOT DETECTED NOT DETECTED Final   Streptococcus species NOT DETECTED NOT DETECTED Final    Streptococcus agalactiae NOT DETECTED NOT DETECTED Final   Streptococcus pneumoniae NOT DETECTED NOT DETECTED Final   Streptococcus pyogenes NOT DETECTED NOT DETECTED Final   A.calcoaceticus-baumannii NOT DETECTED NOT DETECTED Final   Bacteroides fragilis NOT DETECTED NOT DETECTED Final   Enterobacterales NOT DETECTED NOT DETECTED Final   Enterobacter cloacae complex NOT DETECTED NOT DETECTED Final   Escherichia coli NOT DETECTED NOT DETECTED Final   Klebsiella aerogenes NOT DETECTED NOT DETECTED Final   Klebsiella oxytoca NOT DETECTED NOT DETECTED Final   Klebsiella pneumoniae NOT DETECTED NOT DETECTED Final   Proteus species NOT DETECTED NOT DETECTED Final   Salmonella species NOT DETECTED NOT DETECTED Final   Serratia marcescens NOT DETECTED NOT DETECTED Final   Haemophilus influenzae NOT DETECTED NOT DETECTED Final   Neisseria meningitidis NOT DETECTED NOT DETECTED Final   Pseudomonas aeruginosa NOT DETECTED NOT DETECTED Final   Stenotrophomonas maltophilia NOT DETECTED NOT DETECTED Final   Candida albicans NOT DETECTED NOT DETECTED Final   Candida auris NOT DETECTED NOT DETECTED Final   Candida glabrata NOT DETECTED NOT DETECTED Final   Candida krusei NOT DETECTED NOT DETECTED Final   Candida parapsilosis NOT DETECTED NOT DETECTED Final   Candida tropicalis NOT DETECTED NOT DETECTED Final   Cryptococcus neoformans/gattii NOT DETECTED NOT DETECTED Final   Meth resistant mecA/C and MREJ DETECTED (A) NOT DETECTED Final    Comment: CRITICAL RESULT CALLED TO, READ BACK BY AND VERIFIED WITH: G ABBOTT,PHARMD@0200  12/05/22 [place] Performed at [facility], [address] [place],  [place] [address]   Urine Culture     Status: Abnormal   Collection Time: 12/03/22  4:01 AM   Specimen: Urine, Catheterized  Result Value Ref Range Status   Specimen Description   Final    URINE, CATHETERIZED Performed at [facility], [address], [place], [place] [address]    Special Requests    Final    NONE Performed at [facility], [address], [place], [place] [address]    Culture (A)  Final    20,000 COLONIES/mL METHICILLIN RESISTANT STAPHYLOCOCCUS AUREUS   Report Status 12/05/2022 FINAL  Final   Organism ID, Bacteria METHICILLIN RESISTANT STAPHYLOCOCCUS AUREUS (A)  Final      Susceptibility   Methicillin resistant staphylococcus aureus - MIC*    CIPROFLOXACIN >=8 RESISTANT Resistant     GENTAMICIN <=0.5 SENSITIVE Sensitive     NITROFURANTOIN 32 SENSITIVE Sensitive     OXACILLIN >=4 RESISTANT Resistant     TETRACYCLINE <=1 SENSITIVE Sensitive     VANCOMYCIN <=0.5 SENSITIVE Sensitive     TRIMETH/SULFA <=10 SENSITIVE Sensitive     CLINDAMYCIN <=0.25 SENSITIVE Sensitive     RIFAMPIN <=0.5 SENSITIVE Sensitive     Inducible Clindamycin NEGATIVE Sensitive     * 20,000 COLONIES/mL METHICILLIN RESISTANT STAPHYLOCOCCUS AUREUS  Blood culture (routine x 2)     Status: Abnormal (Preliminary result)   Collection Time: 12/03/22  4:10 AM   Specimen: BLOOD  Result Value Ref Range Status   Specimen Description BLOOD SITE NOT SPECIFIED  Final   Special Requests   Final    BOTTLES DRAWN AEROBIC AND ANAEROBIC Blood Culture adequate volume   Culture  Setup Time   Final    CRITICAL RESULT CALLED TO, READ BACK BY AND VERIFIED WITH: IN BOTH AEROBIC AND ANAEROBIC BOTTLES GRAM POSITIVE COCCI Gram Stain Report Called to,Read Back By and Verified With: NAKIA MCGOWAN @ 3267 ON 12/05/22 [place] CRITICAL RESULT CALLED TO, READ BACK BY AND VERIFIED WITH: RN Minna Merritts [facility] ON 12/05/22 @ 1950 BY DRT Performed at [facility], [address] [place],  [address]    Culture STAPHYLOCOCCUS AUREUS (A)  Final   Report Status PENDING  Incomplete  Blood Culture ID Panel (Reflexed)     Status: Abnormal   Collection Time: 12/03/22  4:10 AM  Result Value Ref Range Status   Enterococcus faecalis NOT DETECTED NOT DETECTED Final   Enterococcus Faecium NOT DETECTED NOT DETECTED Final    Listeria monocytogenes NOT DETECTED NOT DETECTED Final   Staphylococcus species DETECTED (A) NOT DETECTED Final    Comment: CRITICAL RESULT CALLED TO, READ BACK BY AND VERIFIED WITH: RN Minna Merritts [facility] ON 12/05/22 @ 1950 BY DRT    Staphylococcus aureus (BCID) DETECTED (A) NOT DETECTED Final    Comment: Methicillin (oxacillin)-resistant Staphylococcus aureus (MRSA). MRSA is predictably resistant to beta-lactam antibiotics (except ceftaroline). Preferred therapy is vancomycin unless clinically contraindicated. Patient requires contact precautions if  hospitalized. CRITICAL RESULT CALLED TO, READ BACK BY AND VERIFIED WITH: RN Minna Merritts [facility] ON 12/05/22 @ 1950 BY DRT    Staphylococcus epidermidis NOT DETECTED NOT DETECTED Final   Staphylococcus lugdunensis NOT DETECTED NOT DETECTED Final   Streptococcus species NOT DETECTED NOT DETECTED Final   Streptococcus agalactiae NOT DETECTED NOT DETECTED Final   Streptococcus pneumoniae NOT DETECTED NOT DETECTED Final   Streptococcus pyogenes NOT DETECTED NOT DETECTED Final   A.calcoaceticus-baumannii NOT DETECTED NOT DETECTED Final   Bacteroides fragilis NOT DETECTED NOT DETECTED Final   Enterobacterales NOT  DETECTED NOT DETECTED Final   Enterobacter cloacae complex NOT DETECTED NOT DETECTED Final   Escherichia coli NOT DETECTED NOT DETECTED Final   Klebsiella aerogenes NOT DETECTED NOT DETECTED Final   Klebsiella oxytoca NOT DETECTED NOT DETECTED Final   Klebsiella pneumoniae NOT DETECTED NOT DETECTED Final   Proteus species NOT DETECTED NOT DETECTED Final   Salmonella species NOT DETECTED NOT DETECTED Final   Serratia marcescens NOT DETECTED NOT DETECTED Final   Haemophilus influenzae NOT DETECTED NOT DETECTED Final   Neisseria meningitidis NOT DETECTED NOT DETECTED Final   Pseudomonas aeruginosa NOT DETECTED NOT DETECTED Final   Stenotrophomonas maltophilia NOT DETECTED NOT DETECTED Final   Candida albicans NOT DETECTED NOT DETECTED Final    Candida auris NOT DETECTED NOT DETECTED Final   Candida glabrata NOT DETECTED NOT DETECTED Final   Candida krusei NOT DETECTED NOT DETECTED Final   Candida parapsilosis NOT DETECTED NOT DETECTED Final   Candida tropicalis NOT DETECTED NOT DETECTED Final   Cryptococcus neoformans/gattii NOT DETECTED NOT DETECTED Final   Meth resistant mecA/C and MREJ DETECTED (A) NOT DETECTED Final    Comment: CRITICAL RESULT CALLED TO, READ BACK BY AND VERIFIED WITH: RN Demetrio Lapping ON 12/05/22 @ 1950 BY DRT Performed at [facility], [address], [place], [place] [address]   Culture, blood (Routine X 2) w Reflex to ID Panel     Status: None (Preliminary result)   Collection Time: 12/05/22  9:12 AM   Specimen: BLOOD  Result Value Ref Range Status   Specimen Description BLOOD LEFT ANTECUBITAL  Final   Special Requests   Final    BOTTLES DRAWN AEROBIC AND ANAEROBIC Blood Culture adequate volume   Culture   Final    NO GROWTH < 12 HOURS Performed at [facility], [place] [address], [place], [place] [address]    Report Status PENDING  Incomplete  Culture, blood (Routine X 2) w Reflex to ID Panel     Status: None (Preliminary result)   Collection Time: 12/05/22  9:15 AM   Specimen: BLOOD LEFT WRIST  Result Value Ref Range Status   Specimen Description BLOOD LEFT WRIST  Final   Special Requests   Final    BOTTLES DRAWN AEROBIC AND ANAEROBIC Blood Culture adequate volume   Culture   Final    NO GROWTH < 12 HOURS Performed at [facility], [place] [address] [place], [place] [address]    Report Status PENDING  Incomplete  Aerobic/Anaerobic Culture w Gram Stain (surgical/deep wound)     Status: None (Preliminary result)   Collection Time: 12/05/22  7:52 PM   Specimen: Abscess  Result Value Ref Range Status   Specimen Description ABSCESS  Final   Special Requests LUMBAR 4 EPIDURAL SPACE  Final   Gram Stain   Final    RARE WBC PRESENT, PREDOMINANTLY PMN NO ORGANISMS SEEN Performed  at [facility], [place] [address], [place],  [address]    Culture PENDING  Incomplete   Report Status PENDING  Incomplete    Studies/Results: DG Lumbar Spine 2-3 Views  Result Date: 12/05/2022 CLINICAL DATA:  Lumbar laminectomy EXAM: LUMBAR SPINE - 2-3 VIEW COMPARISON:  MRI from earlier in the same day. FINDINGS: Initial film demonstrates a needle in the posterior soft tissues at the L3-4 interspace. Subsequent image shows retractor and surgical instruments at this level. IMPRESSION: Intraoperative localization for laminectomy. Electronically Signed   By: Inez Catalina M.D.   On:  12/05/2022 21:14   MR LUMBAR SPINE WO CONTRAST  Addendum Date: 12/05/2022   ADDENDUM REPORT: 12/05/2022 14:44 ADDENDUM: These results will be called to the ordering clinician or representative by the Radiologist Assistant, and communication documented in the PACS or [employer]. Electronically Signed   By: Margaretha Sheffield M.D.   On: 12/05/2022 14:44   Result Date: 12/05/2022 CLINICAL DATA:  Osteomyelitis, lumbar lumbar discitis , with surrounding inflammation EXAM: MRI LUMBAR SPINE WITHOUT CONTRAST TECHNIQUE: Multiplanar, multisequence MR imaging of the lumbar spine was performed. No intravenous contrast was administered. COMPARISON:  None Available. FINDINGS: Segmentation:  Standard. Alignment:  No substantial sagittal subluxation. Vertebrae: Abnormal marrow edema at L3-L4, compatible with osteomyelitis given additional findings. Also, abnormal L3-L4 disc edema. Conus medullaris and cauda equina: Conus extends to the superior L1 level. Conus appears normal. Paraspinal and other soft tissues: Extensive surrounding edema/phlegmon involving the left paravertebral soft tissues at L3-L4. Multiple probable paraspinal abscesses within the psoas musculature bilaterally, measuring up to 1.7 cm on the left and 1.5 cm on the right (for example, see series 8, image 30). The absence of contrast limits assessment. Disc  levels: T12-L1: No significant disc protrusion, foraminal stenosis, or canal stenosis. L1-L2: Mild disc bulging. No significant stenosis. Mild disc bulging. L2-L3: Left eccentric disc bulge which result in mild left subarticular recess and left foraminal stenosis. No significant canal or right foraminal stenosis. L3-L4: Abnormal T2 intermediate signal within the ventral canal extending from L3 to L4-L5, measuring up to 9 by 27 mm in transverse dimensions at the superior L4 level and compatible with ventral epidural phlegmon. Absence of contrast limits assessment for peripheral enhancement. L4-L5: Small amount of epidural phlegmon in the ventral canal at this level. Mild disc bulging. Resulting mild canal stenosis. Bilateral facet arthropathy with mild to moderate bilateral foraminal stenosis. L5-S1: Right eccentric disc and endplate spurring with right greater than left facet arthropathy. Resulting severe right and moderate left foraminal stenosis. Right subarticular recess stenosis with potential involvement of the descending right S1 nerve root. Patent canal. IMPRESSION: 1. 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 2. Multiple probable paraspinal abscesses within the psoas musculature bilaterally, measuring up to 1.7 cm on the left and 1.5 cm on the right. 3. Postcontrast imaging could better assess the above findings if clinically warranted. 4. Superimposed multilevel degenerative change, greatest at L5-S1 where there is severe right and moderate left foraminal stenosis. Also, narrowing of the right subarticular recess at this level. Electronically Signed: By: Margaretha Sheffield M.D. On: 12/05/2022 14:39   ECHOCARDIOGRAM COMPLETE  Result Date: 12/05/2022    ECHOCARDIOGRAM REPORT   Patient Name:   Hunter Reyes Date of Exam: 12/05/2022 Medical Rec #:   [id number]         Height:       74.0 in Accession #:    [id number]        Weight:       328.5 lb Date of Birth:  04-Apr-1976         BSA:          2.684 m Patient Age:    63 years          BP:           118/76 mmHg Patient Gender: M  HR:           88 bpm. Exam Location:  Inpatient Procedure: 2D Echo, Cardiac Doppler and Color Doppler Indications:    Bacteremia R78.81  History:        Patient has no prior history of Echocardiogram examinations.                 COPD.  Sonographer:    Darlina Sicilian RDCS Referring Phys: [place]  1. Left ventricular ejection fraction, by estimation, is 60 to 65%. The left ventricle has normal function. The left ventricle has no regional wall motion abnormalities. Left ventricular diastolic parameters were normal.  2. Right ventricular systolic function is normal. The right ventricular size is normal. Tricuspid regurgitation signal is inadequate for assessing PA pressure.  3. The interatrial septum appears aneurysmal. Cannot exclude a possible PFO by color doppler.  4. The mitral valve is normal in structure. No evidence of mitral valve regurgitation. No evidence of mitral stenosis.  5. The aortic valve is tricuspid. Aortic valve regurgitation is not visualized. No aortic stenosis is present.  6. Aortic dilatation noted. There is borderline dilatation of the aortic root, measuring 36 mm.  7. The inferior vena cava is normal in size with greater than 50% respiratory variability, suggesting right atrial pressure of 3 mmHg. Conclusion(s)/Recommendation(s): No evidence of valvular vegetations on this transthoracic echocardiogram. Consider a transesophageal echocardiogram to exclude infective endocarditis if clinically indicated. FINDINGS  Left Ventricle: Left ventricular ejection fraction, by estimation, is 60 to 65%. The left ventricle has normal function. The left ventricle has no regional wall motion abnormalities. The left ventricular internal  cavity size was normal in size. There is  no left ventricular hypertrophy. Left ventricular diastolic parameters were normal. Right Ventricle: The right ventricular size is normal. No increase in right ventricular wall thickness. Right ventricular systolic function is normal. Tricuspid regurgitation signal is inadequate for assessing PA pressure. Left Atrium: Left atrial size was normal in size. Right Atrium: Right atrial size was normal in size. Pericardium: There is no evidence of pericardial effusion. Mitral Valve: The mitral valve is normal in structure. No evidence of mitral valve regurgitation. No evidence of mitral valve stenosis. Tricuspid Valve: The tricuspid valve is grossly normal. Tricuspid valve regurgitation is not demonstrated. No evidence of tricuspid stenosis. Aortic Valve: The aortic valve is tricuspid. Aortic valve regurgitation is not visualized. No aortic stenosis is present. Pulmonic Valve: The pulmonic valve was grossly normal. Pulmonic valve regurgitation is not visualized. No evidence of pulmonic stenosis. Aorta: Aortic dilatation noted and the aortic root is normal in size and structure. There is borderline dilatation of the aortic root, measuring 36 mm. Venous: The inferior vena cava is normal in size with greater than 50% respiratory variability, suggesting right atrial pressure of 3 mmHg. IAS/Shunts: The interatrial septum is aneurysmal. The atrial septum is grossly normal.  LEFT VENTRICLE PLAX 2D LVIDd:         5.10 cm   Diastology LVIDs:         3.30 cm   LV e' medial:    7.62 cm/s LV PW:         0.80 cm   LV E/e' medial:  7.5 LV IVS:        0.90 cm   LV e' lateral:   16.30 cm/s LVOT diam:     2.50 cm   LV E/e' lateral: 3.5 LV SV:         72 LV  SV Index:   27 LVOT Area:     4.91 cm  RIGHT VENTRICLE RV S prime:     18.00 cm/s TAPSE (M-mode): 1.8 cm LEFT ATRIUM             Index        RIGHT ATRIUM           Index LA Vol (A2C):   38.8 ml 14.46 ml/m  RA Area:     14.10 cm LA Vol (A4C):    21.5 ml 8.01 ml/m   RA Volume:   30.30 ml  11.29 ml/m LA Biplane Vol: 29.0 ml 10.81 ml/m  AORTIC VALVE LVOT Vmax:   80.90 cm/s LVOT Vmean:  52.000 cm/s LVOT VTI:    0.146 m  AORTA Ao Root diam: 3.60 cm MITRAL VALVE MV Area (PHT): 3.45 cm    SHUNTS MV Decel Time: 220 msec    Systemic VTI:  0.15 m MV E velocity: 57.40 cm/s  Systemic Diam: 2.50 cm MV A velocity: 74.60 cm/s MV E/A ratio:  0.77 Eleonore Chiquito MD Electronically signed by Eleonore Chiquito MD Signature Date/Time: 12/05/2022/10:33:02 AM    Final       Assessment/Plan:  INTERVAL HISTORY:   Patient is sp Neurosurgery   Principal Problem:   MRSA bacteremia Active Problems:   AKI (acute kidney injury) ([place])   Diskitis   Bipolar 1 disorder ([place])   Peripheral neuropathy   Polysubstance abuse ([place])   Metabolic acidosis   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 He is also bacteremic with MRSA which is culprit organism for this "metastatic" infection  He is now sp Right L4 laminotomy, sublaminar decompression with debridement of ventral epidural abscess  by Dr. Kathyrn Sheriff   #1 Metastatic MRSA infection with bacteremia, diskitis, osteo epidural abscess  Concern that feet where he had osteo before could be source  I will order MRI of each of these  He denies IVDU but he is not very reliable since he also denied cocaine use  Repeat blood cultures have been taken   Continue daptomycin  Will follow these up along with his operative cultures  At some point we may pursue TEE but there is no rush and if he has endocarditis this would not effect length of therapy  I will plan on him getting 6-8  weeks of IV antibiotics postoperatively , provided the repeat cultures from blood taken same day are no growth  I would imagine this will have to be done in the hospital and then SNF  2 Chronic hepatitis B without hepatic coma  Hep E ag negative, awaiting further labs  I spent 54 minutes with the patient including than 50% of the time in face to face counseling of the patient re his metastatic MRSA infection personally reviewing MRI L spine, along with review of medical records in preparation for the visit and during the visit and in coordination of his care.    LOS: 3 days   Alcide Evener 12/06/2022, 11:15 AM

## 2022-12-06 NOTE — Evaluation (Signed)
Physical Therapy Evaluation Patient Details Name: Hunter Reyes MRN: [id number] DOB: [birth year] Today's Date: 12/06/2022  History of Present Illness  DEKE TILGHMAN is a 46 y.o. Caucasian male admitted 12/10 who presented to the emergency room with acute onset of altered mental status with associated urinary retention and low back pain. Underwent L4 lumbar laminectomy for epidural abscess 12/13. PMH:  bipolar 1 disorder, borderline personality disorder, COPD, crack cocaine abuse, depression, peripheral neuropathy, history of right foot osteomyelitis, panic attack and history of seizures  Clinical Impression  Pt admitted with above diagnosis. Pt was able to sit EOB with min guard assist but fatigued quickly and did not want to stand today due to pain. Pt should progress well and be able to go home with HHPT and equipment as belwo.  Pt currently with functional limitations due to the deficits listed below (see PT Problem List). Pt will benefit from skilled PT to increase their independence and safety with mobility to allow discharge to the venue listed below.          Recommendations for follow up therapy are one component of a multi-disciplinary discharge planning process, led by the attending physician.  Recommendations may be updated based on patient status, additional functional criteria and insurance authorization.  Follow Up Recommendations Home health PT      Assistance Recommended at Discharge Set up Supervision/Assistance  Patient can return home with the following  Assistance with cooking/housework;Assist for transportation;Help with stairs or ramp for entrance    Equipment Recommendations Rolling walker (2 wheels);BSC/3in1  Recommendations for Other Services       Functional Status Assessment Patient has had a recent decline in their functional status and demonstrates the ability to make significant improvements in function in a reasonable and predictable amount of time.      Precautions / Restrictions Precautions Precautions: Fall Restrictions Weight Bearing Restrictions: No      Mobility  Bed Mobility Overal bed mobility: Needs Assistance Bed Mobility: Rolling, Sidelying to Sit, Sit to Sidelying Rolling: Min assist Sidelying to sit: Mod assist     Sit to sidelying: Min guard General bed mobility comments: Pt needed cues for log roll and some assist as well to come to eOB.  No assist to get back into bed.    Transfers                   General transfer comment: Pt declined to stand    Ambulation/Gait                  Stairs            Wheelchair Mobility    Modified Rankin (Stroke Patients Only)       Balance Overall balance assessment: Needs assistance Sitting-balance support: No upper extremity supported, Feet supported, Bilateral upper extremity supported Sitting balance-[name] Scale: Fair Sitting balance - Comments: can sit without UE support but could not withstand chalelnges. Fatigued quickly and wanted to lie back down.                                     Pertinent Vitals/Pain Pain Assessment Pain Assessment: Faces Faces Pain Scale: Hurts whole lot Pain Location: back Pain Descriptors / Indicators: Aching Pain Intervention(s): Limited activity within patient's tolerance, Monitored during session, Repositioned    Home Living Family/patient expects to be discharged to:: Private residence Living Arrangements: Alone   Type of  Home: House Home Access: Stairs to enter Entrance Stairs-Rails: Psychiatric nurse of Steps: 2   Home Layout: One level Home Equipment: Wheelchair - manual      Prior Function Prior Level of Function : Independent/Modified Independent                     Hand Dominance        Extremity/Trunk Assessment   Upper Extremity Assessment Upper Extremity Assessment: Defer to OT evaluation    Lower Extremity Assessment Lower  Extremity Assessment: Generalized weakness    Cervical / Trunk Assessment Cervical / Trunk Assessment: Normal  Communication   Communication: Other (comment) (slurreed speech)  Cognition Arousal/Alertness: Awake/alert Behavior During Therapy: WFL for tasks assessed/performed Overall Cognitive Status: Within Functional Limits for tasks assessed                                          General Comments General comments (skin integrity, edema, etc.): 98 bpm, 124/64    Exercises General Exercises - Lower Extremity Ankle Circles/Pumps: AROM, Both, 10 reps, Supine Long Arc Quad: AROM, Both, 10 reps, Seated   Assessment/Plan    PT Assessment Patient needs continued PT services  PT Problem List Decreased activity tolerance;Decreased balance;Decreased mobility;Decreased safety awareness;Decreased knowledge of precautions;Decreased knowledge of use of DME       PT Treatment Interventions DME instruction;Gait training;Functional mobility training;Therapeutic activities;Therapeutic exercise;Stair training;Balance training;Patient/family education    PT Goals (Current goals can be found in the Care Plan section)  Acute Rehab PT Goals Patient Stated Goal: to go home PT Goal Formulation: With patient Time For Goal Achievement: 12/20/22 Potential to Achieve Goals: Good    Frequency Min 5X/week     Co-evaluation               AM-PAC PT "6 Clicks" Mobility  Outcome Measure Help needed turning from your back to your side while in a flat bed without using bedrails?: A Little Help needed moving from lying on your back to sitting on the side of a flat bed without using bedrails?: A Lot Help needed moving to and from a bed to a chair (including a wheelchair)?: A Little Help needed standing up from a chair using your arms (e.g., wheelchair or bedside chair)?: A Lot Help needed to walk in hospital room?: A Little Help needed climbing 3-5 steps with a railing? :  Total 6 Click Score: 14    End of Session Equipment Utilized During Treatment: Gait belt Activity Tolerance: Patient limited by fatigue Patient left: with call bell/phone within reach;in bed Nurse Communication: Mobility status PT Visit Diagnosis: Muscle weakness (generalized) (M62.81)    Time: 3244-0102 PT Time Calculation (min) (ACUTE ONLY): 24 min   Charges:   PT Evaluation $PT Eval Moderate Complexity: 1 Mod PT Treatments $Therapeutic Activity: 8-22 mins        [facility] M,PT Acute Rehab Services [phone]   Alvira Philips 12/06/2022, 1:52 PM

## 2022-12-06 NOTE — Progress Notes (Signed)
  NEUROSURGERY PROGRESS NOTE   No issues overnight. Pt reports continued back pain but somewhat improved movement BLE.  EXAM:  BP 122/75 (BP Location: Left Arm)   Pulse 99   Temp (!) 97.5 F (36.4 C) (Axillary)   Resp 12   Ht 6\' 2"  (1.88 m)   Wt (!) 149 kg   SpO2 94%   BMI 42.18 kg/m   Awake, alert, oriented  Speech fluent, appropriate  CN grossly intact  5/5 BUE Good strength proximal and distal BLE Wound c/d/i  IMPRESSION:  46 y.o. male POD#1 L4 laminectomy for decompression/debridement of SEA  PLAN: - Cont abx per primary team - Can f/u in outpatient NS clinic in 6 weeks   Consuella Lose, MD [facility]

## 2022-12-07 ENCOUNTER — Inpatient Hospital Stay (HOSPITAL_COMMUNITY): Payer: Medicaid Other

## 2022-12-07 DIAGNOSIS — B181 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: Secondary | ICD-10-CM | POA: Diagnosis not present

## 2022-12-07 DIAGNOSIS — M4647 Discitis, unspecified, lumbosacral region: Secondary | ICD-10-CM | POA: Diagnosis not present

## 2022-12-07 DIAGNOSIS — B9562 Methicillin resistant Staphylococcus aureus infection as the cause of diseases classified elsewhere: Secondary | ICD-10-CM | POA: Diagnosis not present

## 2022-12-07 DIAGNOSIS — N179 Acute kidney failure, unspecified: Secondary | ICD-10-CM | POA: Diagnosis not present

## 2022-12-07 DIAGNOSIS — F319 Bipolar disorder, unspecified: Secondary | ICD-10-CM | POA: Diagnosis not present

## 2022-12-07 DIAGNOSIS — R7881 Bacteremia: Secondary | ICD-10-CM | POA: Diagnosis not present

## 2022-12-07 LAB — RENAL FUNCTION PANEL
Albumin: 2 g/dL — ABNORMAL LOW (ref 3.5–5.0)
Anion gap: 10 (ref 5–15)
BUN: 45 mg/dL — ABNORMAL HIGH (ref 6–20)
CO2: 19 mmol/L — ABNORMAL LOW (ref 22–32)
Calcium: 7.9 mg/dL — ABNORMAL LOW (ref 8.9–10.3)
Chloride: 111 mmol/L (ref 98–111)
Creatinine, Ser: 1.3 mg/dL — ABNORMAL HIGH (ref 0.61–1.24)
GFR, Estimated: >60 mL/min (ref >60)
Glucose, Bld: 96 mg/dL (ref 70–99)
Phosphorus: 3.6 mg/dL (ref 2.5–4.6)
Potassium: 3.8 mmol/L (ref 3.5–5.1)
Sodium: 140 mmol/L (ref 135–145)

## 2022-12-07 LAB — CBC
HCT: 32.1 % — ABNORMAL LOW (ref 39.0–52.0)
Hemoglobin: 10.4 g/dL — ABNORMAL LOW (ref 13.0–17.0)
MCH: 28.7 pg (ref 26.0–34.0)
MCHC: 32.4 g/dL (ref 30.0–36.0)
MCV: 88.7 fL (ref 80.0–100.0)
Platelets: 289 10*3/uL (ref 150–400)
RBC: 3.62 MIL/uL — ABNORMAL LOW (ref 4.22–5.81)
RDW: 14.8 % (ref 11.5–15.5)
WBC: 25.1 10*3/uL — ABNORMAL HIGH (ref 4.0–10.5)
nRBC: 0 % (ref 0.0–0.2)

## 2022-12-07 LAB — CULTURE, BLOOD (ROUTINE X 2)

## 2022-12-07 MED ORDER — LORAZEPAM 2 MG/ML IJ SOLN
1.0000 mg | Freq: Once | INTRAMUSCULAR | Status: AC | PRN
  Administered 2022-12-07: 1 mg via INTRAVENOUS
  Filled 2022-12-07: qty 1

## 2022-12-07 NOTE — Progress Notes (Signed)
Pharmacy Antibiotic Note  Hunter Reyes is a 46 y.o. male admitted on 12/02/2022 with MRSA bacteremia. Pharmacy has been consulted for Daptomycin dosing. ID following.  AKI on admit now resolving with SCr down to 1.3.  CK down to 201  Plan: - Continue Daptomycin 850 mg (8 mg/kg AdjBW) every 24 hours - CK check weekly on Thursdays - Will continue to follow renal function, culture results, LOT, and antibiotic de-escalation plans   Height: 6\' 2"  (188 cm) Weight: (!) 149 kg (328 lb 7.8 oz) IBW/kg (Calculated) : 82.2  Temp (24hrs), Avg:98.2 F (36.8 C), Min:97.9 F (36.6 C), Max:98.4 F (36.9 C)  Recent Labs  Lab 12/03/22 0007 12/03/22 0500 12/03/22 0553 12/04/22 1007 12/05/22 0524 12/06/22 0451 12/06/22 1509 12/07/22 0311 12/07/22 0314  WBC 32.2* 33.8*  --   --   --  34.3*  --  25.1*  --   CREATININE 20.99*  --    < > 3.41* 1.88* 1.48* 1.20  --  1.30*  LATICACIDVEN 1.2  --   --   --   --   --   --   --   --   VANCORANDOM  --   --   --   --  7  --   --   --   --    < > = values in this interval not displayed.     Estimated Creatinine Clearance: 110.5 mL/min (A) (by C-G formula based on SCr of 1.3 mg/dL (H)).    Allergies  Allergen Reactions   Darvocet [Propoxyphene N-Acetaminophen] Hives and Itching   Tramadol Hives and Itching   Other Hives    Antimicrobials this admission: Vancomycin 2g 12/11 x 1 dose Cefepime 12/11 >> 12/12 Flagyl 12/11 >> 12/12 12/13 Daptomycin >>  Dose adjustments this admission: VR 7 mcg/ml on 12/13 AM  Microbiology results:  12/3 UCx with MRSA and staph epi 12/11 BCX: MRSA 12/11 UCX: MRSA 12/13 BCx: staph aureus 12/13 Abscess: staph aureus 12/14: BCx:    Thank you for allowing [place] to participate in this patients care. Jens Som, PharmD 12/07/2022 1:24 PM  **Pharmacist phone directory can be found on [email or web address] listed under [place]**

## 2022-12-07 NOTE — Progress Notes (Signed)
Subjective:   No new complaints   Antibiotics:  Anti-infectives (From admission, onward)    Start     Dose/Rate Route Frequency Ordered Stop   12/05/22 1800  DAPTOmycin (CUBICIN) 850 mg in sodium chloride 0.9 % IVPB        8 mg/kg  108.9 kg (Adjusted) 134 mL/hr over 30 Minutes Intravenous Daily 12/05/22 1654     12/05/22 1200  DAPTOmycin (CUBICIN) 850 mg in sodium chloride 0.9 % IVPB  Status:  Discontinued        8 mg/kg  108.9 kg (Adjusted) 134 mL/hr over 30 Minutes Intravenous Daily 12/05/22 0923 12/05/22 1654   12/05/22 0800  vancomycin (VANCOREADY) IVPB 2000 mg/400 mL  Status:  Discontinued        2,000 mg 200 mL/hr over 120 Minutes Intravenous Every 24 hours 12/05/22 0632 12/05/22 0923   12/04/22 2200  ceFEPIme (MAXIPIME) 2 g in sodium chloride 0.9 % 100 mL IVPB  Status:  Discontinued        2 g 200 mL/hr over 30 Minutes Intravenous Every 12 hours 12/04/22 1242 12/05/22 0237   12/04/22 0600  ceFEPIme (MAXIPIME) 1 g in sodium chloride 0.9 % 100 mL IVPB  Status:  Discontinued        1 g 200 mL/hr over 30 Minutes Intravenous Every 24 hours 12/03/22 0508 12/04/22 1242   12/03/22 1100  ceFEPIme (MAXIPIME) 2 g in sodium chloride 0.9 % 100 mL IVPB        2 g 200 mL/hr over 30 Minutes Intravenous  Once 12/03/22 0418 12/03/22 1120   12/03/22 0508  vancomycin variable dose per unstable renal function (pharmacist dosing)  Status:  Discontinued         Does not apply See admin instructions 12/03/22 0508 12/05/22 0849   12/03/22 0500  metroNIDAZOLE (FLAGYL) IVPB 500 mg  Status:  Discontinued        500 mg 100 mL/hr over 60 Minutes Intravenous Every 12 hours 12/03/22 0418 12/05/22 0237   12/03/22 0430  vancomycin (VANCOCIN) IVPB 1000 mg/200 mL premix  Status:  Discontinued        1,000 mg 200 mL/hr over 60 Minutes Intravenous  Once 12/03/22 0418 12/03/22 0421   12/03/22 0415  vancomycin (VANCOREADY) IVPB 2000 mg/400 mL        2,000 mg 200 mL/hr over 120 Minutes  Intravenous  Once 12/03/22 0404 12/03/22 0630   12/03/22 0200  cefTRIAXone (ROCEPHIN) 2 g in sodium chloride 0.9 % 100 mL IVPB        2 g 200 mL/hr over 30 Minutes Intravenous  Once 12/03/22 0153 12/03/22 0238       Medications: Scheduled Meds:  buprenorphine-naloxone  1 tablet Sublingual BID   Chlorhexidine Gluconate Cloth  6 each Topical Q0600   enoxaparin (LOVENOX) injection  40 mg Subcutaneous Daily   gabapentin  600 mg Oral BID   LORazepam  1 mg Intravenous Once   sertraline  25 mg Oral Daily   sodium zirconium cyclosilicate  10 g Oral Once   Continuous Infusions:  DAPTOmycin (CUBICIN) 850 mg in sodium chloride 0.9 % IVPB 850 mg (12/06/22 2019)   sodium bicarbonate 150 mEq in dextrose 5 % 1,150 mL infusion 50 mL/hr at 12/07/22 0853   thiamine (VITAMIN B1) injection 500 mg (12/07/22 0857)   PRN Meds:.acetaminophen **OR** acetaminophen, albuterol, ondansetron **OR** ondansetron (ZOFRAN) IV, tiZANidine, traZODone    Objective: Weight change:   Intake/Output Summary (Last 24 hours)  at 12/07/2022 1107 Last data filed at 12/07/2022 0526 Gross per 24 hour  Intake 400 ml  Output 3000 ml  Net -2600 ml    Blood pressure 121/72, pulse 92, temperature 98.2 F (36.8 C), temperature source Oral, resp. rate 16, height 6\' 2"  (1.88 m), weight (!) 149 kg, SpO2 94 %. Temp:  [97.5 F (36.4 C)-98.2 F (36.8 C)] 98.2 F (36.8 C) (12/15 0400) Pulse Rate:  [88-105] 92 (12/15 0800) Resp:  [11-16] 16 (12/15 0800) BP: (121-138)/(65-79) 121/72 (12/15 0800) SpO2:  [91 %-94 %] 94 % (12/15 0800)  Physical Exam: Physical Exam Constitutional:      Appearance: He is well-developed.  HENT:     Head: Normocephalic and atraumatic.  Eyes:     Conjunctiva/sclera: Conjunctivae normal.  Cardiovascular:     Rate and Rhythm: Normal rate and regular rhythm.  Pulmonary:     Effort: Pulmonary effort is normal. No respiratory distress.     Breath sounds: No wheezing.  Abdominal:     General:  There is no distension.     Palpations: Abdomen is soft.  Musculoskeletal:        General: Normal range of motion.     Cervical back: Normal range of motion and neck supple.  Skin:    General: Skin is warm and dry.     Findings: No erythema or rash.  Neurological:     Mental Status: He is alert and oriented to person, place, and time.     Comments: LE strength improved  Psychiatric:        Mood and Affect: Mood normal.        Speech: Speech normal.        Cognition and Memory: Cognition is impaired. Memory is impaired.        Judgment: Judgment normal.      CBC:    BMET Recent Labs    12/06/22 1509 12/07/22 0314  NA 145 140  K 3.6 3.8  CL 119* 111  CO2 18* 19*  GLUCOSE 139* 96  BUN 53* 45*  CREATININE 1.20 1.30*  CALCIUM 8.2* 7.9*      Liver Panel  Recent Labs    12/06/22 0451 12/07/22 0314  ALBUMIN 2.2* 2.0*        Sedimentation Rate Recent Labs    12/05/22 1131  ESRSEDRATE 83*    C-Reactive Protein Recent Labs    12/05/22 1131  CRP 9.7*     Micro Results: Recent Results (from the past 720 hour(s))  Urine Culture     Status: Abnormal   Collection Time: 11/25/22  5:45 AM   Specimen: Urine, Clean Catch  Result Value Ref Range Status   Specimen Description   Final    URINE, CLEAN CATCH Performed at [facility], [address], [place], [place] [address]    Special Requests   Final    NONE Performed at [facility], [address] [place], [place] [address]    Culture (A)  Final    >=100,000 COLONIES/mL METHICILLIN RESISTANT STAPHYLOCOCCUS AUREUS >=100,000 COLONIES/mL STAPHYLOCOCCUS EPIDERMIDIS    Report Status 11/29/2022 FINAL  Final   Organism ID, Bacteria METHICILLIN RESISTANT STAPHYLOCOCCUS AUREUS (A)  Final   Organism ID, Bacteria STAPHYLOCOCCUS EPIDERMIDIS (A)  Final      Susceptibility   Methicillin resistant staphylococcus aureus - MIC*    CIPROFLOXACIN >=8 RESISTANT Resistant     GENTAMICIN <=0.5 SENSITIVE Sensitive      NITROFURANTOIN <=16 SENSITIVE Sensitive     OXACILLIN >=  4 RESISTANT Resistant     TETRACYCLINE <=1 SENSITIVE Sensitive     VANCOMYCIN 1 SENSITIVE Sensitive     TRIMETH/SULFA <=10 SENSITIVE Sensitive     CLINDAMYCIN <=0.25 SENSITIVE Sensitive     RIFAMPIN <=0.5 SENSITIVE Sensitive     Inducible Clindamycin NEGATIVE Sensitive     * >=100,000 COLONIES/mL METHICILLIN RESISTANT STAPHYLOCOCCUS AUREUS   Staphylococcus epidermidis - MIC*    CIPROFLOXACIN >=8 RESISTANT Resistant     GENTAMICIN <=0.5 SENSITIVE Sensitive     NITROFURANTOIN <=16 SENSITIVE Sensitive     OXACILLIN >=4 RESISTANT Resistant     TETRACYCLINE <=1 SENSITIVE Sensitive     VANCOMYCIN <=0.5 SENSITIVE Sensitive     TRIMETH/SULFA <=10 SENSITIVE Sensitive     CLINDAMYCIN <=0.25 SENSITIVE Sensitive     RIFAMPIN <=0.5 SENSITIVE Sensitive     Inducible Clindamycin NEGATIVE Sensitive     * >=100,000 COLONIES/mL STAPHYLOCOCCUS EPIDERMIDIS  Resp Panel by RT-PCR (Flu A&B, Covid) Anterior Nasal Swab     Status: None   Collection Time: 12/02/22 11:30 PM   Specimen: Anterior Nasal Swab  Result Value Ref Range Status   SARS Coronavirus 2 by RT PCR NEGATIVE NEGATIVE Final    Comment: (NOTE) SARS-CoV-2 target nucleic acids are NOT DETECTED.  The SARS-CoV-2 RNA is generally detectable in upper respiratory specimens during the acute phase of infection. The lowest concentration of SARS-CoV-2 viral copies this assay can detect is 138 copies/mL. A negative result does not preclude SARS-Cov-2 infection and should not be used as the sole basis for treatment or other patient management decisions. A negative result may occur with  improper specimen collection/handling, submission of specimen other than nasopharyngeal swab, presence of viral mutation(s) within the areas targeted by this assay, and inadequate number of viral copies(<138 copies/mL). A negative result must be combined with clinical observations, patient history, and  epidemiological information. The expected result is Negative.  Fact Sheet for Patients:  EntrepreneurPulse.com.au  Fact Sheet for Healthcare Providers:  IncredibleEmployment.be  This test is no t yet approved or cleared by the [place] FDA and  has been authorized for detection and/or diagnosis of SARS-CoV-2 by FDA under an Emergency Use Authorization (EUA). This EUA will remain  in effect (meaning this test can be used) for the duration of the COVID-19 declaration under Section 564(b)(1) of the Act, 21 U.S.C.section 360bbb-3(b)(1), unless the authorization is terminated  or revoked sooner.       Influenza A by PCR NEGATIVE NEGATIVE Final   Influenza B by PCR NEGATIVE NEGATIVE Final    Comment: (NOTE) The Xpert Xpress SARS-CoV-2/FLU/RSV plus assay is intended as an aid in the diagnosis of influenza from Nasopharyngeal swab specimens and should not be used as a sole basis for treatment. Nasal washings and aspirates are unacceptable for Xpert Xpress SARS-CoV-2/FLU/RSV testing.  Fact Sheet for Patients: EntrepreneurPulse.com.au  Fact Sheet for Healthcare Providers: IncredibleEmployment.be  This test is not yet approved or cleared by the [place] FDA and has been authorized for detection and/or diagnosis of SARS-CoV-2 by FDA under an Emergency Use Authorization (EUA). This EUA will remain in effect (meaning this test can be used) for the duration of the COVID-19 declaration under Section 564(b)(1) of the Act, 21 U.S.C. section 360bbb-3(b)(1), unless the authorization is terminated or revoked.  Performed at [facility], [address], [place], [place] [address]   Blood culture (routine x 2)     Status: Abnormal (Preliminary result)   Collection Time: 12/03/22  4:00 AM   Specimen:  Site Not Specified; Blood  Result Value Ref Range Status   Specimen Description   Final    SITE NOT SPECIFIED  BOTTLES DRAWN AEROBIC AND ANAEROBIC Performed at [facility], [address] [place], [place] [address]    Special Requests   Final    Blood Culture adequate volume Performed at [facility], [address], [place], [place] [address]    Culture  Setup Time   Final    GRAM POSITIVE COCCI AEB BOTTLES DRAWN AEROBIC ONLY Gram Stain Report Called to,Read Back By and Verified With: REECE THOMPSON,RN @2143  ON 12/04/22 BY [place]ANE CRITICAL RESULT CALLED TO, READ BACK BY AND VERIFIED WITH: G ABBOTT,PHARMD@0200  12/05/22 [place] GRAM POSITIVE COCCI ANAEROBIC BOTTLE ONLY RESULT PREVIOUSLY CALLED Performed at [facility], [address], [place], [place] [address]    Culture (A)  Final    STAPHYLOCOCCUS AUREUS SUSCEPTIBILITIES TO FOLLOW Performed at [facility], [place] [address], [place], [place] [address]    Report Status PENDING  Incomplete  Blood Culture ID Panel (Reflexed)     Status: Abnormal   Collection Time: 12/03/22  4:00 AM  Result Value Ref Range Status   Enterococcus faecalis NOT DETECTED NOT DETECTED Final   Enterococcus Faecium NOT DETECTED NOT DETECTED Final   Listeria monocytogenes NOT DETECTED NOT DETECTED Final   Staphylococcus species DETECTED (A) NOT DETECTED Final    Comment: CRITICAL RESULT CALLED TO, READ BACK BY AND VERIFIED WITH: G ABBOTT,PHARMD@0200  12/05/22 [place]    Staphylococcus aureus (BCID) DETECTED (A) NOT DETECTED Final    Comment: Methicillin (oxacillin)-resistant Staphylococcus aureus (MRSA). MRSA is predictably resistant to beta-lactam antibiotics (except ceftaroline). Preferred therapy is vancomycin unless clinically contraindicated. Patient requires contact precautions if  hospitalized. CRITICAL RESULT CALLED TO, READ BACK BY AND VERIFIED WITH: G ABBOTT,PHARMD@0200  12/05/22 [place]    Staphylococcus epidermidis NOT DETECTED NOT DETECTED Final   Staphylococcus lugdunensis NOT DETECTED NOT DETECTED Final   Streptococcus species NOT DETECTED NOT DETECTED Final    Streptococcus agalactiae NOT DETECTED NOT DETECTED Final   Streptococcus pneumoniae NOT DETECTED NOT DETECTED Final   Streptococcus pyogenes NOT DETECTED NOT DETECTED Final   A.calcoaceticus-baumannii NOT DETECTED NOT DETECTED Final   Bacteroides fragilis NOT DETECTED NOT DETECTED Final   Enterobacterales NOT DETECTED NOT DETECTED Final   Enterobacter cloacae complex NOT DETECTED NOT DETECTED Final   Escherichia coli NOT DETECTED NOT DETECTED Final   Klebsiella aerogenes NOT DETECTED NOT DETECTED Final   Klebsiella oxytoca NOT DETECTED NOT DETECTED Final   Klebsiella pneumoniae NOT DETECTED NOT DETECTED Final   Proteus species NOT DETECTED NOT DETECTED Final   Salmonella species NOT DETECTED NOT DETECTED Final   Serratia marcescens NOT DETECTED NOT DETECTED Final   Haemophilus influenzae NOT DETECTED NOT DETECTED Final   Neisseria meningitidis NOT DETECTED NOT DETECTED Final   Pseudomonas aeruginosa NOT DETECTED NOT DETECTED Final   Stenotrophomonas maltophilia NOT DETECTED NOT DETECTED Final   Candida albicans NOT DETECTED NOT DETECTED Final   Candida auris NOT DETECTED NOT DETECTED Final   Candida glabrata NOT DETECTED NOT DETECTED Final   Candida krusei NOT DETECTED NOT DETECTED Final   Candida parapsilosis NOT DETECTED NOT DETECTED Final   Candida tropicalis NOT DETECTED NOT DETECTED Final   Cryptococcus neoformans/gattii NOT DETECTED NOT DETECTED Final   Meth resistant mecA/C and MREJ DETECTED (A) NOT DETECTED Final    Comment: CRITICAL RESULT CALLED TO, READ BACK BY AND VERIFIED WITH: G ABBOTT,PHARMD@0200  12/05/22 [place] Performed at [facility],  [address] [place], [place] [address]   Urine Culture     Status: Abnormal   Collection Time: 12/03/22  4:01 AM   Specimen: Urine, Catheterized  Result Value Ref Range Status   Specimen Description   Final    URINE, CATHETERIZED Performed at [facility], [address] [place], [place] [address]    Special Requests    Final    NONE Performed at [facility], [address], [place], [place] [address]    Culture (A)  Final    20,000 COLONIES/mL METHICILLIN RESISTANT STAPHYLOCOCCUS AUREUS   Report Status 12/05/2022 FINAL  Final   Organism ID, Bacteria METHICILLIN RESISTANT STAPHYLOCOCCUS AUREUS (A)  Final      Susceptibility   Methicillin resistant staphylococcus aureus - MIC*    CIPROFLOXACIN >=8 RESISTANT Resistant     GENTAMICIN <=0.5 SENSITIVE Sensitive     NITROFURANTOIN 32 SENSITIVE Sensitive     OXACILLIN >=4 RESISTANT Resistant     TETRACYCLINE <=1 SENSITIVE Sensitive     VANCOMYCIN <=0.5 SENSITIVE Sensitive     TRIMETH/SULFA <=10 SENSITIVE Sensitive     CLINDAMYCIN <=0.25 SENSITIVE Sensitive     RIFAMPIN <=0.5 SENSITIVE Sensitive     Inducible Clindamycin NEGATIVE Sensitive     * 20,000 COLONIES/mL METHICILLIN RESISTANT STAPHYLOCOCCUS AUREUS  Blood culture (routine x 2)     Status: Abnormal (Preliminary result)   Collection Time: 12/03/22  4:10 AM   Specimen: BLOOD  Result Value Ref Range Status   Specimen Description BLOOD SITE NOT SPECIFIED  Final   Special Requests   Final    BOTTLES DRAWN AEROBIC AND ANAEROBIC Blood Culture adequate volume   Culture  Setup Time   Final    CRITICAL RESULT CALLED TO, READ BACK BY AND VERIFIED WITH: IN BOTH AEROBIC AND ANAEROBIC BOTTLES GRAM POSITIVE COCCI Gram Stain Report Called to,Read Back By and Verified With: NAKIA MCGOWAN @ 0865 ON 12/05/22 C [place] CRITICAL RESULT CALLED TO, READ BACK BY AND VERIFIED WITH: RN Minna Merritts [facility] ON 12/05/22 @ 1950 BY DRT    Culture (A)  Final    STAPHYLOCOCCUS AUREUS SUSCEPTIBILITIES TO FOLLOW Performed at [facility], [address] [place], [place] [address]    Report Status PENDING  Incomplete  Blood Culture ID Panel (Reflexed)     Status: Abnormal   Collection Time: 12/03/22  4:10 AM  Result Value Ref Range Status   Enterococcus faecalis NOT DETECTED NOT DETECTED Final   Enterococcus Faecium NOT  DETECTED NOT DETECTED Final   Listeria monocytogenes NOT DETECTED NOT DETECTED Final   Staphylococcus species DETECTED (A) NOT DETECTED Final    Comment: CRITICAL RESULT CALLED TO, READ BACK BY AND VERIFIED WITH: RN Minna Merritts [facility] ON 12/05/22 @ 1950 BY DRT    Staphylococcus aureus (BCID) DETECTED (A) NOT DETECTED Final    Comment: Methicillin (oxacillin)-resistant Staphylococcus aureus (MRSA). MRSA is predictably resistant to beta-lactam antibiotics (except ceftaroline). Preferred therapy is vancomycin unless clinically contraindicated. Patient requires contact precautions if  hospitalized. CRITICAL RESULT CALLED TO, READ BACK BY AND VERIFIED WITH: RN Minna Merritts [facility] ON 12/05/22 @ 1950 BY DRT    Staphylococcus epidermidis NOT DETECTED NOT DETECTED Final   Staphylococcus lugdunensis NOT DETECTED NOT DETECTED Final   Streptococcus species NOT DETECTED NOT DETECTED Final   Streptococcus agalactiae NOT DETECTED NOT DETECTED Final   Streptococcus pneumoniae NOT DETECTED NOT DETECTED Final   Streptococcus pyogenes NOT DETECTED NOT DETECTED Final   A.calcoaceticus-baumannii NOT DETECTED NOT DETECTED Final  Bacteroides fragilis NOT DETECTED NOT DETECTED Final   Enterobacterales NOT DETECTED NOT DETECTED Final   Enterobacter cloacae complex NOT DETECTED NOT DETECTED Final   Escherichia coli NOT DETECTED NOT DETECTED Final   Klebsiella aerogenes NOT DETECTED NOT DETECTED Final   Klebsiella oxytoca NOT DETECTED NOT DETECTED Final   Klebsiella pneumoniae NOT DETECTED NOT DETECTED Final   Proteus species NOT DETECTED NOT DETECTED Final   Salmonella species NOT DETECTED NOT DETECTED Final   Serratia marcescens NOT DETECTED NOT DETECTED Final   Haemophilus influenzae NOT DETECTED NOT DETECTED Final   Neisseria meningitidis NOT DETECTED NOT DETECTED Final   Pseudomonas aeruginosa NOT DETECTED NOT DETECTED Final   Stenotrophomonas maltophilia NOT DETECTED NOT DETECTED Final   Candida albicans NOT  DETECTED NOT DETECTED Final   Candida auris NOT DETECTED NOT DETECTED Final   Candida glabrata NOT DETECTED NOT DETECTED Final   Candida krusei NOT DETECTED NOT DETECTED Final   Candida parapsilosis NOT DETECTED NOT DETECTED Final   Candida tropicalis NOT DETECTED NOT DETECTED Final   Cryptococcus neoformans/gattii NOT DETECTED NOT DETECTED Final   Meth resistant mecA/C and MREJ DETECTED (A) NOT DETECTED Final    Comment: CRITICAL RESULT CALLED TO, READ BACK BY AND VERIFIED WITH: RN Minna Merritts [facility] ON 12/05/22 @ 1950 BY DRT Performed at [facility], [address], [place], [place] [address]   Culture, blood (Routine X 2) w Reflex to ID Panel     Status: Abnormal (Preliminary result)   Collection Time: 12/05/22  9:12 AM   Specimen: BLOOD  Result Value Ref Range Status   Specimen Description BLOOD LEFT ANTECUBITAL  Final   Special Requests   Final    BOTTLES DRAWN AEROBIC AND ANAEROBIC Blood Culture adequate volume   Culture  Setup Time   Final    GRAM POSITIVE COCCI IN CLUSTERS IN BOTH AEROBIC AND ANAEROBIC BOTTLES CRITICAL RESULT CALLED TO, READ BACK BY AND VERIFIED WITH: [place], AT 1334 12/06/22 D. VANHOOK Performed at [facility], [place] [address] [place], [place] [address]    Culture STAPHYLOCOCCUS AUREUS (A)  Final   Report Status PENDING  Incomplete  Culture, blood (Routine X 2) w Reflex to ID Panel     Status: None (Preliminary result)   Collection Time: 12/05/22  9:15 AM   Specimen: BLOOD LEFT WRIST  Result Value Ref Range Status   Specimen Description BLOOD LEFT WRIST  Final   Special Requests   Final    BOTTLES DRAWN AEROBIC AND ANAEROBIC Blood Culture adequate volume   Culture  Setup Time   Final    GRAM POSITIVE COCCI IN CLUSTERS AEROBIC BOTTLE ONLY CRITICAL VALUE NOTED.  VALUE IS CONSISTENT WITH PREVIOUSLY REPORTED AND CALLED VALUE. Performed at [facility], [place] [address] [place], [place] [address]    Culture GRAM POSITIVE COCCI   Final   Report Status PENDING  Incomplete  Aerobic/Anaerobic Culture w Gram Stain (surgical/deep wound)     Status: None (Preliminary result)   Collection Time: 12/05/22  7:52 PM   Specimen: Abscess  Result Value Ref Range Status   Specimen Description ABSCESS  Final   Special Requests LUMBAR 4 EPIDURAL SPACE  Final   Gram Stain   Final    RARE WBC PRESENT, PREDOMINANTLY PMN NO ORGANISMS SEEN    Culture   Final    RARE STAPHYLOCOCCUS AUREUS SUSCEPTIBILITIES TO FOLLOW Performed at [facility], [place] [address] [place], [place] [address]  Report Status PENDING  Incomplete  Culture, blood (Routine X 2) w Reflex to ID Panel     Status: None (Preliminary result)   Collection Time: 12/06/22  3:09 PM   Specimen: BLOOD LEFT HAND  Result Value Ref Range Status   Specimen Description BLOOD LEFT HAND  Final   Special Requests   Final    BOTTLES DRAWN AEROBIC ONLY Blood Culture adequate volume   Culture   Final    NO GROWTH < 24 HOURS Performed at [facility], [place] [address], [place], [place] [address]    Report Status PENDING  Incomplete  Culture, blood (Routine X 2) w Reflex to ID Panel     Status: None (Preliminary result)   Collection Time: 12/06/22  3:10 PM   Specimen: BLOOD RIGHT HAND  Result Value Ref Range Status   Specimen Description BLOOD RIGHT HAND  Final   Special Requests   Final    BOTTLES DRAWN AEROBIC AND ANAEROBIC Blood Culture adequate volume   Culture   Final    NO GROWTH < 24 HOURS Performed at [facility], [place] [address] [place], [place] [address]    Report Status PENDING  Incomplete    Studies/Results: MR FOOT LEFT WO CONTRAST  Result Date: 12/06/2022 CLINICAL DATA:  Left foot swelling. EXAM: MRI OF THE LEFT FOOT WITHOUT CONTRAST TECHNIQUE: Multiplanar, multisequence MR imaging of the left forefoot was performed. No intravenous contrast was administered. COMPARISON:  Left foot x-rays dated June 20, 2016. MRI left foot dated February 20, 2016. FINDINGS: Bones/Joint/Cartilage No marrow signal abnormality. Prior partial amputation of the third toe. No acute fracture or dislocation. Chronic fractures of the first proximal phalanx head and distal second metatarsal. Mild degenerative changes of the midfoot, first and second MTP joints, and first IP joint. No joint effusion. Muscles and Tendons Flexor and extensor tendons are intact. No tenosynovitis. Near complete fatty atrophy of the intrinsic foot muscles. Soft tissue No fluid collection or hematoma. Unchanged small ganglion cyst near the base of the fifth metatarsal. IMPRESSION: 1. No osteomyelitis or abscess. Electronically Signed   By: Titus Dubin M.D.   On: 12/06/2022 18:56   DG Lumbar Spine 2-3 Views  Result Date: 12/05/2022 CLINICAL DATA:  Lumbar laminectomy EXAM: LUMBAR SPINE - 2-3 VIEW COMPARISON:  MRI from earlier in the same day. FINDINGS: Initial film demonstrates a needle in the posterior soft tissues at the L3-4 interspace. Subsequent image shows retractor and surgical instruments at this level. IMPRESSION: Intraoperative localization for laminectomy. Electronically Signed   By: Inez Catalina M.D.   On: 12/05/2022 21:14   MR LUMBAR SPINE WO CONTRAST  Addendum Date: 12/05/2022   ADDENDUM REPORT: 12/05/2022 14:44 ADDENDUM: These results will be called to the ordering clinician or representative by the Radiologist Assistant, and communication documented in the PACS or [employer]. Electronically Signed   By: Margaretha Sheffield M.D.   On: 12/05/2022 14:44   Result Date: 12/05/2022 CLINICAL DATA:  Osteomyelitis, lumbar lumbar discitis , with surrounding inflammation EXAM: MRI LUMBAR SPINE WITHOUT CONTRAST TECHNIQUE: Multiplanar, multisequence MR imaging of the lumbar spine was performed. No intravenous contrast was administered. COMPARISON:  None Available. FINDINGS: Segmentation:  Standard. Alignment:  No substantial sagittal subluxation. Vertebrae: Abnormal marrow  edema at L3-L4, compatible with osteomyelitis given additional findings. Also, abnormal L3-L4 disc edema. Conus medullaris and cauda equina: Conus extends to the superior L1 level. Conus appears normal. Paraspinal and other soft tissues: Extensive surrounding edema/phlegmon involving the  left paravertebral soft tissues at L3-L4. Multiple probable paraspinal abscesses within the psoas musculature bilaterally, measuring up to 1.7 cm on the left and 1.5 cm on the right (for example, see series 8, image 30). The absence of contrast limits assessment. Disc levels: T12-L1: No significant disc protrusion, foraminal stenosis, or canal stenosis. L1-L2: Mild disc bulging. No significant stenosis. Mild disc bulging. L2-L3: Left eccentric disc bulge which result in mild left subarticular recess and left foraminal stenosis. No significant canal or right foraminal stenosis. L3-L4: Abnormal T2 intermediate signal within the ventral canal extending from L3 to L4-L5, measuring up to 9 by 27 mm in transverse dimensions at the superior L4 level and compatible with ventral epidural phlegmon. Absence of contrast limits assessment for peripheral enhancement. L4-L5: Small amount of epidural phlegmon in the ventral canal at this level. Mild disc bulging. Resulting mild canal stenosis. Bilateral facet arthropathy with mild to moderate bilateral foraminal stenosis. L5-S1: Right eccentric disc and endplate spurring with right greater than left facet arthropathy. Resulting severe right and moderate left foraminal stenosis. Right subarticular recess stenosis with potential involvement of the descending right S1 nerve root. Patent canal. IMPRESSION: 1. 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 2. Multiple probable paraspinal abscesses within the psoas musculature bilaterally, measuring up to  1.7 cm on the left and 1.5 cm on the right. 3. Postcontrast imaging could better assess the above findings if clinically warranted. 4. Superimposed multilevel degenerative change, greatest at L5-S1 where there is severe right and moderate left foraminal stenosis. Also, narrowing of the right subarticular recess at this level. Electronically Signed: By: Margaretha Sheffield M.D. On: 12/05/2022 14:39      Assessment/Plan:  INTERVAL HISTORY:   MRI of left foot without osteomyelitis    Principal Problem:   MRSA bacteremia Active Problems:   AKI (acute kidney injury) ([place])   Diskitis   Bipolar 1 disorder ([facility])   Peripheral neuropathy   Polysubstance abuse ([place])   Metabolic acidosis   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 ([place])    Hunter Reyes is a 46 y.o. 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 He is also bacteremic with MRSA which is culprit organism for this "metastatic" infection  He is now sp Right L4 laminotomy, sublaminar decompression with debridement of ventral epidural abscess  by Dr. 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  Cultures were still positive and so I repeated them again yesterday.  Also growing Staphylococcus aureus from the operative culture unsurprisingly.  DO NOT PLACE new PICC or central line until we have cleared his bacteremia  If he DOES have persistently + blood cultures I would then pursue TEE but in the meantime I cannot see it ranging his antibiotic plan and overall care (I would doubt he would undergo CT surgery)  MRI one foot negative, he refused the other one (right)  today   2 Chronic hepatitis B without hepatic coma: he actually does NOT fortunately have hepatitis B--I had misread his 12/11 Sag as + but it was not interpretable. Repeat assay is negative  I spent 51minutes with the patient including than 50% of the time in face to face counseling of the patient re his metastatic MRSA infection personally reviewing MRI left foot along with review of medical records in preparation for the visit and during the visit and in coordination of his care.      LOS: 4 days   Alcide Evener 12/07/2022, 11:07 AM

## 2022-12-07 NOTE — Progress Notes (Signed)
Pt brought to MRI for attempt at right foot exam. Unable to obtain even a viable localizer before pt refused to continue. Advised pt of time remaining and time needed to obtain at least a limited study. Pt again stated he could not do it due to being uncomfortable on imaging table. Offered pt the possibility of meds if we called RN but pt declined meds going on to say he didn't think any meds would help enough to get him through. Pt ultimately refused exam. Pt transferred back to bed and sent back to room.

## 2022-12-07 NOTE — Progress Notes (Signed)
PROGRESS NOTE    Hunter Reyes  MHD:[id number] DOB: Mar 21, 1976 DOA: 12/02/2022 PCP: Celene Squibb, MD     Chief Complaint  Patient presents with   Urinary Retention   Back Pain    Brief Narrative:  Hunter Reyes is a 46 y.o. Caucasian male with medical history significant for bipolar 1 disorder, borderline personality disorder, COPD, crack cocaine abuse, depression, peripheral neuropathy, history of right foot osteomyelitis, panic attack and history of seizures, who presented to the emergency room with acute onset of altered mental status with associated urinary retention and low back pain.  The patient has not urinated for 3 days.  Hlabs hyperkalemia 5.8, BUN more than 300 and creatinine 20.99 with calcium of 8.8 anion gap of 26, albumin of 2.7 and total protein of 8.9.  Serum lipase was 222.  Lactic acid was 1.2 and CK 40.  CBC showed leukocytosis of 32.2 with neutrophilia and thrombocytosis of 411 with mild anemia with hemoglobin of 12.8 hematocrit 27.1.  Previous BUN and creatinine were 8 and 0.95 however on 09/28/2017.  Assessment & Plan:   Principal Problem:   MRSA bacteremia Active Problems:   AKI (acute kidney injury) ([place])   Diskitis   Bipolar 1 disorder ([place])   Peripheral neuropathy   Polysubstance abuse (H[facility])   Metabolic acidosis   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 (HCC)    AKI (acute kidney injury) ([place]) Urinary retention - his is likely prerenal due to severe dehydration as well as obstructive uropathy and the use of nephrotoxic agents (NSAIDs). -At time of admission BUN about 300 and creatinine more than 20. -After Foley catheter placed and fluid resuscitation provided  -Function continues to improve, creatinine is 1.3 today, -Continue to monitor electrolyte closely for postobstructive diuresis - continue with flomax  Hyperkalemia  - Due to AKI, resolved with Lokelma  Hypernatremia Hyperchloremia Metabolic acidosis  -This has resolved  with bicarb W, will lower rate, to continue for next 24 hours as bicarb level still at 19.  Diskitis/MRSA bacteremia/epidural abscess -Discitis as evident on imaging -Patient appears with lower extremity weakness, urinary retention, possible incontinence, unclear acuity, as report last time he was able to stand up was a month ago -Management per ID, vancomycin has been switched to daptomycin -  sp Right L4 laminotomy, sublaminar decompression with debridement of ventral epidural abscess  by Dr. Kathyrn Sheriff  -Concern of both feet having osteomyelitis, could be the source, MRI is pending. -TEE with no vegetation, at this point unclear if he will need TEE or not as likely will need to finish at least 6 weeks of IV antibiotics.   Peripheral neuropathy -Continue Neurontin and adjusted dose   Bipolar 1 disorder ([place]) -No suicidal ideation or hallucination -Stable mood currently -Continue Abilify-antidepressant therapy.   Polysubstance abuse ([place]) -This includes cocaine and likely opiates. -Continue the use of Suboxone   Bilateral heel ulcers POA  - cont with wound care, MRI of both feet tomorrow.    Hypomagnesemia Hypophosphatemia -Repleted  Hepatitis B infection -ID is following    DVT prophylaxis: Lovenox Code Status: Full Family Communication: None at bedside Disposition:   Status is: Inpatient    Consultants:  ID Neurosurgery  Subjective:  No significant events overnight as discussed with staff, he denies any complaints today. Objective: Vitals:   12/07/22 0400 12/07/22 0600 12/07/22 0800 12/07/22 1225  BP: 124/65  121/72 122/62  Pulse: 99 88 92   Resp: 13 14 16    Temp: 98.2 F (36.8  C)  98.4 F (36.9 C) 98.2 F (36.8 C)  TempSrc: Oral  Axillary Axillary  SpO2: 93% 91% 94%   Weight:      Height:        Intake/Output Summary (Last 24 hours) at 12/07/2022 1503 Last data filed at 12/07/2022 1225 Gross per 24 hour  Intake 400 ml  Output 2800 ml  Net -2400  ml   Filed Weights   12/02/22 2230  Weight: (!) 149 kg    Examination:   Awake Alert, pleasant, mildly confused Symmetrical Chest wall movement, Good air movement bilaterally, CTAB RRR,No Gallops,Rubs or new Murmurs, No Parasternal Heave +ve B.Sounds, Abd Soft, No tenderness, No rebound - guarding or rigidity. No Cyanosis, Clubbing or edema, No new Rash or bruise      Data Reviewed: I have personally reviewed following labs and imaging studies  CBC: Recent Labs  Lab 12/03/22 0007 12/03/22 0500 12/06/22 0451 12/07/22 0311  WBC 32.2* 33.8* 34.3* 25.1*  NEUTROABS 27.0*  --   --   --   HGB 12.8* 13.1 11.1* 10.4*  HCT 37.1* 38.4* 33.4* 32.1*  MCV 82.8 84.2 88.1 88.7  PLT 411* 446* 321 993    Basic Metabolic Panel: Recent Labs  Lab 12/04/22 1007 12/05/22 0524 12/06/22 0451 12/06/22 1509 12/07/22 0314  NA 148* 149* 148* 145 140  K 5.1 5.3* 5.4* 3.6 3.8  CL 122* 125* 124* 119* 111  CO2 15* 15* 15* 18* 19*  GLUCOSE 110* 110* 125* 139* 96  BUN 118* 89* 61* 53* 45*  CREATININE 3.41* 1.88* 1.48* 1.20 1.30*  CALCIUM 8.9 8.8* 8.3* 8.2* 7.9*  PHOS 5.7* 4.7* 4.6  --  3.6    GFR: Estimated Creatinine Clearance: 110.5 mL/min (A) (by C-G formula based on SCr of 1.3 mg/dL (H)).  Liver Function Tests: Recent Labs  Lab 12/03/22 0007 12/04/22 1007 12/05/22 0524 12/06/22 0451 12/07/22 0314  AST 17  --   --   --   --   ALT 15  --   --   --   --   ALKPHOS 99  --   --   --   --   BILITOT 1.0  --   --   --   --   PROT 8.9*  --   --   --   --   ALBUMIN 2.7* 2.4* 2.3* 2.2* 2.0*    CBG: Recent Labs  Lab 12/06/22 2103  GLUCAP 110*     Recent Results (from the past 240 hour(s))  Resp Panel by RT-PCR (Flu A&B, Covid) Anterior Nasal Swab     Status: None   Collection Time: 12/02/22 11:30 PM   Specimen: Anterior Nasal Swab  Result Value Ref Range Status   SARS Coronavirus 2 by RT PCR NEGATIVE NEGATIVE Final    Comment: (NOTE) SARS-CoV-2 target nucleic acids are NOT  DETECTED.  The SARS-CoV-2 RNA is generally detectable in upper respiratory specimens during the acute phase of infection. The lowest concentration of SARS-CoV-2 viral copies this assay can detect is 138 copies/mL. A negative result does not preclude SARS-Cov-2 infection and should not be used as the sole basis for treatment or other patient management decisions. A negative result may occur with  improper specimen collection/handling, submission of specimen other than nasopharyngeal swab, presence of viral mutation(s) within the areas targeted by this assay, and inadequate number of viral copies(<138 copies/mL). A negative result must be combined with clinical observations, patient history, and epidemiological information. The expected result is  Negative.  Fact Sheet for Patients:  EntrepreneurPulse.com.au  Fact Sheet for Healthcare Providers:  IncredibleEmployment.be  This test is no t yet approved or cleared by the [place] FDA and  has been authorized for detection and/or diagnosis of SARS-CoV-2 by FDA under an Emergency Use Authorization (EUA). This EUA will remain  in effect (meaning this test can be used) for the duration of the COVID-19 declaration under Section 564(b)(1) of the Act, 21 U.S.C.section 360bbb-3(b)(1), unless the authorization is terminated  or revoked sooner.       Influenza A by PCR NEGATIVE NEGATIVE Final   Influenza B by PCR NEGATIVE NEGATIVE Final    Comment: (NOTE) The Xpert Xpress SARS-CoV-2/FLU/RSV plus assay is intended as an aid in the diagnosis of influenza from Nasopharyngeal swab specimens and should not be used as a sole basis for treatment. Nasal washings and aspirates are unacceptable for Xpert Xpress SARS-CoV-2/FLU/RSV testing.  Fact Sheet for Patients: EntrepreneurPulse.com.au  Fact Sheet for Healthcare Providers: IncredibleEmployment.be  This test is not yet  approved or cleared by the [place] FDA and has been authorized for detection and/or diagnosis of SARS-CoV-2 by FDA under an Emergency Use Authorization (EUA). This EUA will remain in effect (meaning this test can be used) for the duration of the COVID-19 declaration under Section 564(b)(1) of the Act, 21 U.S.C. section 360bbb-3(b)(1), unless the authorization is terminated or revoked.  Performed at [facility], [address], [place], [place] [address]   Blood culture (routine x 2)     Status: Abnormal (Preliminary result)   Collection Time: 12/03/22  4:00 AM   Specimen: Site Not Specified; Blood  Result Value Ref Range Status   Specimen Description   Final    SITE NOT SPECIFIED BOTTLES DRAWN AEROBIC AND ANAEROBIC Performed at [facility], [address] [place], [place] [address]    Special Requests   Final    Blood Culture adequate volume Performed at [facility], [address], [place], [place] [address]    Culture  Setup Time   Final    GRAM POSITIVE COCCI AEB BOTTLES DRAWN AEROBIC ONLY Gram Stain Report Called to,Read Back By and Verified With: REECE THOMPSON,RN @2143  ON 12/04/22 BY [place] CRITICAL RESULT CALLED TO, READ BACK BY AND VERIFIED WITH: G ABBOTT,PHARMD@0200  12/05/22 [place] GRAM POSITIVE COCCI ANAEROBIC BOTTLE ONLY RESULT PREVIOUSLY CALLED Performed at [facility], [address], [place], [place] [address]    Culture (A)  Final    STAPHYLOCOCCUS AUREUS SUSCEPTIBILITIES TO FOLLOW Performed at [facility], [place] [address], [place], [place] [address]    Report Status PENDING  Incomplete  Blood Culture ID Panel (Reflexed)     Status: Abnormal   Collection Time: 12/03/22  4:00 AM  Result Value Ref Range Status   Enterococcus faecalis NOT DETECTED NOT DETECTED Final   Enterococcus Faecium NOT DETECTED NOT DETECTED Final   Listeria monocytogenes NOT DETECTED NOT DETECTED Final   Staphylococcus species DETECTED (A) NOT DETECTED Final    Comment: CRITICAL  RESULT CALLED TO, READ BACK BY AND VERIFIED WITH: G ABBOTT,PHARMD@0200  12/05/22 [place]    Staphylococcus aureus (BCID) DETECTED (A) NOT DETECTED Final    Comment: Methicillin (oxacillin)-resistant Staphylococcus aureus (MRSA). MRSA is predictably resistant to beta-lactam antibiotics (except ceftaroline). Preferred therapy is vancomycin unless clinically contraindicated. Patient requires contact precautions if  hospitalized. CRITICAL RESULT CALLED TO, READ BACK BY AND VERIFIED WITH: G ABBOTT,PHARMD@0200  12/05/22 [place]    Staphylococcus epidermidis NOT DETECTED NOT DETECTED Final  Staphylococcus lugdunensis NOT DETECTED NOT DETECTED Final   Streptococcus species NOT DETECTED NOT DETECTED Final   Streptococcus agalactiae NOT DETECTED NOT DETECTED Final   Streptococcus pneumoniae NOT DETECTED NOT DETECTED Final   Streptococcus pyogenes NOT DETECTED NOT DETECTED Final   A.calcoaceticus-baumannii NOT DETECTED NOT DETECTED Final   Bacteroides fragilis NOT DETECTED NOT DETECTED Final   Enterobacterales NOT DETECTED NOT DETECTED Final   Enterobacter cloacae complex NOT DETECTED NOT DETECTED Final   Escherichia coli NOT DETECTED NOT DETECTED Final   Klebsiella aerogenes NOT DETECTED NOT DETECTED Final   Klebsiella oxytoca NOT DETECTED NOT DETECTED Final   Klebsiella pneumoniae NOT DETECTED NOT DETECTED Final   Proteus species NOT DETECTED NOT DETECTED Final   Salmonella species NOT DETECTED NOT DETECTED Final   Serratia marcescens NOT DETECTED NOT DETECTED Final   Haemophilus influenzae NOT DETECTED NOT DETECTED Final   Neisseria meningitidis NOT DETECTED NOT DETECTED Final   Pseudomonas aeruginosa NOT DETECTED NOT DETECTED Final   Stenotrophomonas maltophilia NOT DETECTED NOT DETECTED Final   Candida albicans NOT DETECTED NOT DETECTED Final   Candida auris NOT DETECTED NOT DETECTED Final   Candida glabrata NOT DETECTED NOT DETECTED Final   Candida krusei NOT DETECTED NOT DETECTED Final   Candida  parapsilosis NOT DETECTED NOT DETECTED Final   Candida tropicalis NOT DETECTED NOT DETECTED Final   Cryptococcus neoformans/gattii NOT DETECTED NOT DETECTED Final   Meth resistant mecA/C and MREJ DETECTED (A) NOT DETECTED Final    Comment: CRITICAL RESULT CALLED TO, READ BACK BY AND VERIFIED WITH: G ABBOTT,PHARMD@0200  12/05/22 [place] Performed at [facility], [address], [place], [place] [address]   Urine Culture     Status: Abnormal   Collection Time: 12/03/22  4:01 AM   Specimen: Urine, Catheterized  Result Value Ref Range Status   Specimen Description   Final    URINE, CATHETERIZED Performed at [facility], [address] [place], [place] [address]    Special Requests   Final    NONE Performed at [facility], [address] [place], [place] [address]    Culture (A)  Final    20,000 COLONIES/mL METHICILLIN RESISTANT STAPHYLOCOCCUS AUREUS   Report Status 12/05/2022 FINAL  Final   Organism ID, Bacteria METHICILLIN RESISTANT STAPHYLOCOCCUS AUREUS (A)  Final      Susceptibility   Methicillin resistant staphylococcus aureus - MIC*    CIPROFLOXACIN >=8 RESISTANT Resistant     GENTAMICIN <=0.5 SENSITIVE Sensitive     NITROFURANTOIN 32 SENSITIVE Sensitive     OXACILLIN >=4 RESISTANT Resistant     TETRACYCLINE <=1 SENSITIVE Sensitive     VANCOMYCIN <=0.5 SENSITIVE Sensitive     TRIMETH/SULFA <=10 SENSITIVE Sensitive     CLINDAMYCIN <=0.25 SENSITIVE Sensitive     RIFAMPIN <=0.5 SENSITIVE Sensitive     Inducible Clindamycin NEGATIVE Sensitive     * 20,000 COLONIES/mL METHICILLIN RESISTANT STAPHYLOCOCCUS AUREUS  Blood culture (routine x 2)     Status: Abnormal (Preliminary result)   Collection Time: 12/03/22  4:10 AM   Specimen: BLOOD  Result Value Ref Range Status   Specimen Description BLOOD SITE NOT SPECIFIED  Final   Special Requests   Final    BOTTLES DRAWN AEROBIC AND ANAEROBIC Blood Culture adequate volume   Culture  Setup Time   Final    CRITICAL RESULT CALLED TO,  READ BACK BY AND VERIFIED WITH: IN BOTH AEROBIC AND ANAEROBIC BOTTLES GRAM POSITIVE COCCI Gram Stain Report Called to,Read Back By and  Verified With: NAKIA MCGOWAN @ 1884 ON 12/05/22 [place] CRITICAL RESULT CALLED TO, READ BACK BY AND VERIFIED WITH: RN Minna Merritts [facility] ON 12/05/22 @ 1950 BY DRT    Culture (A)  Final    STAPHYLOCOCCUS AUREUS SUSCEPTIBILITIES TO FOLLOW Performed at [facility], [place] [address], [place], [place] [address]    Report Status PENDING  Incomplete  Blood Culture ID Panel (Reflexed)     Status: Abnormal   Collection Time: 12/03/22  4:10 AM  Result Value Ref Range Status   Enterococcus faecalis NOT DETECTED NOT DETECTED Final   Enterococcus Faecium NOT DETECTED NOT DETECTED Final   Listeria monocytogenes NOT DETECTED NOT DETECTED Final   Staphylococcus species DETECTED (A) NOT DETECTED Final    Comment: CRITICAL RESULT CALLED TO, READ BACK BY AND VERIFIED WITH: RN Minna Merritts [facility] ON 12/05/22 @ 1950 BY DRT    Staphylococcus aureus (BCID) DETECTED (A) NOT DETECTED Final    Comment: Methicillin (oxacillin)-resistant Staphylococcus aureus (MRSA). MRSA is predictably resistant to beta-lactam antibiotics (except ceftaroline). Preferred therapy is vancomycin unless clinically contraindicated. Patient requires contact precautions if  hospitalized. CRITICAL RESULT CALLED TO, READ BACK BY AND VERIFIED WITH: RN Minna Merritts [facility] ON 12/05/22 @ 1950 BY DRT    Staphylococcus epidermidis NOT DETECTED NOT DETECTED Final   Staphylococcus lugdunensis NOT DETECTED NOT DETECTED Final   Streptococcus species NOT DETECTED NOT DETECTED Final   Streptococcus agalactiae NOT DETECTED NOT DETECTED Final   Streptococcus pneumoniae NOT DETECTED NOT DETECTED Final   Streptococcus pyogenes NOT DETECTED NOT DETECTED Final   A.calcoaceticus-baumannii NOT DETECTED NOT DETECTED Final   Bacteroides fragilis NOT DETECTED NOT DETECTED Final   Enterobacterales NOT DETECTED NOT DETECTED Final    Enterobacter cloacae complex NOT DETECTED NOT DETECTED Final   Escherichia coli NOT DETECTED NOT DETECTED Final   Klebsiella aerogenes NOT DETECTED NOT DETECTED Final   Klebsiella oxytoca NOT DETECTED NOT DETECTED Final   Klebsiella pneumoniae NOT DETECTED NOT DETECTED Final   Proteus species NOT DETECTED NOT DETECTED Final   Salmonella species NOT DETECTED NOT DETECTED Final   Serratia marcescens NOT DETECTED NOT DETECTED Final   Haemophilus influenzae NOT DETECTED NOT DETECTED Final   Neisseria meningitidis NOT DETECTED NOT DETECTED Final   Pseudomonas aeruginosa NOT DETECTED NOT DETECTED Final   Stenotrophomonas maltophilia NOT DETECTED NOT DETECTED Final   Candida albicans NOT DETECTED NOT DETECTED Final   Candida auris NOT DETECTED NOT DETECTED Final   Candida glabrata NOT DETECTED NOT DETECTED Final   Candida krusei NOT DETECTED NOT DETECTED Final   Candida parapsilosis NOT DETECTED NOT DETECTED Final   Candida tropicalis NOT DETECTED NOT DETECTED Final   Cryptococcus neoformans/gattii NOT DETECTED NOT DETECTED Final   Meth resistant mecA/C and MREJ DETECTED (A) NOT DETECTED Final    Comment: CRITICAL RESULT CALLED TO, READ BACK BY AND VERIFIED WITH: RN Minna Merritts [facility] ON 12/05/22 @ 1950 BY DRT Performed at [facility], [address], [place], [place] [address]   Culture, blood (Routine X 2) w Reflex to ID Panel     Status: Abnormal (Preliminary result)   Collection Time: 12/05/22  9:12 AM   Specimen: BLOOD  Result Value Ref Range Status   Specimen Description BLOOD LEFT ANTECUBITAL  Final   Special Requests   Final    BOTTLES DRAWN AEROBIC AND ANAEROBIC Blood Culture adequate volume   Culture  Setup Time   Final    GRAM POSITIVE COCCI IN CLUSTERS IN BOTH AEROBIC AND  ANAEROBIC BOTTLES CRITICAL RESULT CALLED TO, READ BACK BY AND VERIFIED WITHFerne Coe PHARMD, AT 1334 12/06/22 D. [place] Performed at [facility], [place] [address], [place], [place] [address]     Culture STAPHYLOCOCCUS AUREUS (A)  Final   Report Status PENDING  Incomplete  Culture, blood (Routine X 2) w Reflex to ID Panel     Status: None (Preliminary result)   Collection Time: 12/05/22  9:15 AM   Specimen: BLOOD LEFT WRIST  Result Value Ref Range Status   Specimen Description BLOOD LEFT WRIST  Final   Special Requests   Final    BOTTLES DRAWN AEROBIC AND ANAEROBIC Blood Culture adequate volume   Culture  Setup Time   Final    GRAM POSITIVE COCCI IN CLUSTERS AEROBIC BOTTLE ONLY CRITICAL VALUE NOTED.  VALUE IS CONSISTENT WITH PREVIOUSLY REPORTED AND CALLED VALUE. Performed at [facility], [place] [address] [place], [place] [address]    Culture GRAM POSITIVE COCCI  Final   Report Status PENDING  Incomplete  Aerobic/Anaerobic Culture w Gram Stain (surgical/deep wound)     Status: None (Preliminary result)   Collection Time: 12/05/22  7:52 PM   Specimen: Abscess  Result Value Ref Range Status   Specimen Description ABSCESS  Final   Special Requests LUMBAR 4 EPIDURAL SPACE  Final   Gram Stain   Final    RARE WBC PRESENT, PREDOMINANTLY PMN NO ORGANISMS SEEN Performed at [facility], [place] [address], [place], [place] [address]    Culture   Final    RARE STAPHYLOCOCCUS AUREUS SUSCEPTIBILITIES TO FOLLOW NO ANAEROBES ISOLATED; CULTURE IN PROGRESS FOR 5 DAYS    Report Status PENDING  Incomplete  Culture, blood (Routine X 2) w Reflex to ID Panel     Status: None (Preliminary result)   Collection Time: 12/06/22  3:09 PM   Specimen: BLOOD LEFT HAND  Result Value Ref Range Status   Specimen Description BLOOD LEFT HAND  Final   Special Requests   Final    BOTTLES DRAWN AEROBIC ONLY Blood Culture adequate volume   Culture   Final    NO GROWTH < 24 HOURS Performed at [facility], [place] [address] [place], [place] [address]    Report Status PENDING  Incomplete  Culture, blood (Routine X 2) w Reflex to ID Panel     Status: None (Preliminary result)   Collection  Time: 12/06/22  3:10 PM   Specimen: BLOOD RIGHT HAND  Result Value Ref Range Status   Specimen Description BLOOD RIGHT HAND  Final   Special Requests   Final    BOTTLES DRAWN AEROBIC AND ANAEROBIC Blood Culture adequate volume   Culture   Final    NO GROWTH < 24 HOURS Performed at [facility], [place] [address] [place], [place] [address]    Report Status PENDING  Incomplete         Radiology Studies: MR FOOT LEFT WO CONTRAST  Result Date: 12/06/2022 CLINICAL DATA:  Left foot swelling. EXAM: MRI OF THE LEFT FOOT WITHOUT CONTRAST TECHNIQUE: Multiplanar, multisequence MR imaging of the left forefoot was performed. No intravenous contrast was administered. COMPARISON:  Left foot x-rays dated June 20, 2016. MRI left foot dated February 20, 2016. FINDINGS: Bones/Joint/Cartilage No marrow signal abnormality. Prior partial amputation of the third toe. No acute fracture or dislocation. Chronic fractures of the first proximal phalanx head and distal second metatarsal. Mild degenerative changes of the midfoot, first  and second MTP joints, and first IP joint. No joint effusion. Muscles and Tendons Flexor and extensor tendons are intact. No tenosynovitis. Near complete fatty atrophy of the intrinsic foot muscles. Soft tissue No fluid collection or hematoma. Unchanged small ganglion cyst near the base of the fifth metatarsal. IMPRESSION: 1. No osteomyelitis or abscess. Electronically Signed   By: Titus Dubin M.D.   On: 12/06/2022 18:56   DG Lumbar Spine 2-3 Views  Result Date: 12/05/2022 CLINICAL DATA:  Lumbar laminectomy EXAM: LUMBAR SPINE - 2-3 VIEW COMPARISON:  MRI from earlier in the same day. FINDINGS: Initial film demonstrates a needle in the posterior soft tissues at the L3-4 interspace. Subsequent image shows retractor and surgical instruments at this level. IMPRESSION: Intraoperative localization for laminectomy. Electronically Signed   By: Inez Catalina M.D.   On: 12/05/2022 21:14         Scheduled Meds:  buprenorphine-naloxone  1 tablet Sublingual BID   Chlorhexidine Gluconate Cloth  6 each Topical Q0600   enoxaparin (LOVENOX) injection  40 mg Subcutaneous Daily   gabapentin  600 mg Oral BID   LORazepam  1 mg Intravenous Once   sertraline  25 mg Oral Daily   sodium zirconium cyclosilicate  10 g Oral Once   Continuous Infusions:  DAPTOmycin (CUBICIN) 850 mg in sodium chloride 0.9 % IVPB 850 mg (12/06/22 2019)   sodium bicarbonate 150 mEq in dextrose 5 % 1,150 mL infusion 50 mL/hr at 12/07/22 1452   thiamine (VITAMIN B1) injection 500 mg (12/07/22 0857)     LOS: 4 days        Phillips Climes, MD [facility]   To contact the attending provider between 7A-7P or the covering provider during after hours 7P-7A, please log into the web site [email or web address] and access using universal [place] password for that web site. If you do not have the password, please call the hospital operator.  12/07/2022, 3:03 PM

## 2022-12-07 NOTE — Progress Notes (Signed)
Physical Therapy Treatment Patient Details Name: Hunter Reyes MRN: [id number] DOB: Jul 06, 1976 Today's Date: 12/07/2022   History of Present Illness Hunter Reyes is a 46 y.o. Caucasian male admitted 12/10 who presented to the emergency room with acute onset of altered mental status with associated urinary retention and low back pain. Underwent L4 lumbar laminectomy for epidural abscess 12/13. PMH:  bipolar 1 disorder, borderline personality disorder, COPD, crack cocaine abuse, depression, peripheral neuropathy, history of right foot osteomyelitis, panic attack and history of seizures    PT Comments    Received pt semi-reclined in bed, initially not agreeable to PT treatment, but with encuragement pt reluctantly agreed. Pt rolled with min A, required mod A for sidelying<>sitting EOB, and min guard for sit<>supine. Pt required cues for logroll technique and able to maintain sitting balance with supervision. Pt politely refused standing and requested to lie back down to sleep. Pt limited by pain and fatigue during session. If participation does not improve, pt may be more appropriate for SNF vs HHPT. Acute PT to cont to follow.    Recommendations for follow up therapy are one component of a multi-disciplinary discharge planning process, led by the attending physician.  Recommendations may be updated based on patient status, additional functional criteria and insurance authorization.  Follow Up Recommendations  Home health PT     Assistance Recommended at Discharge Frequent or constant Supervision/Assistance  Patient can return home with the following Assistance with cooking/housework;Assist for transportation;Help with stairs or ramp for entrance;A lot of help with walking and/or transfers   Equipment Recommendations  Rolling walker (2 wheels);BSC/3in1    Recommendations for Other Services       Precautions / Restrictions Precautions Precautions: Fall Precaution Comments: logroll  technique for comfort Restrictions Weight Bearing Restrictions: No     Mobility  Bed Mobility Overal bed mobility: Needs Assistance Bed Mobility: Rolling, Sidelying to Sit, Sit to Supine Rolling: Min assist Sidelying to sit: Mod assist   Sit to supine: Min guard   General bed mobility comments: HOB elevated and use of bedrails - required cues for logroll technique and pursed lip breathing Patient Response: Flat affect  Transfers                   General transfer comment: Pt politely refused to stand    Ambulation/Gait               General Gait Details: pt refused to attempt   Stairs             Wheelchair Mobility    Modified Rankin (Stroke Patients Only)       Balance Overall balance assessment: Needs assistance Sitting-balance support: Feet supported, Bilateral upper extremity supported Sitting balance-[name] Scale: Fair Sitting balance - Comments: can sit without UE support but preferred to have BUE support. Pt fatigued quickly and wanted to lie back down to go to sleep                                    Cognition Arousal/Alertness: Awake/alert Behavior During Therapy: Flat affect Overall Cognitive Status: Within Functional Limits for tasks assessed                                 General Comments: flat affect and required encouragement to participate, as pt initally refusing  Exercises      General Comments General comments (skin integrity, edema, etc.): requesting pain meds - RN notified      Pertinent Vitals/Pain Pain Assessment Pain Assessment: 0-10 Pain Score:  (12/10) Pain Location: back Pain Descriptors / Indicators: Aching, Discomfort, Grimacing, Guarding, Moaning Pain Intervention(s): Limited activity within patient's tolerance, Monitored during session, Repositioned, Patient requesting pain meds-RN notified    Home Living                          Prior Function             PT Goals (current goals can now be found in the care plan section) Acute Rehab PT Goals Patient Stated Goal: to go home PT Goal Formulation: With patient Time For Goal Achievement: 12/20/22 Potential to Achieve Goals: Fair    Frequency    Min 5X/week      PT Plan Current plan remains appropriate    Co-evaluation              AM-PAC PT "6 Clicks" Mobility   Outcome Measure  Help needed turning from your back to your side while in a flat bed without using bedrails?: A Little Help needed moving from lying on your back to sitting on the side of a flat bed without using bedrails?: A Lot Help needed moving to and from a bed to a chair (including a wheelchair)?: A Lot Help needed standing up from a chair using your arms (e.g., wheelchair or bedside chair)?: A Lot Help needed to walk in hospital room?: A Lot Help needed climbing 3-5 steps with a railing? : A Lot 6 Click Score: 13    End of Session   Activity Tolerance: Patient limited by fatigue;Patient limited by pain Patient left: with call bell/phone within reach;in bed;with bed alarm set Nurse Communication: Mobility status PT Visit Diagnosis: Muscle weakness (generalized) (M62.81);Pain;Difficulty in walking, not elsewhere classified (R26.2) Pain - Right/Left:  (back) Pain - part of body:  (back)     Time: 0981-1914 PT Time Calculation (min) (ACUTE ONLY): 13 min  Charges:  $Therapeutic Activity: 8-22 mins                    Hunter Reyes M Hunter Reyes Hunter Reyes PT, DPT  12/07/2022, 8:40 AM

## 2022-12-08 DIAGNOSIS — M71071 Abscess of bursa, right ankle and foot: Secondary | ICD-10-CM

## 2022-12-08 DIAGNOSIS — B181 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: Secondary | ICD-10-CM | POA: Diagnosis not present

## 2022-12-08 DIAGNOSIS — R7881 Bacteremia: Secondary | ICD-10-CM | POA: Diagnosis not present

## 2022-12-08 DIAGNOSIS — F319 Bipolar disorder, unspecified: Secondary | ICD-10-CM | POA: Diagnosis not present

## 2022-12-08 DIAGNOSIS — G834 Cauda equina syndrome: Secondary | ICD-10-CM

## 2022-12-08 DIAGNOSIS — N179 Acute kidney failure, unspecified: Secondary | ICD-10-CM | POA: Diagnosis not present

## 2022-12-08 DIAGNOSIS — G062 Extradural and subdural abscess, unspecified: Secondary | ICD-10-CM

## 2022-12-08 LAB — CBC
HCT: 30.7 % — ABNORMAL LOW (ref 39.0–52.0)
Hemoglobin: 10.2 g/dL — ABNORMAL LOW (ref 13.0–17.0)
MCH: 29.1 pg (ref 26.0–34.0)
MCHC: 33.2 g/dL (ref 30.0–36.0)
MCV: 87.7 fL (ref 80.0–100.0)
Platelets: 283 10*3/uL (ref 150–400)
RBC: 3.5 MIL/uL — ABNORMAL LOW (ref 4.22–5.81)
RDW: 14.6 % (ref 11.5–15.5)
WBC: 22.5 10*3/uL — ABNORMAL HIGH (ref 4.0–10.5)
nRBC: 0 % (ref 0.0–0.2)

## 2022-12-08 LAB — RENAL FUNCTION PANEL
Albumin: 2 g/dL — ABNORMAL LOW (ref 3.5–5.0)
Anion gap: 9 (ref 5–15)
BUN: 40 mg/dL — ABNORMAL HIGH (ref 6–20)
CO2: 23 mmol/L (ref 22–32)
Calcium: 8 mg/dL — ABNORMAL LOW (ref 8.9–10.3)
Chloride: 107 mmol/L (ref 98–111)
Creatinine, Ser: 1.45 mg/dL — ABNORMAL HIGH (ref 0.61–1.24)
GFR, Estimated: >60 mL/min (ref >60)
Glucose, Bld: 92 mg/dL (ref 70–99)
Phosphorus: 4 mg/dL (ref 2.5–4.6)
Potassium: 3.4 mmol/L — ABNORMAL LOW (ref 3.5–5.1)
Sodium: 139 mmol/L (ref 135–145)

## 2022-12-08 LAB — CULTURE, BLOOD (ROUTINE X 2)
Special Requests: ADEQUATE
Special Requests: ADEQUATE
Special Requests: ADEQUATE
Special Requests: ADEQUATE

## 2022-12-08 MED ORDER — POTASSIUM CHLORIDE CRYS ER 20 MEQ PO TBCR
40.0000 meq | EXTENDED_RELEASE_TABLET | Freq: Once | ORAL | Status: AC
  Administered 2022-12-08: 40 meq via ORAL
  Filled 2022-12-08: qty 2

## 2022-12-08 MED ORDER — POLYETHYLENE GLYCOL 3350 17 G PO PACK
17.0000 g | PACK | Freq: Every day | ORAL | Status: DC
  Administered 2022-12-11 – 2023-02-01 (×28): 17 g via ORAL
  Filled 2022-12-08 (×44): qty 1

## 2022-12-08 NOTE — Progress Notes (Signed)
Blood cultures from 12/13 now positive with Staph aureus and CoN STaph.  On daptomycin and no changes in the plan.   Thayer Headings, MD

## 2022-12-08 NOTE — Evaluation (Signed)
Occupational Therapy Evaluation Patient Details Name: Hunter Reyes MRN: [id number] DOB: Mar 27, 1976 Today's Date: 12/08/2022   History of Present Illness 46 y.o. male admitted 12/10 with AMS, urinary retention and low back pain. Underwent L4 lumbar laminectomy for epidural abscess 12/13. PMH:  bipolar 1 disorder, borderline personality disorder, COPD, crack cocaine abuse, depression, peripheral neuropathy, history of right foot osteomyelitis, panic attack and history of seizures   Clinical Impression   PTA, pt reports he was independent and lived with sister and sister in law; some inconsistencies between chart and pt report. Pt unaware he had spinal surgery, and thus, unaware of precautions. Able to recall 1/3 on command at end of session, but requiring mod cues to recall 3/3. Requiring increased time for processing to follow commands and verbal cues for precautions throughout. Performing LB ADL with mod A this session. Pending progress and available assist at home, recommending HHOT to optimize safety and independence in ADL and IADL.     Recommendations for follow up therapy are one component of a multi-disciplinary discharge planning process, led by the attending physician.  Recommendations may be updated based on patient status, additional functional criteria and insurance authorization.   Follow Up Recommendations  Home health OT (pending progress or available assist at home.)     Assistance Recommended at Discharge Intermittent Supervision/Assistance  Patient can return home with the following A little help with walking and/or transfers;A little help with bathing/dressing/bathroom;Assistance with cooking/housework;Direct supervision/assist for medications management;Direct supervision/assist for financial management;Assist for transportation;Help with stairs or ramp for entrance    Functional Status Assessment  Patient has had a recent decline in their functional status and  demonstrates the ability to make significant improvements in function in a reasonable and predictable amount of time.  Equipment Recommendations  BSC/3in1 (if pt does not already have; potentially poor historian)    Recommendations for Other Services       Precautions / Restrictions Precautions Precautions: Fall;Back Precaution Booklet Issued:  (reviewed during ADL. Pt able to recall 1/3 precautions following session. Pt needs continued education. Pt was also unaware he had surgery) Precaution Comments: watch HR, incontinent stool Restrictions Weight Bearing Restrictions: No      Mobility Bed Mobility Overal bed mobility: Needs Assistance Bed Mobility: Rolling, Sidelying to Sit, Sit to Sidelying Rolling: Min guard Sidelying to sit: Min assist     Sit to sidelying: Min guard General bed mobility comments: cues for sequence with assist to elevate trunk from surface    Transfers Overall transfer level: Needs assistance Equipment used: Rolling walker (2 wheels) Transfers: Sit to/from Stand Sit to Stand: Min assist, Mod assist           General transfer comment: Min A to stand from bed      Balance Overall balance assessment: Needs assistance Sitting-balance support: Feet supported, No upper extremity supported Sitting balance-[name] Scale: Poor Sitting balance - Comments: can static sit without support but needs support of atr least one UE after achieving figure 4 position.   Standing balance support: Bilateral upper extremity supported, Reliant on assistive device for balance Standing balance-[name] Scale: Poor Standing balance comment: bil Ue support in standing                           ADL either performed or assessed with clinical judgement   ADL Overall ADL's : Needs assistance/impaired Eating/Feeding: Modified independent;Bed level   Grooming: Supervision/safety;Sitting Grooming Details (indicate cue type and reason): supervision for  safety  sitting EOB; decr sititng balance Upper Body Bathing: Supervision/ safety;Set up;Sitting   Lower Body Bathing: Minimal assistance;Sitting/lateral leans   Upper Body Dressing : Supervision/safety;Sitting   Lower Body Dressing: Moderate assistance;Sit to/from stand Lower Body Dressing Details (indicate cue type and reason): assist to achieve figure 4 and with decreased sitting balance in this position, requiring support of at least one UE; OT to assist with other side of sock while pt attempting to don with one hand Toilet Transfer: Minimal assistance;Rolling walker (2 wheels);Ambulation Toilet Transfer Details (indicate cue type and reason): Min A for balance and mod-max cues for upright posture         Functional mobility during ADLs: Minimal assistance;Rolling walker (2 wheels) General ADL Comments: Limited by pain and cognition.     Vision Baseline Vision/History: 0 No visual deficits Ability to See in Adequate Light: 0 Adequate Patient Visual Report: No change from baseline Vision Assessment?: No apparent visual deficits     Perception Perception Perception Tested?: No   Praxis Praxis Praxis tested?: Not tested    Pertinent Vitals/Pain Pain Assessment Pain Assessment: Faces Faces Pain Scale: Hurts even more Pain Location: back Pain Descriptors / Indicators: Aching, Guarding Pain Intervention(s): Limited activity within patient's tolerance, Monitored during session, Repositioned     Hand Dominance     Extremity/Trunk Assessment Upper Extremity Assessment Upper Extremity Assessment: Generalized weakness (difficulty opening milk, sanitary hand towel, etc)   Lower Extremity Assessment Lower Extremity Assessment: Defer to PT evaluation   Cervical / Trunk Assessment Cervical / Trunk Assessment: Back Surgery   Communication Communication Communication: Other (comment) (slurred speech)   Cognition Arousal/Alertness: Awake/alert Behavior During Therapy: Flat  affect Overall Cognitive Status: Impaired/Different from baseline Area of Impairment: Problem solving, Memory, Safety/judgement, Awareness, Orientation, Attention                 Orientation Level: Time, Disoriented to, Situation (day; unaware he has had spinal procedure) Current Attention Level: Sustained (internally distracted and distracted by minimal and distant background noise) Memory: Decreased recall of precautions, Decreased short-term memory   Safety/Judgement: Decreased awareness of safety, Decreased awareness of deficits (cues for hand placement) Awareness: Intellectual, Emergent Problem Solving: Slow processing, Decreased initiation General Comments: incontinent of stool which he reports is new, slow processing. Difficulty recalling spinal precautions despite max education during functional mobility. Continues with flat affect overall, but pt did make 2+ attempts to make therapist laugh     General Comments  HR up to 130 with functional mobility. Pt observed to pick at scab on medial aspect of metatatarsalphalangeal joint and foot bled after stading; RN notified and coming to place bandage    Exercises     Shoulder Instructions      Home Living Family/patient expects to be discharged to:: Private residence Living Arrangements:  (Pher chart, alone; pt reports with sister in law and sister)   Type of Home: House Home Access: Stairs to enter Technical brewer of Steps: 2 Entrance Stairs-Rails: Right;Left Home Layout: One level     Bathroom Shower/Tub: Aeronautical engineer: Wheelchair - manual          Prior Functioning/Environment Prior Level of Function : Independent/Modified Independent             Mobility Comments: Pt reports no AD ADLs Comments: Pt reports indpendent in ADL, IADL, and driving        OT Problem List: Decreased strength;Decreased activity tolerance;Impaired balance (sitting and/or  standing);Decreased  cognition;Decreased safety awareness;Decreased knowledge of use of DME or AE;Decreased knowledge of precautions;Pain;Impaired UE functional use      OT Treatment/Interventions: Self-care/ADL training;Therapeutic exercise;DME and/or AE instruction;Therapeutic activities;Cognitive remediation/compensation;Patient/family education;Balance training    OT Goals(Current goals can be found in the care plan section) Acute Rehab OT Goals Patient Stated Goal: get better OT Goal Formulation: With patient Time For Goal Achievement: 12/22/22 Potential to Achieve Goals: Good  OT Frequency: Min 2X/week    Co-evaluation              AM-PAC OT "6 Clicks" Daily Activity     Outcome Measure Help from another person eating meals?: None Help from another person taking care of personal grooming?: A Little Help from another person toileting, which includes using toliet, bedpan, or urinal?: A Little Help from another person bathing (including washing, rinsing, drying)?: A Little Help from another person to put on and taking off regular upper body clothing?: A Little Help from another person to put on and taking off regular lower body clothing?: A Lot 6 Click Score: 18   End of Session Equipment Utilized During Treatment: Gait belt;Rolling walker (2 wheels) Nurse Communication: Mobility status;Other (comment) (bleeding on R foot)  Activity Tolerance: Patient limited by fatigue Patient left: in bed;with call bell/phone within reach;with bed alarm set  OT Visit Diagnosis: Unsteadiness on feet (R26.81);Muscle weakness (generalized) (M62.81);Pain;Other symptoms and signs involving cognitive function;Other abnormalities of gait and mobility (R26.89)                Time: 6979-4801 OT Time Calculation (min): 34 min Charges:  OT General Charges $OT Visit: 1 Visit OT Evaluation $OT Eval Moderate Complexity: 1 Mod OT Treatments $Self Care/Home Management : 8-22 mins  Elder Cyphers, OTR/L [facility] Acute  Rehabilitation Office: [phone]   Magnus Ivan 12/08/2022, 4:29 PM

## 2022-12-08 NOTE — Progress Notes (Signed)
PROGRESS NOTE    Hunter Reyes  TMH:[id number] DOB: 1976/08/08 DOA: 12/02/2022 PCP: Celene Squibb, MD     Chief Complaint  Patient presents with   Urinary Retention   Back Pain    Brief Narrative:  Hunter Reyes is a 46 y.o. Caucasian male with medical history significant for bipolar 1 disorder, borderline personality disorder, COPD, crack cocaine abuse, depression, peripheral neuropathy, history of right foot osteomyelitis, panic attack and history of seizures, who presented to the emergency room with acute onset of altered mental status with associated urinary retention and low back pain.  The patient has not urinated for 3 days.  Hlabs hyperkalemia 5.8, BUN more than 300 and creatinine 20.99 with calcium of 8.8 anion gap of 26, albumin of 2.7 and total protein of 8.9.  Serum lipase was 222.  Lactic acid was 1.2 and CK 40.  CBC showed leukocytosis of 32.2 with neutrophilia and thrombocytosis of 411 with mild anemia with hemoglobin of 12.8 hematocrit 27.1.  Previous BUN and creatinine were 8 and 0.95 however on 09/28/2017.  Assessment & Plan:   Principal Problem:   MRSA bacteremia Active Problems:   AKI (acute kidney injury) ([place])   Diskitis   Bipolar 1 disorder ([place])   Peripheral neuropathy   Polysubstance abuse (HCC)   Metabolic acidosis   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 (HCC)    AKI (acute kidney injury) ([place]) Urinary retention - his is likely prerenal due to severe dehydration as well as obstructive uropathy and the use of nephrotoxic agents (NSAIDs). -At time of admission BUN about 300 and creatinine more than 20. -After Foley catheter placed and fluid resuscitation provided  -Function continues to improve, creatinine is 1.45 today, -Continue to monitor electrolyte closely for postobstructive diuresis - continue with flomax  Hyperkalemia  - Due to AKI, resolved with Lokelma  Hypernatremia Hyperchloremia Metabolic acidosis  -This has resolved  with bicarb W, will lower rate, to continue for next 24 hours as bicarb level still at 19.  Diskitis/MRSA bacteremia/epidural abscess -Discitis as evident on imaging -Patient appears with lower extremity weakness, urinary retention, possible incontinence, unclear acuity, as report last time he was able to stand up was a month ago -Management per ID, vancomycin has been switched to daptomycin -  sp Right L4 laminotomy, sublaminar decompression with debridement of ventral epidural abscess  by Dr. Kathyrn Sheriff  -Concern of both feet having osteomyelitis, could be the source, MRI is pending. -TEE with no vegetation, at this point unclear if he will need TEE or not as likely will need to finish at least 6 weeks of IV antibiotics.  Right foot abscess/to myelitis/tenosynovitis -Dr. Sharol Given consulted, to take 2 OR tomorrow   Peripheral neuropathy -Continue Neurontin and adjusted dose   Bipolar 1 disorder ([place]) -No suicidal ideation or hallucination -Stable mood currently -Continue Abilify-antidepressant therapy.   Polysubstance abuse ([place]) -This includes cocaine and likely opiates. -Continue the use of Suboxone   Bilateral heel ulcers POA  - cont with wound care, MRI of both feet tomorrow.    Hypomagnesemia Hypophosphatemia -Repleted  Hepatitis B infection -ID is following    DVT prophylaxis: Lovenox Code Status: Full Family Communication: None at bedside Disposition:   Status is: Inpatient    Consultants:  ID Neurosurgery Orthopedic Dr. Sharol Given  Subjective:  Significant events overnight, he denies any bowel movement over the last 48 hours, requesting some laxatives. Objective: Vitals:   12/08/22 0400 12/08/22 0700 12/08/22 0800 12/08/22 1232  BP: 127/79  122/62   Pulse: 98 92 100 (!) 150  Resp: 14 15 14    Temp: 97.8 F (36.6 C)  98.7 F (37.1 C)   TempSrc: Oral  Oral   SpO2: 92% 91% 95%   Weight:      Height:        Intake/Output Summary (Last 24 hours) at  12/08/2022 1252 Last data filed at 12/08/2022 8115 Gross per 24 hour  Intake 3792.19 ml  Output 2250 ml  Net 1542.19 ml   Filed Weights   12/02/22 2230  Weight: (!) 149 kg    Examination:   Awake Alert, he is more appropriate, coherent today, with intact judgment and insight Symmetrical Chest wall movement, Good air movement bilaterally, CTAB RRR,No Gallops,Rubs or new Murmurs, No Parasternal Heave +ve B.Sounds, Abd Soft, No tenderness, No rebound - guarding or rigidity. No Cyanosis, Clubbing or edema, No new Rash or bruise       Data Reviewed: I have personally reviewed following labs and imaging studies  CBC: Recent Labs  Lab 12/03/22 0007 12/03/22 0500 12/06/22 0451 12/07/22 0311 12/08/22 0224  WBC 32.2* 33.8* 34.3* 25.1* 22.5*  NEUTROABS 27.0*  --   --   --   --   HGB 12.8* 13.1 11.1* 10.4* 10.2*  HCT 37.1* 38.4* 33.4* 32.1* 30.7*  MCV 82.8 84.2 88.1 88.7 87.7  PLT 411* 446* 321 289 726    Basic Metabolic Panel: Recent Labs  Lab 12/04/22 1007 12/05/22 0524 12/06/22 0451 12/06/22 1509 12/07/22 0314 12/08/22 0255  NA 148* 149* 148* 145 140 139  K 5.1 5.3* 5.4* 3.6 3.8 3.4*  CL 122* 125* 124* 119* 111 107  CO2 15* 15* 15* 18* 19* 23  GLUCOSE 110* 110* 125* 139* 96 92  BUN 118* 89* 61* 53* 45* 40*  CREATININE 3.41* 1.88* 1.48* 1.20 1.30* 1.45*  CALCIUM 8.9 8.8* 8.3* 8.2* 7.9* 8.0*  PHOS 5.7* 4.7* 4.6  --  3.6 4.0    GFR: Estimated Creatinine Clearance: 99.1 mL/min (A) (by C-G formula based on SCr of 1.45 mg/dL (H)).  Liver Function Tests: Recent Labs  Lab 12/03/22 0007 12/04/22 1007 12/05/22 0524 12/06/22 0451 12/07/22 0314 12/08/22 0255  AST 17  --   --   --   --   --   ALT 15  --   --   --   --   --   ALKPHOS 99  --   --   --   --   --   BILITOT 1.0  --   --   --   --   --   PROT 8.9*  --   --   --   --   --   ALBUMIN 2.7* 2.4* 2.3* 2.2* 2.0* 2.0*    CBG: Recent Labs  Lab 12/06/22 2103  GLUCAP 110*     Recent Results (from  the past 240 hour(s))  Resp Panel by RT-PCR (Flu A&B, Covid) Anterior Nasal Swab     Status: None   Collection Time: 12/02/22 11:30 PM   Specimen: Anterior Nasal Swab  Result Value Ref Range Status   SARS Coronavirus 2 by RT PCR NEGATIVE NEGATIVE Final    Comment: (NOTE) SARS-CoV-2 target nucleic acids are NOT DETECTED.  The SARS-CoV-2 RNA is generally detectable in upper respiratory specimens during the acute phase of infection. The lowest concentration of SARS-CoV-2 viral copies this assay can detect is 138 copies/mL. A negative result does not preclude SARS-Cov-2 infection and should not be  used as the sole basis for treatment or other patient management decisions. A negative result may occur with  improper specimen collection/handling, submission of specimen other than nasopharyngeal swab, presence of viral mutation(s) within the areas targeted by this assay, and inadequate number of viral copies(<138 copies/mL). A negative result must be combined with clinical observations, patient history, and epidemiological information. The expected result is Negative.  Fact Sheet for Patients:  EntrepreneurPulse.com.au  Fact Sheet for Healthcare Providers:  IncredibleEmployment.be  This test is no t yet approved or cleared by the [place] FDA and  has been authorized for detection and/or diagnosis of SARS-CoV-2 by FDA under an Emergency Use Authorization (EUA). This EUA will remain  in effect (meaning this test can be used) for the duration of the COVID-19 declaration under Section 564(b)(1) of the Act, 21 U.S.C.section 360bbb-3(b)(1), unless the authorization is terminated  or revoked sooner.       Influenza A by PCR NEGATIVE NEGATIVE Final   Influenza B by PCR NEGATIVE NEGATIVE Final    Comment: (NOTE) The Xpert Xpress SARS-CoV-2/FLU/RSV plus assay is intended as an aid in the diagnosis of influenza from Nasopharyngeal swab specimens  and should not be used as a sole basis for treatment. Nasal washings and aspirates are unacceptable for Xpert Xpress SARS-CoV-2/FLU/RSV testing.  Fact Sheet for Patients: EntrepreneurPulse.com.au  Fact Sheet for Healthcare Providers: IncredibleEmployment.be  This test is not yet approved or cleared by the [place] FDA and has been authorized for detection and/or diagnosis of SARS-CoV-2 by FDA under an Emergency Use Authorization (EUA). This EUA will remain in effect (meaning this test can be used) for the duration of the COVID-19 declaration under Section 564(b)(1) of the Act, 21 U.S.C. section 360bbb-3(b)(1), unless the authorization is terminated or revoked.  Performed at [facility], [address], [place], [place] [address]   Blood culture (routine x 2)     Status: Abnormal   Collection Time: 12/03/22  4:00 AM   Specimen: Site Not Specified; Blood  Result Value Ref Range Status   Specimen Description   Final    SITE NOT SPECIFIED BOTTLES DRAWN AEROBIC AND ANAEROBIC Performed at [facility], [address], [place], [place] [address]    Special Requests   Final    Blood Culture adequate volume Performed at [place] [place], [place] [address]    Culture  Setup Time   Final    GRAM POSITIVE COCCI AEB BOTTLES DRAWN AEROBIC ONLY Gram Stain Report Called to,Read Back By and Verified With: REECE THOMPSON,RN @2143  ON 12/04/22 BY SSLANE CRITICAL RESULT CALLED TO, READ BACK BY AND VERIFIED WITH: G ABBOTT,PHARMD@0200  12/05/22 [place] GRAM POSITIVE COCCI ANAEROBIC BOTTLE ONLY RESULT PREVIOUSLY CALLED Performed at [facility], [address] [place],  [address]    Culture METHICILLIN RESISTANT STAPHYLOCOCCUS AUREUS (A)  Final   Report Status 12/08/2022 FINAL  Final   Organism ID, Bacteria METHICILLIN RESISTANT STAPHYLOCOCCUS AUREUS  Final      Susceptibility   Methicillin resistant staphylococcus aureus - MIC*     CIPROFLOXACIN >=8 RESISTANT Resistant     ERYTHROMYCIN >=8 RESISTANT Resistant     GENTAMICIN <=0.5 SENSITIVE Sensitive     OXACILLIN >=4 RESISTANT Resistant     TETRACYCLINE <=1 SENSITIVE Sensitive     VANCOMYCIN <=0.5 SENSITIVE Sensitive     TRIMETH/SULFA 20 SENSITIVE Sensitive     CLINDAMYCIN <=0.25 SENSITIVE Sensitive     RIFAMPIN <=0.5 SENSITIVE Sensitive     Inducible Clindamycin NEGATIVE  Sensitive     * METHICILLIN RESISTANT STAPHYLOCOCCUS AUREUS  Blood Culture ID Panel (Reflexed)     Status: Abnormal   Collection Time: 12/03/22  4:00 AM  Result Value Ref Range Status   Enterococcus faecalis NOT DETECTED NOT DETECTED Final   Enterococcus Faecium NOT DETECTED NOT DETECTED Final   Listeria monocytogenes NOT DETECTED NOT DETECTED Final   Staphylococcus species DETECTED (A) NOT DETECTED Final    Comment: CRITICAL RESULT CALLED TO, READ BACK BY AND VERIFIED WITH: G ABBOTT,PHARMD@0200  12/05/22 [place]    Staphylococcus aureus (BCID) DETECTED (A) NOT DETECTED Final    Comment: Methicillin (oxacillin)-resistant Staphylococcus aureus (MRSA). MRSA is predictably resistant to beta-lactam antibiotics (except ceftaroline). Preferred therapy is vancomycin unless clinically contraindicated. Patient requires contact precautions if  hospitalized. CRITICAL RESULT CALLED TO, READ BACK BY AND VERIFIED WITH: G ABBOTT,PHARMD@0200  12/05/22 [place]    Staphylococcus epidermidis NOT DETECTED NOT DETECTED Final   Staphylococcus lugdunensis NOT DETECTED NOT DETECTED Final   Streptococcus species NOT DETECTED NOT DETECTED Final   Streptococcus agalactiae NOT DETECTED NOT DETECTED Final   Streptococcus pneumoniae NOT DETECTED NOT DETECTED Final   Streptococcus pyogenes NOT DETECTED NOT DETECTED Final   A.calcoaceticus-baumannii NOT DETECTED NOT DETECTED Final   Bacteroides fragilis NOT DETECTED NOT DETECTED Final   Enterobacterales NOT DETECTED NOT DETECTED Final   Enterobacter cloacae complex NOT DETECTED NOT  DETECTED Final   Escherichia coli NOT DETECTED NOT DETECTED Final   Klebsiella aerogenes NOT DETECTED NOT DETECTED Final   Klebsiella oxytoca NOT DETECTED NOT DETECTED Final   Klebsiella pneumoniae NOT DETECTED NOT DETECTED Final   Proteus species NOT DETECTED NOT DETECTED Final   Salmonella species NOT DETECTED NOT DETECTED Final   Serratia marcescens NOT DETECTED NOT DETECTED Final   Haemophilus influenzae NOT DETECTED NOT DETECTED Final   Neisseria meningitidis NOT DETECTED NOT DETECTED Final   Pseudomonas aeruginosa NOT DETECTED NOT DETECTED Final   Stenotrophomonas maltophilia NOT DETECTED NOT DETECTED Final   Candida albicans NOT DETECTED NOT DETECTED Final   Candida auris NOT DETECTED NOT DETECTED Final   Candida glabrata NOT DETECTED NOT DETECTED Final   Candida krusei NOT DETECTED NOT DETECTED Final   Candida parapsilosis NOT DETECTED NOT DETECTED Final   Candida tropicalis NOT DETECTED NOT DETECTED Final   Cryptococcus neoformans/gattii NOT DETECTED NOT DETECTED Final   Meth resistant mecA/C and MREJ DETECTED (A) NOT DETECTED Final    Comment: CRITICAL RESULT CALLED TO, READ BACK BY AND VERIFIED WITH: G ABBOTT,PHARMD@0200  12/05/22 [place] Performed at [facility], [address], [place], [place] [address]   Urine Culture     Status: Abnormal   Collection Time: 12/03/22  4:01 AM   Specimen: Urine, Catheterized  Result Value Ref Range Status   Specimen Description   Final    URINE, CATHETERIZED Performed at [facility], [address], [place], [place] [address]    Special Requests   Final    NONE Performed at [facility], [address], [place], [place] [address]    Culture (A)  Final    20,000 COLONIES/mL METHICILLIN RESISTANT STAPHYLOCOCCUS AUREUS   Report Status 12/05/2022 FINAL  Final   Organism ID, Bacteria METHICILLIN RESISTANT STAPHYLOCOCCUS AUREUS (A)  Final      Susceptibility   Methicillin resistant staphylococcus aureus - MIC*    CIPROFLOXACIN >=8  RESISTANT Resistant     GENTAMICIN <=0.5 SENSITIVE Sensitive     NITROFURANTOIN 32 SENSITIVE Sensitive     OXACILLIN >=4 RESISTANT Resistant  TETRACYCLINE <=1 SENSITIVE Sensitive     VANCOMYCIN <=0.5 SENSITIVE Sensitive     TRIMETH/SULFA <=10 SENSITIVE Sensitive     CLINDAMYCIN <=0.25 SENSITIVE Sensitive     RIFAMPIN <=0.5 SENSITIVE Sensitive     Inducible Clindamycin NEGATIVE Sensitive     * 20,000 COLONIES/mL METHICILLIN RESISTANT STAPHYLOCOCCUS AUREUS  Blood culture (routine x 2)     Status: Abnormal   Collection Time: 12/03/22  4:10 AM   Specimen: BLOOD  Result Value Ref Range Status   Specimen Description BLOOD SITE NOT SPECIFIED  Final   Special Requests   Final    BOTTLES DRAWN AEROBIC AND ANAEROBIC Blood Culture adequate volume   Culture  Setup Time   Final    CRITICAL RESULT CALLED TO, READ BACK BY AND VERIFIED WITH: IN BOTH AEROBIC AND ANAEROBIC BOTTLES GRAM POSITIVE COCCI Gram Stain Report Called to,Read Back By and Verified With: NAKIA MCGOWAN @ 8938 ON 12/05/22 [place] CRITICAL RESULT CALLED TO, READ BACK BY AND VERIFIED WITH: RN Minna Merritts [facility] ON 12/05/22 @ 1950 BY DRT    Culture (A)  Final    STAPHYLOCOCCUS AUREUS SUSCEPTIBILITIES PERFORMED ON PREVIOUS CULTURE WITHIN THE LAST 5 DAYS. Performed at [facility], [place] [address], [place], [place] [address]    Report Status 12/08/2022 FINAL  Final  Blood Culture ID Panel (Reflexed)     Status: Abnormal   Collection Time: 12/03/22  4:10 AM  Result Value Ref Range Status   Enterococcus faecalis NOT DETECTED NOT DETECTED Final   Enterococcus Faecium NOT DETECTED NOT DETECTED Final   Listeria monocytogenes NOT DETECTED NOT DETECTED Final   Staphylococcus species DETECTED (A) NOT DETECTED Final    Comment: CRITICAL RESULT CALLED TO, READ BACK BY AND VERIFIED WITH: RN Minna Merritts [facility] ON 12/05/22 @ 1950 BY DRT    Staphylococcus aureus (BCID) DETECTED (A) NOT DETECTED Final    Comment: Methicillin  (oxacillin)-resistant Staphylococcus aureus (MRSA). MRSA is predictably resistant to beta-lactam antibiotics (except ceftaroline). Preferred therapy is vancomycin unless clinically contraindicated. Patient requires contact precautions if  hospitalized. CRITICAL RESULT CALLED TO, READ BACK BY AND VERIFIED WITH: RN Minna Merritts [facility] ON 12/05/22 @ 1950 BY DRT    Staphylococcus epidermidis NOT DETECTED NOT DETECTED Final   Staphylococcus lugdunensis NOT DETECTED NOT DETECTED Final   Streptococcus species NOT DETECTED NOT DETECTED Final   Streptococcus agalactiae NOT DETECTED NOT DETECTED Final   Streptococcus pneumoniae NOT DETECTED NOT DETECTED Final   Streptococcus pyogenes NOT DETECTED NOT DETECTED Final   A.calcoaceticus-baumannii NOT DETECTED NOT DETECTED Final   Bacteroides fragilis NOT DETECTED NOT DETECTED Final   Enterobacterales NOT DETECTED NOT DETECTED Final   Enterobacter cloacae complex NOT DETECTED NOT DETECTED Final   Escherichia coli NOT DETECTED NOT DETECTED Final   Klebsiella aerogenes NOT DETECTED NOT DETECTED Final   Klebsiella oxytoca NOT DETECTED NOT DETECTED Final   Klebsiella pneumoniae NOT DETECTED NOT DETECTED Final   Proteus species NOT DETECTED NOT DETECTED Final   Salmonella species NOT DETECTED NOT DETECTED Final   Serratia marcescens NOT DETECTED NOT DETECTED Final   Haemophilus influenzae NOT DETECTED NOT DETECTED Final   Neisseria meningitidis NOT DETECTED NOT DETECTED Final   Pseudomonas aeruginosa NOT DETECTED NOT DETECTED Final   Stenotrophomonas maltophilia NOT DETECTED NOT DETECTED Final   Candida albicans NOT DETECTED NOT DETECTED Final   Candida auris NOT DETECTED NOT DETECTED Final   Candida glabrata NOT DETECTED NOT DETECTED Final   Candida krusei NOT DETECTED NOT DETECTED Final  Candida parapsilosis NOT DETECTED NOT DETECTED Final   Candida tropicalis NOT DETECTED NOT DETECTED Final   Cryptococcus neoformans/gattii NOT DETECTED NOT DETECTED Final    Meth resistant mecA/C and MREJ DETECTED (A) NOT DETECTED Final    Comment: CRITICAL RESULT CALLED TO, READ BACK BY AND VERIFIED WITH: RN Demetrio Lapping ON 12/05/22 @ 1950 BY DRT Performed at [facility], [place] [address] [place], [place] [address]   Culture, blood (Routine X 2) w Reflex to ID Panel     Status: Abnormal   Collection Time: 12/05/22  9:12 AM   Specimen: BLOOD  Result Value Ref Range Status   Specimen Description BLOOD LEFT ANTECUBITAL  Final   Special Requests   Final    BOTTLES DRAWN AEROBIC AND ANAEROBIC Blood Culture adequate volume   Culture  Setup Time   Final    GRAM POSITIVE COCCI IN CLUSTERS IN BOTH AEROBIC AND ANAEROBIC BOTTLES CRITICAL RESULT CALLED TO, READ BACK BY AND VERIFIED WITH: [place], AT 1334 12/06/22 D. [place]    Culture (A)  Final    STAPHYLOCOCCUS SPECIES (COAGULASE NEGATIVE) STAPHYLOCOCCUS AUREUS SUSCEPTIBILITIES PERFORMED ON PREVIOUS CULTURE WITHIN THE LAST 5 DAYS. Performed at [facility], [place] [address], [place], [place] [address]    Report Status 12/08/2022 FINAL  Final   Organism ID, Bacteria STAPHYLOCOCCUS SPECIES (COAGULASE NEGATIVE)  Final      Susceptibility   Staphylococcus species (coagulase negative) - MIC*    CIPROFLOXACIN >=8 RESISTANT Resistant     ERYTHROMYCIN RESISTANT Resistant     GENTAMICIN <=0.5 SENSITIVE Sensitive     OXACILLIN >=4 RESISTANT Resistant     TETRACYCLINE <=1 SENSITIVE Sensitive     VANCOMYCIN <=0.5 SENSITIVE Sensitive     TRIMETH/SULFA <=10 SENSITIVE Sensitive     CLINDAMYCIN RESISTANT Resistant     RIFAMPIN <=0.5 SENSITIVE Sensitive     Inducible Clindamycin POSITIVE Resistant     * STAPHYLOCOCCUS SPECIES (COAGULASE NEGATIVE)  Culture, blood (Routine X 2) w Reflex to ID Panel     Status: Abnormal   Collection Time: 12/05/22  9:15 AM   Specimen: BLOOD LEFT WRIST  Result Value Ref Range Status   Specimen Description BLOOD LEFT WRIST  Final   Special Requests   Final     BOTTLES DRAWN AEROBIC AND ANAEROBIC Blood Culture adequate volume   Culture  Setup Time   Final    GRAM POSITIVE COCCI IN CLUSTERS AEROBIC BOTTLE ONLY CRITICAL VALUE NOTED.  VALUE IS CONSISTENT WITH PREVIOUSLY REPORTED AND CALLED VALUE.    Culture (A)  Final    STAPHYLOCOCCUS SPECIES (COAGULASE NEGATIVE) SUSCEPTIBILITIES PERFORMED ON PREVIOUS CULTURE WITHIN THE LAST 5 DAYS. Performed at [facility], [place] [address], [place], [place] [address]    Report Status 12/08/2022 FINAL  Final  Aerobic/Anaerobic Culture w Gram Stain (surgical/deep wound)     Status: None (Preliminary result)   Collection Time: 12/05/22  7:52 PM   Specimen: Abscess  Result Value Ref Range Status   Specimen Description ABSCESS  Final   Special Requests LUMBAR 4 EPIDURAL SPACE  Final   Gram Stain   Final    RARE WBC PRESENT, PREDOMINANTLY PMN NO ORGANISMS SEEN Performed at [facility], [place] [address] [place], [place] [address]    Culture   Final    RARE METHICILLIN RESISTANT STAPHYLOCOCCUS AUREUS NO ANAEROBES ISOLATED; CULTURE IN PROGRESS FOR 5 DAYS    Report Status PENDING  Incomplete   Organism  ID, Bacteria METHICILLIN RESISTANT STAPHYLOCOCCUS AUREUS  Final      Susceptibility   Methicillin resistant staphylococcus aureus - MIC*    CIPROFLOXACIN >=8 RESISTANT Resistant     ERYTHROMYCIN >=8 RESISTANT Resistant     GENTAMICIN <=0.5 SENSITIVE Sensitive     OXACILLIN >=4 RESISTANT Resistant     TETRACYCLINE <=1 SENSITIVE Sensitive     VANCOMYCIN <=0.5 SENSITIVE Sensitive     TRIMETH/SULFA <=10 SENSITIVE Sensitive     CLINDAMYCIN <=0.25 SENSITIVE Sensitive     RIFAMPIN <=0.5 SENSITIVE Sensitive     Inducible Clindamycin NEGATIVE Sensitive     * RARE METHICILLIN RESISTANT STAPHYLOCOCCUS AUREUS  Culture, blood (Routine X 2) w Reflex to ID Panel     Status: None (Preliminary result)   Collection Time: 12/06/22  3:09 PM   Specimen: BLOOD LEFT HAND  Result Value Ref Range Status   Specimen  Description BLOOD LEFT HAND  Final   Special Requests   Final    BOTTLES DRAWN AEROBIC ONLY Blood Culture adequate volume   Culture   Final    NO GROWTH 2 DAYS Performed at [facility], [address], [place], [place] [address]    Report Status PENDING  Incomplete  Culture, blood (Routine X 2) w Reflex to ID Panel     Status: None (Preliminary result)   Collection Time: 12/06/22  3:10 PM   Specimen: BLOOD RIGHT HAND  Result Value Ref Range Status   Specimen Description BLOOD RIGHT HAND  Final   Special Requests   Final    BOTTLES DRAWN AEROBIC AND ANAEROBIC Blood Culture adequate volume   Culture   Final    NO GROWTH 2 DAYS Performed at [facility], [place] [address], [place], [place] [address]    Report Status PENDING  Incomplete         Radiology Studies: MR FOOT RIGHT WO CONTRAST  Result Date: 12/07/2022 CLINICAL DATA:  Foot swelling, diabetic, osteomyelitis suspected. EXAM: MRI OF THE RIGHT FOREFOOT WITHOUT CONTRAST TECHNIQUE: Multiplanar, multisequence MR imaging of the right forefoot was performed. No intravenous contrast was administered. COMPARISON:  Radiographs dated September 28, 2017 FINDINGS: Bones/Joint/Cartilage Bone marrow edema of the fourth metatarsal shaft and head. Bone marrow edema of the head of the second and third metatarsals with surrounding inflammatory changes concerning for osteomyelitis. Hallux valgus deformity as well as hammertoe deformities of the digits. Ligaments Lisfranc ligament is intact. Evaluation of collateral ligaments of the foot is significantly limited. Soft tissues There is a fluid collection about the plantar aspect of the second and third metatarsal heads which extends to the dorsal aspect of the foot measuring approximately 4.4 x 2.2 x 2.3 cm consistent with abscess. There is soft tissue edema about this abscess. Muscles and Tendons There is marked edema about the plantar tendons with edema and inflammatory changes extends up to  the above-mentioned abscess, suggesting infectious tenosynovitis. IMPRESSION: 1. Fluid collection about the plantar aspect of the second and third metatarsal heads which extends to the dorsal aspect of the foot measuring approximately 4.4 x 2.2 x 2.3 cm consistent with abscess. 2. Bone marrow edema of the head of the second and third metatarsals with surrounding inflammatory changes concerning for osteomyelitis. 3. Bone marrow edema of the fourth metatarsal shaft and head concerning for osteomyelitis. 4. Marked edema about the plantar tendons with edema and inflammatory changes extending up to the above-mentioned abscess, suggesting infectious tenosynovitis. 5. Hallux valgus deformity as well as hammertoe deformities of  the digits. Electronically Signed   By: Keane Police D.O.   On: 12/07/2022 20:22   MR FOOT LEFT WO CONTRAST  Result Date: 12/06/2022 CLINICAL DATA:  Left foot swelling. EXAM: MRI OF THE LEFT FOOT WITHOUT CONTRAST TECHNIQUE: Multiplanar, multisequence MR imaging of the left forefoot was performed. No intravenous contrast was administered. COMPARISON:  Left foot x-rays dated June 20, 2016. MRI left foot dated February 20, 2016. FINDINGS: Bones/Joint/Cartilage No marrow signal abnormality. Prior partial amputation of the third toe. No acute fracture or dislocation. Chronic fractures of the first proximal phalanx head and distal second metatarsal. Mild degenerative changes of the midfoot, first and second MTP joints, and first IP joint. No joint effusion. Muscles and Tendons Flexor and extensor tendons are intact. No tenosynovitis. Near complete fatty atrophy of the intrinsic foot muscles. Soft tissue No fluid collection or hematoma. Unchanged small ganglion cyst near the base of the fifth metatarsal. IMPRESSION: 1. No osteomyelitis or abscess. Electronically Signed   By: Titus Dubin M.D.   On: 12/06/2022 18:56        Scheduled Meds:  buprenorphine-naloxone  1 tablet Sublingual BID    Chlorhexidine Gluconate Cloth  6 each Topical Q0600   enoxaparin (LOVENOX) injection  40 mg Subcutaneous Daily   gabapentin  600 mg Oral BID   LORazepam  1 mg Intravenous Once   sertraline  25 mg Oral Daily   sodium zirconium cyclosilicate  10 g Oral Once   Continuous Infusions:  DAPTOmycin (CUBICIN) 850 mg in sodium chloride 0.9 % IVPB Stopped (12/07/22 2040)   thiamine (VITAMIN B1) injection 500 mg (12/08/22 0949)     LOS: 5 days        Phillips Climes, MD [facility]   To contact the attending provider between 7A-7P or the covering provider during after hours 7P-7A, please log into the web site [email or web address] and access using universal [place] password for that web site. If you do not have the password, please call the hospital operator.  12/08/2022, 12:52 PM

## 2022-12-08 NOTE — Progress Notes (Signed)
Physical Therapy Treatment Patient Details Name: Hunter Reyes MRN: [id number] DOB: 11-20-1976 Today's Date: 12/08/2022   History of Present Illness 46 y.o. male admitted 12/10 with AMS, urinary retention and low back pain. Underwent L4 lumbar laminectomy for epidural abscess 12/13. PMH:  bipolar 1 disorder, borderline personality disorder, COPD, crack cocaine abuse, depression, peripheral neuropathy, history of right foot osteomyelitis, panic attack and history of seizures    PT Comments    Pt with ability to progress to limited gait this session. Pt limited by incontinent stool, pain and bil knee flexion in standing. Pt educated for back precautions for safety/ pain management as well as deficits in gait and need for assist with all mobility. Encouraged OOB during day and getting up for toileting.     Recommendations for follow up therapy are one component of a multi-disciplinary discharge planning process, led by the attending physician.  Recommendations may be updated based on patient status, additional functional criteria and insurance authorization.  Follow Up Recommendations  Home health PT     Assistance Recommended at Discharge Frequent or constant Supervision/Assistance  Patient can return home with the following Assistance with cooking/housework;Assist for transportation;Help with stairs or ramp for entrance;A lot of help with walking and/or transfers;A little help with bathing/dressing/bathroom   Equipment Recommendations  Rolling walker (2 wheels);BSC/3in1    Recommendations for Other Services       Precautions / Restrictions Precautions Precautions: Fall;Back Precaution Comments: watch HR, incontinent stool     Mobility  Bed Mobility Overal bed mobility: Needs Assistance Bed Mobility: Rolling, Sidelying to Sit Rolling: Min guard Sidelying to sit: Min assist       General bed mobility comments: cues for sequence with assist to elevate trunk from surface     Transfers Overall transfer level: Needs assistance   Transfers: Sit to/from Stand Sit to Stand: Min assist, Mod assist           General transfer comment: min assist to stand from bed, mod assist to stand from low toilet with rail    Ambulation/Gait Ambulation/Gait assistance: Min assist Gait Distance (Feet): 15 Feet Assistive device: Rolling walker (2 wheels) Gait Pattern/deviations: Step-through pattern, Decreased stride length, Trunk flexed       General Gait Details: pt with bil knee flexion maintained in standing with cues for posture, knee extension and safety as well as proximity to RW. pt walked 15' to bathroom and was incontinent of stool while stepping into bathroom. Return to chair after toileting, pericare and linen change   Stairs             Wheelchair Mobility    Modified Rankin (Stroke Patients Only)       Balance Overall balance assessment: Needs assistance Sitting-balance support: Feet supported, No upper extremity supported Sitting balance-[name] Scale: Fair     Standing balance support: Bilateral upper extremity supported, Reliant on assistive device for balance Standing balance-[name] Scale: Poor Standing balance comment: bil Ue support in standing                            Cognition Arousal/Alertness: Awake/alert Behavior During Therapy: Flat affect Overall Cognitive Status: Impaired/Different from baseline Area of Impairment: Problem solving                             Problem Solving: Slow processing, Decreased initiation General Comments: incontinent of stool which he reports is new,  slow processing        Exercises      General Comments        Pertinent Vitals/Pain Pain Assessment Pain Score: 4  Pain Location: back Pain Descriptors / Indicators: Aching, Guarding Pain Intervention(s): Limited activity within patient's tolerance, Monitored during session, Repositioned    Home Living                           Prior Function            PT Goals (current goals can now be found in the care plan section) Progress towards PT goals: Progressing toward goals    Frequency    Min 5X/week      PT Plan Current plan remains appropriate    Co-evaluation              AM-PAC PT "6 Clicks" Mobility   Outcome Measure  Help needed turning from your back to your side while in a flat bed without using bedrails?: A Little Help needed moving from lying on your back to sitting on the side of a flat bed without using bedrails?: A Little Help needed moving to and from a bed to a chair (including a wheelchair)?: A Lot Help needed standing up from a chair using your arms (e.g., wheelchair or bedside chair)?: A Lot Help needed to walk in hospital room?: A Little Help needed climbing 3-5 steps with a railing? : Total 6 Click Score: 14    End of Session   Activity Tolerance: Patient tolerated treatment well Patient left: in chair;with call bell/phone within reach Nurse Communication: Mobility status PT Visit Diagnosis: Muscle weakness (generalized) (M62.81);Pain;Difficulty in walking, not elsewhere classified (R26.2)     Time: 1916-6060 PT Time Calculation (min) (ACUTE ONLY): 35 min  Charges:  $Gait Training: 8-22 mins $Therapeutic Activity: 8-22 mins                     Bayard Males, PT Acute Rehabilitation Services Office: [place] 12/08/2022, 12:47 PM

## 2022-12-09 ENCOUNTER — Encounter (HOSPITAL_COMMUNITY): Payer: Self-pay | Admitting: Family Medicine

## 2022-12-09 ENCOUNTER — Inpatient Hospital Stay (HOSPITAL_COMMUNITY): Payer: Medicaid Other | Admitting: Anesthesiology

## 2022-12-09 ENCOUNTER — Other Ambulatory Visit: Payer: Self-pay

## 2022-12-09 ENCOUNTER — Encounter (HOSPITAL_COMMUNITY)
Admission: EM | Disposition: A | Discharge status: Home Health | Payer: Self-pay | Source: Home / Self Care | Attending: Internal Medicine

## 2022-12-09 DIAGNOSIS — R21 Rash and other nonspecific skin eruption: Secondary | ICD-10-CM | POA: Diagnosis not present

## 2022-12-09 DIAGNOSIS — M86471 Chronic osteomyelitis with draining sinus, right ankle and foot: Secondary | ICD-10-CM

## 2022-12-09 DIAGNOSIS — J449 Chronic obstructive pulmonary disease, unspecified: Secondary | ICD-10-CM

## 2022-12-09 DIAGNOSIS — N179 Acute kidney failure, unspecified: Secondary | ICD-10-CM | POA: Diagnosis not present

## 2022-12-09 DIAGNOSIS — M869 Osteomyelitis, unspecified: Secondary | ICD-10-CM | POA: Diagnosis not present

## 2022-12-09 DIAGNOSIS — L02611 Cutaneous abscess of right foot: Secondary | ICD-10-CM | POA: Diagnosis not present

## 2022-12-09 DIAGNOSIS — F17210 Nicotine dependence, cigarettes, uncomplicated: Secondary | ICD-10-CM | POA: Diagnosis not present

## 2022-12-09 DIAGNOSIS — R7881 Bacteremia: Secondary | ICD-10-CM | POA: Diagnosis not present

## 2022-12-09 DIAGNOSIS — M4647 Discitis, unspecified, lumbosacral region: Secondary | ICD-10-CM | POA: Diagnosis not present

## 2022-12-09 DIAGNOSIS — F418 Other specified anxiety disorders: Secondary | ICD-10-CM | POA: Diagnosis not present

## 2022-12-09 HISTORY — PX: I & D EXTREMITY: SHX5045

## 2022-12-09 LAB — RENAL FUNCTION PANEL
Albumin: 1.8 g/dL — ABNORMAL LOW (ref 3.5–5.0)
Anion gap: 12 (ref 5–15)
BUN: 52 mg/dL — ABNORMAL HIGH (ref 6–20)
CO2: 20 mmol/L — ABNORMAL LOW (ref 22–32)
Calcium: 7.8 mg/dL — ABNORMAL LOW (ref 8.9–10.3)
Chloride: 102 mmol/L (ref 98–111)
Creatinine, Ser: 1.85 mg/dL — ABNORMAL HIGH (ref 0.61–1.24)
GFR, Estimated: 45 mL/min — ABNORMAL LOW (ref >60)
Glucose, Bld: 96 mg/dL (ref 70–99)
Phosphorus: 5.4 mg/dL — ABNORMAL HIGH (ref 2.5–4.6)
Potassium: 3.9 mmol/L (ref 3.5–5.1)
Sodium: 134 mmol/L — ABNORMAL LOW (ref 135–145)

## 2022-12-09 LAB — CBC
HCT: 28.5 % — ABNORMAL LOW (ref 39.0–52.0)
Hemoglobin: 9.5 g/dL — ABNORMAL LOW (ref 13.0–17.0)
MCH: 29.2 pg (ref 26.0–34.0)
MCHC: 33.3 g/dL (ref 30.0–36.0)
MCV: 87.7 fL (ref 80.0–100.0)
Platelets: 274 10*3/uL (ref 150–400)
RBC: 3.25 MIL/uL — ABNORMAL LOW (ref 4.22–5.81)
RDW: 14.3 % (ref 11.5–15.5)
WBC: 20.8 10*3/uL — ABNORMAL HIGH (ref 4.0–10.5)
nRBC: 0 % (ref 0.0–0.2)

## 2022-12-09 SURGERY — IRRIGATION AND DEBRIDEMENT EXTREMITY
Anesthesia: Monitor Anesthesia Care

## 2022-12-09 MED ORDER — ONDANSETRON HCL 4 MG/2ML IJ SOLN
INTRAMUSCULAR | Status: DC | PRN
  Administered 2022-12-09: 4 mg via INTRAVENOUS

## 2022-12-09 MED ORDER — GUAIFENESIN-DM 100-10 MG/5ML PO SYRP: 15.0000 mL | ORAL_SOLUTION | ORAL | Status: DC | PRN

## 2022-12-09 MED ORDER — POTASSIUM CHLORIDE CRYS ER 20 MEQ PO TBCR: 20.0000 meq | EXTENDED_RELEASE_TABLET | Freq: Every day | ORAL | Status: DC | PRN

## 2022-12-09 MED ORDER — METOPROLOL TARTRATE 5 MG/5ML IV SOLN: 2.0000 mg | INTRAVENOUS | Status: DC | PRN

## 2022-12-09 MED ORDER — ROPIVACAINE HCL 5 MG/ML IJ SOLN
INTRAMUSCULAR | Status: DC | PRN
  Administered 2022-12-09: 40 mL via PERINEURAL

## 2022-12-09 MED ORDER — DOCUSATE SODIUM 100 MG PO CAPS
100.0000 mg | ORAL_CAPSULE | Freq: Every day | ORAL | Status: DC
  Administered 2022-12-11 – 2022-12-14 (×4): 100 mg via ORAL
  Filled 2022-12-09 (×5): qty 1

## 2022-12-09 MED ORDER — 0.9 % SODIUM CHLORIDE (POUR BTL) OPTIME
TOPICAL | Status: DC | PRN
  Administered 2022-12-09: 1000 mL

## 2022-12-09 MED ORDER — PHENOL 1.4 % MT LIQD: 1.0000 | OROMUCOSAL | Status: DC | PRN

## 2022-12-09 MED ORDER — JUVEN PO PACK
1.0000 | PACK | Freq: Two times a day (BID) | ORAL | Status: DC
  Administered 2022-12-09 – 2023-02-03 (×37): 1 via ORAL
  Filled 2022-12-09 (×59): qty 1

## 2022-12-09 MED ORDER — LABETALOL HCL 5 MG/ML IV SOLN: 10.0000 mg | INTRAVENOUS | Status: DC | PRN

## 2022-12-09 MED ORDER — ONDANSETRON HCL 4 MG/2ML IJ SOLN: 4.0000 mg | Freq: Four times a day (QID) | INTRAMUSCULAR | Status: DC | PRN

## 2022-12-09 MED ORDER — ACETAMINOPHEN 325 MG PO TABS: 325.0000 mg | ORAL_TABLET | Freq: Four times a day (QID) | ORAL | Status: DC | PRN

## 2022-12-09 MED ORDER — CHLORHEXIDINE GLUCONATE 0.12 % MT SOLN: 15.0000 mL | Freq: Once | OROMUCOSAL | Status: DC

## 2022-12-09 MED ORDER — BISACODYL 5 MG PO TBEC: 5.0000 mg | DELAYED_RELEASE_TABLET | Freq: Every day | ORAL | Status: DC | PRN

## 2022-12-09 MED ORDER — MIDAZOLAM HCL 2 MG/2ML IJ SOLN
INTRAMUSCULAR | Status: AC
  Filled 2022-12-09: qty 2

## 2022-12-09 MED ORDER — POLYETHYLENE GLYCOL 3350 17 G PO PACK: 17.0000 g | PACK | Freq: Every day | ORAL | Status: DC | PRN

## 2022-12-09 MED ORDER — PROPOFOL 500 MG/50ML IV EMUL
INTRAVENOUS | Status: DC | PRN
  Administered 2022-12-09: 150 ug/kg/min via INTRAVENOUS

## 2022-12-09 MED ORDER — ZINC SULFATE 220 (50 ZN) MG PO CAPS
220.0000 mg | ORAL_CAPSULE | Freq: Every day | ORAL | Status: DC
  Administered 2022-12-09 – 2022-12-15 (×7): 220 mg via ORAL
  Filled 2022-12-09 (×7): qty 1

## 2022-12-09 MED ORDER — CHLORHEXIDINE GLUCONATE 0.12 % MT SOLN
OROMUCOSAL | Status: AC
  Filled 2022-12-09: qty 15

## 2022-12-09 MED ORDER — ENOXAPARIN SODIUM 80 MG/0.8ML IJ SOSY
75.0000 mg | PREFILLED_SYRINGE | INTRAMUSCULAR | Status: DC
  Administered 2022-12-10 – 2022-12-15 (×6): 75 mg via SUBCUTANEOUS
  Filled 2022-12-09 (×6): qty 0.8

## 2022-12-09 MED ORDER — ORAL CARE MOUTH RINSE: 15.0000 mL | Freq: Once | OROMUCOSAL | Status: DC

## 2022-12-09 MED ORDER — FENTANYL CITRATE (PF) 250 MCG/5ML IJ SOLN
INTRAMUSCULAR | Status: AC
  Filled 2022-12-09: qty 5

## 2022-12-09 MED ORDER — OXYCODONE HCL 5 MG PO TABS
10.0000 mg | ORAL_TABLET | ORAL | Status: DC | PRN
  Administered 2022-12-09: 10 mg via ORAL
  Administered 2022-12-10 – 2022-12-13 (×5): 15 mg via ORAL
  Filled 2022-12-09 (×5): qty 3
  Filled 2022-12-09: qty 2

## 2022-12-09 MED ORDER — OXYCODONE HCL 5 MG PO TABS
5.0000 mg | ORAL_TABLET | ORAL | Status: DC | PRN
  Administered 2022-12-20: 10 mg via ORAL
  Administered 2022-12-21: 5 mg via ORAL
  Administered 2022-12-24 (×2): 10 mg via ORAL
  Filled 2022-12-09 (×3): qty 2
  Filled 2022-12-09: qty 1
  Filled 2022-12-09: qty 2

## 2022-12-09 MED ORDER — ALUM & MAG HYDROXIDE-SIMETH 200-200-20 MG/5ML PO SUSP: 15.0000 mL | ORAL | Status: DC | PRN

## 2022-12-09 MED ORDER — THIAMINE MONONITRATE 100 MG PO TABS
500.0000 mg | ORAL_TABLET | Freq: Every day | ORAL | Status: DC
  Administered 2022-12-09 – 2023-01-21 (×43): 500 mg via ORAL
  Filled 2022-12-09 (×43): qty 5

## 2022-12-09 MED ORDER — LACTATED RINGERS IV SOLN: INTRAVENOUS | Status: DC

## 2022-12-09 MED ORDER — HYDRALAZINE HCL 20 MG/ML IJ SOLN: 5.0000 mg | INTRAMUSCULAR | Status: DC | PRN

## 2022-12-09 MED ORDER — FENTANYL CITRATE (PF) 250 MCG/5ML IJ SOLN
INTRAMUSCULAR | Status: DC | PRN
  Administered 2022-12-09 (×2): 50 ug via INTRAVENOUS

## 2022-12-09 MED ORDER — VITAMIN C 500 MG PO TABS
1000.0000 mg | ORAL_TABLET | Freq: Every day | ORAL | Status: DC
  Administered 2022-12-09 – 2022-12-15 (×7): 1000 mg via ORAL
  Filled 2022-12-09 (×7): qty 2

## 2022-12-09 MED ORDER — PANTOPRAZOLE SODIUM 40 MG PO TBEC
40.0000 mg | DELAYED_RELEASE_TABLET | Freq: Every day | ORAL | Status: DC
  Administered 2022-12-09 – 2023-02-03 (×56): 40 mg via ORAL
  Filled 2022-12-09 (×57): qty 1

## 2022-12-09 MED ORDER — PROPOFOL 10 MG/ML IV BOLUS
INTRAVENOUS | Status: AC
  Filled 2022-12-09: qty 20

## 2022-12-09 MED ORDER — HYDROMORPHONE HCL 1 MG/ML IJ SOLN
0.5000 mg | INTRAMUSCULAR | Status: DC | PRN
  Administered 2022-12-17 – 2022-12-23 (×5): 1 mg via INTRAVENOUS
  Filled 2022-12-09 (×5): qty 1

## 2022-12-09 MED ORDER — MAGNESIUM CITRATE PO SOLN: 1.0000 | Freq: Once | ORAL | Status: DC | PRN

## 2022-12-09 MED ORDER — SODIUM CHLORIDE 0.9 % IV SOLN: INTRAVENOUS | Status: DC

## 2022-12-09 MED ORDER — LACTATED RINGERS IV SOLN: INTRAVENOUS | Status: DC | PRN

## 2022-12-09 MED ORDER — MAGNESIUM SULFATE 2 GM/50ML IV SOLN: 2.0000 g | Freq: Every day | INTRAVENOUS | Status: DC | PRN

## 2022-12-09 MED ORDER — MIDAZOLAM HCL 2 MG/2ML IJ SOLN
INTRAMUSCULAR | Status: DC | PRN
  Administered 2022-12-09 (×2): 1 mg via INTRAVENOUS

## 2022-12-09 SURGICAL SUPPLY — 38 items
BAG COUNTER SPONGE SURGICOUNT (BAG) IMPLANT
BAG SPNG CNTER NS LX DISP (BAG)
BLADE SURG 21 STRL SS (BLADE) ×2 IMPLANT
BNDG CMPR 5X4 CHSV STRCH STRL (GAUZE/BANDAGES/DRESSINGS) ×1
BNDG COHESIVE 4X5 TAN STRL LF (GAUZE/BANDAGES/DRESSINGS) IMPLANT
BNDG COHESIVE 6X5 TAN STRL LF (GAUZE/BANDAGES/DRESSINGS) IMPLANT
BNDG GAUZE DERMACEA FLUFF 4 (GAUZE/BANDAGES/DRESSINGS) ×4 IMPLANT
BNDG GZE DERMACEA 4 6PLY (GAUZE/BANDAGES/DRESSINGS) ×2
COVER SURGICAL LIGHT HANDLE (MISCELLANEOUS) ×4 IMPLANT
DRAPE U-SHAPE 47X51 STRL (DRAPES) ×2 IMPLANT
DRESSING PEEL AND PLC PRVNA 13 (GAUZE/BANDAGES/DRESSINGS) IMPLANT
DRSG ADAPTIC 3X8 NADH LF (GAUZE/BANDAGES/DRESSINGS) ×2 IMPLANT
DRSG DERMACEA 8X12 NADH (GAUZE/BANDAGES/DRESSINGS) IMPLANT
DRSG PEEL AND PLACE PREVENA 13 (GAUZE/BANDAGES/DRESSINGS) ×1
DURAPREP 26ML APPLICATOR (WOUND CARE) ×2 IMPLANT
ELECT REM PT RETURN 9FT ADLT (ELECTROSURGICAL)
ELECTRODE REM PT RTRN 9FT ADLT (ELECTROSURGICAL) IMPLANT
GAUZE SPONGE 4X4 12PLY STRL (GAUZE/BANDAGES/DRESSINGS) ×2 IMPLANT
GLOVE BIOGEL PI IND STRL 9 (GLOVE) ×2 IMPLANT
GLOVE SURG ORTHO 9.0 STRL STRW (GLOVE) ×2 IMPLANT
GOWN STRL REUS W/ TWL XL LVL3 (GOWN DISPOSABLE) ×4 IMPLANT
GOWN STRL REUS W/TWL XL LVL3 (GOWN DISPOSABLE) ×2
GRAFT SKIN WND MICRO 38 (Tissue) IMPLANT
HANDPIECE INTERPULSE COAX TIP (DISPOSABLE)
KIT BASIN OR (CUSTOM PROCEDURE TRAY) ×2 IMPLANT
KIT TURNOVER KIT B (KITS) ×2 IMPLANT
MANIFOLD NEPTUNE II (INSTRUMENTS) ×2 IMPLANT
NS IRRIG 1000ML POUR BTL (IV SOLUTION) ×2 IMPLANT
PACK ORTHO EXTREMITY (CUSTOM PROCEDURE TRAY) ×2 IMPLANT
PAD ARMBOARD 7.5X6 YLW CONV (MISCELLANEOUS) ×4 IMPLANT
SET HNDPC FAN SPRY TIP SCT (DISPOSABLE) IMPLANT
STOCKINETTE IMPERVIOUS 9X36 MD (GAUZE/BANDAGES/DRESSINGS) IMPLANT
SUT ETHILON 2 0 PSLX (SUTURE) ×2 IMPLANT
SWAB COLLECTION DEVICE MRSA (MISCELLANEOUS) ×2 IMPLANT
SWAB CULTURE ESWAB REG 1ML (MISCELLANEOUS) IMPLANT
TOWEL GREEN STERILE (TOWEL DISPOSABLE) ×2 IMPLANT
TUBE CONNECTING 12X1/4 (SUCTIONS) ×2 IMPLANT
YANKAUER SUCT BULB TIP NO VENT (SUCTIONS) ×2 IMPLANT

## 2022-12-09 NOTE — Op Note (Signed)
12/09/2022  10:43 AM  PATIENT:  Hunter Reyes    PRE-OPERATIVE DIAGNOSIS:  foot infection  POST-OPERATIVE DIAGNOSIS:  Same  PROCEDURE:  IRRIGATION AND DEBRIDEMENT OF FOOT  SURGEON:  Newt Minion, MD  PHYSICIAN ASSISTANT:None ANESTHESIA:   General  PREOPERATIVE INDICATIONS:  [name] is a  46 y.o. male with a diagnosis of foot infection who failed conservative measures and elected for surgical management.    The risks benefits and alternatives were discussed with the patient preoperatively including but not limited to the risks of infection, bleeding, nerve injury, cardiopulmonary complications, the need for revision surgery, among others, and the patient was willing to proceed.  OPERATIVE IMPLANTS: Kerecis micro 38 cm.  @ENCIMAGES @  OPERATIVE FINDINGS: Tissue margins were clear.  Patient had calcified arteries.  OPERATIVE PROCEDURE: Patient was brought the operating room and underwent a regional anesthetic.  After adequate levels anesthesia obtained patient's right lower extremity was prepped using DuraPrep draped into a sterile field a timeout was called.  A fishmouth incision was made just proximal to the ulcerative tissue across the metatarsal heads.  This was carried down to bone electrocautery was used for hemostasis the arteries were calcified with bleeding.  A oscillating saw was used to amputate through the metatarsals.  The wound was irrigated electrocautery was used for further hemostasis.  The wound bed was filled with 38 cm of Kerecis micro powder to cover a wound surface area of 100 cm.  The 2-0 nylon was used for skin closure.  This was covered with a 13 cm Prevena wound VAC.  This suction fit the leg was wrapped with Coban.  Patient was taken the PACU in stable condition.   DISCHARGE PLANNING:  Antibiotic duration: Patient should only need 24 hours of postoperative antibiotic coverage for the foot.  Weightbearing: Nonweightbearing on the  right.  Pain medication: Opioid pathway  Dressing care/ Wound VAC: Continue wound VAC for 1 week at discharge  Ambulatory devices: Walker or crutches  Discharge to: Discharge planning based on therapy recommendations.  Follow-up: In the office 1 week post operative.

## 2022-12-09 NOTE — Progress Notes (Signed)
Pt. Arrived to Short Stay with red rash arms,hands,abdomen and legs. Pt. States it's from an antibiotic,stated it started couple days ago. Pt. States it doesn't itch.

## 2022-12-09 NOTE — Anesthesia Postprocedure Evaluation (Signed)
Anesthesia Post Note  Patient: Hunter Reyes  Procedure(s) Performed: IRRIGATION AND DEBRIDEMENT OF FOOT     Patient location during evaluation: PACU Anesthesia Type: Regional and MAC Level of consciousness: awake and alert Pain management: pain level controlled Vital Signs Assessment: post-procedure vital signs reviewed and stable Respiratory status: spontaneous breathing, nonlabored ventilation, respiratory function stable and patient connected to nasal cannula oxygen Cardiovascular status: stable and blood pressure returned to baseline Postop Assessment: no apparent nausea or vomiting Anesthetic complications: no  No notable events documented.  Last Vitals:  Vitals:   12/09/22 1345 12/09/22 1400  BP: (!) 108/54 105/63  Pulse: 75 86  Resp: 13 (!) 8  Temp:    SpO2: 95% 94%    Last Pain:  Vitals:   12/09/22 1055  TempSrc:   PainSc: 0-No pain                 Maddeline Roorda

## 2022-12-09 NOTE — Anesthesia Procedure Notes (Signed)
Anesthesia Regional Block: Adductor canal block   Pre-Anesthetic Checklist: , timeout performed,  Correct Patient, Correct Site, Correct Laterality,  Correct Procedure, Correct Position, site marked,  Risks and benefits discussed,  Surgical consent,  Pre-op evaluation,  At surgeon's request and post-op pain management  Laterality: Right  Prep: Dura Prep       Needles:  Injection technique: Single-shot  Needle Type: Echogenic Stimulator Needle     Needle Length: 10cm  Needle Gauge: 20     Additional Needles:   Procedures:,,,, ultrasound used (permanent image in chart),,    Narrative:  Start time: 12/09/2022 9:34 AM End time: 12/09/2022 9:36 AM Injection made incrementally with aspirations every 5 mL.  Performed by: Personally  Anesthesiologist: Darral Dash, DO  Additional Notes: Patient identified. Risks/Benefits/Options discussed with patient including but not limited to bleeding, infection, nerve damage, failed block, incomplete pain control. Patient expressed understanding and wished to proceed. All questions were answered. Sterile technique was used throughout the entire procedure. Please see nursing notes for vital signs. Aspirated in 5cc intervals with injection for negative confirmation. Patient was given instructions on fall risk and not to get out of bed. All questions and concerns addressed with instructions to call with any issues or inadequate analgesia.

## 2022-12-09 NOTE — Anesthesia Preprocedure Evaluation (Addendum)
Anesthesia Evaluation  Patient identified by MRN, date of birth, ID band Patient awake    Reviewed: Allergy & Precautions, NPO status , Patient's Chart, lab work & pertinent test results  Airway Mallampati: II  TM Distance: >3 FB Neck ROM: Full    Dental  (+) Poor Dentition, Missing   Pulmonary COPD, Current Smoker and Patient abstained from smoking.   Pulmonary exam normal        Cardiovascular negative cardio ROS  Rhythm:Regular Rate:Normal     Neuro/Psych   Anxiety Depression Bipolar Disorder      GI/Hepatic negative GI ROS,,,  Endo/Other  negative endocrine ROS    Renal/GU   negative genitourinary   Musculoskeletal  (+) Arthritis ,  Osteomyelitis right foot    Abdominal Normal abdominal exam  (+)   Peds  Hematology negative hematology ROS (+)   Anesthesia Other Findings   Reproductive/Obstetrics                             Anesthesia Physical Anesthesia Plan  ASA: 2  Anesthesia Plan: MAC and Regional   Post-op Pain Management: Regional block*   Induction: Intravenous  PONV Risk Score and Plan: Ondansetron, Dexamethasone, Midazolam, Treatment may vary due to age or medical condition and Propofol infusion  Airway Management Planned: Simple Face Mask, Natural Airway and Nasal Cannula  Additional Equipment: None  Intra-op Plan:   Post-operative Plan:   Informed Consent: I have reviewed the patients History and Physical, chart, labs and discussed the procedure including the risks, benefits and alternatives for the proposed anesthesia with the patient or authorized representative who has indicated his/her understanding and acceptance.     Dental advisory given  Plan Discussed with: CRNA  Anesthesia Plan Comments:        Anesthesia Quick Evaluation

## 2022-12-09 NOTE — Plan of Care (Signed)
  Problem: Fluid Volume: Goal: Hemodynamic stability will improve Outcome: Progressing   Problem: Clinical Measurements: Goal: Diagnostic test results will improve Outcome: Progressing   Problem: Respiratory: Goal: Ability to maintain adequate ventilation will improve Outcome: Progressing   Problem: Education: Goal: Knowledge of General Education information will improve Description: Including pain rating scale, medication(s)/side effects and non-pharmacologic comfort measures Outcome: Progressing   Problem: Health Behavior/Discharge Planning: Goal: Ability to manage health-related needs will improve Outcome: Progressing

## 2022-12-09 NOTE — Progress Notes (Signed)
Orthopedic Tech Progress Note Patient Details:  Hunter Reyes [birth year] [id number]  Ortho Devices Type of Ortho Device: Postop shoe/boot Ortho Device/Splint Location: RLE Ortho Device/Splint Interventions: Ordered   Post Interventions Instructions Provided: Care of device, Adjustment of device POS dropped off with NT in room. Vernona Rieger 12/09/2022, 6:34 PM

## 2022-12-09 NOTE — Anesthesia Procedure Notes (Signed)
Anesthesia Regional Block: Popliteal block   Pre-Anesthetic Checklist: , timeout performed,  Correct Patient, Correct Site, Correct Laterality,  Correct Procedure, Correct Position, site marked,  Risks and benefits discussed,  Surgical consent,  Pre-op evaluation,  At surgeon's request and post-op pain management  Laterality: Right  Prep: Dura Prep       Needles:  Injection technique: Single-shot  Needle Type: Echogenic Stimulator Needle     Needle Length: 10cm  Needle Gauge: 20     Additional Needles:   Procedures:,,,, ultrasound used (permanent image in chart),,    Narrative:  Start time: 12/09/2022 9:37 AM End time: 12/09/2022 9:39 AM Injection made incrementally with aspirations every 5 mL.  Performed by: Personally  Anesthesiologist: Darral Dash, DO  Additional Notes: Patient identified. Risks/Benefits/Options discussed with patient including but not limited to bleeding, infection, nerve damage, failed block, incomplete pain control. Patient expressed understanding and wished to proceed. All questions were answered. Sterile technique was used throughout the entire procedure. Please see nursing notes for vital signs. Aspirated in 5cc intervals with injection for negative confirmation. Patient was given instructions on fall risk and not to get out of bed. All questions and concerns addressed with instructions to call with any issues or inadequate analgesia.

## 2022-12-09 NOTE — Progress Notes (Addendum)
PROGRESS NOTE    VIPUL CAFARELLI  OEV:[id number] DOB: December 15, 1976 DOA: 12/02/2022 PCP: Celene Squibb, MD     Chief Complaint  Patient presents with   Urinary Retention   Back Pain    Brief Narrative:  Hunter Reyes is a 46 y.o. Caucasian male with medical history significant for bipolar 1 disorder, borderline personality disorder, COPD, crack cocaine abuse, depression, peripheral neuropathy, history of right foot osteomyelitis, panic attack and history of seizures, who presented to the emergency room with acute onset of altered mental status with associated urinary retention and low back pain.  The patient has not urinated for 3 days.  Hlabs hyperkalemia 5.8, BUN more than 300 and creatinine 20.99 with calcium of 8.8 anion gap of 26, albumin of 2.7 and total protein of 8.9.  Serum lipase was 222.  Lactic acid was 1.2 and CK 40.  CBC showed leukocytosis of 32.2 with neutrophilia and thrombocytosis of 411 with mild anemia with hemoglobin of 12.8 hematocrit 27.1.  Previous BUN and creatinine were 8 and 0.95 however on 09/28/2017.  Assessment & Plan:   Principal Problem:   MRSA bacteremia Active Problems:   AKI (acute kidney injury) ([place])   Diskitis   Bipolar 1 disorder ([place])   Peripheral neuropathy   Polysubstance abuse (HCC)   Metabolic acidosis   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 (HCC)   Cutaneous abscess of right foot    AKI (acute kidney injury) ([place]) Urinary retention - his is likely prerenal due to severe dehydration as well as obstructive uropathy and the use of nephrotoxic agents (NSAIDs). -At time of admission BUN about 300 and creatinine more than 20. -After Foley catheter placed and fluid resuscitation provided  -Function continues to improve, creatinine is 1.45 today, -Continue to monitor electrolyte closely for postobstructive diuresis - continue with flomax - creatinine up to 1.8 today, will resume on IV fluids.  And avoid nephrotoxic  medications  Hyperkalemia  - Due to AKI, resolved with Lokelma  Hypernatremia Hyperchloremia Metabolic acidosis  -This has resolved with bicarb drip.  Diskitis/MRSA bacteremia/epidural abscess -Discitis as evident on imaging -Patient appears with lower extremity weakness, urinary retention, possible incontinence, unclear acuity, as report last time he was able to stand up was a month ago -Management per ID, vancomycin has been switched to daptomycin -  sp Right L4 laminotomy, sublaminar decompression with debridement of ventral epidural abscess  by Dr. Kathyrn Sheriff  -Concern of both feet having osteomyelitis, could be the source, MRI is pending. -TEE with no vegetation, at this point unclear if he will need TEE or not as likely will need to finish at least 6 weeks of IV antibiotics.  Right foot abscess/to myelitis/tenosynovitis -Dr. Sharol Given consulted, plan for OR today  Rash -With progressive micropapular rash, will discuss with ID if we need to change antibiotics.   Peripheral neuropathy -Continue Neurontin and adjusted dose   Bipolar 1 disorder ([place]) -No suicidal ideation or hallucination -Stable mood currently -Continue Abilify-antidepressant therapy.   Polysubstance abuse ([place]) -This includes cocaine and likely opiates. -Continue the use of Suboxone   Bilateral heel ulcers POA  - cont with wound care, MRI of both feet tomorrow.    Hypomagnesemia Hypophosphatemia -Repleted  Hepatitis B infection -ID is following    DVT prophylaxis: Lovenox Code Status: Full Family Communication: None at bedside Disposition:   Status is: Inpatient    Consultants:  ID Neurosurgery Orthopedic Dr. Sharol Given  Subjective:  No significant events overnight, he denies any complaints today  Objective: Vitals:   12/08/22 1849 12/08/22 2047 12/09/22 0000 12/09/22 0914  BP:  (!) 119/94  (!) 110/56  Pulse: 95 91 83 89  Resp: 13 13  14   Temp:   98.9 F (37.2 C) 98.7 F (37.1 C)   TempSrc:   Oral Oral  SpO2: (!) 89% 100%  95%  Weight:      Height:        Intake/Output Summary (Last 24 hours) at 12/09/2022 0935 Last data filed at 12/09/2022 0900 Gross per 24 hour  Intake 117 ml  Output 2900 ml  Net -2783 ml   Filed Weights   12/02/22 2230  Weight: (!) 149 kg    Examination:   Awake Alert, Oriented X 3, appropriate and coherent today, pleasant Symmetrical Chest wall movement, Good air movement bilaterally, CTAB RRR,No Gallops,Rubs or new Murmurs, No Parasternal Heave +ve B.Sounds, Abd Soft, No tenderness, No rebound - guarding or rigidity. No Cyanosis, Clubbing or edema,  Patient with progressive maculopapular rash extending today to the extremities, please see picture below.             Data Reviewed: I have personally reviewed following labs and imaging studies  CBC: Recent Labs  Lab 12/03/22 0007 12/03/22 0500 12/06/22 0451 12/07/22 0311 12/08/22 0224 12/09/22 0201  WBC 32.2* 33.8* 34.3* 25.1* 22.5* 20.8*  NEUTROABS 27.0*  --   --   --   --   --   HGB 12.8* 13.1 11.1* 10.4* 10.2* 9.5*  HCT 37.1* 38.4* 33.4* 32.1* 30.7* 28.5*  MCV 82.8 84.2 88.1 88.7 87.7 87.7  PLT 411* 446* 321 289 283 376    Basic Metabolic Panel: Recent Labs  Lab 12/05/22 0524 12/06/22 0451 12/06/22 1509 12/07/22 0314 12/08/22 0255 12/09/22 0211  NA 149* 148* 145 140 139 134*  K 5.3* 5.4* 3.6 3.8 3.4* 3.9  CL 125* 124* 119* 111 107 102  CO2 15* 15* 18* 19* 23 20*  GLUCOSE 110* 125* 139* 96 92 96  BUN 89* 61* 53* 45* 40* 52*  CREATININE 1.88* 1.48* 1.20 1.30* 1.45* 1.85*  CALCIUM 8.8* 8.3* 8.2* 7.9* 8.0* 7.8*  PHOS 4.7* 4.6  --  3.6 4.0 5.4*    GFR: Estimated Creatinine Clearance: 77.7 mL/min (A) (by C-G formula based on SCr of 1.85 mg/dL (H)).  Liver Function Tests: Recent Labs  Lab 12/03/22 0007 12/04/22 1007 12/05/22 0524 12/06/22 0451 12/07/22 0314 12/08/22 0255 12/09/22 0211  AST 17  --   --   --   --   --   --   ALT 15  --    --   --   --   --   --   ALKPHOS 99  --   --   --   --   --   --   BILITOT 1.0  --   --   --   --   --   --   PROT 8.9*  --   --   --   --   --   --   ALBUMIN 2.7*   < > 2.3* 2.2* 2.0* 2.0* 1.8*   < > = values in this interval not displayed.    CBG: Recent Labs  Lab 12/06/22 2103  GLUCAP 110*     Recent Results (from the past 240 hour(s))  Resp Panel by RT-PCR (Flu A&B, Covid) Anterior Nasal Swab     Status: None   Collection Time: 12/02/22 11:30 PM   Specimen: Anterior Nasal  Swab  Result Value Ref Range Status   SARS Coronavirus 2 by RT PCR NEGATIVE NEGATIVE Final    Comment: (NOTE) SARS-CoV-2 target nucleic acids are NOT DETECTED.  The SARS-CoV-2 RNA is generally detectable in upper respiratory specimens during the acute phase of infection. The lowest concentration of SARS-CoV-2 viral copies this assay can detect is 138 copies/mL. A negative result does not preclude SARS-Cov-2 infection and should not be used as the sole basis for treatment or other patient management decisions. A negative result may occur with  improper specimen collection/handling, submission of specimen other than nasopharyngeal swab, presence of viral mutation(s) within the areas targeted by this assay, and inadequate number of viral copies(<138 copies/mL). A negative result must be combined with clinical observations, patient history, and epidemiological information. The expected result is Negative.  Fact Sheet for Patients:  EntrepreneurPulse.com.au  Fact Sheet for Healthcare Providers:  IncredibleEmployment.be  This test is no t yet approved or cleared by the [place] FDA and  has been authorized for detection and/or diagnosis of SARS-CoV-2 by FDA under an Emergency Use Authorization (EUA). This EUA will remain  in effect (meaning this test can be used) for the duration of the COVID-19 declaration under Section 564(b)(1) of the Act, 21 U.S.C.section  360bbb-3(b)(1), unless the authorization is terminated  or revoked sooner.       Influenza A by PCR NEGATIVE NEGATIVE Final   Influenza B by PCR NEGATIVE NEGATIVE Final    Comment: (NOTE) The Xpert Xpress SARS-CoV-2/FLU/RSV plus assay is intended as an aid in the diagnosis of influenza from Nasopharyngeal swab specimens and should not be used as a sole basis for treatment. Nasal washings and aspirates are unacceptable for Xpert Xpress SARS-CoV-2/FLU/RSV testing.  Fact Sheet for Patients: EntrepreneurPulse.com.au  Fact Sheet for Healthcare Providers: IncredibleEmployment.be  This test is not yet approved or cleared by the [place] FDA and has been authorized for detection and/or diagnosis of SARS-CoV-2 by FDA under an Emergency Use Authorization (EUA). This EUA will remain in effect (meaning this test can be used) for the duration of the COVID-19 declaration under Section 564(b)(1) of the Act, 21 U.S.C. section 360bbb-3(b)(1), unless the authorization is terminated or revoked.  Performed at [facility], [address], [place], [place] [address]   Blood culture (routine x 2)     Status: Abnormal   Collection Time: 12/03/22  4:00 AM   Specimen: Site Not Specified; Blood  Result Value Ref Range Status   Specimen Description   Final    SITE NOT SPECIFIED BOTTLES DRAWN AEROBIC AND ANAEROBIC Performed at [facility], [address], [place], [place] [address]    Special Requests   Final    Blood Culture adequate volume Performed at [address] [place], [place] [address]    Culture  Setup Time   Final    GRAM POSITIVE COCCI AEB BOTTLES DRAWN AEROBIC ONLY Gram Stain Report Called to,Read Back By and Verified With: REECE THOMPSON,RN @2143  ON 12/04/22 BY SSLANE CRITICAL RESULT CALLED TO, READ BACK BY AND VERIFIED WITH: G ABBOTT,PHARMD@0200  12/05/22 [place] GRAM POSITIVE COCCI ANAEROBIC BOTTLE ONLY RESULT PREVIOUSLY  CALLED Performed at [facility], [address], [place], [place] [address]    Culture METHICILLIN RESISTANT STAPHYLOCOCCUS AUREUS (A)  Final   Report Status 12/08/2022 FINAL  Final   Organism ID, Bacteria METHICILLIN RESISTANT STAPHYLOCOCCUS AUREUS  Final      Susceptibility   Methicillin resistant staphylococcus aureus - MIC*    CIPROFLOXACIN >=8  RESISTANT Resistant     ERYTHROMYCIN >=8 RESISTANT Resistant     GENTAMICIN <=0.5 SENSITIVE Sensitive     OXACILLIN >=4 RESISTANT Resistant     TETRACYCLINE <=1 SENSITIVE Sensitive     VANCOMYCIN <=0.5 SENSITIVE Sensitive     TRIMETH/SULFA 20 SENSITIVE Sensitive     CLINDAMYCIN <=0.25 SENSITIVE Sensitive     RIFAMPIN <=0.5 SENSITIVE Sensitive     Inducible Clindamycin NEGATIVE Sensitive     * METHICILLIN RESISTANT STAPHYLOCOCCUS AUREUS  Blood Culture ID Panel (Reflexed)     Status: Abnormal   Collection Time: 12/03/22  4:00 AM  Result Value Ref Range Status   Enterococcus faecalis NOT DETECTED NOT DETECTED Final   Enterococcus Faecium NOT DETECTED NOT DETECTED Final   Listeria monocytogenes NOT DETECTED NOT DETECTED Final   Staphylococcus species DETECTED (A) NOT DETECTED Final    Comment: CRITICAL RESULT CALLED TO, READ BACK BY AND VERIFIED WITH: G ABBOTT,PHARMD@0200  12/05/22 [place]    Staphylococcus aureus (BCID) DETECTED (A) NOT DETECTED Final    Comment: Methicillin (oxacillin)-resistant Staphylococcus aureus (MRSA). MRSA is predictably resistant to beta-lactam antibiotics (except ceftaroline). Preferred therapy is vancomycin unless clinically contraindicated. Patient requires contact precautions if  hospitalized. CRITICAL RESULT CALLED TO, READ BACK BY AND VERIFIED WITH: G ABBOTT,PHARMD@0200  12/05/22 [place]    Staphylococcus epidermidis NOT DETECTED NOT DETECTED Final   Staphylococcus lugdunensis NOT DETECTED NOT DETECTED Final   Streptococcus species NOT DETECTED NOT DETECTED Final   Streptococcus agalactiae NOT DETECTED NOT DETECTED  Final   Streptococcus pneumoniae NOT DETECTED NOT DETECTED Final   Streptococcus pyogenes NOT DETECTED NOT DETECTED Final   A.calcoaceticus-baumannii NOT DETECTED NOT DETECTED Final   Bacteroides fragilis NOT DETECTED NOT DETECTED Final   Enterobacterales NOT DETECTED NOT DETECTED Final   Enterobacter cloacae complex NOT DETECTED NOT DETECTED Final   Escherichia coli NOT DETECTED NOT DETECTED Final   Klebsiella aerogenes NOT DETECTED NOT DETECTED Final   Klebsiella oxytoca NOT DETECTED NOT DETECTED Final   Klebsiella pneumoniae NOT DETECTED NOT DETECTED Final   Proteus species NOT DETECTED NOT DETECTED Final   Salmonella species NOT DETECTED NOT DETECTED Final   Serratia marcescens NOT DETECTED NOT DETECTED Final   Haemophilus influenzae NOT DETECTED NOT DETECTED Final   Neisseria meningitidis NOT DETECTED NOT DETECTED Final   Pseudomonas aeruginosa NOT DETECTED NOT DETECTED Final   Stenotrophomonas maltophilia NOT DETECTED NOT DETECTED Final   Candida albicans NOT DETECTED NOT DETECTED Final   Candida auris NOT DETECTED NOT DETECTED Final   Candida glabrata NOT DETECTED NOT DETECTED Final   Candida krusei NOT DETECTED NOT DETECTED Final   Candida parapsilosis NOT DETECTED NOT DETECTED Final   Candida tropicalis NOT DETECTED NOT DETECTED Final   Cryptococcus neoformans/gattii NOT DETECTED NOT DETECTED Final   Meth resistant mecA/C and MREJ DETECTED (A) NOT DETECTED Final    Comment: CRITICAL RESULT CALLED TO, READ BACK BY AND VERIFIED WITH: G ABBOTT,PHARMD@0200  12/05/22 [place] Performed at [facility], [address] [place], [place] [address]   Urine Culture     Status: Abnormal   Collection Time: 12/03/22  4:01 AM   Specimen: Urine, Catheterized  Result Value Ref Range Status   Specimen Description   Final    URINE, CATHETERIZED Performed at [facility], [address], [place], [place] [address]    Special Requests   Final    NONE Performed at [facility], [address] [place], [place] [address]    Culture (A)  Final  20,000 COLONIES/mL METHICILLIN RESISTANT STAPHYLOCOCCUS AUREUS   Report Status 12/05/2022 FINAL  Final   Organism ID, Bacteria METHICILLIN RESISTANT STAPHYLOCOCCUS AUREUS (A)  Final      Susceptibility   Methicillin resistant staphylococcus aureus - MIC*    CIPROFLOXACIN >=8 RESISTANT Resistant     GENTAMICIN <=0.5 SENSITIVE Sensitive     NITROFURANTOIN 32 SENSITIVE Sensitive     OXACILLIN >=4 RESISTANT Resistant     TETRACYCLINE <=1 SENSITIVE Sensitive     VANCOMYCIN <=0.5 SENSITIVE Sensitive     TRIMETH/SULFA <=10 SENSITIVE Sensitive     CLINDAMYCIN <=0.25 SENSITIVE Sensitive     RIFAMPIN <=0.5 SENSITIVE Sensitive     Inducible Clindamycin NEGATIVE Sensitive     * 20,000 COLONIES/mL METHICILLIN RESISTANT STAPHYLOCOCCUS AUREUS  Blood culture (routine x 2)     Status: Abnormal   Collection Time: 12/03/22  4:10 AM   Specimen: BLOOD  Result Value Ref Range Status   Specimen Description BLOOD SITE NOT SPECIFIED  Final   Special Requests   Final    BOTTLES DRAWN AEROBIC AND ANAEROBIC Blood Culture adequate volume   Culture  Setup Time   Final    CRITICAL RESULT CALLED TO, READ BACK BY AND VERIFIED WITH: IN BOTH AEROBIC AND ANAEROBIC BOTTLES GRAM POSITIVE COCCI Gram Stain Report Called to,Read Back By and Verified With: NAKIA MCGOWAN @ 7673 ON 12/05/22 [place] CRITICAL RESULT CALLED TO, READ BACK BY AND VERIFIED WITH: RN Minna Merritts [facility] ON 12/05/22 @ 1950 BY DRT    Culture (A)  Final    STAPHYLOCOCCUS AUREUS SUSCEPTIBILITIES PERFORMED ON PREVIOUS CULTURE WITHIN THE LAST 5 DAYS. Performed at [facility], [place] [address] [place], [place] [address]    Report Status 12/08/2022 FINAL  Final  Blood Culture ID Panel (Reflexed)     Status: Abnormal   Collection Time: 12/03/22  4:10 AM  Result Value Ref Range Status   Enterococcus faecalis NOT DETECTED NOT DETECTED Final   Enterococcus Faecium NOT DETECTED NOT DETECTED Final    Listeria monocytogenes NOT DETECTED NOT DETECTED Final   Staphylococcus species DETECTED (A) NOT DETECTED Final    Comment: CRITICAL RESULT CALLED TO, READ BACK BY AND VERIFIED WITH: RN Minna Merritts [facility] ON 12/05/22 @ 1950 BY DRT    Staphylococcus aureus (BCID) DETECTED (A) NOT DETECTED Final    Comment: Methicillin (oxacillin)-resistant Staphylococcus aureus (MRSA). MRSA is predictably resistant to beta-lactam antibiotics (except ceftaroline). Preferred therapy is vancomycin unless clinically contraindicated. Patient requires contact precautions if  hospitalized. CRITICAL RESULT CALLED TO, READ BACK BY AND VERIFIED WITH: RN Minna Merritts [facility] ON 12/05/22 @ 1950 BY DRT    Staphylococcus epidermidis NOT DETECTED NOT DETECTED Final   Staphylococcus lugdunensis NOT DETECTED NOT DETECTED Final   Streptococcus species NOT DETECTED NOT DETECTED Final   Streptococcus agalactiae NOT DETECTED NOT DETECTED Final   Streptococcus pneumoniae NOT DETECTED NOT DETECTED Final   Streptococcus pyogenes NOT DETECTED NOT DETECTED Final   A.calcoaceticus-baumannii NOT DETECTED NOT DETECTED Final   Bacteroides fragilis NOT DETECTED NOT DETECTED Final   Enterobacterales NOT DETECTED NOT DETECTED Final   Enterobacter cloacae complex NOT DETECTED NOT DETECTED Final   Escherichia coli NOT DETECTED NOT DETECTED Final   Klebsiella aerogenes NOT DETECTED NOT DETECTED Final   Klebsiella oxytoca NOT DETECTED NOT DETECTED Final   Klebsiella pneumoniae NOT DETECTED NOT DETECTED Final   Proteus species NOT DETECTED NOT DETECTED Final   Salmonella species NOT DETECTED NOT DETECTED Final   Serratia marcescens NOT DETECTED NOT  DETECTED Final   Haemophilus influenzae NOT DETECTED NOT DETECTED Final   Neisseria meningitidis NOT DETECTED NOT DETECTED Final   Pseudomonas aeruginosa NOT DETECTED NOT DETECTED Final   Stenotrophomonas maltophilia NOT DETECTED NOT DETECTED Final   Candida albicans NOT DETECTED NOT DETECTED Final    Candida auris NOT DETECTED NOT DETECTED Final   Candida glabrata NOT DETECTED NOT DETECTED Final   Candida krusei NOT DETECTED NOT DETECTED Final   Candida parapsilosis NOT DETECTED NOT DETECTED Final   Candida tropicalis NOT DETECTED NOT DETECTED Final   Cryptococcus neoformans/gattii NOT DETECTED NOT DETECTED Final   Meth resistant mecA/C and MREJ DETECTED (A) NOT DETECTED Final    Comment: CRITICAL RESULT CALLED TO, READ BACK BY AND VERIFIED WITH: RN Demetrio Lapping ON 12/05/22 @ 1950 BY DRT Performed at [facility], [place] [address] [place], [place] [address]   Culture, blood (Routine X 2) w Reflex to ID Panel     Status: Abnormal   Collection Time: 12/05/22  9:12 AM   Specimen: BLOOD  Result Value Ref Range Status   Specimen Description BLOOD LEFT ANTECUBITAL  Final   Special Requests   Final    BOTTLES DRAWN AEROBIC AND ANAEROBIC Blood Culture adequate volume   Culture  Setup Time   Final    GRAM POSITIVE COCCI IN CLUSTERS IN BOTH AEROBIC AND ANAEROBIC BOTTLES CRITICAL RESULT CALLED TO, READ BACK BY AND VERIFIED WITH: [place], AT 1334 12/06/22 D. VANHOOK    Culture (A)  Final    STAPHYLOCOCCUS SPECIES (COAGULASE NEGATIVE) STAPHYLOCOCCUS AUREUS SUSCEPTIBILITIES PERFORMED ON PREVIOUS CULTURE WITHIN THE LAST 5 DAYS. Performed at [facility], [place] [address] [place], [place] [address]    Report Status 12/08/2022 FINAL  Final   Organism ID, Bacteria STAPHYLOCOCCUS SPECIES (COAGULASE NEGATIVE)  Final      Susceptibility   Staphylococcus species (coagulase negative) - MIC*    CIPROFLOXACIN >=8 RESISTANT Resistant     ERYTHROMYCIN RESISTANT Resistant     GENTAMICIN <=0.5 SENSITIVE Sensitive     OXACILLIN >=4 RESISTANT Resistant     TETRACYCLINE <=1 SENSITIVE Sensitive     VANCOMYCIN <=0.5 SENSITIVE Sensitive     TRIMETH/SULFA <=10 SENSITIVE Sensitive     CLINDAMYCIN RESISTANT Resistant     RIFAMPIN <=0.5 SENSITIVE Sensitive     Inducible Clindamycin  POSITIVE Resistant     * STAPHYLOCOCCUS SPECIES (COAGULASE NEGATIVE)  Culture, blood (Routine X 2) w Reflex to ID Panel     Status: Abnormal   Collection Time: 12/05/22  9:15 AM   Specimen: BLOOD LEFT WRIST  Result Value Ref Range Status   Specimen Description BLOOD LEFT WRIST  Final   Special Requests   Final    BOTTLES DRAWN AEROBIC AND ANAEROBIC Blood Culture adequate volume   Culture  Setup Time   Final    GRAM POSITIVE COCCI IN CLUSTERS AEROBIC BOTTLE ONLY CRITICAL VALUE NOTED.  VALUE IS CONSISTENT WITH PREVIOUSLY REPORTED AND CALLED VALUE.    Culture (A)  Final    STAPHYLOCOCCUS SPECIES (COAGULASE NEGATIVE) SUSCEPTIBILITIES PERFORMED ON PREVIOUS CULTURE WITHIN THE LAST 5 DAYS. Performed at [facility], [place] [address], [place], [place] [address]    Report Status 12/08/2022 FINAL  Final  Aerobic/Anaerobic Culture w Gram Stain (surgical/deep wound)     Status: None (Preliminary result)   Collection Time: 12/05/22  7:52 PM   Specimen: Abscess  Result Value Ref Range Status   Specimen Description ABSCESS  Final  Special Requests LUMBAR 4 EPIDURAL SPACE  Final   Gram Stain   Final    RARE WBC PRESENT, PREDOMINANTLY PMN NO ORGANISMS SEEN Performed at [facility], [place] [address], [place], [place] [address]    Culture   Final    RARE METHICILLIN RESISTANT STAPHYLOCOCCUS AUREUS NO ANAEROBES ISOLATED; CULTURE IN PROGRESS FOR 5 DAYS    Report Status PENDING  Incomplete   Organism ID, Bacteria METHICILLIN RESISTANT STAPHYLOCOCCUS AUREUS  Final      Susceptibility   Methicillin resistant staphylococcus aureus - MIC*    CIPROFLOXACIN >=8 RESISTANT Resistant     ERYTHROMYCIN >=8 RESISTANT Resistant     GENTAMICIN <=0.5 SENSITIVE Sensitive     OXACILLIN >=4 RESISTANT Resistant     TETRACYCLINE <=1 SENSITIVE Sensitive     VANCOMYCIN <=0.5 SENSITIVE Sensitive     TRIMETH/SULFA <=10 SENSITIVE Sensitive     CLINDAMYCIN <=0.25 SENSITIVE Sensitive     RIFAMPIN <=0.5  SENSITIVE Sensitive     Inducible Clindamycin NEGATIVE Sensitive     * RARE METHICILLIN RESISTANT STAPHYLOCOCCUS AUREUS  Culture, blood (Routine X 2) w Reflex to ID Panel     Status: None (Preliminary result)   Collection Time: 12/06/22  3:09 PM   Specimen: BLOOD LEFT HAND  Result Value Ref Range Status   Specimen Description BLOOD LEFT HAND  Final   Special Requests   Final    BOTTLES DRAWN AEROBIC ONLY Blood Culture adequate volume   Culture   Final    NO GROWTH 3 DAYS Performed at [facility], [address], [place], [place] [address]    Report Status PENDING  Incomplete  Culture, blood (Routine X 2) w Reflex to ID Panel     Status: None (Preliminary result)   Collection Time: 12/06/22  3:10 PM   Specimen: BLOOD RIGHT HAND  Result Value Ref Range Status   Specimen Description BLOOD RIGHT HAND  Final   Special Requests   Final    BOTTLES DRAWN AEROBIC AND ANAEROBIC Blood Culture adequate volume   Culture   Final    NO GROWTH 3 DAYS Performed at [facility], [place] [address], [place], [place] [address]    Report Status PENDING  Incomplete         Radiology Studies: MR FOOT RIGHT WO CONTRAST  Result Date: 12/07/2022 CLINICAL DATA:  Foot swelling, diabetic, osteomyelitis suspected. EXAM: MRI OF THE RIGHT FOREFOOT WITHOUT CONTRAST TECHNIQUE: Multiplanar, multisequence MR imaging of the right forefoot was performed. No intravenous contrast was administered. COMPARISON:  Radiographs dated September 28, 2017 FINDINGS: Bones/Joint/Cartilage Bone marrow edema of the fourth metatarsal shaft and head. Bone marrow edema of the head of the second and third metatarsals with surrounding inflammatory changes concerning for osteomyelitis. Hallux valgus deformity as well as hammertoe deformities of the digits. Ligaments Lisfranc ligament is intact. Evaluation of collateral ligaments of the foot is significantly limited. Soft tissues There is a fluid collection about the plantar aspect  of the second and third metatarsal heads which extends to the dorsal aspect of the foot measuring approximately 4.4 x 2.2 x 2.3 cm consistent with abscess. There is soft tissue edema about this abscess. Muscles and Tendons There is marked edema about the plantar tendons with edema and inflammatory changes extends up to the above-mentioned abscess, suggesting infectious tenosynovitis. IMPRESSION: 1. Fluid collection about the plantar aspect of the second and third metatarsal heads which extends to the dorsal aspect of the foot measuring approximately  4.4 x 2.2 x 2.3 cm consistent with abscess. 2. Bone marrow edema of the head of the second and third metatarsals with surrounding inflammatory changes concerning for osteomyelitis. 3. Bone marrow edema of the fourth metatarsal shaft and head concerning for osteomyelitis. 4. Marked edema about the plantar tendons with edema and inflammatory changes extending up to the above-mentioned abscess, suggesting infectious tenosynovitis. 5. Hallux valgus deformity as well as hammertoe deformities of the digits. Electronically Signed   By: Keane Police D.O.   On: 12/07/2022 20:22        Scheduled Meds:  [MAR Hold] buprenorphine-naloxone  1 tablet Sublingual BID   chlorhexidine  15 mL Mouth/Throat Once   Or   mouth rinse  15 mL Mouth Rinse Once   [MAR Hold] Chlorhexidine Gluconate Cloth  6 each Topical Q0600   [MAR Hold] enoxaparin (LOVENOX) injection  40 mg Subcutaneous Daily   [MAR Hold] gabapentin  600 mg Oral BID   [MAR Hold] LORazepam  1 mg Intravenous Once   [MAR Hold] polyethylene glycol  17 g Oral Daily   [MAR Hold] sertraline  25 mg Oral Daily   [MAR Hold] sodium zirconium cyclosilicate  10 g Oral Once   Continuous Infusions:  [MAR Hold] DAPTOmycin (CUBICIN) 850 mg in sodium chloride 0.9 % IVPB Stopped (12/08/22 2121)   lactated ringers 10 mL/hr at 12/09/22 0922   [MAR Hold] thiamine (VITAMIN B1) injection Stopped (12/08/22 1023)     LOS: 6 days         Phillips Climes, MD [facility]   To contact the attending provider between 7A-7P or the covering provider during after hours 7P-7A, please log into the web site [email or web address] and access using universal [place] password for that web site. If you do not have the password, please call the hospital operator.  12/09/2022, 9:35 AM

## 2022-12-09 NOTE — Consult Note (Signed)
ORTHOPAEDIC CONSULTATION  REQUESTING PHYSICIAN: Elgergawy, Silver Huguenin, MD  Chief Complaint: Ulceration abscess right foot.  HPI: Hunter Reyes is a 46 y.o. male who presents with ulceration abscess osteomyelitis right forefoot.  Patient presented with urinary retention.  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[place])    Neuropathy    Non-restorable tooth 10/2013   multiple   Obesity    Osteomyelitis ([place]) 2014   right foot   Panic attacks    Peripheral neuropathy    Past Surgical History:  Procedure Laterality Date   AMPUTATION     left middle toe   BREAST BIOPSY Right 07/12/2017   Procedure: BREAST BIOPSY;  Surgeon: Aviva Signs, MD;  Location: [facility];  Service: General;  Laterality: Right;   DECOMPRESSIVE LUMBAR LAMINECTOMY LEVEL 1 N/A 12/05/2022   Procedure: DECOMPRESSIVE LUMBAR LAMINECTOMY LUMBAR FOUR;  Surgeon: Consuella Lose, MD;  Location: [facility];  Service: Neurosurgery;  Laterality: N/A;   INNER EAR SURGERY Left    as a child   MULTIPLE EXTRACTIONS WITH ALVEOLOPLASTY N/A 11/17/2013   Procedure: MULTIPLE EXTRACTION WITH ALVEOLOPLASTY;  Surgeon: Gae Bon, DDS;  Location: [facility];  Service: Oral Surgery;  Laterality: N/A;   Social History   Socioeconomic History   Marital status: Single    Spouse name: Not on file   Number of children: Not on file   Years of education: Not on file   Highest education level: Not on file  Occupational History   Not on file   Tobacco Use   Smoking status: Every Day    Packs/day: 1.00    Years: 26.00    Total pack years: 26.00    Types: Cigarettes   Smokeless tobacco: Current    Types: Snuff  Vaping Use   Vaping Use: Never used  Substance and Sexual Activity   Alcohol use: Yes    Comment: occasionally   Drug use: No    Types: Cocaine, Marijuana    Comment: prior use   Sexual activity: Never    Birth control/protection: None  Other Topics Concern   Not on file  Social History Narrative   Not on file   Social Determinants of Health   Financial Resource Strain: Not on file  Food Insecurity: Food Insecurity Present (12/04/2022)   Hunger Vital Sign    Worried About Running Out of Food in the Last Year: Often true    Ran Out of Food in the Last Year: Often true  Transportation Needs: Unmet Transportation Needs (12/04/2022)   PRAPARE - Hydrologist (Medical): Yes    Lack of Transportation (Non-Medical): Yes  Physical Activity: Not on file  Stress: Not on file  Social Connections: Not on file   History reviewed. No pertinent family history. - negative except otherwise stated in the family history section Allergies  Allergen Reactions   Darvocet [  Propoxyphene N-Acetaminophen] Hives and Itching   Tramadol Hives and Itching   Other Hives   Prior to Admission medications   Medication Sig Start Date End Date Taking? Authorizing Provider  ibuprofen (ADVIL,MOTRIN) 600 MG tablet Take 1 tablet (600 mg total) by mouth 4 (four) times daily. Patient taking differently: Take 600 mg by mouth every 8 (eight) hours as needed for mild pain. 06/27/17  Yes Lily Kocher, PA-C  albuterol (PROVENTIL HFA;VENTOLIN HFA) 108 (90 BASE) MCG/ACT inhaler Inhale into the lungs every 6 (six) hours as needed for wheezing or shortness of breath. Patient not taking: Reported on 12/03/2022    [provider]  buprenorphine-naloxone (SUBOXONE) 2-0.5 mg SUBL SL tablet Place 1 tablet under the  tongue 2 (two) times daily. Patient not taking: Reported on 12/03/2022    [provider]  gabapentin (NEURONTIN) 300 MG capsule Take 2 capsules (600 mg total) by mouth 3 (three) times daily. Patient not taking: Reported on 12/03/2022 11/12/17   Trula Slade, DPM  sertraline (ZOLOFT) 50 MG tablet Take 25 mg by mouth daily.  Patient not taking: Reported on 12/03/2022    [provider]  tiZANidine (ZANAFLEX) 4 MG tablet Take 4 mg by mouth every 6 (six) hours as needed for muscle spasms. Patient not taking: Reported on 12/03/2022    [provider]   MR FOOT RIGHT WO CONTRAST  Result Date: 12/07/2022 CLINICAL DATA:  Foot swelling, diabetic, osteomyelitis suspected. EXAM: MRI OF THE RIGHT FOREFOOT WITHOUT CONTRAST TECHNIQUE: Multiplanar, multisequence MR imaging of the right forefoot was performed. No intravenous contrast was administered. COMPARISON:  Radiographs dated September 28, 2017 FINDINGS: Bones/Joint/Cartilage Bone marrow edema of the fourth metatarsal shaft and head. Bone marrow edema of the head of the second and third metatarsals with surrounding inflammatory changes concerning for osteomyelitis. Hallux valgus deformity as well as hammertoe deformities of the digits. Ligaments Lisfranc ligament is intact. Evaluation of collateral ligaments of the foot is significantly limited. Soft tissues There is a fluid collection about the plantar aspect of the second and third metatarsal heads which extends to the dorsal aspect of the foot measuring approximately 4.4 x 2.2 x 2.3 cm consistent with abscess. There is soft tissue edema about this abscess. Muscles and Tendons There is marked edema about the plantar tendons with edema and inflammatory changes extends up to the above-mentioned abscess, suggesting infectious tenosynovitis. IMPRESSION: 1. Fluid collection about the plantar aspect of the second and third metatarsal heads which extends to the dorsal aspect of the foot  measuring approximately 4.4 x 2.2 x 2.3 cm consistent with abscess. 2. Bone marrow edema of the head of the second and third metatarsals with surrounding inflammatory changes concerning for osteomyelitis. 3. Bone marrow edema of the fourth metatarsal shaft and head concerning for osteomyelitis. 4. Marked edema about the plantar tendons with edema and inflammatory changes extending up to the above-mentioned abscess, suggesting infectious tenosynovitis. 5. Hallux valgus deformity as well as hammertoe deformities of the digits. Electronically Signed   By: Keane Police D.O.   On: 12/07/2022 20:22   - pertinent xrays, CT, MRI studies were reviewed and independently interpreted  Positive ROS: All other systems have been reviewed and were otherwise negative with the exception of those mentioned in the HPI and as above.  Physical Exam: General: Alert, no acute distress Psychiatric: Patient is competent for consent with normal mood and affect Lymphatic: No axillary or cervical lymphadenopathy Cardiovascular: No pedal edema Respiratory: No cyanosis, no use of accessory musculature  GI: No organomegaly, abdomen is soft and non-tender    Images:  @ENCIMAGES @  Labs:  Lab Results  Component Value Date   HGBA1C 5.0 08/29/2016   HGBA1C 5.6 02/06/2016   HGBA1C 5.5 12/27/2015   ESRSEDRATE 83 (H) 12/05/2022   ESRSEDRATE 1 08/29/2016   ESRSEDRATE 1 02/17/2016   CRP 9.7 (H) 12/05/2022   CRP <0.5 08/29/2016   CRP <0.5 02/17/2016   REPTSTATUS PENDING 12/06/2022   GRAMSTAIN  12/05/2022    RARE WBC PRESENT, PREDOMINANTLY PMN NO ORGANISMS SEEN Performed at [facility] [address] [place], [place] [address]    CULT  12/06/2022    NO GROWTH 3 DAYS Performed at [facility] [address], [place], [place] [address]    LABORGA METHICILLIN RESISTANT STAPHYLOCOCCUS AUREUS 12/05/2022    Lab Results  Component Value Date   ALBUMIN 1.8 (L) 12/09/2022   ALBUMIN 2.0 (L) 12/08/2022    ALBUMIN 2.0 (L) 12/07/2022        Latest Ref Rng & Units 12/09/2022    2:01 AM 12/08/2022    2:24 AM 12/07/2022    3:11 AM  CBC EXTENDED  WBC 4.0 - 10.5 K/uL 20.8  22.5  25.1   RBC 4.22 - 5.81 MIL/uL 3.25  3.50  3.62   Hemoglobin 13.0 - 17.0 g/dL 9.5  10.2  10.4   HCT 39.0 - 52.0 % 28.5  30.7  32.1   Platelets 150 - 400 K/uL 274  283  289     Neurologic: Patient does not have protective sensation bilateral lower extremities.   MUSCULOSKELETAL:   Skin: Examination patient has a petechial rash on the upper and lower extremities.  There is no ascending cellulitis from the right foot he has an ulcer that extends down to bone on the forefoot.  Patient has a palpable dorsalis pedis and posterior tibial pulse bilaterally.  Review of the MRI scan of the left foot shows no abscess or bone infection.  Review of the MRI scan of the right foot shows an abscess across the forefoot with osteomyelitis of the second and third metatarsal heads.  Radiographs shows chronic destructive changes of the second metatarsal.  White cell count 20.8 down from 25.1.  Hemoglobin 9.6.  Albumin 1.8.  Hemoglobin A1c normal years ago.  On admission sed rate 83 with a C-reactive protein of 9.7.  Assessment: Assessment: Abscess osteomyelitis right forefoot.  Plan: Plan: Will plan for a right midfoot amputation.  Risk and benefits were discussed including risk of the wound not healing need for additional surgery the importance of nonweightbearing for 3 weeks.  Patient states he understands wished to proceed at this time.  Thank you for the consult and the opportunity to see Hunter Reyes, [place] [phone] 9:02 AM

## 2022-12-09 NOTE — Progress Notes (Signed)
New order received due to pt now post op. Pt already on caseload and we will continue to follow.  [place] Circle, OTR/L Acute Rehab [facility] [phone] Office [phone]

## 2022-12-09 NOTE — Anesthesia Procedure Notes (Signed)
Procedure Name: MAC Date/Time: 12/09/2022 9:55 AM  Performed by: Alain Marion, CRNAPre-anesthesia Checklist: Patient identified, Suction available, Patient being monitored, Emergency Drugs available and Timeout performed Oxygen Delivery Method: Simple face mask Placement Confirmation: positive ETCO2

## 2022-12-09 NOTE — Transfer of Care (Signed)
Immediate Anesthesia Transfer of Care Note  Patient: Hunter Reyes  Procedure(s) Performed: IRRIGATION AND DEBRIDEMENT OF FOOT  Patient Location: PACU  Anesthesia Type:MAC combined with regional for post-op pain  Level of Consciousness: awake, alert , and oriented  Airway & Oxygen Therapy: Patient Spontanous Breathing and Patient connected to nasal cannula oxygen  Post-op Assessment: Report given to RN and Post -op Vital signs reviewed and stable  Post vital signs: Reviewed and stable  Last Vitals:  Vitals Value Taken Time  BP 99/58 12/09/22 1045  Temp 36.7 C 12/09/22 1039  Pulse 88 12/09/22 1048  Resp 11 12/09/22 1048  SpO2 97 % 12/09/22 1048  Vitals shown include unvalidated device data.  Last Pain:  Vitals:   12/09/22 1039  TempSrc:   PainSc: Asleep         Complications: No notable events documented.

## 2022-12-10 ENCOUNTER — Encounter (HOSPITAL_COMMUNITY): Payer: Self-pay | Admitting: Orthopedic Surgery

## 2022-12-10 DIAGNOSIS — F1910 Other psychoactive substance abuse, uncomplicated: Secondary | ICD-10-CM | POA: Diagnosis not present

## 2022-12-10 DIAGNOSIS — B9562 Methicillin resistant Staphylococcus aureus infection as the cause of diseases classified elsewhere: Secondary | ICD-10-CM | POA: Diagnosis not present

## 2022-12-10 DIAGNOSIS — R7881 Bacteremia: Secondary | ICD-10-CM | POA: Diagnosis not present

## 2022-12-10 DIAGNOSIS — M4647 Discitis, unspecified, lumbosacral region: Secondary | ICD-10-CM | POA: Diagnosis not present

## 2022-12-10 DIAGNOSIS — L02611 Cutaneous abscess of right foot: Secondary | ICD-10-CM | POA: Diagnosis not present

## 2022-12-10 LAB — CBC
HCT: 25.7 % — ABNORMAL LOW (ref 39.0–52.0)
Hemoglobin: 8.6 g/dL — ABNORMAL LOW (ref 13.0–17.0)
MCH: 29.3 pg (ref 26.0–34.0)
MCHC: 33.5 g/dL (ref 30.0–36.0)
MCV: 87.4 fL (ref 80.0–100.0)
Platelets: 280 10*3/uL (ref 150–400)
RBC: 2.94 MIL/uL — ABNORMAL LOW (ref 4.22–5.81)
RDW: 14 % (ref 11.5–15.5)
WBC: 15.2 10*3/uL — ABNORMAL HIGH (ref 4.0–10.5)
nRBC: 0 % (ref 0.0–0.2)

## 2022-12-10 LAB — AEROBIC/ANAEROBIC CULTURE W GRAM STAIN (SURGICAL/DEEP WOUND)

## 2022-12-10 LAB — RENAL FUNCTION PANEL
Albumin: 1.7 g/dL — ABNORMAL LOW (ref 3.5–5.0)
Anion gap: 9 (ref 5–15)
BUN: 54 mg/dL — ABNORMAL HIGH (ref 6–20)
CO2: 23 mmol/L (ref 22–32)
Calcium: 7.9 mg/dL — ABNORMAL LOW (ref 8.9–10.3)
Chloride: 106 mmol/L (ref 98–111)
Creatinine, Ser: 1.6 mg/dL — ABNORMAL HIGH (ref 0.61–1.24)
GFR, Estimated: 54 mL/min — ABNORMAL LOW (ref >60)
Glucose, Bld: 104 mg/dL — ABNORMAL HIGH (ref 70–99)
Phosphorus: 5 mg/dL — ABNORMAL HIGH (ref 2.5–4.6)
Potassium: 3.7 mmol/L (ref 3.5–5.1)
Sodium: 138 mmol/L (ref 135–145)

## 2022-12-10 NOTE — Progress Notes (Signed)
Physical Therapy Treatment Patient Details Name: Hunter Reyes MRN: [id number] DOB: 26-May-1976 Today's Date: 12/10/2022   History of Present Illness 46 y.o. male admitted 12/10 with AMS, urinary retention and low back pain. Underwent L4 lumbar laminectomy for epidural abscess 12/13. 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 PMH:  bipolar 1 disorder, borderline personality disorder, COPD, crack cocaine abuse, depression, peripheral neuropathy, history of right foot osteomyelitis, panic attack and history of seizures    PT Comments    Pt is now s/p TM amputation 12/17 on right foot. Session focused on education on new NWB status and functional mobility. Requires Min A for bed mobility and mod A of 1-2 for standing due to new WB status. Pt with difficulty maintaining NWB of RLE in standing and with transfers. Placing full weight on RLE for transfers to [facility] and chair. Will likely need to focus on lateral scoot transfers for the next 3 weeks until pt is allowed to place weight through RLE. HR up to 130s bpm with activity. Discharge recommendation updated to AIR as pt having difficulty with mobility since surgery and unable to mobilize without placing weight through RLE. Pt needs to be Mod I at w/c level to return home due to lack of support. Will follow acutely to maximize independence and mobility prior to return home.   Recommendations for follow up therapy are one component of a multi-disciplinary discharge planning process, led by the attending physician.  Recommendations may be updated based on patient status, additional functional criteria and insurance authorization.  Follow Up Recommendations  Acute inpatient rehab (3hours/day)     Assistance Recommended at Discharge Frequent or constant Supervision/Assistance  Patient can return home with the following Assistance with cooking/housework;Assist for transportation;Help with stairs or ramp for entrance;A lot of help with walking and/or transfers;A little  help with bathing/dressing/bathroom   Equipment Recommendations  Rolling walker (2 wheels);BSC/3in1 (drop arm BSC)    Recommendations for Other Services       Precautions / Restrictions Precautions Precautions: Fall;Back Precaution Booklet Issued: No (Reviewed with good adherence to back precautions, able to state precautions for RLE however unable to adhere) Precaution Comments: watch HR, wound vac Required Braces or Orthoses: Other Brace Other Brace: post op shoe Restrictions Weight Bearing Restrictions: Yes RLE Weight Bearing: Non weight bearing Other Position/Activity Restrictions: don darco shoe     Mobility  Bed Mobility Overal bed mobility: Needs Assistance Bed Mobility: Rolling, Sidelying to Sit Rolling: Min guard Sidelying to sit: HOB elevated, Min assist       General bed mobility comments: Increased time and reminders for log roll technique. Use of rail and Min A for trunk reaching out for therapist.    Transfers Overall transfer level: Needs assistance Equipment used: Rolling walker (2 wheels) Transfers: Sit to/from Stand Sit to Stand: Mod assist, +2 physical assistance           General transfer comment: Mod A of 1-2 for standing from EOB x2, from [facility] x1 (more assist needed from [facility]) with cues for hand placement/technique and to kick out RLE to avoid placing weight through it. Pt non compliant with NWB status throughout despite cues. Performed SPT bed to [facility] and BSC to chair placing full weight on RLE.    Ambulation/Gait               General Gait Details: Deferred as pt unable to maintain NWb RLE.   Stairs             Emergency planning/management officer  Modified Rankin (Stroke Patients Only)       Balance Overall balance assessment: Needs assistance Sitting-balance support: Feet supported, Bilateral upper extremity supported Sitting balance-[name] Scale: Fair Sitting balance - Comments: Prefers UE support sitting EOB as position of  comfort.   Standing balance support: During functional activity Standing balance-[name] Scale: Poor Standing balance comment: bil Ue support in standing but unable to keep weight off RLE. total A for pericare                            Cognition Arousal/Alertness: Awake/alert Behavior During Therapy: WFL for tasks assessed/performed Overall Cognitive Status: Impaired/Different from baseline Area of Impairment: Safety/judgement                         Safety/Judgement: Decreased awareness of safety, Decreased awareness of deficits     General Comments: Pt understands WB status however unable to comply during mobility. Also reminders for log roll technique for back precautions. Joking and appropriate during session.        Exercises      General Comments General comments (skin integrity, edema, etc.): HR up to 130s bpm with mobility.      Pertinent Vitals/Pain Pain Assessment Pain Assessment: 0-10 Pain Score: 8  Pain Location: back, foot Pain Descriptors / Indicators: Sore, Operative site guarding, Grimacing, Aching Pain Intervention(s): Monitored during session, Repositioned, Limited activity within patient's tolerance    Home Living Family/patient expects to be discharged to:: Private residence Living Arrangements: Alone   Type of Home: House Home Access: Stairs to enter Entrance Stairs-Rails: Psychiatric nurse of Steps: 2   Home Layout: One level Home Equipment: Wheelchair - manual      Prior Function            PT Goals (current goals can now be found in the care plan section) Progress towards PT goals: Progressing toward goals    Frequency    Min 5X/week      PT Plan Discharge plan needs to be updated    Co-evaluation              AM-PAC PT "6 Clicks" Mobility   Outcome Measure  Help needed turning from your back to your side while in a flat bed without using bedrails?: A Little Help needed moving  from lying on your back to sitting on the side of a flat bed without using bedrails?: A Little Help needed moving to and from a bed to a chair (including a wheelchair)?: A Lot Help needed standing up from a chair using your arms (e.g., wheelchair or bedside chair)?: A Lot Help needed to walk in hospital room?: Total Help needed climbing 3-5 steps with a railing? : Total 6 Click Score: 12    End of Session Equipment Utilized During Treatment: Gait belt Activity Tolerance: Patient tolerated treatment well;Patient limited by pain Patient left: in chair;with call bell/phone within reach;with chair alarm set Nurse Communication: Mobility status PT Visit Diagnosis: Muscle weakness (generalized) (M62.81);Pain;Difficulty in walking, not elsewhere classified (R26.2) Pain - Right/Left: Right Pain - part of body: Ankle and joints of foot (back)     Time: 3151-7616 PT Time Calculation (min) (ACUTE ONLY): 42 min  Charges:  $Therapeutic Activity: 23-37 mins                     Marisa Severin, PT, DPT Acute Rehabilitation Services Secure chat preferred Office [phone]  Marguarite Arbour A Dorothymae Maciver 12/10/2022, 1:47 PM

## 2022-12-10 NOTE — Plan of Care (Signed)
  Problem: Fluid Volume: Goal: Hemodynamic stability will improve Outcome: Progressing   Problem: Clinical Measurements: Goal: Diagnostic test results will improve Outcome: Progressing Goal: Signs and symptoms of infection will decrease Outcome: Progressing   Problem: Respiratory: Goal: Ability to maintain adequate ventilation will improve Outcome: Progressing   Problem: Education: Goal: Knowledge of General Education information will improve Description: Including pain rating scale, medication(s)/side effects and non-pharmacologic comfort measures Outcome: Progressing   Problem: Health Behavior/Discharge Planning: Goal: Ability to manage health-related needs will improve Outcome: Progressing   Problem: Clinical Measurements: Goal: Ability to maintain clinical measurements within normal limits will improve Outcome: Progressing Goal: Will remain free from infection Outcome: Progressing Goal: Diagnostic test results will improve Outcome: Progressing Goal: Respiratory complications will improve Outcome: Progressing Goal: Cardiovascular complication will be avoided Outcome: Progressing   Problem: Activity: Goal: Risk for activity intolerance will decrease Outcome: Progressing   Problem: Nutrition: Goal: Adequate nutrition will be maintained Outcome: Progressing   Problem: Coping: Goal: Level of anxiety will decrease Outcome: Progressing   Problem: Elimination: Goal: Will not experience complications related to bowel motility Outcome: Progressing Goal: Will not experience complications related to urinary retention Outcome: Progressing   Problem: Pain Managment: Goal: General experience of comfort will improve Outcome: Progressing   Problem: Safety: Goal: Ability to remain free from injury will improve Outcome: Progressing   Problem: Skin Integrity: Goal: Risk for impaired skin integrity will decrease Outcome: Progressing   Problem: Education: Goal:  Knowledge of the prescribed therapeutic regimen will improve Outcome: Progressing Goal: Ability to verbalize activity precautions or restrictions will improve Outcome: Progressing Goal: Understanding of discharge needs will improve Outcome: Progressing   Problem: Activity: Goal: Ability to perform//tolerate increased activity and mobilize with assistive devices will improve Outcome: Progressing   Problem: Clinical Measurements: Goal: Postoperative complications will be avoided or minimized Outcome: Progressing   Problem: Self-Care: Goal: Ability to meet self-care needs will improve Outcome: Progressing   Problem: Self-Concept: Goal: Ability to maintain and perform role responsibilities to the fullest extent possible will improve Outcome: Progressing   Problem: Pain Management: Goal: Pain level will decrease with appropriate interventions Outcome: Progressing   Problem: Skin Integrity: Goal: Demonstration of wound healing without infection will improve Outcome: Progressing

## 2022-12-10 NOTE — Progress Notes (Signed)
Patient ID: Hunter Reyes, male   DOB: 06-10-1976, 46 y.o.   MRN: [id number] Patient is postoperative day 1 transmetatarsal amputation.  Wound margins were clear.  There is no drainage in the wound VAC canister.  Discussed with the patient the importance of minimizing weightbearing on the operative foot.  Patient may discharge once he is safe with therapy.

## 2022-12-10 NOTE — Progress Notes (Signed)
Inpatient Rehab Admissions Coordinator:   Per PT note,  patient was screened for CIR candidacy by Clemens Catholic, MS, CCC-SLP.  Note that pt. To require 6-8 weeks of IV ABX (per ID note 12/14) and with history of polysubstance abuse, cannot d/c with  a PICC for access. Pt. Likely to require only 2 weeks on CIR, so I am not able to offer a bed at this time. Can reconsider once nearer the end of his course of IV ABX.    Clemens Catholic, [place], [place] Admissions Coordinator  [phone] ([place]) [phone] (office)

## 2022-12-10 NOTE — Progress Notes (Signed)
[facility]  Date of Admission:  12/02/2022           Reason for visit: Follow up on MRSA bacteremia  Current antibiotics: Daptomycin  ASSESSMENT:    46 y.o. male admitted with:  #MRSA bacteremia: Blood cultures initially positive on 12/03/2022 with clearance on 12/06/2022.  TTE was negative for vegetation.  #Discitis/osteomyelitis and epidural abscess status post decompression and debridement of epidural abscess on 12/05/2022 with neurosurgery.  Operative cultures also grew MRSA.  #Right foot osteomyelitis and abscess: Status post TMA with Dr. Sharol Given 12/09/2022.  Operative margins were clear.  #History of substance use  RECOMMENDATIONS:    Continue daptomycin Follow repeat blood cultures from 12/14 to ensure clearance Hold off on PICC line for now.  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 ([facility])   Cutaneous abscess of right foot    MEDICATIONS:    Scheduled Meds:  vitamin C  1,000 mg Oral Daily   buprenorphine-naloxone  1 tablet Sublingual BID   Chlorhexidine Gluconate Cloth  6 each Topical Q0600   docusate sodium  100 mg Oral Daily   enoxaparin (LOVENOX) injection  75 mg Subcutaneous Q24H   gabapentin  600 mg Oral BID   LORazepam  1 mg Intravenous Once   nutrition supplement (JUVEN)  1 packet Oral BID BM   pantoprazole  40 mg Oral Daily   polyethylene glycol  17 g Oral Daily   sertraline  25 mg Oral Daily   sodium zirconium cyclosilicate  10 g Oral Once   thiamine  500 mg Oral Daily   zinc sulfate  220 mg Oral Daily   Continuous Infusions:  sodium chloride 75  mL/hr at 12/10/22 0344   DAPTOmycin (CUBICIN) 850 mg in sodium chloride 0.9 % IVPB 850 mg (12/09/22 2050)   lactated ringers     magnesium sulfate bolus IVPB     PRN Meds:.acetaminophen **OR** acetaminophen, acetaminophen, albuterol, alum & mag hydroxide-simeth, bisacodyl, guaiFENesin-dextromethorphan, hydrALAZINE, HYDROmorphone (DILAUDID) injection, labetalol, magnesium citrate, magnesium sulfate bolus IVPB, metoprolol tartrate, ondansetron **OR** ondansetron (ZOFRAN) IV, ondansetron, oxyCODONE, oxyCODONE, phenol, polyethylene glycol, potassium chloride, tiZANidine, traZODone  SUBJECTIVE:   24 hour events:  Status post right TMA Afebrile WBC improved 12/14 blood cultures remain negative Currently on daptomycin  No new complaints.  Reports that he is hungry.  States his surgery went well yesterday.  He is able to move his lower extremities.  Review of Systems  All other systems reviewed and are negative.     OBJECTIVE:   Blood pressure (!) 110/54, pulse 76, temperature 98.3 F (36.8 C), temperature source Oral, resp. rate 10, height 6\' 2"  (1.88 m), weight (!) 149 kg, SpO2 94 %. Body mass index is 42.18 kg/m.  Physical Exam Constitutional:      General: He is not in acute distress. HENT:     Head: Normocephalic and atraumatic.  Eyes:     Extraocular Movements: Extraocular movements intact.     Conjunctiva/sclera: Conjunctivae normal.  Pulmonary:     Effort: Pulmonary effort is normal. No respiratory distress.  Abdominal:     General: There is no distension.     Palpations: Abdomen is soft.  Musculoskeletal:     Cervical back: Normal range of motion and neck supple.     Right lower leg: No edema.     Left lower leg: No edema.     Comments: Status post right TMA  Skin:    Findings: Rash present.  Neurological:     General: No focal deficit present.     Mental Status: He is alert and oriented to person, place, and time.  Psychiatric:        Mood and Affect: Mood  normal.        Behavior: Behavior normal.      Lab Results: Lab Results  Component Value Date   WBC 15.2 (H) 12/10/2022   HGB 8.6 (L) 12/10/2022   HCT 25.7 (L) 12/10/2022   MCV 87.4 12/10/2022   PLT 280 12/10/2022    Lab Results  Component Value Date   NA 138 12/10/2022   K 3.7 12/10/2022   CO2 23 12/10/2022   GLUCOSE 104 (H) 12/10/2022   BUN 54 (H) 12/10/2022   CREATININE 1.60 (H) 12/10/2022   CALCIUM 7.9 (L) 12/10/2022   GFRNONAA 54 (L) 12/10/2022   GFRAA >60 09/28/2017    Lab Results  Component Value Date   ALT 15 12/03/2022   AST 17 12/03/2022   ALKPHOS 99 12/03/2022   BILITOT 1.0 12/03/2022       Component Value Date/Time   CRP 9.7 (H) 12/05/2022 1131       Component Value Date/Time   ESRSEDRATE 83 (H) 12/05/2022 1131     I have reviewed the micro and lab results in Epic.  Imaging: No results found.   Imaging independently reviewed in Epic.    [facility] [phone] pager 12/10/2022, 10:15 AM  I have personally spent 50 minutes involved in face-to-face and non-face-to-face activities for this patient on the day of the visit. Professional time spent includes the following activities: Preparing to see the patient (review of tests), Obtaining and/or reviewing separately obtained history (admission/discharge record), Performing a medically appropriate examination and/or evaluation , Ordering medications/tests/procedures, referring and communicating with other health care professionals, Documenting clinical information in the EMR, Independently interpreting results (not separately reported), Communicating results to the patient/family/caregiver, Counseling and educating the patient/family/caregiver and Care coordination (not separately reported).

## 2022-12-10 NOTE — Progress Notes (Signed)
PROGRESS NOTE    UDELL MAZZOCCO  AUQ:[id number] DOB: Jul 09, 1976 DOA: 12/02/2022 PCP: Celene Squibb, MD     Chief Complaint  Patient presents with   Urinary Retention   Back Pain    Brief Narrative:  NICOLAOS MITRANO is a 46 y.o. Caucasian male with medical history significant for bipolar 1 disorder, borderline personality disorder, COPD, crack cocaine abuse, depression, peripheral neuropathy, history of right foot osteomyelitis, panic attack and history of seizures, who presented to the emergency room with acute onset of altered mental status with associated urinary retention and low back pain.  The patient has not urinated for 3 days.  Hlabs hyperkalemia 5.8, BUN more than 300 and creatinine 20.99 with calcium of 8.8 anion gap of 26, albumin of 2.7 and total protein of 8.9.  Serum lipase was 222.  Lactic acid was 1.2 and CK 40.  CBC showed leukocytosis of 32.2 with neutrophilia and thrombocytosis of 411 with mild anemia with hemoglobin of 12.8 hematocrit 27.1.  Previous BUN and creatinine were 8 and 0.95 however on 09/28/2017.  Assessment & Plan:   Principal Problem:   MRSA bacteremia Active Problems:   AKI (acute kidney injury) ([place])   Diskitis   Bipolar 1 disorder ([place])   Peripheral neuropathy   Polysubstance abuse (HCC)   Metabolic acidosis   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 (HCC)   Cutaneous abscess of right foot    AKI (acute kidney injury) ([place]) Urinary retention - his is likely prerenal due to severe dehydration as well as obstructive uropathy and the use of nephrotoxic agents (NSAIDs). -At time of admission BUN about 300 and creatinine more than 20. -After Foley catheter placed and fluid resuscitation provided  -Function continues to improve, creatinine is 1.45 today, -Continue to monitor electrolyte closely for postobstructive diuresis - continue with flomax -Continue with IV fluid, avoid nephrotoxic medications.  Hyperkalemia  - Due to AKI,  resolved with Lokelma  Hypernatremia Hyperchloremia Metabolic acidosis  -This has resolved with bicarb drip.  Diskitis/MRSA bacteremia/epidural abscess -Discitis as evident on imaging -Patient appears with lower extremity weakness, urinary retention, possible incontinence, unclear acuity, as report last time he was able to stand up was a month ago -Management per ID, vancomycin has been switched to daptomycin -  sp Right L4 laminotomy, sublaminar decompression with debridement of ventral epidural abscess  by Dr. Kathyrn Sheriff  -Concern of both feet having osteomyelitis, could be the source, MRI is pending. -TEE with no vegetation, will not need TEE as it will not change management  -PICC line once cleared his bacteremia, will not be a candidate for OPAT at home, likely will need placement   Right foot abscess/to myelitis/tenosynovitis -Status post TMA by Dr. Sharol Given 12/17  Rash -appears to be improving   Peripheral neuropathy -Continue Neurontin and adjusted dose   Bipolar 1 disorder ([place]) -No suicidal ideation or hallucination -Stable mood currently -Continue Abilify-antidepressant therapy.   Polysubstance abuse ([place]) -This includes cocaine and likely opiates. -Continue the use of Suboxone   Bilateral heel ulcers POA  - cont with wound care, MRI of both feet tomorrow.    Hypomagnesemia Hypophosphatemia -Repleted  Hepatitis B infection -ID is following    DVT prophylaxis: Lovenox Code Status: Full Family Communication: None at bedside Disposition:   Status is: Inpatient    Consultants:  ID Neurosurgery Orthopedic Dr. Sharol Given  Subjective:  No significant events overnight, he denies any complaints today Objective: Vitals:   12/10/22 0335 12/10/22 0336 12/10/22 0400 12/10/22 0800  BP:   (!) 111/57 (!) 110/54  Pulse: 98 85 77 76  Resp: 13 (!) 8 10 10   Temp:  98.2 F (36.8 C) 98.5 F (36.9 C) 98.3 F (36.8 C)  TempSrc:  Oral Oral Oral  SpO2: 96% 94% 92% 94%   Weight:      Height:        Intake/Output Summary (Last 24 hours) at 12/10/2022 1625 Last data filed at 12/10/2022 1136 Gross per 24 hour  Intake 969 ml  Output 3800 ml  Net -2831 ml   Filed Weights   12/02/22 2230  Weight: (!) 149 kg    Examination:   Awake Alert, Oriented X 3, appropriate and coherent today, pleasant Symmetrical Chest wall movement, Good air movement bilaterally, CTAB RRR,No Gallops,Rubs or new Murmurs, No Parasternal Heave +ve B.Sounds, Abd Soft, No tenderness, No rebound - guarding or rigidity. No Cyanosis, Clubbing or edema,  Patient with progressive maculopapular rash extending today to the extremities, please see picture below.             Data Reviewed: I have personally reviewed following labs and imaging studies  CBC: Recent Labs  Lab 12/06/22 0451 12/07/22 0311 12/08/22 0224 12/09/22 0201 12/10/22 0453  WBC 34.3* 25.1* 22.5* 20.8* 15.2*  HGB 11.1* 10.4* 10.2* 9.5* 8.6*  HCT 33.4* 32.1* 30.7* 28.5* 25.7*  MCV 88.1 88.7 87.7 87.7 87.4  PLT 321 289 283 274 009    Basic Metabolic Panel: Recent Labs  Lab 12/06/22 0451 12/06/22 1509 12/07/22 0314 12/08/22 0255 12/09/22 0211 12/10/22 0453  NA 148* 145 140 139 134* 138  K 5.4* 3.6 3.8 3.4* 3.9 3.7  CL 124* 119* 111 107 102 106  CO2 15* 18* 19* 23 20* 23  GLUCOSE 125* 139* 96 92 96 104*  BUN 61* 53* 45* 40* 52* 54*  CREATININE 1.48* 1.20 1.30* 1.45* 1.85* 1.60*  CALCIUM 8.3* 8.2* 7.9* 8.0* 7.8* 7.9*  PHOS 4.6  --  3.6 4.0 5.4* 5.0*    GFR: Estimated Creatinine Clearance: 89.8 mL/min (A) (by C-G formula based on SCr of 1.6 mg/dL (H)).  Liver Function Tests: Recent Labs  Lab 12/06/22 0451 12/07/22 0314 12/08/22 0255 12/09/22 0211 12/10/22 0453  ALBUMIN 2.2* 2.0* 2.0* 1.8* 1.7*    CBG: Recent Labs  Lab 12/06/22 2103  GLUCAP 110*     Recent Results (from the past 240 hour(s))  Resp Panel by RT-PCR (Flu A&B, Covid) Anterior Nasal Swab     Status: None    Collection Time: 12/02/22 11:30 PM   Specimen: Anterior Nasal Swab  Result Value Ref Range Status   SARS Coronavirus 2 by RT PCR NEGATIVE NEGATIVE Final    Comment: (NOTE) SARS-CoV-2 target nucleic acids are NOT DETECTED.  The SARS-CoV-2 RNA is generally detectable in upper respiratory specimens during the acute phase of infection. The lowest concentration of SARS-CoV-2 viral copies this assay can detect is 138 copies/mL. A negative result does not preclude SARS-Cov-2 infection and should not be used as the sole basis for treatment or other patient management decisions. A negative result may occur with  improper specimen collection/handling, submission of specimen other than nasopharyngeal swab, presence of viral mutation(s) within the areas targeted by this assay, and inadequate number of viral copies(<138 copies/mL). A negative result must be combined with clinical observations, patient history, and epidemiological information. The expected result is Negative.  Fact Sheet for Patients:  EntrepreneurPulse.com.au  Fact Sheet for Healthcare Providers:  IncredibleEmployment.be  This test is no t  yet approved or cleared by the [place] and  has been authorized for detection and/or diagnosis of SARS-CoV-2 by FDA under an Emergency Use Authorization (EUA). This EUA will remain  in effect (meaning this test can be used) for the duration of the COVID-19 declaration under Section 564(b)(1) of the Act, 21 U.S.C.section 360bbb-3(b)(1), unless the authorization is terminated  or revoked sooner.       Influenza A by PCR NEGATIVE NEGATIVE Final   Influenza B by PCR NEGATIVE NEGATIVE Final    Comment: (NOTE) The Xpert Xpress SARS-CoV-2/FLU/RSV plus assay is intended as an aid in the diagnosis of influenza from Nasopharyngeal swab specimens and should not be used as a sole basis for treatment. Nasal washings and aspirates are unacceptable for  Xpert Xpress SARS-CoV-2/FLU/RSV testing.  Fact Sheet for Patients: EntrepreneurPulse.com.au  Fact Sheet for Healthcare Providers: IncredibleEmployment.be  This test is not yet approved or cleared by the [place] FDA and has been authorized for detection and/or diagnosis of SARS-CoV-2 by FDA under an Emergency Use Authorization (EUA). This EUA will remain in effect (meaning this test can be used) for the duration of the COVID-19 declaration under Section 564(b)(1) of the Act, 21 U.S.C. section 360bbb-3(b)(1), unless the authorization is terminated or revoked.  Performed at [facility], [address], [place], [place] [address]   Blood culture (routine x 2)     Status: Abnormal   Collection Time: 12/03/22  4:00 AM   Specimen: Site Not Specified; Blood  Result Value Ref Range Status   Specimen Description   Final    SITE NOT SPECIFIED BOTTLES DRAWN AEROBIC AND ANAEROBIC Performed at [facility], [address] [place], [place] [address]    Special Requests   Final    Blood Culture adequate volume Performed at [place] [place], [place] [address]    Culture  Setup Time   Final    GRAM POSITIVE COCCI AEB BOTTLES DRAWN AEROBIC ONLY Gram Stain Report Called to,Read Back By and Verified With: REECE THOMPSON,RN @2143  ON 12/04/22 BY SSLANE CRITICAL RESULT CALLED TO, READ BACK BY AND VERIFIED WITH: G ABBOTT,PHARMD@0200  12/05/22 [place] GRAM POSITIVE COCCI ANAEROBIC BOTTLE ONLY RESULT PREVIOUSLY CALLED Performed at [facility], [address] [place], [place] [address]    Culture METHICILLIN RESISTANT STAPHYLOCOCCUS AUREUS (A)  Final   Report Status 12/08/2022 FINAL  Final   Organism ID, Bacteria METHICILLIN RESISTANT STAPHYLOCOCCUS AUREUS  Final      Susceptibility   Methicillin resistant staphylococcus aureus - MIC*    CIPROFLOXACIN >=8 RESISTANT Resistant     ERYTHROMYCIN >=8 RESISTANT Resistant     GENTAMICIN <=0.5  SENSITIVE Sensitive     OXACILLIN >=4 RESISTANT Resistant     TETRACYCLINE <=1 SENSITIVE Sensitive     VANCOMYCIN <=0.5 SENSITIVE Sensitive     TRIMETH/SULFA 20 SENSITIVE Sensitive     CLINDAMYCIN <=0.25 SENSITIVE Sensitive     RIFAMPIN <=0.5 SENSITIVE Sensitive     Inducible Clindamycin NEGATIVE Sensitive     * METHICILLIN RESISTANT STAPHYLOCOCCUS AUREUS  Blood Culture ID Panel (Reflexed)     Status: Abnormal   Collection Time: 12/03/22  4:00 AM  Result Value Ref Range Status   Enterococcus faecalis NOT DETECTED NOT DETECTED Final   Enterococcus Faecium NOT DETECTED NOT DETECTED Final   Listeria monocytogenes NOT DETECTED NOT DETECTED Final   Staphylococcus species DETECTED (A) NOT DETECTED Final    Comment: CRITICAL RESULT CALLED TO, READ BACK BY AND VERIFIED WITH: G  ABBOTT,PHARMD@0200  12/05/22 [place]    Staphylococcus aureus (BCID) DETECTED (A) NOT DETECTED Final    Comment: Methicillin (oxacillin)-resistant Staphylococcus aureus (MRSA). MRSA is predictably resistant to beta-lactam antibiotics (except ceftaroline). Preferred therapy is vancomycin unless clinically contraindicated. Patient requires contact precautions if  hospitalized. CRITICAL RESULT CALLED TO, READ BACK BY AND VERIFIED WITH: G ABBOTT,PHARMD@0200  12/05/22 [place]    Staphylococcus epidermidis NOT DETECTED NOT DETECTED Final   Staphylococcus lugdunensis NOT DETECTED NOT DETECTED Final   Streptococcus species NOT DETECTED NOT DETECTED Final   Streptococcus agalactiae NOT DETECTED NOT DETECTED Final   Streptococcus pneumoniae NOT DETECTED NOT DETECTED Final   Streptococcus pyogenes NOT DETECTED NOT DETECTED Final   A.calcoaceticus-baumannii NOT DETECTED NOT DETECTED Final   Bacteroides fragilis NOT DETECTED NOT DETECTED Final   Enterobacterales NOT DETECTED NOT DETECTED Final   Enterobacter cloacae complex NOT DETECTED NOT DETECTED Final   Escherichia coli NOT DETECTED NOT DETECTED Final   Klebsiella aerogenes NOT DETECTED  NOT DETECTED Final   Klebsiella oxytoca NOT DETECTED NOT DETECTED Final   Klebsiella pneumoniae NOT DETECTED NOT DETECTED Final   Proteus species NOT DETECTED NOT DETECTED Final   Salmonella species NOT DETECTED NOT DETECTED Final   Serratia marcescens NOT DETECTED NOT DETECTED Final   Haemophilus influenzae NOT DETECTED NOT DETECTED Final   Neisseria meningitidis NOT DETECTED NOT DETECTED Final   Pseudomonas aeruginosa NOT DETECTED NOT DETECTED Final   Stenotrophomonas maltophilia NOT DETECTED NOT DETECTED Final   Candida albicans NOT DETECTED NOT DETECTED Final   Candida auris NOT DETECTED NOT DETECTED Final   Candida glabrata NOT DETECTED NOT DETECTED Final   Candida krusei NOT DETECTED NOT DETECTED Final   Candida parapsilosis NOT DETECTED NOT DETECTED Final   Candida tropicalis NOT DETECTED NOT DETECTED Final   Cryptococcus neoformans/gattii NOT DETECTED NOT DETECTED Final   Meth resistant mecA/C and MREJ DETECTED (A) NOT DETECTED Final    Comment: CRITICAL RESULT CALLED TO, READ BACK BY AND VERIFIED WITH: G ABBOTT,PHARMD@0200  12/05/22 [place] Performed at [facility], [address] [place], [place] [address]   Urine Culture     Status: Abnormal   Collection Time: 12/03/22  4:01 AM   Specimen: Urine, Catheterized  Result Value Ref Range Status   Specimen Description   Final    URINE, CATHETERIZED Performed at [facility], [address] [place], [place] [address]    Special Requests   Final    NONE Performed at [facility], [address] [place], [place] [address]    Culture (A)  Final    20,000 COLONIES/mL METHICILLIN RESISTANT STAPHYLOCOCCUS AUREUS   Report Status 12/05/2022 FINAL  Final   Organism ID, Bacteria METHICILLIN RESISTANT STAPHYLOCOCCUS AUREUS (A)  Final      Susceptibility   Methicillin resistant staphylococcus aureus - MIC*    CIPROFLOXACIN >=8 RESISTANT Resistant     GENTAMICIN <=0.5 SENSITIVE Sensitive     NITROFURANTOIN 32 SENSITIVE Sensitive      OXACILLIN >=4 RESISTANT Resistant     TETRACYCLINE <=1 SENSITIVE Sensitive     VANCOMYCIN <=0.5 SENSITIVE Sensitive     TRIMETH/SULFA <=10 SENSITIVE Sensitive     CLINDAMYCIN <=0.25 SENSITIVE Sensitive     RIFAMPIN <=0.5 SENSITIVE Sensitive     Inducible Clindamycin NEGATIVE Sensitive     * 20,000 COLONIES/mL METHICILLIN RESISTANT STAPHYLOCOCCUS AUREUS  Blood culture (routine x 2)     Status: Abnormal   Collection Time: 12/03/22  4:10 AM   Specimen: BLOOD  Result Value Ref  Range Status   Specimen Description BLOOD SITE NOT SPECIFIED  Final   Special Requests   Final    BOTTLES DRAWN AEROBIC AND ANAEROBIC Blood Culture adequate volume   Culture  Setup Time   Final    CRITICAL RESULT CALLED TO, READ BACK BY AND VERIFIED WITH: IN BOTH AEROBIC AND ANAEROBIC BOTTLES GRAM POSITIVE COCCI Gram Stain Report Called to,Read Back By and Verified With: NAKIA MCGOWAN @ 2025 ON 12/05/22 C [place] CRITICAL RESULT CALLED TO, READ BACK BY AND VERIFIED WITH: RN Minna Merritts [facility] ON 12/05/22 @ 1950 BY DRT    Culture (A)  Final    STAPHYLOCOCCUS AUREUS SUSCEPTIBILITIES PERFORMED ON PREVIOUS CULTURE WITHIN THE LAST 5 DAYS. Performed at [facility], [place] [address], [place], [place] [address]    Report Status 12/08/2022 FINAL  Final  Blood Culture ID Panel (Reflexed)     Status: Abnormal   Collection Time: 12/03/22  4:10 AM  Result Value Ref Range Status   Enterococcus faecalis NOT DETECTED NOT DETECTED Final   Enterococcus Faecium NOT DETECTED NOT DETECTED Final   Listeria monocytogenes NOT DETECTED NOT DETECTED Final   Staphylococcus species DETECTED (A) NOT DETECTED Final    Comment: CRITICAL RESULT CALLED TO, READ BACK BY AND VERIFIED WITH: RN Minna Merritts [facility] ON 12/05/22 @ 1950 BY DRT    Staphylococcus aureus (BCID) DETECTED (A) NOT DETECTED Final    Comment: Methicillin (oxacillin)-resistant Staphylococcus aureus (MRSA). MRSA is predictably resistant to beta-lactam antibiotics (except  ceftaroline). Preferred therapy is vancomycin unless clinically contraindicated. Patient requires contact precautions if  hospitalized. CRITICAL RESULT CALLED TO, READ BACK BY AND VERIFIED WITH: RN Minna Merritts [facility] ON 12/05/22 @ 1950 BY DRT    Staphylococcus epidermidis NOT DETECTED NOT DETECTED Final   Staphylococcus lugdunensis NOT DETECTED NOT DETECTED Final   Streptococcus species NOT DETECTED NOT DETECTED Final   Streptococcus agalactiae NOT DETECTED NOT DETECTED Final   Streptococcus pneumoniae NOT DETECTED NOT DETECTED Final   Streptococcus pyogenes NOT DETECTED NOT DETECTED Final   A.calcoaceticus-baumannii NOT DETECTED NOT DETECTED Final   Bacteroides fragilis NOT DETECTED NOT DETECTED Final   Enterobacterales NOT DETECTED NOT DETECTED Final   Enterobacter cloacae complex NOT DETECTED NOT DETECTED Final   Escherichia coli NOT DETECTED NOT DETECTED Final   Klebsiella aerogenes NOT DETECTED NOT DETECTED Final   Klebsiella oxytoca NOT DETECTED NOT DETECTED Final   Klebsiella pneumoniae NOT DETECTED NOT DETECTED Final   Proteus species NOT DETECTED NOT DETECTED Final   Salmonella species NOT DETECTED NOT DETECTED Final   Serratia marcescens NOT DETECTED NOT DETECTED Final   Haemophilus influenzae NOT DETECTED NOT DETECTED Final   Neisseria meningitidis NOT DETECTED NOT DETECTED Final   Pseudomonas aeruginosa NOT DETECTED NOT DETECTED Final   Stenotrophomonas maltophilia NOT DETECTED NOT DETECTED Final   Candida albicans NOT DETECTED NOT DETECTED Final   Candida auris NOT DETECTED NOT DETECTED Final   Candida glabrata NOT DETECTED NOT DETECTED Final   Candida krusei NOT DETECTED NOT DETECTED Final   Candida parapsilosis NOT DETECTED NOT DETECTED Final   Candida tropicalis NOT DETECTED NOT DETECTED Final   Cryptococcus neoformans/gattii NOT DETECTED NOT DETECTED Final   Meth resistant mecA/C and MREJ DETECTED (A) NOT DETECTED Final    Comment: CRITICAL RESULT CALLED TO, READ BACK  BY AND VERIFIED WITH: RN Minna Merritts [facility] ON 12/05/22 @ 1950 BY DRT Performed at [facility], [address], [place], [place] [address]   Culture, blood (Routine X 2) w  Reflex to ID Panel     Status: Abnormal   Collection Time: 12/05/22  9:12 AM   Specimen: BLOOD  Result Value Ref Range Status   Specimen Description BLOOD LEFT ANTECUBITAL  Final   Special Requests   Final    BOTTLES DRAWN AEROBIC AND ANAEROBIC Blood Culture adequate volume   Culture  Setup Time   Final    GRAM POSITIVE COCCI IN CLUSTERS IN BOTH AEROBIC AND ANAEROBIC BOTTLES CRITICAL RESULT CALLED TO, READ BACK BY AND VERIFIED WITH: E. [place], AT 1334 12/06/22 D. [place]ANH[place]    Culture (A)  Final    STAPHYLOCOCCUS SPECIES (COAGULASE NEGATIVE) STAPHYLOCOCCUS AUREUS SUSCEPTIBILITIES PERFORMED ON PREVIOUS CULTURE WITHIN THE LAST 5 DAYS. Performed at [facility], [place] [address] [place], [place] [address]    Report Status 12/08/2022 FINAL  Final   Organism ID, Bacteria STAPHYLOCOCCUS SPECIES (COAGULASE NEGATIVE)  Final      Susceptibility   Staphylococcus species (coagulase negative) - MIC*    CIPROFLOXACIN >=8 RESISTANT Resistant     ERYTHROMYCIN RESISTANT Resistant     GENTAMICIN <=0.5 SENSITIVE Sensitive     OXACILLIN >=4 RESISTANT Resistant     TETRACYCLINE <=1 SENSITIVE Sensitive     VANCOMYCIN <=0.5 SENSITIVE Sensitive     TRIMETH/SULFA <=10 SENSITIVE Sensitive     CLINDAMYCIN RESISTANT Resistant     RIFAMPIN <=0.5 SENSITIVE Sensitive     Inducible Clindamycin POSITIVE Resistant     * STAPHYLOCOCCUS SPECIES (COAGULASE NEGATIVE)  Culture, blood (Routine X 2) w Reflex to ID Panel     Status: Abnormal   Collection Time: 12/05/22  9:15 AM   Specimen: BLOOD LEFT WRIST  Result Value Ref Range Status   Specimen Description BLOOD LEFT WRIST  Final   Special Requests   Final    BOTTLES DRAWN AEROBIC AND ANAEROBIC Blood Culture adequate volume   Culture  Setup Time   Final    GRAM POSITIVE COCCI  IN CLUSTERS AEROBIC BOTTLE ONLY CRITICAL VALUE NOTED.  VALUE IS CONSISTENT WITH PREVIOUSLY REPORTED AND CALLED VALUE.    Culture (A)  Final    STAPHYLOCOCCUS SPECIES (COAGULASE NEGATIVE) SUSCEPTIBILITIES PERFORMED ON PREVIOUS CULTURE WITHIN THE LAST 5 DAYS. Performed at [facility], [place] [address] [place], [place] [address]    Report Status 12/08/2022 FINAL  Final  Aerobic/Anaerobic Culture w Gram Stain (surgical/deep wound)     Status: None   Collection Time: 12/05/22  7:52 PM   Specimen: Abscess  Result Value Ref Range Status   Specimen Description ABSCESS  Final   Special Requests LUMBAR 4 EPIDURAL SPACE  Final   Gram Stain   Final    RARE WBC PRESENT, PREDOMINANTLY PMN NO ORGANISMS SEEN    Culture   Final    RARE METHICILLIN RESISTANT STAPHYLOCOCCUS AUREUS NO ANAEROBES ISOLATED Performed at [facility], [place] [address], [place], [place] [address]    Report Status 12/10/2022 FINAL  Final   Organism ID, Bacteria METHICILLIN RESISTANT STAPHYLOCOCCUS AUREUS  Final      Susceptibility   Methicillin resistant staphylococcus aureus - MIC*    CIPROFLOXACIN >=8 RESISTANT Resistant     ERYTHROMYCIN >=8 RESISTANT Resistant     GENTAMICIN <=0.5 SENSITIVE Sensitive     OXACILLIN >=4 RESISTANT Resistant     TETRACYCLINE <=1 SENSITIVE Sensitive     VANCOMYCIN <=0.5 SENSITIVE Sensitive     TRIMETH/SULFA <=10 SENSITIVE Sensitive     CLINDAMYCIN <=0.25 SENSITIVE Sensitive  RIFAMPIN <=0.5 SENSITIVE Sensitive     Inducible Clindamycin NEGATIVE Sensitive     * RARE METHICILLIN RESISTANT STAPHYLOCOCCUS AUREUS  Culture, blood (Routine X 2) w Reflex to ID Panel     Status: None (Preliminary result)   Collection Time: 12/06/22  3:09 PM   Specimen: BLOOD LEFT HAND  Result Value Ref Range Status   Specimen Description BLOOD LEFT HAND  Final   Special Requests   Final    BOTTLES DRAWN AEROBIC ONLY Blood Culture adequate volume   Culture   Final    NO GROWTH 4 DAYS Performed  at [facility], [place] [address], [place], [place] [address]    Report Status PENDING  Incomplete  Culture, blood (Routine X 2) w Reflex to ID Panel     Status: None (Preliminary result)   Collection Time: 12/06/22  3:10 PM   Specimen: BLOOD RIGHT HAND  Result Value Ref Range Status   Specimen Description BLOOD RIGHT HAND  Final   Special Requests   Final    BOTTLES DRAWN AEROBIC AND ANAEROBIC Blood Culture adequate volume   Culture   Final    NO GROWTH 4 DAYS Performed at [facility], [place] [address], [place], [place] [address]    Report Status PENDING  Incomplete         Radiology Studies: No results found.      Scheduled Meds:  vitamin C  1,000 mg Oral Daily   buprenorphine-naloxone  1 tablet Sublingual BID   Chlorhexidine Gluconate Cloth  6 each Topical Q0600   docusate sodium  100 mg Oral Daily   enoxaparin (LOVENOX) injection  75 mg Subcutaneous Q24H   gabapentin  600 mg Oral BID   LORazepam  1 mg Intravenous Once   nutrition supplement (JUVEN)  1 packet Oral BID BM   pantoprazole  40 mg Oral Daily   polyethylene glycol  17 g Oral Daily   sertraline  25 mg Oral Daily   thiamine  500 mg Oral Daily   zinc sulfate  220 mg Oral Daily   Continuous Infusions:  sodium chloride 75 mL/hr at 12/10/22 0344   DAPTOmycin (CUBICIN) 850 mg in sodium chloride 0.9 % IVPB 850 mg (12/09/22 2050)   lactated ringers     magnesium sulfate bolus IVPB       LOS: 7 days        Phillips Climes, MD [facility]   To contact the attending provider between 7A-7P or the covering provider during after hours 7P-7A, please log into the web site [email or web address] and access using universal [place] password for that web site. If you do not have the password, please call the hospital operator.  12/10/2022, 4:25 PM

## 2022-12-10 NOTE — Evaluation (Signed)
Occupational Therapy Evaluation Patient Details Name: Hunter Reyes MRN: [id number] DOB: [birth year] Today's Date: 12/10/2022   History of Present Illness 46 y.o. male admitted 12/10 with AMS, urinary retention and low back pain. Underwent L4 lumbar laminectomy for epidural abscess 12/13. s/p I &D right foot 12/17. 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 Patient now NWB on RLE, and presenting with need for max cues to maintain NWB status, decreased cognition, and back pain. Patient Mod A of 1-2 for standing from EOB x2, from [facility] x1 (more assist needed from [facility]) with cues for hand placement/technique and to kick out RLE to avoid placing weight through it. Pt non compliant with NWB status throughout despite cues. Performed SPT bed to [facility] and BSC to chair placing full weight on RLE. Due to current level of progress and weight bearing status, patient's goals downgraded to accommodate and OT now recommending SNF. OT will continue to follow.      Recommendations for follow up therapy are one component of a multi-disciplinary discharge planning process, led by the attending physician.  Recommendations may be updated based on patient status, additional functional criteria and insurance authorization.   Follow Up Recommendations  Skilled nursing-short term rehab (<3 hours/day)     Assistance Recommended at Discharge Intermittent Supervision/Assistance  Patient can return home with the following Two people to help with walking and/or transfers;A lot of help with bathing/dressing/bathroom;Assistance with cooking/housework;Help with stairs or ramp for entrance;Assist for transportation    Functional Status Assessment  Patient has had a recent decline  in their functional status and demonstrates the ability to make significant improvements in function in a reasonable and predictable amount of time.  Equipment Recommendations  Other (comment);Wheelchair (measurements OT);Wheelchair cushion (measurements OT);BSC/3in1 (Defer to next venue)    Recommendations for Other Services       Precautions / Restrictions Precautions Precautions: Fall;Back Precaution Booklet Issued: No (Reviewed with good adherence to back precautions, able to state precautions for RLE however unable to adhere) Precaution Comments: watch HR, incontinent stool Required Braces or Orthoses: Other Brace Other Brace: post op shoe Restrictions Weight Bearing Restrictions: Yes RLE Weight Bearing: Non weight bearing Other Position/Activity Restrictions: don darco shoe      Mobility Bed Mobility Overal bed mobility: Needs Assistance Bed Mobility: Rolling, Sidelying to Sit Rolling: Min guard Sidelying to sit: HOB elevated, Min assist       General bed mobility comments: Increased time and reminders for log roll technique. Use of rail and Min A for trunk reaching out for therapist.    Transfers Overall transfer level: Needs assistance Equipment used: Rolling walker (2 wheels) Transfers: Sit to/from Stand Sit to Stand: Mod assist, +2 physical assistance           General transfer comment: Mod A of 1-2 for standing from EOB x2, from [facility] x1 (more assist needed from [facility]) with cues for hand placement/technique and to kick out RLE to avoid placing weight through it. Pt non compliant with NWB status throughout despite cues. Performed SPT bed to [facility] and BSC to chair placing full weight on RLE.      Balance Overall balance assessment: Needs assistance Sitting-balance support: Feet supported, Bilateral upper extremity supported Sitting balance-[name] Scale: Fair Sitting balance - Comments: Prefers UE support sitting EOB as position  of comfort.   Standing balance  support: During functional activity Standing balance-[name] Scale: Poor Standing balance comment: bil Ue support in standing but unable to keep weight off RLE. total A for pericare                           ADL either performed or assessed with clinical judgement   ADL Overall ADL's : Needs assistance/impaired Eating/Feeding: Set up;Sitting   Grooming: Set up;Sitting   Upper Body Bathing: Min guard;Sitting   Lower Body Bathing: Moderate assistance;Sitting/lateral leans;Sit to/from stand   Upper Body Dressing : Sitting;Min guard   Lower Body Dressing: Moderate assistance;Sit to/from stand Lower Body Dressing Details (indicate cue type and reason): able to achieve figure 4 but requiring assist for balance Toilet Transfer: Moderate assistance;+2 for physical assistance;+2 for safety/equipment;Maximal assistance;Stand-pivot;Cueing for sequencing;Cueing for safety;BSC/3in1 Toilet Transfer Details (indicate cue type and reason): patient needing to have BM, unable to maintain NWB status when pivoting to [facility] Toileting- Clothing Manipulation and Hygiene: Total assistance;Sitting/lateral lean;Sit to/from stand Toileting - Clothing Manipulation Details (indicate cue type and reason): unable to complete peri-care in standing     Functional mobility during ADLs: Maximal assistance;Cueing for sequencing;Cueing for safety;Rolling walker (2 wheels) General ADL Comments: Patient presenting with need for max cues to maintain NWB status, decreased cognition, and back pain     Vision Baseline Vision/History: 0 No visual deficits Ability to See in Adequate Light: 0 Adequate Patient Visual Report: No change from baseline Vision Assessment?: No apparent visual deficits     Perception Perception Perception Tested?: No   Praxis      Pertinent Vitals/Pain Pain Assessment Pain Assessment: 0-10 Pain Score: 8  Pain Location: back Pain Descriptors / Indicators: Aching, Guarding Pain  Intervention(s): Limited activity within patient's tolerance, Monitored during session, Repositioned, RN gave pain meds during session     Hand Dominance Right   Extremity/Trunk Assessment Upper Extremity Assessment Upper Extremity Assessment: Generalized weakness   Lower Extremity Assessment Lower Extremity Assessment: Defer to PT evaluation   Cervical / Trunk Assessment Cervical / Trunk Assessment: Back Surgery   Communication Communication Communication: No difficulties   Cognition Arousal/Alertness: Awake/alert Behavior During Therapy: WFL for tasks assessed/performed Overall Cognitive Status: Impaired/Different from baseline Area of Impairment: Memory, Following commands, Safety/judgement, Awareness, Problem solving                   Current Attention Level: Sustained (easily distracted) Memory: Decreased recall of precautions, Decreased short-term memory Following Commands: Follows one step commands with increased time, Follows multi-step commands inconsistently Safety/Judgement: Decreased awareness of safety, Decreased awareness of deficits Awareness: Emergent Problem Solving: Slow processing, Decreased initiation, Difficulty sequencing, Requires verbal cues General Comments: Patient aware he had surgery on his foot, and able to state NWB status, zero attempt to complete or correct despite max cues and tactile cues with movement     General Comments  HR up to 140s bpm with mobility. (max 142 at end of session, non sustaining)    Exercises     Shoulder Instructions      Home Living Family/patient expects to be discharged to:: Private residence Living Arrangements: Alone   Type of Home: House Home Access: Stairs to enter [employer] of Steps: 2 Entrance Stairs-Rails: Right;Left Home Layout: One level     Bathroom Shower/Tub: Aeronautical engineer: Wheelchair - manual  Prior Functioning/Environment Prior  Level of Function : Independent/Modified Independent             Mobility Comments: Pt reports no AD ADLs Comments: Pt reports indpendent in ADL, IADL, and driving        OT Problem List: Decreased strength;Decreased activity tolerance;Impaired balance (sitting and/or standing);Decreased cognition;Decreased safety awareness;Decreased knowledge of use of DME or AE;Decreased knowledge of precautions;Pain;Impaired UE functional use      OT Treatment/Interventions: Self-care/ADL training;Therapeutic exercise;DME and/or AE instruction;Therapeutic activities;Cognitive remediation/compensation;Patient/family education;Balance training    OT Goals(Current goals can be found in the care plan section) Acute Rehab OT Goals Patient Stated Goal: to get better OT Goal Formulation: With patient Time For Goal Achievement: 12/24/22 Potential to Achieve Goals: Fair ADL Goals Pt Will Transfer to Toilet: stand pivot transfer;bedside commode;with min assist Pt Will Perform Tub/Shower Transfer: Tub transfer;Stand pivot transfer;with min assist;tub bench Additional ADL Goal #1: Patient will maintain NWB on RLE and spinal precautions throughout ADLs.  OT Frequency: Min 2X/week    Co-evaluation              AM-PAC OT "6 Clicks" Daily Activity     Outcome Measure Help from another person eating meals?: A Little Help from another person taking care of personal grooming?: A Little Help from another person toileting, which includes using toliet, bedpan, or urinal?: A Lot Help from another person bathing (including washing, rinsing, drying)?: A Lot Help from another person to put on and taking off regular upper body clothing?: A Little Help from another person to put on and taking off regular lower body clothing?: A Lot 6 Click Score: 15   End of Session Equipment Utilized During Treatment: Gait belt;Rolling walker (2 wheels) Nurse Communication: Mobility status  Activity Tolerance: Patient  limited by fatigue;Patient limited by pain Patient left: in chair;with call bell/phone within reach;with chair alarm set  OT Visit Diagnosis: Unsteadiness on feet (R26.81);Muscle weakness (generalized) (M62.81);Pain;Other symptoms and signs involving cognitive function;Other abnormalities of gait and mobility (R26.89) Pain - part of body:  (Back)                Time: 9528-4132 OT Time Calculation (min): 42 min Charges:  OT General Charges $OT Visit: 1 Visit OT Evaluation $OT Eval Moderate Complexity: 1 Mod  Corinne Ports E. Aliciana Ricciardi, OTR/L Acute Rehabilitation Services [phone]   Ascencion Dike 12/10/2022, 2:28 PM

## 2022-12-11 DIAGNOSIS — L02611 Cutaneous abscess of right foot: Secondary | ICD-10-CM | POA: Diagnosis not present

## 2022-12-11 DIAGNOSIS — B9562 Methicillin resistant Staphylococcus aureus infection as the cause of diseases classified elsewhere: Secondary | ICD-10-CM | POA: Diagnosis not present

## 2022-12-11 DIAGNOSIS — M4647 Discitis, unspecified, lumbosacral region: Secondary | ICD-10-CM | POA: Diagnosis not present

## 2022-12-11 DIAGNOSIS — R7881 Bacteremia: Secondary | ICD-10-CM | POA: Diagnosis not present

## 2022-12-11 LAB — RENAL FUNCTION PANEL
Albumin: 1.8 g/dL — ABNORMAL LOW (ref 3.5–5.0)
Anion gap: 8 (ref 5–15)
BUN: 53 mg/dL — ABNORMAL HIGH (ref 6–20)
CO2: 24 mmol/L (ref 22–32)
Calcium: 8.2 mg/dL — ABNORMAL LOW (ref 8.9–10.3)
Chloride: 104 mmol/L (ref 98–111)
Creatinine, Ser: 1.83 mg/dL — ABNORMAL HIGH (ref 0.61–1.24)
GFR, Estimated: 46 mL/min — ABNORMAL LOW
Glucose, Bld: 93 mg/dL (ref 70–99)
Phosphorus: 4.3 mg/dL (ref 2.5–4.6)
Potassium: 4.2 mmol/L (ref 3.5–5.1)
Sodium: 136 mmol/L (ref 135–145)

## 2022-12-11 LAB — CBC
HCT: 24.5 % — ABNORMAL LOW (ref 39.0–52.0)
Hemoglobin: 8.2 g/dL — ABNORMAL LOW (ref 13.0–17.0)
MCH: 29.3 pg (ref 26.0–34.0)
MCHC: 33.5 g/dL (ref 30.0–36.0)
MCV: 87.5 fL (ref 80.0–100.0)
Platelets: 304 10*3/uL (ref 150–400)
RBC: 2.8 MIL/uL — ABNORMAL LOW (ref 4.22–5.81)
RDW: 13.8 % (ref 11.5–15.5)
WBC: 13.8 10*3/uL — ABNORMAL HIGH (ref 4.0–10.5)
nRBC: 0 % (ref 0.0–0.2)

## 2022-12-11 LAB — CULTURE, BLOOD (ROUTINE X 2)
Culture: NO GROWTH
Culture: NO GROWTH
Special Requests: ADEQUATE
Special Requests: ADEQUATE

## 2022-12-11 NOTE — Progress Notes (Signed)
PHARMACY CONSULT NOTE FOR:  INFECTIOUS DISEASE ANTIBIOTIC PLAN  Not a home OPAT candidate but could complete antibiotics at SNF if planned placement there. Let pharmacy know if orders for SNF need to be pended.   Indication: Disseminated MRSA infection Regimen: Daptomycin 850 mg IV every 24 hours End date: 02/03/23 (8 wks from 12/17)  IV antibiotic discharge orders are pended. To discharging provider:  please sign these orders via discharge navigator,  Select New Orders & click on the button choice - Manage This Unsigned Work.    Thank you for allowing pharmacy to be a part of this patient's care.  Alycia Rossetti, PharmD, BCPS Infectious Diseases Clinical Pharmacist 12/11/2022 7:41 AM   **Pharmacist phone directory can now be found on [email or web address] ([email or web address]).  Listed under [place].

## 2022-12-11 NOTE — Progress Notes (Signed)
PROGRESS NOTE    Hunter Reyes  JEH:[id number] DOB: 1976-07-22 DOA: 12/02/2022 PCP: Celene Squibb, MD     Chief Complaint  Patient presents with   Urinary Retention   Back Pain    Brief Narrative:  Hunter Reyes is a 46 y.o. Caucasian male with medical history significant for bipolar 1 disorder, borderline personality disorder, COPD, crack cocaine abuse, depression, peripheral neuropathy, history of right foot osteomyelitis, panic attack and history of seizures, who presented to the emergency room with acute onset of altered mental status with associated urinary retention and low back pain.  Creatinine was 21, he had noted to have urinary retention, stool incontinence, lower extremity edema, blood cultures were positive for MRSA, MRI significant for thoracolumbar discitis/abscess, right foot abscess as well, status post emergent laminectomy by neurosurgery, as well TMA by Dr. Sharol Given . Assessment & Plan:   Principal Problem:   MRSA bacteremia Active Problems:   AKI (acute kidney injury) ([place])   Diskitis   Bipolar 1 disorder ([place])   Peripheral neuropathy   Polysubstance abuse (HCC)   Metabolic acidosis   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 (HCC)   Cutaneous abscess of right foot    AKI (acute kidney injury) ([place]) Urinary retention - his is likely prerenal due to severe dehydration as well as obstructive uropathy and the use of nephrotoxic agents (NSAIDs). -At time of admission BUN about 300 and creatinine more than 20. -After Foley catheter placed and fluid resuscitation provided  -Function continues to improve, 1.8 today -Continue to monitor electrolyte closely for postobstructive diuresis - continue with flomax - voiding trial when more stable. - will check total CK as he is on daptomycin   Hyperkalemia  - Due to AKI, resolved with Lokelma  Hypernatremia Hyperchloremia Metabolic acidosis  -This has resolved with bicarb drip.  Diskitis/MRSA  bacteremia/epidural abscess -Discitis as evident on imaging -Patient appears with lower extremity weakness, urinary retention, possible incontinence, unclear acuity, as report last time he was able to stand up was a month ago -Management per ID, vancomycin has been switched to daptomycin -  sp Right L4 laminotomy, sublaminar decompression with debridement of ventral epidural abscess  by Dr. Kathyrn Sheriff  -Concern of both feet having osteomyelitis, could be the source, MRI is pending. -TEE with no vegetation, will not need TEE as it will not change management  -PICC line once cleared his bacteremia, will not be a candidate for OPAT at home, likely will need placement  -Still reporting stool incontinence, but lower extremity weakness much improved, still have Foley catheter, may be will need voiding trial at 1 point to see if retention has resolved.  Right foot abscess/to myelitis/tenosynovitis -Status post TMA by Dr. Sharol Given 12/17  Rash -appears to be improving   Peripheral neuropathy -Continue Neurontin and adjusted dose   Bipolar 1 disorder ([place]) -No suicidal ideation or hallucination -Stable mood currently -Continue Abilify-antidepressant therapy.   Polysubstance abuse ([place]) -This includes cocaine and likely opiates. -Continue the use of Suboxone   Bilateral heel ulcers POA  - cont with wound care, MRI of both feet tomorrow.    Hypomagnesemia Hypophosphatemia -Repleted  Hepatitis B infection -ID is following    DVT prophylaxis: Lovenox Code Status: Full Family Communication: None at bedside Disposition:   Status is: Inpatient    Consultants:  ID Neurosurgery Orthopedic Dr. Sharol Given  Subjective:  He had an episode of bowel movement yesterday, he reports stool incontinence. Objective: Vitals:   12/10/22 0400 12/10/22 0800 12/10/22 2030  12/11/22 0326  BP: (!) 111/57 (!) 110/54 (!) 105/50 (!) 106/50  Pulse: 77 76 75 70  Resp: 10 10 15 16   Temp: 98.5 F (36.9 C) 98.3  F (36.8 C) 98.4 F (36.9 C) 98.3 F (36.8 C)  TempSrc: Oral Oral Oral Oral  SpO2: 92% 94%    Weight:      Height:        Intake/Output Summary (Last 24 hours) at 12/11/2022 1407 Last data filed at 12/11/2022 0500 Gross per 24 hour  Intake --  Output 2700 ml  Net -2700 ml   Filed Weights   12/02/22 2230  Weight: (!) 149 kg    Examination  Awake Alert, Oriented X 3, No new F.N deficits, Normal affect Symmetrical Chest wall movement, Good air movement bilaterally, CTAB RRR,No Gallops,Rubs or new Murmurs, No Parasternal Heave +ve B.Sounds, Abd Soft, No tenderness, No rebound - guarding or rigidity. No Cyanosis, Clubbing or edema, right TMA ,maculopapular rashes improving   Data Reviewed: I have personally reviewed following labs and imaging studies  CBC: Recent Labs  Lab 12/07/22 0311 12/08/22 0224 12/09/22 0201 12/10/22 0453 12/11/22 0453  WBC 25.1* 22.5* 20.8* 15.2* 13.8*  HGB 10.4* 10.2* 9.5* 8.6* 8.2*  HCT 32.1* 30.7* 28.5* 25.7* 24.5*  MCV 88.7 87.7 87.7 87.4 87.5  PLT 289 283 274 280 335    Basic Metabolic Panel: Recent Labs  Lab 12/07/22 0314 12/08/22 0255 12/09/22 0211 12/10/22 0453 12/11/22 0453  NA 140 139 134* 138 136  K 3.8 3.4* 3.9 3.7 4.2  CL 111 107 102 106 104  CO2 19* 23 20* 23 24  GLUCOSE 96 92 96 104* 93  BUN 45* 40* 52* 54* 53*  CREATININE 1.30* 1.45* 1.85* 1.60* 1.83*  CALCIUM 7.9* 8.0* 7.8* 7.9* 8.2*  PHOS 3.6 4.0 5.4* 5.0* 4.3    GFR: Estimated Creatinine Clearance: 78.5 mL/min (A) (by C-G formula based on SCr of 1.83 mg/dL (H)).  Liver Function Tests: Recent Labs  Lab 12/07/22 0314 12/08/22 0255 12/09/22 0211 12/10/22 0453 12/11/22 0453  ALBUMIN 2.0* 2.0* 1.8* 1.7* 1.8*    CBG: Recent Labs  Lab 12/06/22 2103  GLUCAP 110*     Recent Results (from the past 240 hour(s))  Resp Panel by RT-PCR (Flu A&B, Covid) Anterior Nasal Swab     Status: None   Collection Time: 12/02/22 11:30 PM   Specimen: Anterior Nasal  Swab  Result Value Ref Range Status   SARS Coronavirus 2 by RT PCR NEGATIVE NEGATIVE Final    Comment: (NOTE) SARS-CoV-2 target nucleic acids are NOT DETECTED.  The SARS-CoV-2 RNA is generally detectable in upper respiratory specimens during the acute phase of infection. The lowest concentration of SARS-CoV-2 viral copies this assay can detect is 138 copies/mL. A negative result does not preclude SARS-Cov-2 infection and should not be used as the sole basis for treatment or other patient management decisions. A negative result may occur with  improper specimen collection/handling, submission of specimen other than nasopharyngeal swab, presence of viral mutation(s) within the areas targeted by this assay, and inadequate number of viral copies(<138 copies/mL). A negative result must be combined with clinical observations, patient history, and epidemiological information. The expected result is Negative.  Fact Sheet for Patients:  EntrepreneurPulse.com.au  Fact Sheet for Healthcare Providers:  IncredibleEmployment.be  This test is no t yet approved or cleared by the [place] FDA and  has been authorized for detection and/or diagnosis of SARS-CoV-2 by FDA under an Emergency Use Authorization (  EUA). This EUA will remain  in effect (meaning this test can be used) for the duration of the COVID-19 declaration under Section 564(b)(1) of the Act, 21 U.S.C.section 360bbb-3(b)(1), unless the authorization is terminated  or revoked sooner.       Influenza A by PCR NEGATIVE NEGATIVE Final   Influenza B by PCR NEGATIVE NEGATIVE Final    Comment: (NOTE) The Xpert Xpress SARS-CoV-2/FLU/RSV plus assay is intended as an aid in the diagnosis of influenza from Nasopharyngeal swab specimens and should not be used as a sole basis for treatment. Nasal washings and aspirates are unacceptable for Xpert Xpress SARS-CoV-2/FLU/RSV testing.  Fact Sheet for  Patients: EntrepreneurPulse.com.au  Fact Sheet for Healthcare Providers: IncredibleEmployment.be  This test is not yet approved or cleared by the [place] FDA and has been authorized for detection and/or diagnosis of SARS-CoV-2 by FDA under an Emergency Use Authorization (EUA). This EUA will remain in effect (meaning this test can be used) for the duration of the COVID-19 declaration under Section 564(b)(1) of the Act, 21 U.S.C. section 360bbb-3(b)(1), unless the authorization is terminated or revoked.  Performed at [facility], [address], [place], [place] [address]   Blood culture (routine x 2)     Status: Abnormal   Collection Time: 12/03/22  4:00 AM   Specimen: Site Not Specified; Blood  Result Value Ref Range Status   Specimen Description   Final    SITE NOT SPECIFIED BOTTLES DRAWN AEROBIC AND ANAEROBIC Performed at [facility], [address] [place], [place] [address]    Special Requests   Final    Blood Culture adequate volume Performed at [facility] [place] [place], [place] [address]    Culture  Setup Time   Final    GRAM POSITIVE COCCI AEB BOTTLES DRAWN AEROBIC ONLY Gram Stain Report Called to,Read Back By and Verified With: REECE THOMPSON,RN @2143  ON 12/04/22 BY SSLANE CRITICAL RESULT CALLED TO, READ BACK BY AND VERIFIED WITH: G ABBOTT,PHARMD@0200  12/05/22 [place] GRAM POSITIVE COCCI ANAEROBIC BOTTLE ONLY RESULT PREVIOUSLY CALLED Performed at [facility], [address], [place], [place] [address]    Culture METHICILLIN RESISTANT STAPHYLOCOCCUS AUREUS (A)  Final   Report Status 12/08/2022 FINAL  Final   Organism ID, Bacteria METHICILLIN RESISTANT STAPHYLOCOCCUS AUREUS  Final      Susceptibility   Methicillin resistant staphylococcus aureus - MIC*    CIPROFLOXACIN >=8 RESISTANT Resistant     ERYTHROMYCIN >=8 RESISTANT Resistant     GENTAMICIN <=0.5 SENSITIVE Sensitive     OXACILLIN >=4 RESISTANT Resistant      TETRACYCLINE <=1 SENSITIVE Sensitive     VANCOMYCIN <=0.5 SENSITIVE Sensitive     TRIMETH/SULFA 20 SENSITIVE Sensitive     CLINDAMYCIN <=0.25 SENSITIVE Sensitive     RIFAMPIN <=0.5 SENSITIVE Sensitive     Inducible Clindamycin NEGATIVE Sensitive     * METHICILLIN RESISTANT STAPHYLOCOCCUS AUREUS  Blood Culture ID Panel (Reflexed)     Status: Abnormal   Collection Time: 12/03/22  4:00 AM  Result Value Ref Range Status   Enterococcus faecalis NOT DETECTED NOT DETECTED Final   Enterococcus Faecium NOT DETECTED NOT DETECTED Final   Listeria monocytogenes NOT DETECTED NOT DETECTED Final   Staphylococcus species DETECTED (A) NOT DETECTED Final    Comment: CRITICAL RESULT CALLED TO, READ BACK BY AND VERIFIED WITH: G ABBOTT,PHARMD@0200  12/05/22 [place]    Staphylococcus aureus (BCID) DETECTED (A) NOT DETECTED Final    Comment: Methicillin (oxacillin)-resistant Staphylococcus aureus (MRSA). MRSA is predictably resistant  to beta-lactam antibiotics (except ceftaroline). Preferred therapy is vancomycin unless clinically contraindicated. Patient requires contact precautions if  hospitalized. CRITICAL RESULT CALLED TO, READ BACK BY AND VERIFIED WITH: G ABBOTT,PHARMD@0200  12/05/22 [place]    Staphylococcus epidermidis NOT DETECTED NOT DETECTED Final   Staphylococcus lugdunensis NOT DETECTED NOT DETECTED Final   Streptococcus species NOT DETECTED NOT DETECTED Final   Streptococcus agalactiae NOT DETECTED NOT DETECTED Final   Streptococcus pneumoniae NOT DETECTED NOT DETECTED Final   Streptococcus pyogenes NOT DETECTED NOT DETECTED Final   A.calcoaceticus-baumannii NOT DETECTED NOT DETECTED Final   Bacteroides fragilis NOT DETECTED NOT DETECTED Final   Enterobacterales NOT DETECTED NOT DETECTED Final   Enterobacter cloacae complex NOT DETECTED NOT DETECTED Final   Escherichia coli NOT DETECTED NOT DETECTED Final   Klebsiella aerogenes NOT DETECTED NOT DETECTED Final   Klebsiella oxytoca NOT DETECTED NOT  DETECTED Final   Klebsiella pneumoniae NOT DETECTED NOT DETECTED Final   Proteus species NOT DETECTED NOT DETECTED Final   Salmonella species NOT DETECTED NOT DETECTED Final   Serratia marcescens NOT DETECTED NOT DETECTED Final   Haemophilus influenzae NOT DETECTED NOT DETECTED Final   Neisseria meningitidis NOT DETECTED NOT DETECTED Final   Pseudomonas aeruginosa NOT DETECTED NOT DETECTED Final   Stenotrophomonas maltophilia NOT DETECTED NOT DETECTED Final   Candida albicans NOT DETECTED NOT DETECTED Final   Candida auris NOT DETECTED NOT DETECTED Final   Candida glabrata NOT DETECTED NOT DETECTED Final   Candida krusei NOT DETECTED NOT DETECTED Final   Candida parapsilosis NOT DETECTED NOT DETECTED Final   Candida tropicalis NOT DETECTED NOT DETECTED Final   Cryptococcus neoformans/gattii NOT DETECTED NOT DETECTED Final   Meth resistant mecA/C and MREJ DETECTED (A) NOT DETECTED Final    Comment: CRITICAL RESULT CALLED TO, READ BACK BY AND VERIFIED WITH: G ABBOTT,PHARMD@0200  12/05/22 [place] Performed at [facility], [address] [place], [place] [address]   Urine Culture     Status: Abnormal   Collection Time: 12/03/22  4:01 AM   Specimen: Urine, Catheterized  Result Value Ref Range Status   Specimen Description   Final    URINE, CATHETERIZED Performed at [facility], [address] [place], [place] [address]    Special Requests   Final    NONE Performed at [facility], [address] [place], [place] [address]    Culture (A)  Final    20,000 COLONIES/mL METHICILLIN RESISTANT STAPHYLOCOCCUS AUREUS   Report Status 12/05/2022 FINAL  Final   Organism ID, Bacteria METHICILLIN RESISTANT STAPHYLOCOCCUS AUREUS (A)  Final      Susceptibility   Methicillin resistant staphylococcus aureus - MIC*    CIPROFLOXACIN >=8 RESISTANT Resistant     GENTAMICIN <=0.5 SENSITIVE Sensitive     NITROFURANTOIN 32 SENSITIVE Sensitive     OXACILLIN >=4 RESISTANT Resistant     TETRACYCLINE <=1  SENSITIVE Sensitive     VANCOMYCIN <=0.5 SENSITIVE Sensitive     TRIMETH/SULFA <=10 SENSITIVE Sensitive     CLINDAMYCIN <=0.25 SENSITIVE Sensitive     RIFAMPIN <=0.5 SENSITIVE Sensitive     Inducible Clindamycin NEGATIVE Sensitive     * 20,000 COLONIES/mL METHICILLIN RESISTANT STAPHYLOCOCCUS AUREUS  Blood culture (routine x 2)     Status: Abnormal   Collection Time: 12/03/22  4:10 AM   Specimen: BLOOD  Result Value Ref Range Status   Specimen Description BLOOD SITE NOT SPECIFIED  Final   Special Requests   Final    BOTTLES DRAWN AEROBIC AND ANAEROBIC  Blood Culture adequate volume   Culture  Setup Time   Final    CRITICAL RESULT CALLED TO, READ BACK BY AND VERIFIED WITH: IN BOTH AEROBIC AND ANAEROBIC BOTTLES GRAM POSITIVE COCCI Gram Stain Report Called to,Read Back By and Verified With: NAKIA MCGOWAN @ 7672 ON 12/05/22 [place] CRITICAL RESULT CALLED TO, READ BACK BY AND VERIFIED WITH: RN Minna Merritts [facility] ON 12/05/22 @ 1950 BY DRT    Culture (A)  Final    STAPHYLOCOCCUS AUREUS SUSCEPTIBILITIES PERFORMED ON PREVIOUS CULTURE WITHIN THE LAST 5 DAYS. Performed at [facility], [place] [address], [place], [place] [address]    Report Status 12/08/2022 FINAL  Final  Blood Culture ID Panel (Reflexed)     Status: Abnormal   Collection Time: 12/03/22  4:10 AM  Result Value Ref Range Status   Enterococcus faecalis NOT DETECTED NOT DETECTED Final   Enterococcus Faecium NOT DETECTED NOT DETECTED Final   Listeria monocytogenes NOT DETECTED NOT DETECTED Final   Staphylococcus species DETECTED (A) NOT DETECTED Final    Comment: CRITICAL RESULT CALLED TO, READ BACK BY AND VERIFIED WITH: RN Minna Merritts [facility] ON 12/05/22 @ 1950 BY DRT    Staphylococcus aureus (BCID) DETECTED (A) NOT DETECTED Final    Comment: Methicillin (oxacillin)-resistant Staphylococcus aureus (MRSA). MRSA is predictably resistant to beta-lactam antibiotics (except ceftaroline). Preferred therapy is vancomycin unless clinically  contraindicated. Patient requires contact precautions if  hospitalized. CRITICAL RESULT CALLED TO, READ BACK BY AND VERIFIED WITH: RN Minna Merritts [facility] ON 12/05/22 @ 1950 BY DRT    Staphylococcus epidermidis NOT DETECTED NOT DETECTED Final   Staphylococcus lugdunensis NOT DETECTED NOT DETECTED Final   Streptococcus species NOT DETECTED NOT DETECTED Final   Streptococcus agalactiae NOT DETECTED NOT DETECTED Final   Streptococcus pneumoniae NOT DETECTED NOT DETECTED Final   Streptococcus pyogenes NOT DETECTED NOT DETECTED Final   A.calcoaceticus-baumannii NOT DETECTED NOT DETECTED Final   Bacteroides fragilis NOT DETECTED NOT DETECTED Final   Enterobacterales NOT DETECTED NOT DETECTED Final   Enterobacter cloacae complex NOT DETECTED NOT DETECTED Final   Escherichia coli NOT DETECTED NOT DETECTED Final   Klebsiella aerogenes NOT DETECTED NOT DETECTED Final   Klebsiella oxytoca NOT DETECTED NOT DETECTED Final   Klebsiella pneumoniae NOT DETECTED NOT DETECTED Final   Proteus species NOT DETECTED NOT DETECTED Final   Salmonella species NOT DETECTED NOT DETECTED Final   Serratia marcescens NOT DETECTED NOT DETECTED Final   Haemophilus influenzae NOT DETECTED NOT DETECTED Final   Neisseria meningitidis NOT DETECTED NOT DETECTED Final   Pseudomonas aeruginosa NOT DETECTED NOT DETECTED Final   Stenotrophomonas maltophilia NOT DETECTED NOT DETECTED Final   Candida albicans NOT DETECTED NOT DETECTED Final   Candida auris NOT DETECTED NOT DETECTED Final   Candida glabrata NOT DETECTED NOT DETECTED Final   Candida krusei NOT DETECTED NOT DETECTED Final   Candida parapsilosis NOT DETECTED NOT DETECTED Final   Candida tropicalis NOT DETECTED NOT DETECTED Final   Cryptococcus neoformans/gattii NOT DETECTED NOT DETECTED Final   Meth resistant mecA/C and MREJ DETECTED (A) NOT DETECTED Final    Comment: CRITICAL RESULT CALLED TO, READ BACK BY AND VERIFIED WITH: RN Minna Merritts [facility] ON 12/05/22 @ 1950 BY  DRT Performed at [facility], [address] [place], [place] [address]   Culture, blood (Routine X 2) w Reflex to ID Panel     Status: Abnormal   Collection Time: 12/05/22  9:12 AM   Specimen: BLOOD  Result Value Ref Range  Status   Specimen Description BLOOD LEFT ANTECUBITAL  Final   Special Requests   Final    BOTTLES DRAWN AEROBIC AND ANAEROBIC Blood Culture adequate volume   Culture  Setup Time   Final    GRAM POSITIVE COCCI IN CLUSTERS IN BOTH AEROBIC AND ANAEROBIC BOTTLES CRITICAL RESULT CALLED TO, READ BACK BY AND VERIFIED WITHFerne Coe PHARMD, AT 1334 12/06/22 D. [place]    Culture (A)  Final    STAPHYLOCOCCUS SPECIES (COAGULASE NEGATIVE) STAPHYLOCOCCUS AUREUS SUSCEPTIBILITIES PERFORMED ON PREVIOUS CULTURE WITHIN THE LAST 5 DAYS. Performed at [facility], [place] [address], [place], [place] [address]    Report Status 12/08/2022 FINAL  Final   Organism ID, Bacteria STAPHYLOCOCCUS SPECIES (COAGULASE NEGATIVE)  Final      Susceptibility   Staphylococcus species (coagulase negative) - MIC*    CIPROFLOXACIN >=8 RESISTANT Resistant     ERYTHROMYCIN RESISTANT Resistant     GENTAMICIN <=0.5 SENSITIVE Sensitive     OXACILLIN >=4 RESISTANT Resistant     TETRACYCLINE <=1 SENSITIVE Sensitive     VANCOMYCIN <=0.5 SENSITIVE Sensitive     TRIMETH/SULFA <=10 SENSITIVE Sensitive     CLINDAMYCIN RESISTANT Resistant     RIFAMPIN <=0.5 SENSITIVE Sensitive     Inducible Clindamycin POSITIVE Resistant     * STAPHYLOCOCCUS SPECIES (COAGULASE NEGATIVE)  Culture, blood (Routine X 2) w Reflex to ID Panel     Status: Abnormal   Collection Time: 12/05/22  9:15 AM   Specimen: BLOOD LEFT WRIST  Result Value Ref Range Status   Specimen Description BLOOD LEFT WRIST  Final   Special Requests   Final    BOTTLES DRAWN AEROBIC AND ANAEROBIC Blood Culture adequate volume   Culture  Setup Time   Final    GRAM POSITIVE COCCI IN CLUSTERS AEROBIC BOTTLE ONLY CRITICAL VALUE NOTED.  VALUE IS  CONSISTENT WITH PREVIOUSLY REPORTED AND CALLED VALUE.    Culture (A)  Final    STAPHYLOCOCCUS SPECIES (COAGULASE NEGATIVE) SUSCEPTIBILITIES PERFORMED ON PREVIOUS CULTURE WITHIN THE LAST 5 DAYS. Performed at [facility], [place] [address] [place], [place] [address]    Report Status 12/08/2022 FINAL  Final  Aerobic/Anaerobic Culture w Gram Stain (surgical/deep wound)     Status: None   Collection Time: 12/05/22  7:52 PM   Specimen: Abscess  Result Value Ref Range Status   Specimen Description ABSCESS  Final   Special Requests LUMBAR 4 EPIDURAL SPACE  Final   Gram Stain   Final    RARE WBC PRESENT, PREDOMINANTLY PMN NO ORGANISMS SEEN    Culture   Final    RARE METHICILLIN RESISTANT STAPHYLOCOCCUS AUREUS NO ANAEROBES ISOLATED Performed at [facility], [place] [address] [place], [place] [address]    Report Status 12/10/2022 FINAL  Final   Organism ID, Bacteria METHICILLIN RESISTANT STAPHYLOCOCCUS AUREUS  Final      Susceptibility   Methicillin resistant staphylococcus aureus - MIC*    CIPROFLOXACIN >=8 RESISTANT Resistant     ERYTHROMYCIN >=8 RESISTANT Resistant     GENTAMICIN <=0.5 SENSITIVE Sensitive     OXACILLIN >=4 RESISTANT Resistant     TETRACYCLINE <=1 SENSITIVE Sensitive     VANCOMYCIN <=0.5 SENSITIVE Sensitive     TRIMETH/SULFA <=10 SENSITIVE Sensitive     CLINDAMYCIN <=0.25 SENSITIVE Sensitive     RIFAMPIN <=0.5 SENSITIVE Sensitive     Inducible Clindamycin NEGATIVE Sensitive     * RARE METHICILLIN RESISTANT STAPHYLOCOCCUS AUREUS  Culture, blood (Routine X  2) w Reflex to ID Panel     Status: None   Collection Time: 12/06/22  3:09 PM   Specimen: BLOOD LEFT HAND  Result Value Ref Range Status   Specimen Description BLOOD LEFT HAND  Final   Special Requests   Final    BOTTLES DRAWN AEROBIC ONLY Blood Culture adequate volume   Culture   Final    NO GROWTH 5 DAYS Performed at [facility], [address], [place], [place] [address]    Report Status  12/11/2022 FINAL  Final  Culture, blood (Routine X 2) w Reflex to ID Panel     Status: None   Collection Time: 12/06/22  3:10 PM   Specimen: BLOOD RIGHT HAND  Result Value Ref Range Status   Specimen Description BLOOD RIGHT HAND  Final   Special Requests   Final    BOTTLES DRAWN AEROBIC AND ANAEROBIC Blood Culture adequate volume   Culture   Final    NO GROWTH 5 DAYS Performed at [facility], [place] [address] [place], [place] [address]    Report Status 12/11/2022 FINAL  Final         Radiology Studies: No results found.      Scheduled Meds:  vitamin C  1,000 mg Oral Daily   buprenorphine-naloxone  1 tablet Sublingual BID   Chlorhexidine Gluconate Cloth  6 each Topical Q0600   docusate sodium  100 mg Oral Daily   enoxaparin (LOVENOX) injection  75 mg Subcutaneous Q24H   gabapentin  600 mg Oral BID   LORazepam  1 mg Intravenous Once   nutrition supplement (JUVEN)  1 packet Oral BID BM   pantoprazole  40 mg Oral Daily   polyethylene glycol  17 g Oral Daily   sertraline  25 mg Oral Daily   thiamine  500 mg Oral Daily   zinc sulfate  220 mg Oral Daily   Continuous Infusions:  sodium chloride 50 mL/hr at 12/11/22 0851   DAPTOmycin (CUBICIN) 850 mg in sodium chloride 0.9 % IVPB 850 mg (12/10/22 2100)   lactated ringers     magnesium sulfate bolus IVPB       LOS: 8 days        Phillips Climes, MD [facility]   To contact the attending provider between 7A-7P or the covering provider during after hours 7P-7A, please log into the web site [email or web address] and access using universal [place] password for that web site. If you do not have the password, please call the hospital operator.  12/11/2022, 2:07 PM

## 2022-12-11 NOTE — Progress Notes (Signed)
Pharmacy Antibiotic Note  Hunter Reyes is a 46 y.o. male admitted on 12/02/2022 with MRSA bacteremia. Pharmacy has been consulted for Daptomycin dosing. ID following. Repeat blood culture on 12/14 showed no growth/final.   AKI on admit, was improving with SCr down to 1.3 on 12/15, however  SCr has increased , now 1.83.  CrCl is 78 ml/min CK 201 on 12/14, qThurs.  IVFs continue.  Plan: - Continue Daptomycin 850 mg (8 mg/kg AdjBW) every 24 hours - Monitor CK  weekly on Thurs next due 12/20.  - Will continue to follow renal function, culture results, LOT, and antibiotic de-escalation plans   Height: 6\' 2"  (188 cm) Weight: (!) 149 kg (328 lb 7.8 oz) IBW/kg (Calculated) : 82.2  Temp (24hrs), Avg:98.4 F (36.9 C), Min:98.3 F (36.8 C), Max:98.4 F (36.9 C)  Recent Labs  Lab 12/05/22 0524 12/06/22 0451 12/07/22 0311 12/07/22 0314 12/08/22 0224 12/08/22 0255 12/09/22 0201 12/09/22 0211 12/10/22 0453 12/11/22 0453  WBC  --    < > 25.1*  --  22.5*  --  20.8*  --  15.2* 13.8*  CREATININE 1.88*   < >  --  1.30*  --  1.45*  --  1.85* 1.60* 1.83*  VANCORANDOM 7  --   --   --   --   --   --   --   --   --    < > = values in this interval not displayed.     Estimated Creatinine Clearance: 78.5 mL/min (A) (by C-G formula based on SCr of 1.83 mg/dL (H)).    Allergies  Allergen Reactions   Darvocet [Propoxyphene N-Acetaminophen] Hives and Itching   Tramadol Hives and Itching   Other Hives    Antimicrobials this admission: Vancomycin 2g 12/11 x 1 dose Cefepime 12/11 >> 12/12 Flagyl 12/11 >> 12/12 12/13 Daptomycin >>   Weekly CK levels:  12/14 CK - 201  Dose adjustments this admission: VR 7 mcg/ml on 12/13 AM  Microbiology results: 12/3 UCx with MRSA and staph epi 12/11 BCX: MRSA 12/11 UCX: MRSA 12/13 BCx: staph aureus 12/13 Abscess: staph aureus 12/14: BCx:  negative   Thank you for allowing [place] to participate in this patients care. Nicole Cella, RPh Clinical  Pharmacist  12/11/2022 12:57 PM  **Pharmacist phone directory can be found on [email or web address] listed under [place]**

## 2022-12-11 NOTE — Progress Notes (Signed)
Physical Therapy Treatment Patient Details Name: Hunter Reyes MRN: [id number] DOB: [birth year] Today's Date: 12/11/2022   History of Present Illness 46 y.o. male admitted 12/10 with AMS, urinary retention and low back pain. Underwent L4 lumbar laminectomy for epidural abscess 12/13. s/p I &D right foot 12/17. PMH:  bipolar 1 disorder, borderline personality disorder, COPD, crack cocaine abuse, depression, peripheral neuropathy, history of right foot osteomyelitis, panic attack and history of seizures    PT Comments    Pt was seen for mobiltiy on side of bed with help to get to midline balance of trunk, then to scoot on side of bed with PT physically supporting R foot off the floor.  Pt is more demonstrating inattention to the restrictions of ROM than actual inability to perform NWB on RLE.  Follow acutely for goals of PT with progression to standing and hopping as able.  CIR recommended to increase his strength and WB awareness on RLE, esp with wound vac and skin openings creating an easily damaged foot.   Recommendations for follow up therapy are one component of a multi-disciplinary discharge planning process, led by the attending physician.  Recommendations may be updated based on patient status, additional functional criteria and insurance authorization.  Follow Up Recommendations  Acute inpatient rehab (3hours/day)     Assistance Recommended at Discharge Frequent or constant Supervision/Assistance  Patient can return home with the following Assistance with cooking/housework;Assist for transportation;Help with stairs or ramp for entrance;A lot of help with walking and/or transfers;A little help with bathing/dressing/bathroom   Equipment Recommendations  Rolling walker (2 wheels);BSC/3in1    Recommendations for Other Services       Precautions / Restrictions Precautions Precautions: Fall;Back Precaution Booklet Issued: No Precaution Comments: watch HR, incontinent stool,  reviewed back precautions Required Braces or Orthoses: Other Brace Other Brace: post op shoe Restrictions Weight Bearing Restrictions: Yes RLE Weight Bearing: Non weight bearing     Mobility  Bed Mobility Overal bed mobility: Needs Assistance Bed Mobility: Supine to Sit, Sit to Supine Rolling: Min assist       Sit to sidelying: Min assist General bed mobility comments: dense repetitive cues for back precautions and not using R foot on the bed to reposition    Transfers Overall transfer level: Needs assistance Equipment used: 1 person hand held assist Transfers: Bed to chair/wheelchair/BSC            Lateral/Scoot Transfers: Min assist General transfer comment: min assist to cue and assist to scoot up side of bed but then pt just gives up and wants to return to bed flat    Ambulation/Gait               General Gait Details: unable to safely attempt   Stairs             Wheelchair Mobility    Modified Rankin (Stroke Patients Only)       Balance Overall balance assessment: Needs assistance Sitting-balance support: Feet supported Sitting balance-[name] Scale: Fair Sitting balance - Comments: leans for ward on side of bed, no midline awareness demonstrated Postural control: Right lateral lean                                  Cognition Arousal/Alertness: Awake/alert Behavior During Therapy: WFL for tasks assessed/performed, Impulsive Overall Cognitive Status: Impaired/Different from baseline Area of Impairment: Problem solving, Awareness, Safety/judgement, Following commands, Memory, Attention, Orientation  Orientation Level: Situation Current Attention Level: Selective Memory: Decreased recall of precautions, Decreased short-term memory Following Commands: Follows one step commands with increased time Safety/Judgement: Decreased awareness of safety, Decreased awareness of deficits Awareness: Intellectual,  Emergent Problem Solving: Slow processing, Requires verbal cues, Requires tactile cues General Comments: Pt basically reports he cannot focus on the NWB element to RLE and that PT will have to help him manage it        Exercises      General Comments General comments (skin integrity, edema, etc.): pt is assisted up to side of bed, assisted to maintain NWB on R foot while scooting and then back to bed with PT assist      Pertinent Vitals/Pain Pain Assessment Pain Assessment: Faces Faces Pain Scale: Hurts even more Pain Location: back Pain Descriptors / Indicators: Aching, Guarding Pain Intervention(s): Limited activity within patient's tolerance, Premedicated before session, Monitored during session, Repositioned    Home Living                          Prior Function            PT Goals (current goals can now be found in the care plan section) Acute Rehab PT Goals Patient Stated Goal: to go home    Frequency    Min 5X/week      PT Plan Current plan remains appropriate    Co-evaluation              AM-PAC PT "6 Clicks" Mobility   Outcome Measure  Help needed turning from your back to your side while in a flat bed without using bedrails?: A Little Help needed moving from lying on your back to sitting on the side of a flat bed without using bedrails?: A Little Help needed moving to and from a bed to a chair (including a wheelchair)?: A Lot Help needed standing up from a chair using your arms (e.g., wheelchair or bedside chair)?: A Lot Help needed to walk in hospital room?: Total Help needed climbing 3-5 steps with a railing? : Total 6 Click Score: 12    End of Session Equipment Utilized During Treatment: Gait belt Activity Tolerance: Patient tolerated treatment well;Patient limited by pain Patient left: in bed;with call bell/phone within reach;with bed alarm set Nurse Communication: Mobility status PT Visit Diagnosis: Muscle weakness  (generalized) (M62.81);Pain;Difficulty in walking, not elsewhere classified (R26.2) Pain - Right/Left: Right Pain - part of body: Ankle and joints of foot     Time: 9407-6808 PT Time Calculation (min) (ACUTE ONLY): 19 min  Charges:  $Therapeutic Activity: 8-22 mins    Ramond Dial 12/11/2022, 5:16 PM  Mee Hives, PT PhD Acute Rehab Dept. Number: [place] [facility] [place]

## 2022-12-11 NOTE — Progress Notes (Signed)
[name]  Date of Admission:  12/02/2022           Reason for visit: Follow up on MRSA bacteremia  Current antibiotics: Daptomycin  ASSESSMENT:    46 y.o. male admitted with:  #MRSA bacteremia: Blood cultures positive on 12/03/2022 with documented clearance 12/06/2022.  TTE was negative for vegetation.  #Discitis/osteomyelitis with epidural abscess: Status post decompression and debridement on 12/13 with neurosurgery.  Operative cultures additionally grew MRSA.  #Right foot osteomyelitis and abscess: Status post TMA with Dr. Sharol Given with operative margins that were clear on 12/09/2022.  #History of substance use.  RECOMMENDATIONS:    Continue daptomycin per pharmacy Would not feel comfortable with patient going home on IV antibiotics via PICC line given suspected polysubstance use and placing patient at SNF will likely be challenging as well Recommend continuing IV antibiotics inpatient for now In the setting of disseminated MRSA infection with bacteremia and epidural abscess with discitis/osteomyelitis, recommend 8 weeks of antibiotic therapy through 02/03/23  Would like to get a repeat MRI lumbar spine with contrast approximately 4 weeks from his surgery on 01/02/23 to reassess his spinal infection.  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 ([facility])   Cutaneous abscess of right foot    MEDICATIONS:    Scheduled Meds:  vitamin C  1,000 mg Oral Daily   buprenorphine-naloxone  1 tablet  Sublingual BID   Chlorhexidine Gluconate Cloth  6 each Topical Q0600   docusate sodium  100 mg Oral Daily   enoxaparin (LOVENOX) injection  75 mg Subcutaneous Q24H   gabapentin  600 mg Oral BID   LORazepam  1 mg Intravenous Once   nutrition supplement (JUVEN)  1 packet Oral BID BM   pantoprazole  40 mg Oral Daily   polyethylene glycol  17 g Oral Daily   sertraline  25 mg Oral Daily   thiamine  500 mg Oral Daily   zinc sulfate  220 mg Oral Daily   Continuous Infusions:  sodium chloride 50 mL/hr at 12/11/22 0851   DAPTOmycin (CUBICIN) 850 mg in sodium chloride 0.9 % IVPB 850 mg (12/10/22 2100)   lactated ringers     magnesium sulfate bolus IVPB     PRN Meds:.acetaminophen **OR** acetaminophen, acetaminophen, albuterol, alum & mag hydroxide-simeth, bisacodyl, guaiFENesin-dextromethorphan, hydrALAZINE, HYDROmorphone (DILAUDID) injection, labetalol, magnesium citrate, magnesium sulfate bolus IVPB, metoprolol tartrate, ondansetron **OR** ondansetron (ZOFRAN) IV, ondansetron, oxyCODONE, oxyCODONE, phenol, polyethylene glycol, potassium chloride, tiZANidine, traZODone  SUBJECTIVE:   24 hour events:  No acute events  No new complaints  Review of Systems  All other systems reviewed and are negative.     OBJECTIVE:   Blood pressure (!) 106/50, pulse 70, temperature 98.3 F (36.8 C), temperature source Oral, resp. rate 16, height 6\' 2"  (1.88 m), weight (!) 149 kg, SpO2 94 %. Body mass index is 42.18 kg/m.  Physical Exam Constitutional:      Appearance: Normal appearance.  HENT:     Head: Normocephalic and atraumatic.  Abdominal:     General: There is no distension.     Palpations: Abdomen is soft.  Neurological:     General: No focal deficit present.     Mental Status: He is alert and oriented to person, place, and time.  Psychiatric:        Mood and Affect: Mood normal.        Behavior: Behavior normal.      Lab Results: Lab Results  Component Value Date   WBC 13.8  (H) 12/11/2022   HGB 8.2 (L) 12/11/2022   HCT 24.5 (L) 12/11/2022   MCV 87.5 12/11/2022   PLT 304 12/11/2022    Lab Results  Component Value Date   NA 136 12/11/2022   K 4.2 12/11/2022   CO2 24 12/11/2022   GLUCOSE 93 12/11/2022   BUN 53 (H) 12/11/2022   CREATININE 1.83 (H) 12/11/2022   CALCIUM 8.2 (L) 12/11/2022   GFRNONAA 46 (L) 12/11/2022   GFRAA >60 09/28/2017    Lab Results  Component Value Date   ALT 15 12/03/2022   AST 17 12/03/2022   ALKPHOS 99 12/03/2022   BILITOT 1.0 12/03/2022       Component Value Date/Time   CRP 9.7 (H) 12/05/2022 1131       Component Value Date/Time   ESRSEDRATE 83 (H) 12/05/2022 1131     I have reviewed the micro and lab results in Epic.  Imaging: No results found.   Imaging independently reviewed in Epic.    Raynelle Highland for [facility] [phone] pager 12/11/2022, 2:28 PM

## 2022-12-11 NOTE — Plan of Care (Signed)
  Problem: Fluid Volume: Goal: Hemodynamic stability will improve Outcome: Progressing   Problem: Clinical Measurements: Goal: Diagnostic test results will improve Outcome: Progressing Goal: Signs and symptoms of infection will decrease Outcome: Progressing   Problem: Respiratory: Goal: Ability to maintain adequate ventilation will improve Outcome: Progressing   Problem: Education: Goal: Knowledge of General Education information will improve Description: Including pain rating scale, medication(s)/side effects and non-pharmacologic comfort measures Outcome: Progressing   Problem: Health Behavior/Discharge Planning: Goal: Ability to manage health-related needs will improve Outcome: Progressing   Problem: Clinical Measurements: Goal: Ability to maintain clinical measurements within normal limits will improve Outcome: Progressing Goal: Will remain free from infection Outcome: Progressing Goal: Diagnostic test results will improve Outcome: Progressing Goal: Respiratory complications will improve Outcome: Progressing Goal: Cardiovascular complication will be avoided Outcome: Progressing   Problem: Activity: Goal: Risk for activity intolerance will decrease Outcome: Progressing   Problem: Nutrition: Goal: Adequate nutrition will be maintained Outcome: Progressing   Problem: Coping: Goal: Level of anxiety will decrease Outcome: Progressing   Problem: Elimination: Goal: Will not experience complications related to bowel motility Outcome: Progressing Goal: Will not experience complications related to urinary retention Outcome: Progressing   Problem: Pain Managment: Goal: General experience of comfort will improve Outcome: Progressing   Problem: Safety: Goal: Ability to remain free from injury will improve Outcome: Progressing   Problem: Skin Integrity: Goal: Risk for impaired skin integrity will decrease Outcome: Progressing   Problem: Education: Goal:  Knowledge of the prescribed therapeutic regimen will improve Outcome: Progressing Goal: Ability to verbalize activity precautions or restrictions will improve Outcome: Progressing Goal: Understanding of discharge needs will improve Outcome: Progressing   Problem: Activity: Goal: Ability to perform//tolerate increased activity and mobilize with assistive devices will improve Outcome: Progressing   Problem: Clinical Measurements: Goal: Postoperative complications will be avoided or minimized Outcome: Progressing   Problem: Self-Care: Goal: Ability to meet self-care needs will improve Outcome: Progressing   Problem: Self-Concept: Goal: Ability to maintain and perform role responsibilities to the fullest extent possible will improve Outcome: Progressing   Problem: Pain Management: Goal: Pain level will decrease with appropriate interventions Outcome: Progressing   Problem: Skin Integrity: Goal: Demonstration of wound healing without infection will improve Outcome: Progressing

## 2022-12-12 DIAGNOSIS — F1910 Other psychoactive substance abuse, uncomplicated: Secondary | ICD-10-CM | POA: Diagnosis not present

## 2022-12-12 DIAGNOSIS — R7881 Bacteremia: Secondary | ICD-10-CM | POA: Diagnosis not present

## 2022-12-12 DIAGNOSIS — N179 Acute kidney failure, unspecified: Secondary | ICD-10-CM | POA: Diagnosis not present

## 2022-12-12 DIAGNOSIS — B181 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: Secondary | ICD-10-CM | POA: Diagnosis not present

## 2022-12-12 LAB — RENAL FUNCTION PANEL
Albumin: 1.7 g/dL — ABNORMAL LOW (ref 3.5–5.0)
Anion gap: 10 (ref 5–15)
BUN: 45 mg/dL — ABNORMAL HIGH (ref 6–20)
CO2: 23 mmol/L (ref 22–32)
Calcium: 7.9 mg/dL — ABNORMAL LOW (ref 8.9–10.3)
Chloride: 107 mmol/L (ref 98–111)
Creatinine, Ser: 1.74 mg/dL — ABNORMAL HIGH (ref 0.61–1.24)
GFR, Estimated: 49 mL/min — ABNORMAL LOW (ref >60)
Glucose, Bld: 96 mg/dL (ref 70–99)
Phosphorus: 5.1 mg/dL — ABNORMAL HIGH (ref 2.5–4.6)
Potassium: 4.1 mmol/L (ref 3.5–5.1)
Sodium: 140 mmol/L (ref 135–145)

## 2022-12-12 LAB — CK: Total CK: 90 U/L (ref 49–397)

## 2022-12-12 NOTE — Progress Notes (Addendum)
PROGRESS NOTE        PATIENT DETAILS Name: Hunter Reyes Age: 46 y.o. Sex: male Date of Birth: Oct 23, 1976 Admit Date: 12/02/2022 Admitting Physician [name], MD [id number], Edwinna Areola, MD  Brief Summary: Patient is a 47 y.o.  male with history of bipolar disorder, crack cocaine use, peripheral neuropathy-who was transferred from [facility] for AKI/urinary retention-upon further evaluation-he was found to have MRSA bacteremia with right foot abscess requiring TMA, lumbar discitis/abscess-requiring emergent laminectomy.  See below for further details.  Significant events: 12/10>> admit to [facility] at AP-AKI-urinary retention.  Plans to transfer to [facility]. 12/13>> laminectomy/decompression of ventral lumbar epidural abscess 12/17>> transmetatarsal amputation of right foot  Significant studies: 12/11>> CT renal stone study: Possible discitis L2-L3/L3-L4, moderate bilateral hydronephrosis/hydroureter 12/13>> MRI L-spine: Discitis/osteomyelitis at L3-L4 with 9 mm thick ventral canal phlegmon/abscess which extends from L3 to L4-L5.  Multiple paraspinal abscesses within the psoas musculature. 12/13>> echo: EF 60-65%, no evidence of valvular vegetations. 12/14>> MRI left foot: No osteomyelitis/abscess 12/15>> MRI right foot: Abscess with osteomyelitis of second/third metatarsals.  Significant microbiology data: 12/10>> COVID/influenza PCR: Negative 12/11>> blood culture: MRSA 12/13>> blood culture: MRSA 12/13>> epidural abscess culture: MRSA 12/14>> blood culture: No growth  Procedures: 12/13>> right L4 laminectomy-sublaminar decompression of ventral epidural abscess. 12/17>> transmetatarsal amputation of right foot  Consults: ID Neurosurgery  Subjective: Lying comfortably in bed-denies any chest pain or shortness of breath.  Objective: Vitals: Blood pressure 124/68, pulse 78, temperature 97.8 F (36.6 C), temperature source Oral, resp. rate 17, height 6\' 2"  (1.88  m), weight (!) 149 kg, SpO2 98 %.   Exam: Gen Exam:Alert awake-not in any distress HEENT:atraumatic, normocephalic Chest: B/L clear to auscultation anteriorly CVS:S1S2 regular Abdomen:soft non tender, non distended Extremities:no edema Neurology: Non focal Skin: no rash  Pertinent Labs/Radiology:    Latest Ref Rng & Units 12/11/2022    4:53 AM 12/10/2022    4:53 AM 12/09/2022    2:01 AM  CBC  WBC 4.0 - 10.5 K/uL 13.8  15.2  20.8   Hemoglobin 13.0 - 17.0 g/dL 8.2  8.6  9.5   Hematocrit 39.0 - 52.0 % 24.5  25.7  28.5   Platelets 150 - 400 K/uL 304  280  274     Lab Results  Component Value Date   NA 140 12/12/2022   K 4.1 12/12/2022   CL 107 12/12/2022   CO2 23 12/12/2022      Assessment/Plan: AKI due to obstructive uropathy Urinary retention Significant improvement in renal function  Voiding trial tomorrow morning  MRSA bacteremia L3-L4 discitis with epidural abscess-s/p laminectomy/decompression on 12/13 Right foot osteomyelitis/abscess-s/p transmetatarsal amputation on 12/17 Overall improved-repeat cultures negative so far Orthopedics/neurosurgery following-able to lift both lower extremities off the bed. Continue to work with PT/OT ID recommending 8 weeks of IV Daptomycin-end date 02/03/2023, repeat MRI L spine on 01/02/23- and to reconsult ID around 1/10 to revisit  antibiotic plan following repeat imaging Given history of drug use-unfortunately will remain inpatient to complete IV antibiotic duration.  Not a candidate for home therapies.  Peripheral neuropathy Continue Neurontin  Bipolar 1 disorder Stable Continue Abilify  Polysubstance abuse History of cocaine use Remains on Suboxone  Chronic hepatitis B infection defer further to infectious disease  Morbid Obesity: Estimated body mass index is 42.18 kg/m as calculated from the following:   Height as  of this encounter: 6\' 2"  (1.88 m).   Weight as of this encounter: 149 kg.   Code status:   Code  Status: Full Code   DVT Prophylaxis: SCD's Start: 12/09/22 1137 Prophylactic Lovenox   Family Communication: None at bedside   Disposition Plan: Status is: Inpatient Remains inpatient appropriate because: Severity of illness   Planned Discharge Destination:Home ultimately-but will need to remain inpatient to complete IV daptomycin-end date 02/04/20   Diet: Diet Order             Diet Carb Modified Fluid consistency: Thin; Room service appropriate? Yes  Diet effective now                     Antimicrobial agents: Anti-infectives (From admission, onward)    Start     Dose/Rate Route Frequency Ordered Stop   12/05/22 1800  DAPTOmycin (CUBICIN) 850 mg in sodium chloride 0.9 % IVPB        8 mg/kg  108.9 kg (Adjusted) 134 mL/hr over 30 Minutes Intravenous Daily 12/05/22 1654 02/03/23 2359   12/05/22 1200  DAPTOmycin (CUBICIN) 850 mg in sodium chloride 0.9 % IVPB  Status:  Discontinued        8 mg/kg  108.9 kg (Adjusted) 134 mL/hr over 30 Minutes Intravenous Daily 12/05/22 0923 12/05/22 1654   12/05/22 0800  vancomycin (VANCOREADY) IVPB 2000 mg/400 mL  Status:  Discontinued        2,000 mg 200 mL/hr over 120 Minutes Intravenous Every 24 hours 12/05/22 0632 12/05/22 0923   12/04/22 2200  ceFEPIme (MAXIPIME) 2 g in sodium chloride 0.9 % 100 mL IVPB  Status:  Discontinued        2 g 200 mL/hr over 30 Minutes Intravenous Every 12 hours 12/04/22 1242 12/05/22 0237   12/04/22 0600  ceFEPIme (MAXIPIME) 1 g in sodium chloride 0.9 % 100 mL IVPB  Status:  Discontinued        1 g 200 mL/hr over 30 Minutes Intravenous Every 24 hours 12/03/22 0508 12/04/22 1242   12/03/22 1100  ceFEPIme (MAXIPIME) 2 g in sodium chloride 0.9 % 100 mL IVPB        2 g 200 mL/hr over 30 Minutes Intravenous  Once 12/03/22 0418 12/03/22 1120   12/03/22 0508  vancomycin variable dose per unstable renal function (pharmacist dosing)  Status:  Discontinued         Does not apply See admin instructions  12/03/22 0508 12/05/22 0849   12/03/22 0500  metroNIDAZOLE (FLAGYL) IVPB 500 mg  Status:  Discontinued        500 mg 100 mL/hr over 60 Minutes Intravenous Every 12 hours 12/03/22 0418 12/05/22 0237   12/03/22 0430  vancomycin (VANCOCIN) IVPB 1000 mg/200 mL premix  Status:  Discontinued        1,000 mg 200 mL/hr over 60 Minutes Intravenous  Once 12/03/22 0418 12/03/22 0421   12/03/22 0415  vancomycin (VANCOREADY) IVPB 2000 mg/400 mL        2,000 mg 200 mL/hr over 120 Minutes Intravenous  Once 12/03/22 0404 12/03/22 0630   12/03/22 0200  cefTRIAXone (ROCEPHIN) 2 g in sodium chloride 0.9 % 100 mL IVPB        2 g 200 mL/hr over 30 Minutes Intravenous  Once 12/03/22 0153 12/03/22 0238        MEDICATIONS: Scheduled Meds:  vitamin C  1,000 mg Oral Daily   buprenorphine-naloxone  1 tablet Sublingual BID   Chlorhexidine Gluconate Cloth  6 each Topical Q0600   docusate sodium  100 mg Oral Daily   enoxaparin (LOVENOX) injection  75 mg Subcutaneous Q24H   gabapentin  600 mg Oral BID   LORazepam  1 mg Intravenous Once   nutrition supplement (JUVEN)  1 packet Oral BID BM   pantoprazole  40 mg Oral Daily   polyethylene glycol  17 g Oral Daily   sertraline  25 mg Oral Daily   thiamine  500 mg Oral Daily   zinc sulfate  220 mg Oral Daily   Continuous Infusions:  sodium chloride 50 mL/hr at 12/11/22 0851   DAPTOmycin (CUBICIN) 850 mg in sodium chloride 0.9 % IVPB 850 mg (12/11/22 2100)   lactated ringers     magnesium sulfate bolus IVPB     PRN Meds:.acetaminophen **OR** acetaminophen, acetaminophen, albuterol, alum & mag hydroxide-simeth, bisacodyl, guaiFENesin-dextromethorphan, hydrALAZINE, HYDROmorphone (DILAUDID) injection, labetalol, magnesium citrate, magnesium sulfate bolus IVPB, metoprolol tartrate, ondansetron **OR** ondansetron (ZOFRAN) IV, ondansetron, oxyCODONE, oxyCODONE, phenol, polyethylene glycol, potassium chloride, tiZANidine, traZODone   I have personally reviewed  following labs and imaging studies  LABORATORY DATA: CBC: Recent Labs  Lab 12/07/22 0311 12/08/22 0224 12/09/22 0201 12/10/22 0453 12/11/22 0453  WBC 25.1* 22.5* 20.8* 15.2* 13.8*  HGB 10.4* 10.2* 9.5* 8.6* 8.2*  HCT 32.1* 30.7* 28.5* 25.7* 24.5*  MCV 88.7 87.7 87.7 87.4 87.5  PLT 289 283 274 280 786    Basic Metabolic Panel: Recent Labs  Lab 12/08/22 0255 12/09/22 0211 12/10/22 0453 12/11/22 0453 12/12/22 0321  NA 139 134* 138 136 140  K 3.4* 3.9 3.7 4.2 4.1  CL 107 102 106 104 107  CO2 23 20* 23 24 23   GLUCOSE 92 96 104* 93 96  BUN 40* 52* 54* 53* 45*  CREATININE 1.45* 1.85* 1.60* 1.83* 1.74*  CALCIUM 8.0* 7.8* 7.9* 8.2* 7.9*  PHOS 4.0 5.4* 5.0* 4.3 5.1*    GFR: Estimated Creatinine Clearance: 82.6 mL/min (A) (by C-G formula based on SCr of 1.74 mg/dL (H)).  Liver Function Tests: Recent Labs  Lab 12/08/22 0255 12/09/22 0211 12/10/22 0453 12/11/22 0453 12/12/22 0321  ALBUMIN 2.0* 1.8* 1.7* 1.8* 1.7*   No results for input(s): "LIPASE", "AMYLASE" in the last 168 hours. No results for input(s): "AMMONIA" in the last 168 hours.  Coagulation Profile: No results for input(s): "INR", "PROTIME" in the last 168 hours.  Cardiac Enzymes: Recent Labs  Lab 12/06/22 0451 12/12/22 0321  CKTOTAL 201 90    BNP (last 3 results) No results for input(s): "PROBNP" in the last 8760 hours.  Lipid Profile: No results for input(s): "CHOL", "HDL", "LDLCALC", "TRIG", "CHOLHDL", "LDLDIRECT" in the last 72 hours.  Thyroid Function Tests: No results for input(s): "TSH", "T4TOTAL", "FREET4", "T3FREE", "THYROIDAB" in the last 72 hours.  Anemia Panel: No results for input(s): "VITAMINB12", "FOLATE", "FERRITIN", "TIBC", "IRON", "RETICCTPCT" in the last 72 hours.  Urine analysis:    Component Value Date/Time   COLORURINE AMBER (A) 12/02/2022 2325   APPEARANCEUR CLOUDY (A) 12/02/2022 2325   LABSPEC 1.012 12/02/2022 2325   PHURINE 7.0 12/02/2022 2325   GLUCOSEU  NEGATIVE 12/02/2022 2325   HGBUR MODERATE (A) 12/02/2022 2325   BILIRUBINUR NEGATIVE 12/02/2022 2325   KETONESUR NEGATIVE 12/02/2022 2325   PROTEINUR 100 (A) 12/02/2022 2325   UROBILINOGEN 0.2 04/30/2013 1416   NITRITE NEGATIVE 12/02/2022 2325   LEUKOCYTESUR LARGE (A) 12/02/2022 2325    Sepsis Labs: Lactic Acid, Venous    Component Value Date/Time   LATICACIDVEN 1.2 12/03/2022 0007  MICROBIOLOGY: Recent Results (from the past 240 hour(s))  Resp Panel by RT-PCR (Flu A&B, Covid) Anterior Nasal Swab     Status: None   Collection Time: 12/02/22 11:30 PM   Specimen: Anterior Nasal Swab  Result Value Ref Range Status   SARS Coronavirus 2 by RT PCR NEGATIVE NEGATIVE Final    Comment: (NOTE) SARS-CoV-2 target nucleic acids are NOT DETECTED.  The SARS-CoV-2 RNA is generally detectable in upper respiratory specimens during the acute phase of infection. The lowest concentration of SARS-CoV-2 viral copies this assay can detect is 138 copies/mL. A negative result does not preclude SARS-Cov-2 infection and should not be used as the sole basis for treatment or other patient management decisions. A negative result may occur with  improper specimen collection/handling, submission of specimen other than nasopharyngeal swab, presence of viral mutation(s) within the areas targeted by this assay, and inadequate number of viral copies(<138 copies/mL). A negative result must be combined with clinical observations, patient history, and epidemiological information. The expected result is Negative.  Fact Sheet for Patients:  EntrepreneurPulse.com.au  Fact Sheet for Healthcare Providers:  IncredibleEmployment.be  This test is no t yet approved or cleared by the [place] FDA and  has been authorized for detection and/or diagnosis of SARS-CoV-2 by FDA under an Emergency Use Authorization (EUA). This EUA will remain  in effect (meaning this test can be  used) for the duration of the COVID-19 declaration under Section 564(b)(1) of the Act, 21 U.S.C.section 360bbb-3(b)(1), unless the authorization is terminated  or revoked sooner.       Influenza A by PCR NEGATIVE NEGATIVE Final   Influenza B by PCR NEGATIVE NEGATIVE Final    Comment: (NOTE) The Xpert Xpress SARS-CoV-2/FLU/RSV plus assay is intended as an aid in the diagnosis of influenza from Nasopharyngeal swab specimens and should not be used as a sole basis for treatment. Nasal washings and aspirates are unacceptable for Xpert Xpress SARS-CoV-2/FLU/RSV testing.  Fact Sheet for Patients: EntrepreneurPulse.com.au  Fact Sheet for Healthcare Providers: IncredibleEmployment.be  This test is not yet approved or cleared by the [place] FDA and has been authorized for detection and/or diagnosis of SARS-CoV-2 by FDA under an Emergency Use Authorization (EUA). This EUA will remain in effect (meaning this test can be used) for the duration of the COVID-19 declaration under Section 564(b)(1) of the Act, 21 U.S.C. section 360bbb-3(b)(1), unless the authorization is terminated or revoked.  Performed at [facility], [address] [place], [place] [address]   Blood culture (routine x 2)     Status: Abnormal   Collection Time: 12/03/22  4:00 AM   Specimen: Site Not Specified; Blood  Result Value Ref Range Status   Specimen Description   Final    SITE NOT SPECIFIED BOTTLES DRAWN AEROBIC AND ANAEROBIC Performed at [facility], [address] [place], [place] [address]    Special Requests   Final    Blood Culture adequate volume Performed at [facility], [address] [place], [place] [address]    Culture  Setup Time   Final    GRAM POSITIVE COCCI AEB BOTTLES DRAWN AEROBIC ONLY Gram Stain Report Called to,Read Back By and Verified With: REECE THOMPSON,RN @2143  ON 12/04/22 BY SSLANE CRITICAL RESULT CALLED TO, READ BACK BY AND VERIFIED WITH: G  ABBOTT,PHARMD@0200  12/05/22 [place] GRAM POSITIVE COCCI ANAEROBIC BOTTLE ONLY RESULT PREVIOUSLY CALLED Performed at [facility], [address] [place], [place] [address]    Culture METHICILLIN RESISTANT STAPHYLOCOCCUS AUREUS (A)  Final  Report Status 12/08/2022 FINAL  Final   Organism ID, Bacteria METHICILLIN RESISTANT STAPHYLOCOCCUS AUREUS  Final      Susceptibility   Methicillin resistant staphylococcus aureus - MIC*    CIPROFLOXACIN >=8 RESISTANT Resistant     ERYTHROMYCIN >=8 RESISTANT Resistant     GENTAMICIN <=0.5 SENSITIVE Sensitive     OXACILLIN >=4 RESISTANT Resistant     TETRACYCLINE <=1 SENSITIVE Sensitive     VANCOMYCIN <=0.5 SENSITIVE Sensitive     TRIMETH/SULFA 20 SENSITIVE Sensitive     CLINDAMYCIN <=0.25 SENSITIVE Sensitive     RIFAMPIN <=0.5 SENSITIVE Sensitive     Inducible Clindamycin NEGATIVE Sensitive     * METHICILLIN RESISTANT STAPHYLOCOCCUS AUREUS  Blood Culture ID Panel (Reflexed)     Status: Abnormal   Collection Time: 12/03/22  4:00 AM  Result Value Ref Range Status   Enterococcus faecalis NOT DETECTED NOT DETECTED Final   Enterococcus Faecium NOT DETECTED NOT DETECTED Final   Listeria monocytogenes NOT DETECTED NOT DETECTED Final   Staphylococcus species DETECTED (A) NOT DETECTED Final    Comment: CRITICAL RESULT CALLED TO, READ BACK BY AND VERIFIED WITH: G ABBOTT,PHARMD@0200  12/05/22 [place]    Staphylococcus aureus (BCID) DETECTED (A) NOT DETECTED Final    Comment: Methicillin (oxacillin)-resistant Staphylococcus aureus (MRSA). MRSA is predictably resistant to beta-lactam antibiotics (except ceftaroline). Preferred therapy is vancomycin unless clinically contraindicated. Patient requires contact precautions if  hospitalized. CRITICAL RESULT CALLED TO, READ BACK BY AND VERIFIED WITH: G ABBOTT,PHARMD@0200  12/05/22 [place]    Staphylococcus epidermidis NOT DETECTED NOT DETECTED Final   Staphylococcus lugdunensis NOT DETECTED NOT DETECTED Final   Streptococcus  species NOT DETECTED NOT DETECTED Final   Streptococcus agalactiae NOT DETECTED NOT DETECTED Final   Streptococcus pneumoniae NOT DETECTED NOT DETECTED Final   Streptococcus pyogenes NOT DETECTED NOT DETECTED Final   A.calcoaceticus-baumannii NOT DETECTED NOT DETECTED Final   Bacteroides fragilis NOT DETECTED NOT DETECTED Final   Enterobacterales NOT DETECTED NOT DETECTED Final   Enterobacter cloacae complex NOT DETECTED NOT DETECTED Final   Escherichia coli NOT DETECTED NOT DETECTED Final   Klebsiella aerogenes NOT DETECTED NOT DETECTED Final   Klebsiella oxytoca NOT DETECTED NOT DETECTED Final   Klebsiella pneumoniae NOT DETECTED NOT DETECTED Final   Proteus species NOT DETECTED NOT DETECTED Final   Salmonella species NOT DETECTED NOT DETECTED Final   Serratia marcescens NOT DETECTED NOT DETECTED Final   Haemophilus influenzae NOT DETECTED NOT DETECTED Final   Neisseria meningitidis NOT DETECTED NOT DETECTED Final   Pseudomonas aeruginosa NOT DETECTED NOT DETECTED Final   Stenotrophomonas maltophilia NOT DETECTED NOT DETECTED Final   Candida albicans NOT DETECTED NOT DETECTED Final   Candida auris NOT DETECTED NOT DETECTED Final   Candida glabrata NOT DETECTED NOT DETECTED Final   Candida krusei NOT DETECTED NOT DETECTED Final   Candida parapsilosis NOT DETECTED NOT DETECTED Final   Candida tropicalis NOT DETECTED NOT DETECTED Final   Cryptococcus neoformans/gattii NOT DETECTED NOT DETECTED Final   Meth resistant mecA/C and MREJ DETECTED (A) NOT DETECTED Final    Comment: CRITICAL RESULT CALLED TO, READ BACK BY AND VERIFIED WITH: G ABBOTT,PHARMD@0200  12/05/22 [place] Performed at [facility], [address] [place], [place] [address]   Urine Culture     Status: Abnormal   Collection Time: 12/03/22  4:01 AM   Specimen: Urine, Catheterized  Result Value Ref Range Status   Specimen Description   Final    URINE, CATHETERIZED Performed at [facility], [place]  [address]  [place], [place] [address]    Special Requests   Final    NONE Performed at [facility], [address], [place], [place] [address]    Culture (A)  Final    20,000 COLONIES/mL METHICILLIN RESISTANT STAPHYLOCOCCUS AUREUS   Report Status 12/05/2022 FINAL  Final   Organism ID, Bacteria METHICILLIN RESISTANT STAPHYLOCOCCUS AUREUS (A)  Final      Susceptibility   Methicillin resistant staphylococcus aureus - MIC*    CIPROFLOXACIN >=8 RESISTANT Resistant     GENTAMICIN <=0.5 SENSITIVE Sensitive     NITROFURANTOIN 32 SENSITIVE Sensitive     OXACILLIN >=4 RESISTANT Resistant     TETRACYCLINE <=1 SENSITIVE Sensitive     VANCOMYCIN <=0.5 SENSITIVE Sensitive     TRIMETH/SULFA <=10 SENSITIVE Sensitive     CLINDAMYCIN <=0.25 SENSITIVE Sensitive     RIFAMPIN <=0.5 SENSITIVE Sensitive     Inducible Clindamycin NEGATIVE Sensitive     * 20,000 COLONIES/mL METHICILLIN RESISTANT STAPHYLOCOCCUS AUREUS  Blood culture (routine x 2)     Status: Abnormal   Collection Time: 12/03/22  4:10 AM   Specimen: BLOOD  Result Value Ref Range Status   Specimen Description BLOOD SITE NOT SPECIFIED  Final   Special Requests   Final    BOTTLES DRAWN AEROBIC AND ANAEROBIC Blood Culture adequate volume   Culture  Setup Time   Final    CRITICAL RESULT CALLED TO, READ BACK BY AND VERIFIED WITH: IN BOTH AEROBIC AND ANAEROBIC BOTTLES GRAM POSITIVE COCCI Gram Stain Report Called to,Read Back By and Verified With: NAKIA MCGOWAN @ 9163 ON 12/05/22 C [place] CRITICAL RESULT CALLED TO, READ BACK BY AND VERIFIED WITH: RN Minna Merritts [facility] ON 12/05/22 @ 1950 BY DRT    Culture (A)  Final    STAPHYLOCOCCUS AUREUS SUSCEPTIBILITIES PERFORMED ON PREVIOUS CULTURE WITHIN THE LAST 5 DAYS. Performed at [facility], [place] [address], [place], [place] [address]    Report Status 12/08/2022 FINAL  Final  Blood Culture ID Panel (Reflexed)     Status: Abnormal   Collection Time: 12/03/22  4:10 AM  Result Value Ref Range Status    Enterococcus faecalis NOT DETECTED NOT DETECTED Final   Enterococcus Faecium NOT DETECTED NOT DETECTED Final   Listeria monocytogenes NOT DETECTED NOT DETECTED Final   Staphylococcus species DETECTED (A) NOT DETECTED Final    Comment: CRITICAL RESULT CALLED TO, READ BACK BY AND VERIFIED WITH: RN Minna Merritts [facility] ON 12/05/22 @ 1950 BY DRT    Staphylococcus aureus (BCID) DETECTED (A) NOT DETECTED Final    Comment: Methicillin (oxacillin)-resistant Staphylococcus aureus (MRSA). MRSA is predictably resistant to beta-lactam antibiotics (except ceftaroline). Preferred therapy is vancomycin unless clinically contraindicated. Patient requires contact precautions if  hospitalized. CRITICAL RESULT CALLED TO, READ BACK BY AND VERIFIED WITH: RN Minna Merritts [facility] ON 12/05/22 @ 1950 BY DRT    Staphylococcus epidermidis NOT DETECTED NOT DETECTED Final   Staphylococcus lugdunensis NOT DETECTED NOT DETECTED Final   Streptococcus species NOT DETECTED NOT DETECTED Final   Streptococcus agalactiae NOT DETECTED NOT DETECTED Final   Streptococcus pneumoniae NOT DETECTED NOT DETECTED Final   Streptococcus pyogenes NOT DETECTED NOT DETECTED Final   A.calcoaceticus-baumannii NOT DETECTED NOT DETECTED Final   Bacteroides fragilis NOT DETECTED NOT DETECTED Final   Enterobacterales NOT DETECTED NOT DETECTED Final   Enterobacter cloacae complex NOT DETECTED NOT DETECTED Final   Escherichia coli NOT DETECTED NOT DETECTED Final   Klebsiella aerogenes NOT DETECTED NOT DETECTED Final   Klebsiella oxytoca NOT  DETECTED NOT DETECTED Final   Klebsiella pneumoniae NOT DETECTED NOT DETECTED Final   Proteus species NOT DETECTED NOT DETECTED Final   Salmonella species NOT DETECTED NOT DETECTED Final   Serratia marcescens NOT DETECTED NOT DETECTED Final   Haemophilus influenzae NOT DETECTED NOT DETECTED Final   Neisseria meningitidis NOT DETECTED NOT DETECTED Final   Pseudomonas aeruginosa NOT DETECTED NOT DETECTED Final    Stenotrophomonas maltophilia NOT DETECTED NOT DETECTED Final   Candida albicans NOT DETECTED NOT DETECTED Final   Candida auris NOT DETECTED NOT DETECTED Final   Candida glabrata NOT DETECTED NOT DETECTED Final   Candida krusei NOT DETECTED NOT DETECTED Final   Candida parapsilosis NOT DETECTED NOT DETECTED Final   Candida tropicalis NOT DETECTED NOT DETECTED Final   Cryptococcus neoformans/gattii NOT DETECTED NOT DETECTED Final   Meth resistant mecA/C and MREJ DETECTED (A) NOT DETECTED Final    Comment: CRITICAL RESULT CALLED TO, READ BACK BY AND VERIFIED WITH: RN Demetrio Lapping ON 12/05/22 @ 1950 BY DRT Performed at [facility], [place] [address], [place], [place] [address]   Culture, blood (Routine X 2) w Reflex to ID Panel     Status: Abnormal   Collection Time: 12/05/22  9:12 AM   Specimen: BLOOD  Result Value Ref Range Status   Specimen Description BLOOD LEFT ANTECUBITAL  Final   Special Requests   Final    BOTTLES DRAWN AEROBIC AND ANAEROBIC Blood Culture adequate volume   Culture  Setup Time   Final    GRAM POSITIVE COCCI IN CLUSTERS IN BOTH AEROBIC AND ANAEROBIC BOTTLES CRITICAL RESULT CALLED TO, READ BACK BY AND VERIFIED WITH: [place], AT 1334 12/06/22 D. VANHOOK    Culture (A)  Final    STAPHYLOCOCCUS SPECIES (COAGULASE NEGATIVE) STAPHYLOCOCCUS AUREUS SUSCEPTIBILITIES PERFORMED ON PREVIOUS CULTURE WITHIN THE LAST 5 DAYS. Performed at [facility], [place] [address], [place], [place] [address]    Report Status 12/08/2022 FINAL  Final   Organism ID, Bacteria STAPHYLOCOCCUS SPECIES (COAGULASE NEGATIVE)  Final      Susceptibility   Staphylococcus species (coagulase negative) - MIC*    CIPROFLOXACIN >=8 RESISTANT Resistant     ERYTHROMYCIN RESISTANT Resistant     GENTAMICIN <=0.5 SENSITIVE Sensitive     OXACILLIN >=4 RESISTANT Resistant     TETRACYCLINE <=1 SENSITIVE Sensitive     VANCOMYCIN <=0.5 SENSITIVE Sensitive     TRIMETH/SULFA <=10 SENSITIVE  Sensitive     CLINDAMYCIN RESISTANT Resistant     RIFAMPIN <=0.5 SENSITIVE Sensitive     Inducible Clindamycin POSITIVE Resistant     * STAPHYLOCOCCUS SPECIES (COAGULASE NEGATIVE)  Culture, blood (Routine X 2) w Reflex to ID Panel     Status: Abnormal   Collection Time: 12/05/22  9:15 AM   Specimen: BLOOD LEFT WRIST  Result Value Ref Range Status   Specimen Description BLOOD LEFT WRIST  Final   Special Requests   Final    BOTTLES DRAWN AEROBIC AND ANAEROBIC Blood Culture adequate volume   Culture  Setup Time   Final    GRAM POSITIVE COCCI IN CLUSTERS AEROBIC BOTTLE ONLY CRITICAL VALUE NOTED.  VALUE IS CONSISTENT WITH PREVIOUSLY REPORTED AND CALLED VALUE.    Culture (A)  Final    STAPHYLOCOCCUS SPECIES (COAGULASE NEGATIVE) SUSCEPTIBILITIES PERFORMED ON PREVIOUS CULTURE WITHIN THE LAST 5 DAYS. Performed at [facility], [place] [address], [place], [place] [address]    Report Status 12/08/2022 FINAL  Final  Aerobic/Anaerobic Culture  w Gram Stain (surgical/deep wound)     Status: None   Collection Time: 12/05/22  7:52 PM   Specimen: Abscess  Result Value Ref Range Status   Specimen Description ABSCESS  Final   Special Requests LUMBAR 4 EPIDURAL SPACE  Final   Gram Stain   Final    RARE WBC PRESENT, PREDOMINANTLY PMN NO ORGANISMS SEEN    Culture   Final    RARE METHICILLIN RESISTANT STAPHYLOCOCCUS AUREUS NO ANAEROBES ISOLATED Performed at [facility], [place] [address], [place], [place] [address]    Report Status 12/10/2022 FINAL  Final   Organism ID, Bacteria METHICILLIN RESISTANT STAPHYLOCOCCUS AUREUS  Final      Susceptibility   Methicillin resistant staphylococcus aureus - MIC*    CIPROFLOXACIN >=8 RESISTANT Resistant     ERYTHROMYCIN >=8 RESISTANT Resistant     GENTAMICIN <=0.5 SENSITIVE Sensitive     OXACILLIN >=4 RESISTANT Resistant     TETRACYCLINE <=1 SENSITIVE Sensitive     VANCOMYCIN <=0.5 SENSITIVE Sensitive     TRIMETH/SULFA <=10 SENSITIVE Sensitive      CLINDAMYCIN <=0.25 SENSITIVE Sensitive     RIFAMPIN <=0.5 SENSITIVE Sensitive     Inducible Clindamycin NEGATIVE Sensitive     * RARE METHICILLIN RESISTANT STAPHYLOCOCCUS AUREUS  Culture, blood (Routine X 2) w Reflex to ID Panel     Status: None   Collection Time: 12/06/22  3:09 PM   Specimen: BLOOD LEFT HAND  Result Value Ref Range Status   Specimen Description BLOOD LEFT HAND  Final   Special Requests   Final    BOTTLES DRAWN AEROBIC ONLY Blood Culture adequate volume   Culture   Final    NO GROWTH 5 DAYS Performed at [facility], [address], [place], [place] [address]    Report Status 12/11/2022 FINAL  Final  Culture, blood (Routine X 2) w Reflex to ID Panel     Status: None   Collection Time: 12/06/22  3:10 PM   Specimen: BLOOD RIGHT HAND  Result Value Ref Range Status   Specimen Description BLOOD RIGHT HAND  Final   Special Requests   Final    BOTTLES DRAWN AEROBIC AND ANAEROBIC Blood Culture adequate volume   Culture   Final    NO GROWTH 5 DAYS Performed at [facility], [place] [address] [place], [place] [address]    Report Status 12/11/2022 FINAL  Final    RADIOLOGY STUDIES/RESULTS: No results found.   LOS: 9 days   Oren Binet, MD  [facility]    To contact the attending provider between 7A-7P or the covering provider during after hours 7P-7A, please log into the web site [email or web address] and access using universal [place] password for that web site. If you do not have the password, please call the hospital operator.  12/12/2022, 3:42 PM

## 2022-12-13 DIAGNOSIS — R7881 Bacteremia: Secondary | ICD-10-CM | POA: Diagnosis not present

## 2022-12-13 DIAGNOSIS — G6289 Other specified polyneuropathies: Secondary | ICD-10-CM | POA: Diagnosis not present

## 2022-12-13 DIAGNOSIS — M4647 Discitis, unspecified, lumbosacral region: Secondary | ICD-10-CM | POA: Diagnosis not present

## 2022-12-13 DIAGNOSIS — N179 Acute kidney failure, unspecified: Secondary | ICD-10-CM | POA: Diagnosis not present

## 2022-12-13 LAB — RENAL FUNCTION PANEL
Albumin: 1.8 g/dL — ABNORMAL LOW (ref 3.5–5.0)
Anion gap: 8 (ref 5–15)
BUN: 39 mg/dL — ABNORMAL HIGH (ref 6–20)
CO2: 23 mmol/L (ref 22–32)
Calcium: 7.9 mg/dL — ABNORMAL LOW (ref 8.9–10.3)
Chloride: 106 mmol/L (ref 98–111)
Creatinine, Ser: 1.91 mg/dL — ABNORMAL HIGH (ref 0.61–1.24)
GFR, Estimated: 44 mL/min — ABNORMAL LOW (ref >60)
Glucose, Bld: 94 mg/dL (ref 70–99)
Phosphorus: 5.6 mg/dL — ABNORMAL HIGH (ref 2.5–4.6)
Potassium: 4.2 mmol/L (ref 3.5–5.1)
Sodium: 137 mmol/L (ref 135–145)

## 2022-12-13 LAB — CK: Total CK: 84 U/L (ref 49–397)

## 2022-12-13 NOTE — Plan of Care (Signed)
  Problem: Fluid Volume: Goal: Hemodynamic stability will improve Outcome: Progressing   Problem: Clinical Measurements: Goal: Diagnostic test results will improve Outcome: Progressing Goal: Signs and symptoms of infection will decrease Outcome: Progressing   Problem: Respiratory: Goal: Ability to maintain adequate ventilation will improve Outcome: Progressing   Problem: Education: Goal: Knowledge of General Education information will improve Description: Including pain rating scale, medication(s)/side effects and non-pharmacologic comfort measures Outcome: Progressing   Problem: Health Behavior/Discharge Planning: Goal: Ability to manage health-related needs will improve Outcome: Progressing   Problem: Clinical Measurements: Goal: Ability to maintain clinical measurements within normal limits will improve Outcome: Progressing Goal: Will remain free from infection Outcome: Progressing Goal: Diagnostic test results will improve Outcome: Progressing Goal: Respiratory complications will improve Outcome: Progressing Goal: Cardiovascular complication will be avoided Outcome: Progressing   Problem: Activity: Goal: Risk for activity intolerance will decrease Outcome: Progressing   Problem: Nutrition: Goal: Adequate nutrition will be maintained Outcome: Progressing   Problem: Coping: Goal: Level of anxiety will decrease Outcome: Progressing   Problem: Elimination: Goal: Will not experience complications related to bowel motility Outcome: Progressing Goal: Will not experience complications related to urinary retention Outcome: Progressing   Problem: Pain Managment: Goal: General experience of comfort will improve Outcome: Progressing   Problem: Safety: Goal: Ability to remain free from injury will improve Outcome: Progressing   Problem: Skin Integrity: Goal: Risk for impaired skin integrity will decrease Outcome: Progressing   Problem: Education: Goal:  Knowledge of the prescribed therapeutic regimen will improve Outcome: Progressing Goal: Ability to verbalize activity precautions or restrictions will improve Outcome: Progressing Goal: Understanding of discharge needs will improve Outcome: Progressing   Problem: Activity: Goal: Ability to perform//tolerate increased activity and mobilize with assistive devices will improve Outcome: Progressing   Problem: Clinical Measurements: Goal: Postoperative complications will be avoided or minimized Outcome: Progressing   Problem: Self-Care: Goal: Ability to meet self-care needs will improve Outcome: Progressing   Problem: Self-Concept: Goal: Ability to maintain and perform role responsibilities to the fullest extent possible will improve Outcome: Progressing   Problem: Pain Management: Goal: Pain level will decrease with appropriate interventions Outcome: Progressing   Problem: Skin Integrity: Goal: Demonstration of wound healing without infection will improve Outcome: Progressing

## 2022-12-13 NOTE — Progress Notes (Signed)
Physical Therapy Treatment Patient Details Name: Hunter Reyes MRN: [id number] DOB: October 09, 1976 Today's Date: 12/13/2022   History of Present Illness 46 y.o. male admitted 12/10 with AMS, urinary retention and low back pain. Underwent L4 lumbar laminectomy for epidural abscess 12/13. s/p R foot transmet amp 12/17. PMH:  bipolar 1 disorder, borderline personality disorder, COPD, crack cocaine abuse, depression, peripheral neuropathy, history of right foot osteomyelitis, panic attack and history of seizures    PT Comments    Pt continues to demonstrate difficulty with maintaining NWB RLE. Session focused on lateral scoot transfers to maximize pt's independence. He required min assist bed mobility and min assist lateral scoot bed to recliner. LE exercises performed. Pt in recliner with feet elevated at end of session.    Recommendations for follow up therapy are one component of a multi-disciplinary discharge planning process, led by the attending physician.  Recommendations may be updated based on patient status, additional functional criteria and insurance authorization.  Follow Up Recommendations  Skilled nursing-short term rehab (<3 hours/day) Can patient physically be transported by private vehicle: No   Assistance Recommended at Discharge Frequent or constant Supervision/Assistance  Patient can return home with the following Assistance with cooking/housework;Assist for transportation;Help with stairs or ramp for entrance;A lot of help with walking and/or transfers;A little help with bathing/dressing/bathroom   Equipment Recommendations  Rolling walker (2 wheels);BSC/3in1;Wheelchair (measurements PT)    Recommendations for Other Services       Precautions / Restrictions Precautions Precautions: Fall;Back Required Braces or Orthoses: Other Brace Other Brace: post op shoe Restrictions Weight Bearing Restrictions: Yes RLE Weight Bearing: Non weight bearing     Mobility  Bed  Mobility Overal bed mobility: Needs Assistance Bed Mobility: Sidelying to Sit   Sidelying to sit: Min assist, HOB elevated       General bed mobility comments: +rail    Transfers Overall transfer level: Needs assistance Equipment used: None Transfers: Bed to chair/wheelchair/BSC            Lateral/Scoot Transfers: Min assist General transfer comment: lateral scoot bed to recliner toward R. Continual cues for sequencing    Ambulation/Gait                   Stairs             Wheelchair Mobility    Modified Rankin (Stroke Patients Only)       Balance Overall balance assessment: Needs assistance Sitting-balance support: Feet supported, Single extremity supported, No upper extremity supported Sitting balance-[name] Scale: Fair                                      Cognition Arousal/Alertness: Awake/alert Behavior During Therapy: WFL for tasks assessed/performed, Impulsive Overall Cognitive Status: Impaired/Different from baseline Area of Impairment: Problem solving, Awareness, Safety/judgement, Following commands, Memory, Attention, Orientation                 Orientation Level: Disoriented to, Situation Current Attention Level: Sustained Memory: Decreased recall of precautions, Decreased short-term memory Following Commands: Follows one step commands with increased time Safety/Judgement: Decreased awareness of safety, Decreased awareness of deficits Awareness: Emergent Problem Solving: Slow processing, Requires verbal cues, Requires tactile cues General Comments: Tangential. Easily distracted        Exercises General Exercises - Lower Extremity Ankle Circles/Pumps: AROM, Both, 10 reps Long Arc Quad: AROM, Right, Left, 5 reps Heel Slides: AAROM, Right, Left,  5 reps    General Comments General comments (skin integrity, edema, etc.): Discussed with nursing staff on unit benefits of obtaining bariatric drop arm BSC as pt  likely able to manage this transfer      Pertinent Vitals/Pain Pain Assessment Pain Assessment: Faces Faces Pain Scale: Hurts even more Pain Location: back Pain Descriptors / Indicators: Grimacing, Guarding Pain Intervention(s): Monitored during session, Repositioned    Home Living                          Prior Function            PT Goals (current goals can now be found in the care plan section) Acute Rehab PT Goals Patient Stated Goal: home Progress towards PT goals: Progressing toward goals    Frequency    Min 3X/week      PT Plan Discharge plan needs to be updated;Frequency needs to be updated    Co-evaluation              AM-PAC PT "6 Clicks" Mobility   Outcome Measure  Help needed turning from your back to your side while in a flat bed without using bedrails?: A Little Help needed moving from lying on your back to sitting on the side of a flat bed without using bedrails?: A Little Help needed moving to and from a bed to a chair (including a wheelchair)?: A Little Help needed standing up from a chair using your arms (e.g., wheelchair or bedside chair)?: A Lot   Help needed climbing 3-5 steps with a railing? : Total 6 Click Score: 12    End of Session   Activity Tolerance: Patient tolerated treatment well Patient left: in chair;with call bell/phone within reach;with chair alarm set Nurse Communication: Mobility status PT Visit Diagnosis: Muscle weakness (generalized) (M62.81);Pain;Difficulty in walking, not elsewhere classified (R26.2)     Time: 0940-7680 PT Time Calculation (min) (ACUTE ONLY): 18 min  Charges:  $Therapeutic Activity: 8-22 mins                     Lorrin Goodell, PT  Office # [phone] Pager [phone]    Lorriane Shire 12/13/2022, 10:10 AM

## 2022-12-13 NOTE — Progress Notes (Signed)
Occupational Therapy Treatment Patient Details Name: Hunter Reyes MRN: [id number] DOB: 07-20-1976 Today's Date: 12/13/2022   History of present illness 46 y.o. male admitted 12/10 with AMS, urinary retention and low back pain. Underwent L4 lumbar laminectomy for epidural abscess 12/13. s/p I &D right foot 12/17. PMH:  bipolar 1 disorder, borderline personality disorder, COPD, crack cocaine abuse, depression, peripheral neuropathy, history of right foot osteomyelitis, panic attack and history of seizures   OT comments  Pt with incremental progress towards OT goals, limited by pain, cognition and difficulty maintaining NWB RLE precautions. Focus on back precaution education in regards to LB dressing, transfer alternatives if NWB maintenance difficult. Overall, pt requires Min A for bed mobility and scoot transfer to drop arm recliner. Pt would likely be able to easily transfer to/from bariatric drop arm BSC if able to be located on unit. Continue to rec SNF rehab at [facility] as noted pt does not qualify for [facility].   Recommendations for follow up therapy are one component of a multi-disciplinary discharge planning process, led by the attending physician.  Recommendations may be updated based on patient status, additional functional criteria and insurance authorization.    Follow Up Recommendations  Skilled nursing-short term rehab (<3 hours/day)     Assistance Recommended at Discharge Intermittent Supervision/Assistance  Patient can return home with the following  A lot of help with bathing/dressing/bathroom;Assistance with cooking/housework;Help with stairs or ramp for entrance;Assist for transportation;A lot of help with walking and/or transfers   Equipment Recommendations  Wheelchair (measurements OT);Wheelchair cushion (measurements OT);Other (comment) (drop arm bariatric BSC)    Recommendations for Other Services      Precautions / Restrictions Precautions Precautions: Fall;Back Required  Braces or Orthoses: Other Brace Other Brace: post op shoe Restrictions Weight Bearing Restrictions: Yes RLE Weight Bearing: Non weight bearing       Mobility Bed Mobility Overal bed mobility: Needs Assistance Bed Mobility: Rolling, Sidelying to Sit Rolling: Supervision Sidelying to sit: Min assist, HOB elevated       General bed mobility comments: Min A to lift trunk to EOB, use of bedrails    Transfers Overall transfer level: Needs assistance Equipment used: None Transfers: Bed to chair/wheelchair/BSC            Lateral/Scoot Transfers: Min assist General transfer comment: Min A to scoot to drop arm recliner on R side, minor cues for sequencing, moderate cues for attention and NWB RLE with fair carryover     Balance Overall balance assessment: Needs assistance Sitting-balance support: Feet supported Sitting balance-[name] Scale: Fair                                     ADL either performed or assessed with clinical judgement   ADL Overall ADL's : Needs assistance/impaired     Grooming: Set up;Sitting;Wash/dry face               Lower Body Dressing: Moderate assistance;Sitting/lateral leans Lower Body Dressing Details (indicate cue type and reason): able to cross LEs w/ increased effort and assist to correct posterior bias to reach R foot to don darco shoe. Pt assisting with straps but difficulty managing without assist               General ADL Comments: Educated on back precautions, alternative transfers strategies to avoid WB through RLE    Extremity/Trunk Assessment Upper Extremity Assessment Upper Extremity Assessment: Overall WFL for tasks  assessed   Lower Extremity Assessment Lower Extremity Assessment: Defer to PT evaluation        Vision   Vision Assessment?: No apparent visual deficits   Perception     Praxis      Cognition Arousal/Alertness: Awake/alert Behavior During Therapy: WFL for tasks assessed/performed,  Impulsive Overall Cognitive Status: Impaired/Different from baseline Area of Impairment: Problem solving, Awareness, Safety/judgement, Following commands, Memory, Attention, Orientation                 Orientation Level: Disoriented to, Situation Current Attention Level: Sustained Memory: Decreased recall of precautions, Decreased short-term memory Following Commands: Follows one step commands with increased time Safety/Judgement: Decreased awareness of safety, Decreased awareness of deficits Awareness: Emergent Problem Solving: Slow processing, Requires verbal cues, Requires tactile cues General Comments: participatory, easily distracted, memory deficits and decreased recall of precautions. follows directions consistently and easily redirectable        Exercises      Shoulder Instructions       General Comments Discussed with nursing staff on unit benefits of obtaining bariatric drop arm BSC as pt likely able to manage this transfer    Pertinent Vitals/ Pain       Pain Assessment Pain Assessment: Faces Faces Pain Scale: Hurts even more Pain Location: back Pain Descriptors / Indicators: Grimacing, Guarding Pain Intervention(s): Monitored during session  Home Living                                          Prior Functioning/Environment              Frequency  Min 2X/week        Progress Toward Goals  OT Goals(current goals can now be found in the care plan section)  Progress towards OT goals: Progressing toward goals  Acute Rehab OT Goals Patient Stated Goal: pain control OT Goal Formulation: With patient Time For Goal Achievement: 12/24/22 Potential to Achieve Goals: Fair ADL Goals Pt Will Perform Grooming: with modified independence;standing Pt Will Perform Lower Body Dressing: with modified independence;sit to/from stand Pt Will Transfer to Toilet: stand pivot transfer;bedside commode;with min assist Pt Will Perform Tub/Shower  Transfer: Tub transfer;Stand pivot transfer;with min assist;tub bench Additional ADL Goal #1: Patient will maintain NWB on RLE and spinal precautions throughout ADLs.  Plan Discharge plan remains appropriate    Co-evaluation                 AM-PAC OT "6 Clicks" Daily Activity     Outcome Measure   Help from another person eating meals?: None Help from another person taking care of personal grooming?: A Little Help from another person toileting, which includes using toliet, bedpan, or urinal?: A Lot Help from another person bathing (including washing, rinsing, drying)?: A Lot Help from another person to put on and taking off regular upper body clothing?: A Little Help from another person to put on and taking off regular lower body clothing?: A Lot 6 Click Score: 16    End of Session    OT Visit Diagnosis: Unsteadiness on feet (R26.81);Muscle weakness (generalized) (M62.81);Pain;Other symptoms and signs involving cognitive function;Other abnormalities of gait and mobility (R26.89)   Activity Tolerance Patient tolerated treatment well;Patient limited by pain   Patient Left in chair;Other (comment) (with PT)   Nurse Communication Mobility status        Time: 5701-7793 OT Time Calculation (  min): 25 min  Charges: OT General Charges $OT Visit: 1 Visit OT Treatments $Self Care/Home Management : 8-22 mins  Malachy Chamber, OTR/L Acute Rehab Services Office: [phone]   Layla Maw 12/13/2022, 8:19 AM

## 2022-12-13 NOTE — Progress Notes (Signed)
PROGRESS NOTE        PATIENT DETAILS Name: Hunter Reyes Age: 46 y.o. Sex: male Date of Birth: 1976-02-09 Admit Date: 12/02/2022 Admitting Physician [name], MD PPI:RJJO, Edwinna Areola, MD  Brief Summary: Patient is a 46 y.o.  male with history of bipolar disorder, crack cocaine use, peripheral neuropathy-who was transferred from [facility] for AKI/urinary retention-upon further evaluation-he was found to have MRSA bacteremia with right foot abscess requiring TMA, lumbar discitis/abscess-requiring emergent laminectomy.  See below for further details.  Significant events: 12/10>> admit to [facility] at [facility]-AKI-urinary retention.  Plans to transfer to [facility]. 12/13>> laminectomy/decompression of ventral lumbar epidural abscess 12/17>> transmetatarsal amputation of right foot  Significant studies: 12/11>> CT renal stone study: Possible discitis L2-L3/L3-L4, moderate bilateral hydronephrosis/hydroureter 12/13>> MRI L-spine: Discitis/osteomyelitis at L3-L4 with 9 mm thick ventral canal phlegmon/abscess which extends from L3 to L4-L5.  Multiple paraspinal abscesses within the psoas musculature. 12/13>> echo: EF 60-65%, no evidence of valvular vegetations. 12/14>> MRI left foot: No osteomyelitis/abscess 12/15>> MRI right foot: Abscess with osteomyelitis of second/third metatarsals.  Significant microbiology data: 12/10>> COVID/influenza PCR: Negative 12/11>> blood culture: MRSA 12/13>> blood culture: MRSA 12/13>> epidural abscess culture: MRSA 12/14>> blood culture: No growth  Procedures: 12/13>> right L4 laminectomy-sublaminar decompression of ventral epidural abscess. 12/17>> transmetatarsal amputation of right foot  Consults: ID Neurosurgery  Subjective: Awake/alert-some pain at the operative site in his back.  Was getting up and working with physical therapy when I saw him earlier this morning.  Objective: Vitals: Blood pressure (!) 106/54, pulse 80, temperature  97.7 F (36.5 C), temperature source Oral, resp. rate 17, height 6\' 2"  (1.88 m), weight (!) 149 kg, SpO2 98 %.   Exam: Gen Exam:Alert awake-not in any distress HEENT:atraumatic, normocephalic Chest: B/L clear to auscultation anteriorly CVS:S1S2 regular Abdomen:soft non tender, non distended Extremities:no edema Neurology: Non focal Skin: no rash  Pertinent Labs/Radiology:    Latest Ref Rng & Units 12/11/2022    4:53 AM 12/10/2022    4:53 AM 12/09/2022    2:01 AM  CBC  WBC 4.0 - 10.5 K/uL 13.8  15.2  20.8   Hemoglobin 13.0 - 17.0 g/dL 8.2  8.6  9.5   Hematocrit 39.0 - 52.0 % 24.5  25.7  28.5   Platelets 150 - 400 K/uL 304  280  274     Lab Results  Component Value Date   NA 137 12/13/2022   K 4.2 12/13/2022   CL 106 12/13/2022   CO2 23 12/13/2022      Assessment/Plan: AKI due to obstructive uropathy Urinary retention Creatinine significantly improved-but seems to have plateaued around 1.6-1.9 range Voiding trial Foley catheter was discontinued earlier this morning.  MRSA bacteremia L3-L4 discitis with epidural abscess-s/p laminectomy/decompression on 12/13 Right foot osteomyelitis/abscess-s/p transmetatarsal amputation on 12/17 Overall improved-repeat cultures negative so far Orthopedics/neurosurgery following-able to lift both lower extremities off the bed. Continue to work with PT/OT ID recommending 8 weeks of IV Daptomycin-end date 02/03/2023, repeat MRI L spine on 01/02/23- and to reconsult ID around 1/10 to revisit  antibiotic plan following repeat imaging Given history of drug use-unfortunately will remain inpatient to complete IV antibiotic duration.  Not a candidate for home therapies.  Peripheral neuropathy Continue Neurontin  Bipolar 1 disorder Stable Continue Abilify  Polysubstance abuse History of cocaine use Remains on Suboxone  Chronic hepatitis B infection  defer further to infectious disease  Morbid Obesity: Estimated body mass index is  42.18 kg/m as calculated from the following:   Height as of this encounter: 6\' 2"  (1.88 m).   Weight as of this encounter: 149 kg.   Code status:   Code Status: Full Code   DVT Prophylaxis: SCD's Start: 12/09/22 1137 Prophylactic Lovenox   Family Communication: None at bedside   Disposition Plan: Status is: Inpatient Remains inpatient appropriate because: Severity of illness   Planned Discharge Destination:Home ultimately-but will need to remain inpatient to complete IV daptomycin-end date 02/04/20   Diet: Diet Order             Diet Carb Modified Fluid consistency: Thin; Room service appropriate? Yes  Diet effective now                     Antimicrobial agents: Anti-infectives (From admission, onward)    Start     Dose/Rate Route Frequency Ordered Stop   12/05/22 1800  DAPTOmycin (CUBICIN) 850 mg in sodium chloride 0.9 % IVPB        8 mg/kg  108.9 kg (Adjusted) 134 mL/hr over 30 Minutes Intravenous Daily 12/05/22 1654 02/03/23 2359   12/05/22 1200  DAPTOmycin (CUBICIN) 850 mg in sodium chloride 0.9 % IVPB  Status:  Discontinued        8 mg/kg  108.9 kg (Adjusted) 134 mL/hr over 30 Minutes Intravenous Daily 12/05/22 0923 12/05/22 1654   12/05/22 0800  vancomycin (VANCOREADY) IVPB 2000 mg/400 mL  Status:  Discontinued        2,000 mg 200 mL/hr over 120 Minutes Intravenous Every 24 hours 12/05/22 0632 12/05/22 0923   12/04/22 2200  ceFEPIme (MAXIPIME) 2 g in sodium chloride 0.9 % 100 mL IVPB  Status:  Discontinued        2 g 200 mL/hr over 30 Minutes Intravenous Every 12 hours 12/04/22 1242 12/05/22 0237   12/04/22 0600  ceFEPIme (MAXIPIME) 1 g in sodium chloride 0.9 % 100 mL IVPB  Status:  Discontinued        1 g 200 mL/hr over 30 Minutes Intravenous Every 24 hours 12/03/22 0508 12/04/22 1242   12/03/22 1100  ceFEPIme (MAXIPIME) 2 g in sodium chloride 0.9 % 100 mL IVPB        2 g 200 mL/hr over 30 Minutes Intravenous  Once 12/03/22 0418 12/03/22 1120    12/03/22 0508  vancomycin variable dose per unstable renal function (pharmacist dosing)  Status:  Discontinued         Does not apply See admin instructions 12/03/22 0508 12/05/22 0849   12/03/22 0500  metroNIDAZOLE (FLAGYL) IVPB 500 mg  Status:  Discontinued        500 mg 100 mL/hr over 60 Minutes Intravenous Every 12 hours 12/03/22 0418 12/05/22 0237   12/03/22 0430  vancomycin (VANCOCIN) IVPB 1000 mg/200 mL premix  Status:  Discontinued        1,000 mg 200 mL/hr over 60 Minutes Intravenous  Once 12/03/22 0418 12/03/22 0421   12/03/22 0415  vancomycin (VANCOREADY) IVPB 2000 mg/400 mL        2,000 mg 200 mL/hr over 120 Minutes Intravenous  Once 12/03/22 0404 12/03/22 0630   12/03/22 0200  cefTRIAXone (ROCEPHIN) 2 g in sodium chloride 0.9 % 100 mL IVPB        2 g 200 mL/hr over 30 Minutes Intravenous  Once 12/03/22 0153 12/03/22 0238        MEDICATIONS:  Scheduled Meds:  vitamin C  1,000 mg Oral Daily   buprenorphine-naloxone  1 tablet Sublingual BID   Chlorhexidine Gluconate Cloth  6 each Topical Q0600   docusate sodium  100 mg Oral Daily   enoxaparin (LOVENOX) injection  75 mg Subcutaneous Q24H   gabapentin  600 mg Oral BID   LORazepam  1 mg Intravenous Once   nutrition supplement (JUVEN)  1 packet Oral BID BM   pantoprazole  40 mg Oral Daily   polyethylene glycol  17 g Oral Daily   sertraline  25 mg Oral Daily   thiamine  500 mg Oral Daily   zinc sulfate  220 mg Oral Daily   Continuous Infusions:  DAPTOmycin (CUBICIN) 850 mg in sodium chloride 0.9 % IVPB 850 mg (12/12/22 2100)   magnesium sulfate bolus IVPB     PRN Meds:.acetaminophen **OR** acetaminophen, acetaminophen, albuterol, alum & mag hydroxide-simeth, bisacodyl, guaiFENesin-dextromethorphan, hydrALAZINE, HYDROmorphone (DILAUDID) injection, labetalol, magnesium citrate, magnesium sulfate bolus IVPB, metoprolol tartrate, ondansetron **OR** ondansetron (ZOFRAN) IV, ondansetron, oxyCODONE, oxyCODONE, phenol, polyethylene  glycol, potassium chloride, tiZANidine, traZODone   I have personally reviewed following labs and imaging studies  LABORATORY DATA: CBC: Recent Labs  Lab 12/07/22 0311 12/08/22 0224 12/09/22 0201 12/10/22 0453 12/11/22 0453  WBC 25.1* 22.5* 20.8* 15.2* 13.8*  HGB 10.4* 10.2* 9.5* 8.6* 8.2*  HCT 32.1* 30.7* 28.5* 25.7* 24.5*  MCV 88.7 87.7 87.7 87.4 87.5  PLT 289 283 274 280 304     Basic Metabolic Panel: Recent Labs  Lab 12/09/22 0211 12/10/22 0453 12/11/22 0453 12/12/22 0321 12/13/22 0431  NA 134* 138 136 140 137  K 3.9 3.7 4.2 4.1 4.2  CL 102 106 104 107 106  CO2 20* 23 24 23 23   GLUCOSE 96 104* 93 96 94  BUN 52* 54* 53* 45* 39*  CREATININE 1.85* 1.60* 1.83* 1.74* 1.91*  CALCIUM 7.8* 7.9* 8.2* 7.9* 7.9*  PHOS 5.4* 5.0* 4.3 5.1* 5.6*     GFR: Estimated Creatinine Clearance: 75.2 mL/min (A) (by C-G formula based on SCr of 1.91 mg/dL (H)).  Liver Function Tests: Recent Labs  Lab 12/09/22 0211 12/10/22 0453 12/11/22 0453 12/12/22 0321 12/13/22 0431  ALBUMIN 1.8* 1.7* 1.8* 1.7* 1.8*    No results for input(s): "LIPASE", "AMYLASE" in the last 168 hours. No results for input(s): "AMMONIA" in the last 168 hours.  Coagulation Profile: No results for input(s): "INR", "PROTIME" in the last 168 hours.  Cardiac Enzymes: Recent Labs  Lab 12/12/22 0321 12/13/22 0431  CKTOTAL 90 84     BNP (last 3 results) No results for input(s): "PROBNP" in the last 8760 hours.  Lipid Profile: No results for input(s): "CHOL", "HDL", "LDLCALC", "TRIG", "CHOLHDL", "LDLDIRECT" in the last 72 hours.  Thyroid Function Tests: No results for input(s): "TSH", "T4TOTAL", "FREET4", "T3FREE", "THYROIDAB" in the last 72 hours.  Anemia Panel: No results for input(s): "VITAMINB12", "FOLATE", "FERRITIN", "TIBC", "IRON", "RETICCTPCT" in the last 72 hours.  Urine analysis:    Component Value Date/Time   COLORURINE AMBER (A) 12/02/2022 2325   APPEARANCEUR CLOUDY (A) 12/02/2022  2325   LABSPEC 1.012 12/02/2022 2325   PHURINE 7.0 12/02/2022 2325   GLUCOSEU NEGATIVE 12/02/2022 2325   HGBUR MODERATE (A) 12/02/2022 2325   BILIRUBINUR NEGATIVE 12/02/2022 2325   KETONESUR NEGATIVE 12/02/2022 2325   PROTEINUR 100 (A) 12/02/2022 2325   UROBILINOGEN 0.2 04/30/2013 1416   NITRITE NEGATIVE 12/02/2022 2325   LEUKOCYTESUR LARGE (A) 12/02/2022 2325    Sepsis Labs: Lactic Acid, Venous  Component Value Date/Time   LATICACIDVEN 1.2 12/03/2022 0007    MICROBIOLOGY: Recent Results (from the past 240 hour(s))  Culture, blood (Routine X 2) w Reflex to ID Panel     Status: Abnormal   Collection Time: 12/05/22  9:12 AM   Specimen: BLOOD  Result Value Ref Range Status   Specimen Description BLOOD LEFT ANTECUBITAL  Final   Special Requests   Final    BOTTLES DRAWN AEROBIC AND ANAEROBIC Blood Culture adequate volume   Culture  Setup Time   Final    GRAM POSITIVE COCCI IN CLUSTERS IN BOTH AEROBIC AND ANAEROBIC BOTTLES CRITICAL RESULT CALLED TO, READ BACK BY AND VERIFIED WITHFerne Coe PHARMD, AT 1334 12/06/22 D. VANHOOK    Culture (A)  Final    STAPHYLOCOCCUS SPECIES (COAGULASE NEGATIVE) STAPHYLOCOCCUS AUREUS SUSCEPTIBILITIES PERFORMED ON PREVIOUS CULTURE WITHIN THE LAST 5 DAYS. Performed at [facility], [place] [address] [place], [place] [address]    Report Status 12/08/2022 FINAL  Final   Organism ID, Bacteria STAPHYLOCOCCUS SPECIES (COAGULASE NEGATIVE)  Final      Susceptibility   Staphylococcus species (coagulase negative) - MIC*    CIPROFLOXACIN >=8 RESISTANT Resistant     ERYTHROMYCIN RESISTANT Resistant     GENTAMICIN <=0.5 SENSITIVE Sensitive     OXACILLIN >=4 RESISTANT Resistant     TETRACYCLINE <=1 SENSITIVE Sensitive     VANCOMYCIN <=0.5 SENSITIVE Sensitive     TRIMETH/SULFA <=10 SENSITIVE Sensitive     CLINDAMYCIN RESISTANT Resistant     RIFAMPIN <=0.5 SENSITIVE Sensitive     Inducible Clindamycin POSITIVE Resistant     * STAPHYLOCOCCUS SPECIES  (COAGULASE NEGATIVE)  Culture, blood (Routine X 2) w Reflex to ID Panel     Status: Abnormal   Collection Time: 12/05/22  9:15 AM   Specimen: BLOOD LEFT WRIST  Result Value Ref Range Status   Specimen Description BLOOD LEFT WRIST  Final   Special Requests   Final    BOTTLES DRAWN AEROBIC AND ANAEROBIC Blood Culture adequate volume   Culture  Setup Time   Final    GRAM POSITIVE COCCI IN CLUSTERS AEROBIC BOTTLE ONLY CRITICAL VALUE NOTED.  VALUE IS CONSISTENT WITH PREVIOUSLY REPORTED AND CALLED VALUE.    Culture (A)  Final    STAPHYLOCOCCUS SPECIES (COAGULASE NEGATIVE) SUSCEPTIBILITIES PERFORMED ON PREVIOUS CULTURE WITHIN THE LAST 5 DAYS. Performed at [facility], [place] [address], [place], [place] [address]    Report Status 12/08/2022 FINAL  Final  Aerobic/Anaerobic Culture w Gram Stain (surgical/deep wound)     Status: None   Collection Time: 12/05/22  7:52 PM   Specimen: Abscess  Result Value Ref Range Status   Specimen Description ABSCESS  Final   Special Requests LUMBAR 4 EPIDURAL SPACE  Final   Gram Stain   Final    RARE WBC PRESENT, PREDOMINANTLY PMN NO ORGANISMS SEEN    Culture   Final    RARE METHICILLIN RESISTANT STAPHYLOCOCCUS AUREUS NO ANAEROBES ISOLATED Performed at [facility], [place] [address] [place], [place] [address]    Report Status 12/10/2022 FINAL  Final   Organism ID, Bacteria METHICILLIN RESISTANT STAPHYLOCOCCUS AUREUS  Final      Susceptibility   Methicillin resistant staphylococcus aureus - MIC*    CIPROFLOXACIN >=8 RESISTANT Resistant     ERYTHROMYCIN >=8 RESISTANT Resistant     GENTAMICIN <=0.5 SENSITIVE Sensitive     OXACILLIN >=4 RESISTANT Resistant     TETRACYCLINE <=1 SENSITIVE Sensitive  VANCOMYCIN <=0.5 SENSITIVE Sensitive     TRIMETH/SULFA <=10 SENSITIVE Sensitive     CLINDAMYCIN <=0.25 SENSITIVE Sensitive     RIFAMPIN <=0.5 SENSITIVE Sensitive     Inducible Clindamycin NEGATIVE Sensitive     * RARE METHICILLIN RESISTANT  STAPHYLOCOCCUS AUREUS  Culture, blood (Routine X 2) w Reflex to ID Panel     Status: None   Collection Time: 12/06/22  3:09 PM   Specimen: BLOOD LEFT HAND  Result Value Ref Range Status   Specimen Description BLOOD LEFT HAND  Final   Special Requests   Final    BOTTLES DRAWN AEROBIC ONLY Blood Culture adequate volume   Culture   Final    NO GROWTH 5 DAYS Performed at [facility], [place] [address], [place], [place] [address]    Report Status 12/11/2022 FINAL  Final  Culture, blood (Routine X 2) w Reflex to ID Panel     Status: None   Collection Time: 12/06/22  3:10 PM   Specimen: BLOOD RIGHT HAND  Result Value Ref Range Status   Specimen Description BLOOD RIGHT HAND  Final   Special Requests   Final    BOTTLES DRAWN AEROBIC AND ANAEROBIC Blood Culture adequate volume   Culture   Final    NO GROWTH 5 DAYS Performed at [facility], [place] [address], [place], [place] [address]    Report Status 12/11/2022 FINAL  Final    RADIOLOGY STUDIES/RESULTS: No results found.   LOS: 10 days   Oren Binet, MD  [facility]    To contact the attending provider between 7A-7P or the covering provider during after hours 7P-7A, please log into the web site [email or web address] and access using universal [place] password for that web site. If you do not have the password, please call the hospital operator.  12/13/2022, 11:33 AM

## 2022-12-14 DIAGNOSIS — B9562 Methicillin resistant Staphylococcus aureus infection as the cause of diseases classified elsewhere: Secondary | ICD-10-CM | POA: Diagnosis not present

## 2022-12-14 DIAGNOSIS — R7881 Bacteremia: Secondary | ICD-10-CM | POA: Diagnosis not present

## 2022-12-14 LAB — RENAL FUNCTION PANEL
Albumin: 1.8 g/dL — ABNORMAL LOW (ref 3.5–5.0)
Anion gap: 10 (ref 5–15)
BUN: 51 mg/dL — ABNORMAL HIGH (ref 6–20)
CO2: 22 mmol/L (ref 22–32)
Calcium: 8.3 mg/dL — ABNORMAL LOW (ref 8.9–10.3)
Chloride: 101 mmol/L (ref 98–111)
Creatinine, Ser: 3.46 mg/dL — ABNORMAL HIGH (ref 0.61–1.24)
GFR, Estimated: 21 mL/min — ABNORMAL LOW (ref >60)
Glucose, Bld: 103 mg/dL — ABNORMAL HIGH (ref 70–99)
Phosphorus: 5.9 mg/dL — ABNORMAL HIGH (ref 2.5–4.6)
Potassium: 5.1 mmol/L (ref 3.5–5.1)
Sodium: 133 mmol/L — ABNORMAL LOW (ref 135–145)

## 2022-12-14 LAB — CBC
HCT: 23.3 % — ABNORMAL LOW (ref 39.0–52.0)
Hemoglobin: 7.7 g/dL — ABNORMAL LOW (ref 13.0–17.0)
MCH: 28.9 pg (ref 26.0–34.0)
MCHC: 33 g/dL (ref 30.0–36.0)
MCV: 87.6 fL (ref 80.0–100.0)
Platelets: 345 10*3/uL (ref 150–400)
RBC: 2.66 MIL/uL — ABNORMAL LOW (ref 4.22–5.81)
RDW: 13.4 % (ref 11.5–15.5)
WBC: 8.9 10*3/uL (ref 4.0–10.5)
nRBC: 0 % (ref 0.0–0.2)

## 2022-12-14 MED ORDER — TAMSULOSIN HCL 0.4 MG PO CAPS
0.4000 mg | ORAL_CAPSULE | Freq: Every day | ORAL | Status: DC
  Administered 2022-12-14 – 2023-01-21 (×39): 0.4 mg via ORAL
  Filled 2022-12-14 (×41): qty 1

## 2022-12-14 NOTE — Progress Notes (Addendum)
PROGRESS NOTE        PATIENT DETAILS Name: Hunter Reyes Age: 46 y.o. Sex: male Date of Birth: 12-Sep-1976 Admit Date: 12/02/2022 Admitting Physician [name], MD [id number], Edwinna Areola, MD  Brief Summary: Patient is a 46 y.o.  male with history of bipolar disorder, crack cocaine use, peripheral neuropathy-who was transferred from [facility] for AKI/urinary retention-upon further evaluation-he was found to have MRSA bacteremia with right foot abscess requiring TMA, lumbar discitis/abscess-requiring emergent laminectomy.  See below for further details.- -Very poor dentition/dental caries noted  Significant events: 12/10>> admit to [facility] at AP-AKI-urinary retention.  Plans to transfer to [facility]. 12/13>> laminectomy/decompression of ventral lumbar epidural abscess 12/17>> transmetatarsal amputation of right foot  Significant studies: 12/11>> CT renal stone study: Possible discitis L2-L3/L3-L4, moderate bilateral hydronephrosis/hydroureter 12/13>> MRI L-spine: Discitis/osteomyelitis at L3-L4 with 9 mm thick ventral canal phlegmon/abscess which extends from L3 to L4-L5.  Multiple paraspinal abscesses within the psoas musculature. 12/13>> echo: EF 60-65%, no evidence of valvular vegetations. 12/14>> MRI left foot: No osteomyelitis/abscess 12/15>> MRI right foot: Abscess with osteomyelitis of second/third metatarsals.  Significant microbiology data: 12/10>> COVID/influenza PCR: Negative 12/11>> blood culture: MRSA 12/13>> blood culture: MRSA 12/13>> epidural abscess culture: MRSA 12/14>> blood culture: No growth  Procedures: 12/13>> right L4 laminectomy-sublaminar decompression of ventral epidural abscess. 12/17>> transmetatarsal amputation of right foot  Consults: ID Neurosurgery  Subjective: - No fevers no chills -Having difficulties with voiding--required in and out catheterization  Objective: Vitals: Blood pressure (!) 96/48, pulse 88, temperature 98.9 F  (37.2 C), temperature source Oral, resp. rate 16, height 6\' 2"  (1.88 m), weight (!) 149 kg, SpO2 94 %.   Exam: Gen Exam:Alert awake-not in any distress HEENT:atraumatic, normocephalic, Very poor dentition/dental caries noted Chest: B/L clear to auscultation anteriorly CVS:S1S2 regular Abdomen:soft non tender, non distended Extremities:no edema Neurology: Non focal Skin: no rash  Pertinent Labs/Radiology:    Latest Ref Rng & Units 12/14/2022    4:18 AM 12/11/2022    4:53 AM 12/10/2022    4:53 AM  CBC  WBC 4.0 - 10.5 K/uL 8.9  13.8  15.2   Hemoglobin 13.0 - 17.0 g/dL 7.7  8.2  8.6   Hematocrit 39.0 - 52.0 % 23.3  24.5  25.7   Platelets 150 - 400 K/uL 345  304  280     Lab Results  Component Value Date   NA 133 (L) 12/14/2022   K 5.1 12/14/2022   CL 101 12/14/2022   CO2 22 12/14/2022      Assessment/Plan: 1)- obstructive uropathy/Urinary retention Suspect some degree of BPH with LUTs -initially improved  -Urinary issues appears to be worsening again after removing Foley,  -creatinine initially plateaued around 1.6-1.9 range -Foley removed recently -Now having difficulty with voiding -Give Flomax -In and out catheterization -May need indwelling Foley again if fails voiding trial -Check renal ultrasound  2)AKI----acute kidney injury Vs AKi on CKD stage -    creatinine on admission=20.99 (12/03/22)  ,  baseline creatinine = previously normal renal function 5 years ago no recent renal function available -Creatinine improved to two 1.9 on 12/13/2022 however creatinine has bumped to 3.46 as of 12/14/2022 with patient developing urinary retention/outflow problems -Check renal ultrasound -Foley and Flomax as above #1 - renally adjust medications, avoid nephrotoxic agents / dehydration  / hypotension   3)MRSA bacteremia L3-L4 discitis with  epidural abscess-s/p laminectomy/decompression on 12/13 Right foot osteomyelitis/abscess-s/p transmetatarsal amputation on  12/17 Overall improved-repeat cultures negative so far Orthopedics/neurosurgery following-able to lift both lower extremities off the bed. Continue to work with PT/OT ID recommending 8 weeks of IV Daptomycin-end date 02/03/2023, repeat MRI L spine on 01/02/23- and to reconsult ID around 01/02/2023 to revisit  antibiotic plan following repeat imaging Given history of drug use-unfortunately will remain inpatient to complete IV antibiotic duration.  Not a candidate for home therapies. -SNF facilities will not take patient due to drug use  4) acute anemia--multifactorial etiology  -Hgb was 13 on admission on 12/03/2022 -Hgb drifting down -Transfuse if hemodynamically unstable or Hgb below 7 -Bleeding concerns at this time  5)Very poor dentition/dental caries noted--- outpatient follow-up with dentist advised  Peripheral neuropathy Continue Neurontin  Bipolar 1 disorder Stable Continue Abilify  Polysubstance abuse History of cocaine use Remains on Suboxone -UDS was actually negative on admission on 12/03/2022  Chronic hepatitis B infection defer further to infectious disease  Morbid Obesity: Estimated body mass index is 42.18 kg/m as calculated from the following:   Height as of this encounter: 6\' 2"  (1.88 m).   Weight as of this encounter: 149 kg.   Code status:   Code Status: Full Code   DVT Prophylaxis: SCD's Start: 12/09/22 1137 Prophylactic Lovenox   Family Communication: None at bedside   Disposition Plan: Status is: Inpatient Remains inpatient appropriate because: Severity of illness   Planned Discharge Destination:Home ultimately-but will need to remain inpatient to complete IV daptomycin-end date 02/03/2023 - Will be difficult to place as not a candidate for home with PICC line due to his drug use, and no SNF will take him with his drug use -Discharge home on 02/03/2023 after he completes IV antibiotics   Diet: Diet Order             Diet Carb Modified  Fluid consistency: Thin; Room service appropriate? Yes  Diet effective now                     Antimicrobial agents: Anti-infectives (From admission, onward)    Start     Dose/Rate Route Frequency Ordered Stop   12/05/22 1800  DAPTOmycin (CUBICIN) 850 mg in sodium chloride 0.9 % IVPB        8 mg/kg  108.9 kg (Adjusted) 134 mL/hr over 30 Minutes Intravenous Daily 12/05/22 1654 02/03/23 2359   12/05/22 1200  DAPTOmycin (CUBICIN) 850 mg in sodium chloride 0.9 % IVPB  Status:  Discontinued        8 mg/kg  108.9 kg (Adjusted) 134 mL/hr over 30 Minutes Intravenous Daily 12/05/22 0923 12/05/22 1654   12/05/22 0800  vancomycin (VANCOREADY) IVPB 2000 mg/400 mL  Status:  Discontinued        2,000 mg 200 mL/hr over 120 Minutes Intravenous Every 24 hours 12/05/22 0632 12/05/22 0923   12/04/22 2200  ceFEPIme (MAXIPIME) 2 g in sodium chloride 0.9 % 100 mL IVPB  Status:  Discontinued        2 g 200 mL/hr over 30 Minutes Intravenous Every 12 hours 12/04/22 1242 12/05/22 0237   12/04/22 0600  ceFEPIme (MAXIPIME) 1 g in sodium chloride 0.9 % 100 mL IVPB  Status:  Discontinued        1 g 200 mL/hr over 30 Minutes Intravenous Every 24 hours 12/03/22 0508 12/04/22 1242   12/03/22 1100  ceFEPIme (MAXIPIME) 2 g in sodium chloride 0.9 % 100 mL IVPB  2 g 200 mL/hr over 30 Minutes Intravenous  Once 12/03/22 0418 12/03/22 1120   12/03/22 0508  vancomycin variable dose per unstable renal function (pharmacist dosing)  Status:  Discontinued         Does not apply See admin instructions 12/03/22 0508 12/05/22 0849   12/03/22 0500  metroNIDAZOLE (FLAGYL) IVPB 500 mg  Status:  Discontinued        500 mg 100 mL/hr over 60 Minutes Intravenous Every 12 hours 12/03/22 0418 12/05/22 0237   12/03/22 0430  vancomycin (VANCOCIN) IVPB 1000 mg/200 mL premix  Status:  Discontinued        1,000 mg 200 mL/hr over 60 Minutes Intravenous  Once 12/03/22 0418 12/03/22 0421   12/03/22 0415  vancomycin (VANCOREADY) IVPB  2000 mg/400 mL        2,000 mg 200 mL/hr over 120 Minutes Intravenous  Once 12/03/22 0404 12/03/22 0630   12/03/22 0200  cefTRIAXone (ROCEPHIN) 2 g in sodium chloride 0.9 % 100 mL IVPB        2 g 200 mL/hr over 30 Minutes Intravenous  Once 12/03/22 0153 12/03/22 0238        MEDICATIONS: Scheduled Meds:  vitamin C  1,000 mg Oral Daily   buprenorphine-naloxone  1 tablet Sublingual BID   Chlorhexidine Gluconate Cloth  6 each Topical Q0600   docusate sodium  100 mg Oral Daily   enoxaparin (LOVENOX) injection  75 mg Subcutaneous Q24H   gabapentin  600 mg Oral BID   LORazepam  1 mg Intravenous Once   nutrition supplement (JUVEN)  1 packet Oral BID BM   pantoprazole  40 mg Oral Daily   polyethylene glycol  17 g Oral Daily   sertraline  25 mg Oral Daily   tamsulosin  0.4 mg Oral QPC supper   thiamine  500 mg Oral Daily   zinc sulfate  220 mg Oral Daily   Continuous Infusions:  DAPTOmycin (CUBICIN) 850 mg in sodium chloride 0.9 % IVPB 850 mg (12/13/22 2043)   magnesium sulfate bolus IVPB     PRN Meds:.acetaminophen **OR** acetaminophen, acetaminophen, albuterol, alum & mag hydroxide-simeth, bisacodyl, guaiFENesin-dextromethorphan, hydrALAZINE, HYDROmorphone (DILAUDID) injection, labetalol, magnesium citrate, magnesium sulfate bolus IVPB, metoprolol tartrate, ondansetron **OR** ondansetron (ZOFRAN) IV, ondansetron, oxyCODONE, oxyCODONE, phenol, polyethylene glycol, potassium chloride, tiZANidine, traZODone   I have personally reviewed following labs and imaging studies  LABORATORY DATA: CBC: Recent Labs  Lab 12/08/22 0224 12/09/22 0201 12/10/22 0453 12/11/22 0453 12/14/22 0418  WBC 22.5* 20.8* 15.2* 13.8* 8.9  HGB 10.2* 9.5* 8.6* 8.2* 7.7*  HCT 30.7* 28.5* 25.7* 24.5* 23.3*  MCV 87.7 87.7 87.4 87.5 87.6  PLT 283 274 280 304 157    Basic Metabolic Panel: Recent Labs  Lab 12/10/22 0453 12/11/22 0453 12/12/22 0321 12/13/22 0431 12/14/22 0418  NA 138 136 140 137 133*   K 3.7 4.2 4.1 4.2 5.1  CL 106 104 107 106 101  CO2 23 24 23 23 22   GLUCOSE 104* 93 96 94 103*  BUN 54* 53* 45* 39* 51*  CREATININE 1.60* 1.83* 1.74* 1.91* 3.46*  CALCIUM 7.9* 8.2* 7.9* 7.9* 8.3*  PHOS 5.0* 4.3 5.1* 5.6* 5.9*    GFR: Estimated Creatinine Clearance: 41.5 mL/min (A) (by C-G formula based on SCr of 3.46 mg/dL (H)).  Liver Function Tests: Recent Labs  Lab 12/10/22 0453 12/11/22 0453 12/12/22 0321 12/13/22 0431 12/14/22 0418  ALBUMIN 1.7* 1.8* 1.7* 1.8* 1.8*   No results for input(s): "LIPASE", "AMYLASE" in the last  168 hours. No results for input(s): "AMMONIA" in the last 168 hours.  Coagulation Profile: No results for input(s): "INR", "PROTIME" in the last 168 hours.  Cardiac Enzymes: Recent Labs  Lab 12/12/22 0321 12/13/22 0431  CKTOTAL 90 84    Urine analysis:    Component Value Date/Time   COLORURINE AMBER (A) 12/02/2022 2325   APPEARANCEUR CLOUDY (A) 12/02/2022 2325   LABSPEC 1.012 12/02/2022 2325   PHURINE 7.0 12/02/2022 2325   GLUCOSEU NEGATIVE 12/02/2022 2325   HGBUR MODERATE (A) 12/02/2022 2325   BILIRUBINUR NEGATIVE 12/02/2022 2325   KETONESUR NEGATIVE 12/02/2022 2325   PROTEINUR 100 (A) 12/02/2022 2325   UROBILINOGEN 0.2 04/30/2013 1416   NITRITE NEGATIVE 12/02/2022 2325   LEUKOCYTESUR LARGE (A) 12/02/2022 2325    Sepsis Labs: Lactic Acid, Venous    Component Value Date/Time   LATICACIDVEN 1.2 12/03/2022 0007    MICROBIOLOGY: Recent Results (from the past 240 hour(s))  Culture, blood (Routine X 2) w Reflex to ID Panel     Status: Abnormal   Collection Time: 12/05/22  9:12 AM   Specimen: BLOOD  Result Value Ref Range Status   Specimen Description BLOOD LEFT ANTECUBITAL  Final   Special Requests   Final    BOTTLES DRAWN AEROBIC AND ANAEROBIC Blood Culture adequate volume   Culture  Setup Time   Final    GRAM POSITIVE COCCI IN CLUSTERS IN BOTH AEROBIC AND ANAEROBIC BOTTLES CRITICAL RESULT CALLED TO, READ BACK BY AND  VERIFIED WITH: E. [place], AT 1334 12/06/22 D. VANHOOK    Culture (A)  Final    STAPHYLOCOCCUS SPECIES (COAGULASE NEGATIVE) STAPHYLOCOCCUS AUREUS SUSCEPTIBILITIES PERFORMED ON PREVIOUS CULTURE WITHIN THE LAST 5 DAYS. Performed at [facility], [place] [address] [place], [place] [address]    Report Status 12/08/2022 FINAL  Final   Organism ID, Bacteria STAPHYLOCOCCUS SPECIES (COAGULASE NEGATIVE)  Final      Susceptibility   Staphylococcus species (coagulase negative) - MIC*    CIPROFLOXACIN >=8 RESISTANT Resistant     ERYTHROMYCIN RESISTANT Resistant     GENTAMICIN <=0.5 SENSITIVE Sensitive     OXACILLIN >=4 RESISTANT Resistant     TETRACYCLINE <=1 SENSITIVE Sensitive     VANCOMYCIN <=0.5 SENSITIVE Sensitive     TRIMETH/SULFA <=10 SENSITIVE Sensitive     CLINDAMYCIN RESISTANT Resistant     RIFAMPIN <=0.5 SENSITIVE Sensitive     Inducible Clindamycin POSITIVE Resistant     * STAPHYLOCOCCUS SPECIES (COAGULASE NEGATIVE)  Culture, blood (Routine X 2) w Reflex to ID Panel     Status: Abnormal   Collection Time: 12/05/22  9:15 AM   Specimen: BLOOD LEFT WRIST  Result Value Ref Range Status   Specimen Description BLOOD LEFT WRIST  Final   Special Requests   Final    BOTTLES DRAWN AEROBIC AND ANAEROBIC Blood Culture adequate volume   Culture  Setup Time   Final    GRAM POSITIVE COCCI IN CLUSTERS AEROBIC BOTTLE ONLY CRITICAL VALUE NOTED.  VALUE IS CONSISTENT WITH PREVIOUSLY REPORTED AND CALLED VALUE.    Culture (A)  Final    STAPHYLOCOCCUS SPECIES (COAGULASE NEGATIVE) SUSCEPTIBILITIES PERFORMED ON PREVIOUS CULTURE WITHIN THE LAST 5 DAYS. Performed at [facility], [place] [address], [place], [place] [address]    Report Status 12/08/2022 FINAL  Final  Aerobic/Anaerobic Culture w Gram Stain (surgical/deep wound)     Status: None   Collection Time: 12/05/22  7:52 PM   Specimen: Abscess  Result Value Ref Range Status  Specimen Description ABSCESS  Final   Special Requests  LUMBAR 4 EPIDURAL SPACE  Final   Gram Stain   Final    RARE WBC PRESENT, PREDOMINANTLY PMN NO ORGANISMS SEEN    Culture   Final    RARE METHICILLIN RESISTANT STAPHYLOCOCCUS AUREUS NO ANAEROBES ISOLATED Performed at [facility], [place] [address], [place], [place] [address]    Report Status 12/10/2022 FINAL  Final   Organism ID, Bacteria METHICILLIN RESISTANT STAPHYLOCOCCUS AUREUS  Final      Susceptibility   Methicillin resistant staphylococcus aureus - MIC*    CIPROFLOXACIN >=8 RESISTANT Resistant     ERYTHROMYCIN >=8 RESISTANT Resistant     GENTAMICIN <=0.5 SENSITIVE Sensitive     OXACILLIN >=4 RESISTANT Resistant     TETRACYCLINE <=1 SENSITIVE Sensitive     VANCOMYCIN <=0.5 SENSITIVE Sensitive     TRIMETH/SULFA <=10 SENSITIVE Sensitive     CLINDAMYCIN <=0.25 SENSITIVE Sensitive     RIFAMPIN <=0.5 SENSITIVE Sensitive     Inducible Clindamycin NEGATIVE Sensitive     * RARE METHICILLIN RESISTANT STAPHYLOCOCCUS AUREUS  Culture, blood (Routine X 2) w Reflex to ID Panel     Status: None   Collection Time: 12/06/22  3:09 PM   Specimen: BLOOD LEFT HAND  Result Value Ref Range Status   Specimen Description BLOOD LEFT HAND  Final   Special Requests   Final    BOTTLES DRAWN AEROBIC ONLY Blood Culture adequate volume   Culture   Final    NO GROWTH 5 DAYS Performed at [facility], [address] [place], [place] [address]    Report Status 12/11/2022 FINAL  Final  Culture, blood (Routine X 2) w Reflex to ID Panel     Status: None   Collection Time: 12/06/22  3:10 PM   Specimen: BLOOD RIGHT HAND  Result Value Ref Range Status   Specimen Description BLOOD RIGHT HAND  Final   Special Requests   Final    BOTTLES DRAWN AEROBIC AND ANAEROBIC Blood Culture adequate volume   Culture   Final    NO GROWTH 5 DAYS Performed at [facility], [place] [address], [place], [place] [address]    Report Status 12/11/2022 FINAL  Final    RADIOLOGY STUDIES/RESULTS: No results  found.   LOS: 11 days   Roxan Hockey, MD  [facility]    To contact the attending provider between 7A-7P or the covering provider during after hours 7P-7A, please log into the web site [email or web address] and access using universal [place] password for that web site. If you do not have the password, please call the hospital operator.  12/14/2022, 4:55 PM

## 2022-12-14 NOTE — Progress Notes (Signed)
Pt c/o wanting to void but unable to.  Unable to find documentation on urine output since 12/13/22 0200H.  Bladder scan = 576.  I&O cath =1000, dark amber urine, with sediments noted.  Dr. Denton Brick made aware.  Pt's needs addressed.  Urinal at bedside.

## 2022-12-15 ENCOUNTER — Inpatient Hospital Stay (HOSPITAL_COMMUNITY): Payer: Medicaid Other

## 2022-12-15 DIAGNOSIS — Z9889 Other specified postprocedural states: Secondary | ICD-10-CM

## 2022-12-15 DIAGNOSIS — L02611 Cutaneous abscess of right foot: Secondary | ICD-10-CM | POA: Diagnosis not present

## 2022-12-15 DIAGNOSIS — R7881 Bacteremia: Secondary | ICD-10-CM | POA: Diagnosis not present

## 2022-12-15 DIAGNOSIS — R338 Other retention of urine: Secondary | ICD-10-CM

## 2022-12-15 DIAGNOSIS — N179 Acute kidney failure, unspecified: Secondary | ICD-10-CM | POA: Diagnosis not present

## 2022-12-15 LAB — CBC
HCT: 20.9 % — ABNORMAL LOW (ref 39.0–52.0)
Hemoglobin: 7 g/dL — ABNORMAL LOW (ref 13.0–17.0)
MCH: 29 pg (ref 26.0–34.0)
MCHC: 33.5 g/dL (ref 30.0–36.0)
MCV: 86.7 fL (ref 80.0–100.0)
Platelets: 349 10*3/uL (ref 150–400)
RBC: 2.41 MIL/uL — ABNORMAL LOW (ref 4.22–5.81)
RDW: 13.5 % (ref 11.5–15.5)
WBC: 11.5 10*3/uL — ABNORMAL HIGH (ref 4.0–10.5)
nRBC: 0 % (ref 0.0–0.2)

## 2022-12-15 LAB — BASIC METABOLIC PANEL
Anion gap: 9 (ref 5–15)
Anion gap: 13 (ref 5–15)
Anion gap: 14 (ref 5–15)
BUN: 76 mg/dL — ABNORMAL HIGH (ref 6–20)
BUN: 80 mg/dL — ABNORMAL HIGH (ref 6–20)
BUN: 83 mg/dL — ABNORMAL HIGH (ref 6–20)
CO2: 20 mmol/L — ABNORMAL LOW (ref 22–32)
CO2: 18 mmol/L — ABNORMAL LOW (ref 22–32)
CO2: 17 mmol/L — ABNORMAL LOW (ref 22–32)
Calcium: 7.7 mg/dL — ABNORMAL LOW (ref 8.9–10.3)
Calcium: 7.6 mg/dL — ABNORMAL LOW (ref 8.9–10.3)
Calcium: 7.9 mg/dL — ABNORMAL LOW (ref 8.9–10.3)
Chloride: 98 mmol/L (ref 98–111)
Chloride: 96 mmol/L — ABNORMAL LOW (ref 98–111)
Chloride: 95 mmol/L — ABNORMAL LOW (ref 98–111)
Creatinine, Ser: 5.1 mg/dL — ABNORMAL HIGH (ref 0.61–1.24)
Creatinine, Ser: 5.22 mg/dL — ABNORMAL HIGH (ref 0.61–1.24)
Creatinine, Ser: 5.49 mg/dL — ABNORMAL HIGH (ref 0.61–1.24)
GFR, Estimated: 13 mL/min — ABNORMAL LOW (ref >60)
GFR, Estimated: 13 mL/min — ABNORMAL LOW (ref >60)
GFR, Estimated: 12 mL/min — ABNORMAL LOW (ref >60)
Glucose, Bld: 89 mg/dL (ref 70–99)
Glucose, Bld: 92 mg/dL (ref 70–99)
Glucose, Bld: 75 mg/dL (ref 70–99)
Potassium: 5.9 mmol/L — ABNORMAL HIGH (ref 3.5–5.1)
Potassium: 6.9 mmol/L (ref 3.5–5.1)
Potassium: 5.8 mmol/L — ABNORMAL HIGH (ref 3.5–5.1)
Sodium: 127 mmol/L — ABNORMAL LOW (ref 135–145)
Sodium: 127 mmol/L — ABNORMAL LOW (ref 135–145)
Sodium: 126 mmol/L — ABNORMAL LOW (ref 135–145)

## 2022-12-15 LAB — RENAL FUNCTION PANEL
Albumin: 1.8 g/dL — ABNORMAL LOW (ref 3.5–5.0)
Anion gap: 12 (ref 5–15)
BUN: 76 mg/dL — ABNORMAL HIGH (ref 6–20)
CO2: 19 mmol/L — ABNORMAL LOW (ref 22–32)
Calcium: 7.7 mg/dL — ABNORMAL LOW (ref 8.9–10.3)
Chloride: 97 mmol/L — ABNORMAL LOW (ref 98–111)
Creatinine, Ser: 5.17 mg/dL — ABNORMAL HIGH (ref 0.61–1.24)
GFR, Estimated: 13 mL/min — ABNORMAL LOW (ref >60)
Glucose, Bld: 96 mg/dL (ref 70–99)
Phosphorus: 7.5 mg/dL — ABNORMAL HIGH (ref 2.5–4.6)
Potassium: 5.8 mmol/L — ABNORMAL HIGH (ref 3.5–5.1)
Sodium: 128 mmol/L — ABNORMAL LOW (ref 135–145)

## 2022-12-15 LAB — GLUCOSE, CAPILLARY
Glucose-Capillary: 157 mg/dL — ABNORMAL HIGH (ref 70–99)
Glucose-Capillary: 133 mg/dL — ABNORMAL HIGH (ref 70–99)
Glucose-Capillary: 55 mg/dL — ABNORMAL LOW (ref 70–99)

## 2022-12-15 MED ORDER — DEXTROSE 5 % IV BOLUS
250.0000 mL | Freq: Once | INTRAVENOUS | Status: AC
  Administered 2022-12-15: 250 mL via INTRAVENOUS

## 2022-12-15 MED ORDER — DEXTROSE 50 % IV SOLN
INTRAVENOUS | Status: AC
  Filled 2022-12-15: qty 50

## 2022-12-15 MED ORDER — SODIUM CHLORIDE 0.9 % IV SOLN: INTRAVENOUS | Status: DC

## 2022-12-15 MED ORDER — CALCIUM GLUCONATE-NACL 1-0.675 GM/50ML-% IV SOLN
1.0000 g | Freq: Once | INTRAVENOUS | Status: AC
  Administered 2022-12-15: 1000 mg via INTRAVENOUS
  Filled 2022-12-15: qty 50

## 2022-12-15 MED ORDER — ALBUTEROL SULFATE HFA 108 (90 BASE) MCG/ACT IN AERS
2.0000 | INHALATION_SPRAY | RESPIRATORY_TRACT | Status: DC
  Filled 2022-12-15: qty 6.7

## 2022-12-15 MED ORDER — SODIUM ZIRCONIUM CYCLOSILICATE 10 G PO PACK
10.0000 g | PACK | Freq: Three times a day (TID) | ORAL | Status: DC
  Administered 2022-12-15 – 2022-12-17 (×7): 10 g via ORAL
  Filled 2022-12-15 (×8): qty 1

## 2022-12-15 MED ORDER — SODIUM CHLORIDE 0.9 % IV SOLN
8.0000 mg/kg | INTRAVENOUS | Status: DC
  Administered 2022-12-17 – 2022-12-22 (×4): 850 mg via INTRAVENOUS
  Filled 2022-12-15 (×4): qty 17

## 2022-12-15 NOTE — Progress Notes (Signed)
PROGRESS NOTE  Hunter Reyes    DOB: 10-26-1976, 46 y.o.  SEG:[id number]    Code Status: Full Code   DOA: 12/02/2022   LOS: 12   Brief hospital course  Hunter Reyes is a 46 y.o. male with a PMH significant for bipolar 1 disorder, borderline personality disorder, COPD, crack cocaine abuse, depression, peripheral neuropathy, history of right foot osteomyelitis, panic attack and history of seizures.  They presented from home to the ED on 12/02/2022 with AMS associated with low back pain and urinary retention x 3 days. This was following several days of dysuria.   In the ED, it was found that they had normal vital signs.  He had acute urinary retention and AKI. Also had MRSA bacteremia with right foot abscess requiring TMA, lumbar discitis/abscess- requiring emergent laminectomy.   Significant events: 12/10>> admit to [facility] at AP-AKI-urinary retention.  Plans to transfer to [facility]. 12/13>> laminectomy/decompression of ventral lumbar epidural abscess 12/17>> transmetatarsal amputation of right foot   Significant studies: 12/11>> CT renal stone study: Possible discitis L2-L3/L3-L4, moderate bilateral hydronephrosis/hydroureter 12/13>> MRI L-spine: Discitis/osteomyelitis at L3-L4 with 9 mm thick ventral canal phlegmon/abscess which extends from L3 to L4-L5.  Multiple paraspinal abscesses within the psoas musculature. 12/13>> echo: EF 60-65%, no evidence of valvular vegetations. 12/14>> MRI left foot: No osteomyelitis/abscess 12/15>> MRI right foot: Abscess with osteomyelitis of second/third metatarsals.   Significant microbiology data: 12/10>> COVID/influenza PCR: Negative 12/11>> blood culture: MRSA 12/13>> blood culture: MRSA 12/13>> epidural abscess culture: MRSA 12/14>> blood culture: No growth   Procedures: 12/13>> right L4 laminectomy-sublaminar decompression of ventral epidural abscess. 12/17>> transmetatarsal amputation of right foot  12/15/22 -stable  Assessment & Plan   Principal Problem:   MRSA bacteremia Active Problems:   AKI (acute kidney injury) ([place])   Diskitis   Bipolar 1 disorder (HCC)   Peripheral neuropathy   Polysubstance abuse (HCC)   Metabolic acidosis   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 (HCC)   Cutaneous abscess of right foot  obstructive uropathy/Urinary retention- failed voiding trial. Foley placed again. Repeat renal US showed moderate hydronephrosis on right and mild to moderate on left.  - continue foley   AKI-   Cr increased to 5.17 today. Hyponatremia hyperkalemia  creatinine on admission=20.99 (12/03/22)  ,  baseline creatinine = previously normal renal function 5 years ago no recent renal function available - continue flomax - urology consult outpatient - electrolyte monitoring and repletion PRN - IV fluids - ECG    MRSA bacteremia L3-L4 discitis with epidural abscess-s/p laminectomy/decompression on 12/13 Right foot osteomyelitis/abscess-s/p transmetatarsal amputation on 12/17 Overall improved-repeat cultures [facility] Orthopedics/neurosurgery following-able to lift both lower extremities off the bed. Continue to work with PT/OT ID recommending 8 weeks of IV Daptomycin-end date 02/03/2023, repeat MRI L spine on 01/02/23- and to reconsult ID around 01/02/2023 to revisit  antibiotic plan following repeat imaging Given history of drug use-unfortunately will remain inpatient to complete IV antibiotic duration.  Not a candidate for home therapies. -SNF facilities will not take patient due to drug use   acute anemia--multifactorial etiology  -Hgb was 13 on admission on 12/03/2022 -Hgb drifting down -Transfuse if hemodynamically unstable or Hgb below 7 -no Bleeding concerns at this time   Very poor dentition/dental caries noted--- outpatient follow-up with dentist advised   Peripheral neuropathy Continue Neurontin   Bipolar 1 disorder Stable Continue Abilify   Polysubstance abuse History of cocaine  use Remains on Suboxone -UDS was actually negative on admission on 12/03/2022  Chronic hepatitis B infection defer further to infectious disease   Morbid Obesity: Estimated body mass index is 42.18 kg/m as calculated from the following:   Height as of this encounter: 6\' 2"  (1.88 m).   Weight as of this encounter: 149 kg.   Body mass index is 42.18 kg/m.  VTE ppx: SCD's Start: 12/09/22 1137  Diet:     Diet   Diet Carb Modified Fluid consistency: Thin; Room service appropriate? Yes   Consultants: ID Neurosurgery  Orthopedic surgery  Subjective 12/15/22    Pt reports feeling alright today. He has no complaints.    Objective   Vitals:   12/14/22 0424 12/14/22 1533 12/14/22 2344 12/15/22 0547  BP: (!) 100/55 (!) 96/48 (!) 107/57 (!) 108/50  Pulse: 98 88 86 90  Resp:  16 16 18   Temp: 98.9 F (37.2 C) 98.9 F (37.2 C) 98.6 F (37 C) 98.9 F (37.2 C)  TempSrc: Oral Oral Oral Oral  SpO2: 93% 94% 94% 95%  Weight:      Height:        Intake/Output Summary (Last 24 hours) at 12/15/2022 0806 Last data filed at 12/15/2022 0500 Gross per 24 hour  Intake 1480 ml  Output 2175 ml  Net -695 ml   Filed Weights   12/02/22 2230  Weight: (!) 149 kg     Physical Exam:  General: awake, alert, NAD HEENT: atraumatic, clear conjunctiva, anicteric sclera, MMM, hearing grossly normal Respiratory: normal respiratory effort. Cardiovascular: quick capillary refill  Gastrointestinal: soft, NT, ND Nervous: A&O x3. no gross focal neurologic deficits, normal speech Extremities: moves all equally, no edema, normal tone. R foot wound in clean/dry dressing Skin: dry, intact, normal temperature, normal color. No rashes, lesions or ulcers on exposed skin Psychiatry: normal mood, congruent affect  Labs   I have personally reviewed the following labs and imaging studies CBC    Component Value Date/Time   WBC 11.5 (H) 12/15/2022 0509   RBC 2.41 (L) 12/15/2022 0509   HGB 7.0 (L)  12/15/2022 0509   HCT 20.9 (L) 12/15/2022 0509   PLT 349 12/15/2022 0509   MCV 86.7 12/15/2022 0509   MCH 29.0 12/15/2022 0509   MCHC 33.5 12/15/2022 0509   RDW 13.5 12/15/2022 0509   LYMPHSABS 1.0 12/03/2022 0007   MONOABS 4.2 (H) 12/03/2022 0007   EOSABS 0.0 12/03/2022 0007   BASOSABS 0.0 12/03/2022 0007      Latest Ref Rng & Units 12/15/2022    5:09 AM 12/14/2022    4:18 AM 12/13/2022    4:31 AM  BMP  Glucose 70 - 99 mg/dL 96  103  94   BUN 6 - 20 mg/dL 76  51  39   Creatinine 0.61 - 1.24 mg/dL 5.17  3.46  1.91   Sodium 135 - 145 mmol/L 128  133  137   Potassium 3.5 - 5.1 mmol/L 5.8  5.1  4.2   Chloride 98 - 111 mmol/L 97  101  106   CO2 22 - 32 mmol/L 19  22  23    Calcium 8.9 - 10.3 mg/dL 7.7  8.3  7.9    Disposition Plan & Communication  Patient status: Inpatient  Admitted From: Home Planned disposition location: Home Anticipated discharge date: 2/12 pending completion of IV Abx  Family Communication: none     Author: Richarda Osmond, DO [facility] 12/15/2022, 8:06 AM   Available by Epic secure chat 7AM-7PM. If 7PM-7AM, please contact night-coverage.  [facility] contact information  found on [email or web address].

## 2022-12-15 NOTE — Progress Notes (Signed)
Pharmacy Antibiotic Note  Hunter Reyes is a 46 y.o. male admitted on 12/02/2022 with disseminated MRSA bacteremia and epidural abscess with discitis/osteomyelitis. SCr 5.17, not at baseline, Scr was improving, however, now trending up again. CrCl 28 ml/min. WBC 11.5 and afebrile. CK 84, repeat CK weekly on Thursdays. Pharmacy has been consulted to dose Daptomycin. Repeat blood culture on 12/14 no growth. ID team following and planning for 8 weeks of treatment (end date 02/03/2023).  Plan: - Decrease Daptomycin 850 mg (8 mg/kg AdjBW) every 48 hours - Monitor CK weekly on Thurs next due 12/28.  - Monitor Scr tomorrow morning - Will continue to follow renal function, culture results, LOT, and antibiotic de-escalation plans   Height: 6\' 2"  (188 cm) Weight: (!) 149 kg (328 lb 7.8 oz) IBW/kg (Calculated) : 82.2  Temp (24hrs), Avg:98.8 F (37.1 C), Min:98.6 F (37 C), Max:98.9 F (37.2 C)  Recent Labs  Lab 12/09/22 0201 12/09/22 0211 12/10/22 0453 12/11/22 0453 12/12/22 0321 12/13/22 0431 12/14/22 0418 12/15/22 0509  WBC 20.8*  --  15.2* 13.8*  --   --  8.9 11.5*  CREATININE  --    < > 1.60* 1.83* 1.74* 1.91* 3.46* 5.17*   < > = values in this interval not displayed.     Estimated Creatinine Clearance: 27.8 mL/min (A) (by C-G formula based on SCr of 5.17 mg/dL (H)).    Allergies  Allergen Reactions   Darvocet [Propoxyphene N-Acetaminophen] Hives and Itching   Tramadol Hives and Itching   Other Hives    Antimicrobials this admission: Vancomycin 2g 12/11 x 1 dose Cefepime 12/11 >> 12/12 Flagyl 12/11 >> 12/12 Daptomycin >> 12/13  Weekly CK levels:  12/14 CK - 201 12/20 CK- 90 12/21 CK - 84  Dose adjustments this admission: VR 7 mcg/ml on 12/13 AM  Microbiology results: 12/3 UCx with MRSA and staph epi 12/11 BCX: MRSA 12/11 UCX: MRSA 12/13 BCx: staph aureus 12/13 Abscess: staph aureus 12/14: BCx:  no growth 5 days  Jeneen Rinks, Pharm.D PGY1 Pharmacy  Resident 12/15/2022 8:42 AM   **Pharmacist phone directory can be found on [email or web address] listed under [place]**

## 2022-12-15 NOTE — Plan of Care (Signed)
  Problem: Fluid Volume: Goal: Hemodynamic stability will improve Outcome: Progressing   Problem: Clinical Measurements: Goal: Diagnostic test results will improve Outcome: Progressing Goal: Signs and symptoms of infection will decrease Outcome: Progressing   Problem: Respiratory: Goal: Ability to maintain adequate ventilation will improve Outcome: Progressing   Problem: Education: Goal: Knowledge of General Education information will improve Description: Including pain rating scale, medication(s)/side effects and non-pharmacologic comfort measures Outcome: Progressing   Problem: Health Behavior/Discharge Planning: Goal: Ability to manage health-related needs will improve Outcome: Progressing   Problem: Clinical Measurements: Goal: Ability to maintain clinical measurements within normal limits will improve Outcome: Progressing Goal: Will remain free from infection Outcome: Progressing Goal: Diagnostic test results will improve Outcome: Progressing Goal: Respiratory complications will improve Outcome: Progressing Goal: Cardiovascular complication will be avoided Outcome: Progressing   Problem: Activity: Goal: Risk for activity intolerance will decrease Outcome: Progressing   Problem: Nutrition: Goal: Adequate nutrition will be maintained Outcome: Progressing   Problem: Coping: Goal: Level of anxiety will decrease Outcome: Progressing   Problem: Elimination: Goal: Will not experience complications related to bowel motility Outcome: Progressing Goal: Will not experience complications related to urinary retention Outcome: Progressing   Problem: Pain Managment: Goal: General experience of comfort will improve Outcome: Progressing   Problem: Safety: Goal: Ability to remain free from injury will improve Outcome: Progressing   Problem: Skin Integrity: Goal: Risk for impaired skin integrity will decrease Outcome: Progressing   Problem: Education: Goal:  Knowledge of the prescribed therapeutic regimen will improve Outcome: Progressing Goal: Ability to verbalize activity precautions or restrictions will improve Outcome: Progressing Goal: Understanding of discharge needs will improve Outcome: Progressing   Problem: Activity: Goal: Ability to perform//tolerate increased activity and mobilize with assistive devices will improve Outcome: Progressing   Problem: Clinical Measurements: Goal: Postoperative complications will be avoided or minimized Outcome: Progressing   Problem: Self-Care: Goal: Ability to meet self-care needs will improve Outcome: Progressing   Problem: Self-Concept: Goal: Ability to maintain and perform role responsibilities to the fullest extent possible will improve Outcome: Progressing   Problem: Pain Management: Goal: Pain level will decrease with appropriate interventions Outcome: Progressing   Problem: Skin Integrity: Goal: Demonstration of wound healing without infection will improve Outcome: Progressing

## 2022-12-15 NOTE — Progress Notes (Addendum)
Date and time results received: 12/15/22 1821 (use smartphrase ".now" to insert current time)  Test: K+ Critical Value: 6.9  Name of Provider Notified: Doristine Mango, MD  Orders Received? Or Actions Taken?: See new orders.

## 2022-12-16 DIAGNOSIS — L02611 Cutaneous abscess of right foot: Secondary | ICD-10-CM | POA: Diagnosis not present

## 2022-12-16 DIAGNOSIS — E875 Hyperkalemia: Secondary | ICD-10-CM

## 2022-12-16 DIAGNOSIS — M4647 Discitis, unspecified, lumbosacral region: Secondary | ICD-10-CM | POA: Diagnosis not present

## 2022-12-16 DIAGNOSIS — N179 Acute kidney failure, unspecified: Secondary | ICD-10-CM | POA: Diagnosis not present

## 2022-12-16 DIAGNOSIS — R7881 Bacteremia: Secondary | ICD-10-CM | POA: Diagnosis not present

## 2022-12-16 LAB — BASIC METABOLIC PANEL
Anion gap: 12 (ref 5–15)
Anion gap: 11 (ref 5–15)
Anion gap: 14 (ref 5–15)
Anion gap: 14 (ref 5–15)
BUN: 87 mg/dL — ABNORMAL HIGH (ref 6–20)
BUN: 89 mg/dL — ABNORMAL HIGH (ref 6–20)
BUN: 90 mg/dL — ABNORMAL HIGH (ref 6–20)
BUN: 84 mg/dL — ABNORMAL HIGH (ref 6–20)
CO2: 17 mmol/L — ABNORMAL LOW (ref 22–32)
CO2: 18 mmol/L — ABNORMAL LOW (ref 22–32)
CO2: 19 mmol/L — ABNORMAL LOW (ref 22–32)
CO2: 21 mmol/L — ABNORMAL LOW (ref 22–32)
Calcium: 7.8 mg/dL — ABNORMAL LOW (ref 8.9–10.3)
Calcium: 7.5 mg/dL — ABNORMAL LOW (ref 8.9–10.3)
Calcium: 7.7 mg/dL — ABNORMAL LOW (ref 8.9–10.3)
Calcium: 7.5 mg/dL — ABNORMAL LOW (ref 8.9–10.3)
Chloride: 99 mmol/L (ref 98–111)
Chloride: 97 mmol/L — ABNORMAL LOW (ref 98–111)
Chloride: 96 mmol/L — ABNORMAL LOW (ref 98–111)
Chloride: 97 mmol/L — ABNORMAL LOW (ref 98–111)
Creatinine, Ser: 6.01 mg/dL — ABNORMAL HIGH (ref 0.61–1.24)
Creatinine, Ser: 6 mg/dL — ABNORMAL HIGH (ref 0.61–1.24)
Creatinine, Ser: 6.18 mg/dL — ABNORMAL HIGH (ref 0.61–1.24)
Creatinine, Ser: 6.48 mg/dL — ABNORMAL HIGH (ref 0.61–1.24)
GFR, Estimated: 11 mL/min — ABNORMAL LOW (ref >60)
GFR, Estimated: 11 mL/min — ABNORMAL LOW (ref >60)
GFR, Estimated: 11 mL/min — ABNORMAL LOW (ref >60)
GFR, Estimated: 10 mL/min — ABNORMAL LOW (ref >60)
Glucose, Bld: 97 mg/dL (ref 70–99)
Glucose, Bld: 93 mg/dL (ref 70–99)
Glucose, Bld: 90 mg/dL (ref 70–99)
Glucose, Bld: 101 mg/dL — ABNORMAL HIGH (ref 70–99)
Potassium: 6.7 mmol/L (ref 3.5–5.1)
Potassium: 6.6 mmol/L (ref 3.5–5.1)
Potassium: 6.3 mmol/L (ref 3.5–5.1)
Potassium: 5.8 mmol/L — ABNORMAL HIGH (ref 3.5–5.1)
Sodium: 128 mmol/L — ABNORMAL LOW (ref 135–145)
Sodium: 126 mmol/L — ABNORMAL LOW (ref 135–145)
Sodium: 129 mmol/L — ABNORMAL LOW (ref 135–145)
Sodium: 132 mmol/L — ABNORMAL LOW (ref 135–145)

## 2022-12-16 LAB — PREPARE RBC (CROSSMATCH)

## 2022-12-16 LAB — RENAL FUNCTION PANEL
Albumin: 1.8 g/dL — ABNORMAL LOW (ref 3.5–5.0)
Anion gap: 16 — ABNORMAL HIGH (ref 5–15)
BUN: 83 mg/dL — ABNORMAL HIGH (ref 6–20)
CO2: 16 mmol/L — ABNORMAL LOW (ref 22–32)
Calcium: 7.9 mg/dL — ABNORMAL LOW (ref 8.9–10.3)
Chloride: 95 mmol/L — ABNORMAL LOW (ref 98–111)
Creatinine, Ser: 5.86 mg/dL — ABNORMAL HIGH (ref 0.61–1.24)
GFR, Estimated: 11 mL/min — ABNORMAL LOW (ref >60)
Glucose, Bld: 91 mg/dL (ref 70–99)
Phosphorus: 7.3 mg/dL — ABNORMAL HIGH (ref 2.5–4.6)
Potassium: 7 mmol/L (ref 3.5–5.1)
Sodium: 127 mmol/L — ABNORMAL LOW (ref 135–145)

## 2022-12-16 LAB — POTASSIUM: Potassium: 6.5 mmol/L (ref 3.5–5.1)

## 2022-12-16 LAB — CBC
HCT: 19 % — ABNORMAL LOW (ref 39.0–52.0)
Hemoglobin: 6.5 g/dL — CL (ref 13.0–17.0)
MCH: 29.3 pg (ref 26.0–34.0)
MCHC: 34.2 g/dL (ref 30.0–36.0)
MCV: 85.6 fL (ref 80.0–100.0)
Platelets: 301 10*3/uL (ref 150–400)
RBC: 2.22 MIL/uL — ABNORMAL LOW (ref 4.22–5.81)
RDW: 13.3 % (ref 11.5–15.5)
WBC: 8.2 10*3/uL (ref 4.0–10.5)
nRBC: 0 % (ref 0.0–0.2)

## 2022-12-16 MED ORDER — SODIUM CHLORIDE 0.9 % IV BOLUS
1000.0000 mL | Freq: Once | INTRAVENOUS | Status: AC
  Administered 2022-12-16: 1000 mL via INTRAVENOUS

## 2022-12-16 MED ORDER — STERILE WATER FOR INJECTION IV SOLN
INTRAVENOUS | Status: AC
  Filled 2022-12-16: qty 150
  Filled 2022-12-16: qty 1000
  Filled 2022-12-16: qty 150

## 2022-12-16 MED ORDER — CALCIUM GLUCONATE-NACL 1-0.675 GM/50ML-% IV SOLN
1.0000 g | Freq: Once | INTRAVENOUS | Status: AC
  Administered 2022-12-16: 1000 mg via INTRAVENOUS
  Filled 2022-12-16: qty 50

## 2022-12-16 MED ORDER — ALBUTEROL SULFATE (2.5 MG/3ML) 0.083% IN NEBU
10.0000 mg/h | INHALATION_SOLUTION | RESPIRATORY_TRACT | Status: AC
  Administered 2022-12-16: 10 mg/h via RESPIRATORY_TRACT
  Filled 2022-12-16: qty 12

## 2022-12-16 MED ORDER — SODIUM BICARBONATE 8.4 % IV SOLN
50.0000 meq | Freq: Once | INTRAVENOUS | Status: AC
  Administered 2022-12-16: 50 meq via INTRAVENOUS
  Filled 2022-12-16: qty 50

## 2022-12-16 MED ORDER — DEXTROSE 50 % IV SOLN
1.0000 | Freq: Once | INTRAVENOUS | Status: AC
  Administered 2022-12-16: 50 mL via INTRAVENOUS
  Filled 2022-12-16: qty 50

## 2022-12-16 MED ORDER — ALBUTEROL SULFATE (2.5 MG/3ML) 0.083% IN NEBU: 2.5000 mg | INHALATION_SOLUTION | RESPIRATORY_TRACT | Status: DC | PRN

## 2022-12-16 MED ORDER — CHLORHEXIDINE GLUCONATE CLOTH 2 % EX PADS
6.0000 | MEDICATED_PAD | Freq: Every day | CUTANEOUS | Status: DC
  Administered 2022-12-16 – 2022-12-17 (×2): 6 via TOPICAL

## 2022-12-16 MED ORDER — ALBUTEROL SULFATE (2.5 MG/3ML) 0.083% IN NEBU
10.0000 mg/h | INHALATION_SOLUTION | RESPIRATORY_TRACT | Status: DC
  Administered 2022-12-16: 10 mg/h via RESPIRATORY_TRACT
  Filled 2022-12-16: qty 12

## 2022-12-16 MED ORDER — ALBUTEROL SULFATE (2.5 MG/3ML) 0.083% IN NEBU
2.5000 mg | INHALATION_SOLUTION | RESPIRATORY_TRACT | Status: AC
  Administered 2022-12-16 – 2022-12-18 (×9): 2.5 mg via RESPIRATORY_TRACT
  Filled 2022-12-16 (×9): qty 3

## 2022-12-16 MED ORDER — ALBUTEROL SULFATE (2.5 MG/3ML) 0.083% IN NEBU: 2.5000 mg | INHALATION_SOLUTION | RESPIRATORY_TRACT | Status: DC

## 2022-12-16 MED ORDER — SODIUM CHLORIDE 0.9% IV SOLUTION: Freq: Once | INTRAVENOUS | Status: AC

## 2022-12-16 MED ORDER — INSULIN ASPART 100 UNIT/ML IJ SOLN
10.0000 [IU] | Freq: Once | INTRAMUSCULAR | Status: AC
  Administered 2022-12-16: 10 [IU] via INTRAVENOUS

## 2022-12-16 MED ORDER — ALBUTEROL SULFATE (2.5 MG/3ML) 0.083% IN NEBU
10.0000 mg | INHALATION_SOLUTION | Freq: Once | RESPIRATORY_TRACT | Status: AC
  Administered 2022-12-16: 2.5 mg via RESPIRATORY_TRACT
  Filled 2022-12-16: qty 12

## 2022-12-16 MED ORDER — HEPARIN SODIUM (PORCINE) 5000 UNIT/ML IJ SOLN
5000.0000 [IU] | Freq: Three times a day (TID) | INTRAMUSCULAR | Status: DC
  Administered 2022-12-16 – 2023-01-15 (×89): 5000 [IU] via SUBCUTANEOUS
  Filled 2022-12-16 (×94): qty 1

## 2022-12-16 NOTE — Progress Notes (Signed)
Went in room due to patient desatting into the low 80's.  Patient was asleep and mouth breathing.  As soon as I woke pt up he went back to the low 90's on room air.

## 2022-12-16 NOTE — Progress Notes (Signed)
   12/16/22 1121  Assess: MEWS Score  Temp 97.7 F (36.5 C)  BP (!) 93/44  MAP (mmHg) (!) 59  Pulse Rate (!) 107  ECG Heart Rate (!) 112  Resp 20  Level of Consciousness Alert  SpO2 95 %  O2 Device Room Air  Patient Activity (if Appropriate) In bed  Assess: MEWS Score  MEWS Temp 0  MEWS Systolic 1  MEWS Pulse 2  MEWS RR 0  MEWS LOC 0  MEWS Score 3  MEWS Score Color Yellow  Assess: if the MEWS score is Yellow or Red  Were vital signs taken at a resting state? Yes  Focused Assessment No change from prior assessment  Does the patient meet 2 or more of the SIRS criteria? Yes  Does the patient have a confirmed or suspected source of infection? Yes  Provider and Rapid Response Notified? Yes  MEWS guidelines implemented *See Row Information* Yes  Treat  Pain Scale 0-10  Pain Score 0  Take Vital Signs  Increase Vital Sign Frequency  Red: Q 1hr X 4 then Q 4hr X 4, if remains red, continue Q 4hrs  Escalate  MEWS: Escalate Red: discuss with charge nurse/RN and provider, consider discussing with RRT  Notify: Charge Nurse/RN  Name of Charge Nurse/RN Notified Mollie Germany RN  Date Charge Nurse/RN Notified 12/16/22  Time Charge Nurse/RN Notified [phone]  Provider Notification  Provider Name/Title Shela Leff MD  Date Provider Notified 12/16/22  Time Provider Notified [phone]  Method of Notification Call  Notification Reason Critical Result  Provider response No new orders (notified to call rapid response as provider in code)  Date of Provider Response 12/16/22  Time of Provider Response [phone]  Notify: Rapid Response  Name of Rapid Response RN Notified Kathreen Devoid RN  Date Rapid Response Notified 12/16/22  Time Rapid Response Notified [phone]  Document  Patient Outcome Stabilized after interventions (await labs post medication administration for K=7.0)  Assess: SIRS CRITERIA  SIRS Temperature  0  SIRS Pulse 1  SIRS Respirations  0  SIRS WBC 0  SIRS Score Sum  1

## 2022-12-16 NOTE — Progress Notes (Signed)
[facility] Progress Note   Assessment/ Plan:   1. AKI: recurrent, d/t obstructive uropathy. Good UOP.  - appears dry, will do saline bolus and bicarb gtt  - would not remove Foley for another TOV at this time  2.  Hyperkalemia:  - likely d/t obstructive uropathy  - changed to renal diet  - got albuterol this AM  - start bicarb gtt  - lokelma TID  - s/p calcium gluconate  - serial BMP checks  - hoping with relief of obstruction and aggressive management will not need hD  3.  MRSA bacteremia  - on daptomycin  4.  Dispo: admitted  Subjective:    Seen as a re-consult.  Had obstructive uropathy and had Foley placed.  This was removed a couple days ago and had more urinary retention.  Replaced yesterday per RN staff.  Cr 1.9--> 3.46--> 5.1-->6.0 this AM.  K 7.0--> 6.5-->6.7.  Is having UOP, nearly 1L since shift change.  Pressures are soft.  Has several high K foods at bedside (bananas, orange juice).  Has lokelma on board.  In this setting we are asked to see.   Objective:   BP (!) 102/52 (BP Location: Left Arm)   Pulse 99   Temp 99.3 F (37.4 C) (Oral)   Resp 18   Ht 6\' 2"  (1.88 m)   Wt (!) 149 kg   SpO2 (!) 89%   BMI 42.18 kg/m   Intake/Output Summary (Last 24 hours) at 12/16/2022 1259 Last data filed at 12/16/2022 4403 Gross per 24 hour  Intake 2715.93 ml  Output 1600 ml  Net 1115.93 ml   Weight change:   Physical Exam: KVQ:[id number] chronically ill HEENT: dry MM CVS: tachycardic Resp: clear Abd: soft Ext: 1+ trace edema  Imaging: US RENAL  Result Date: 12/15/2022 CLINICAL DATA:  Retention of urine EXAM: RENAL / URINARY TRACT ULTRASOUND COMPLETE COMPARISON:  None Available. FINDINGS: Right Kidney: Renal measurements: 12.3 x 6.4 x 7.3 cm = volume: 301 mL. Moderate hydronephrosis. Left Kidney: Renal measurements: 13.4 x 6.1 x 6.6 cm = volume: 283 mL. Mild to moderate hydronephrosis. Bladder: The bladder is decompressed with a Foley catheter  precluding evaluation. Other: None. IMPRESSION: 1. Moderate hydronephrosis on the right and mild to moderate hydronephrosis on the left. 2. The bladder is decompressed with a Foley catheter precluding evaluation. Electronically Signed   By: Dorise Bullion III M.D.   On: 12/15/2022 11:12    Labs: BMET Recent Labs  Lab 12/10/22 0453 12/11/22 0453 12/12/22 0321 12/13/22 0431 12/14/22 0418 12/15/22 0505 12/15/22 0509 12/15/22 1627 12/15/22 2038 12/16/22 0337 12/16/22 0640 12/16/22 1017  NA 138 136 140 137 133* 127* 128* 127* 126* 127*  --  128*  K 3.7 4.2 4.1 4.2 5.1 5.9* 5.8* 6.9* 5.8* 7.0* 6.5* 6.7*  CL 106 104 107 106 101 98 97* 96* 95* 95*  --  99  CO2 23 24 23 23 22  20* 19* 18* 17* 16*  --  17*  GLUCOSE 104* 93 96 94 103* 89 96 92 75 91  --  97  BUN 54* 53* 45* 39* 51* 76* 76* 80* 83* 83*  --  87*  CREATININE 1.60* 1.83* 1.74* 1.91* 3.46* 5.10* 5.17* 5.22* 5.49* 5.86*  --  6.01*  CALCIUM 7.9* 8.2* 7.9* 7.9* 8.3* 7.7* 7.7* 7.6* 7.9* 7.9*  --  7.8*  PHOS 5.0* 4.3 5.1* 5.6* 5.9*  --  7.5*  --   --  7.3*  --   --  CBC Recent Labs  Lab 12/10/22 0453 12/11/22 0453 12/14/22 0418 12/15/22 0509  WBC 15.2* 13.8* 8.9 11.5*  HGB 8.6* 8.2* 7.7* 7.0*  HCT 25.7* 24.5* 23.3* 20.9*  MCV 87.4 87.5 87.6 86.7  PLT 280 304 345 349    Medications:     buprenorphine-naloxone  1 tablet Sublingual BID   Chlorhexidine Gluconate Cloth  6 each Topical Q0600   heparin injection (subcutaneous)  5,000 Units Subcutaneous Q8H   LORazepam  1 mg Intravenous Once   nutrition supplement (JUVEN)  1 packet Oral BID BM   pantoprazole  40 mg Oral Daily   polyethylene glycol  17 g Oral Daily   sertraline  25 mg Oral Daily   sodium zirconium cyclosilicate  10 g Oral TID   tamsulosin  0.4 mg Oral QPC supper   thiamine  500 mg Oral Daily    Madelon Lips MD 12/16/2022, 12:59 PM

## 2022-12-16 NOTE — Progress Notes (Signed)
Paged about critical potassium.  Patient is on Lokelma and labs this morning showing potassium 7.0.  I have ordered stat EKG, cardiac monitoring, calcium gluconate, insulin/dextrose, sodium bicarb, and albuterol.  Repeat labs and continue treatment as needed.

## 2022-12-16 NOTE — Progress Notes (Signed)
PROGRESS NOTE  Hunter Reyes    DOB: Apr 10, 1976, 46 y.o.  JTT:[id number]    Code Status: Full Code   DOA: 12/02/2022   LOS: 53   Brief hospital course  Hunter Reyes is a 46 y.o. male with a PMH significant for bipolar 1 disorder, borderline personality disorder, COPD, crack cocaine abuse, depression, peripheral neuropathy, history of right foot osteomyelitis, panic attack and history of seizures.  They presented from home to the ED on 12/02/2022 with AMS associated with low back pain and urinary retention x 3 days. This was following several days of dysuria.   In the ED, it was found that they had normal vital signs. He had acute urinary retention and AKI. Also had MRSA bacteremia with right foot abscess requiring TMA, and lumbar discitis/abscess- requiring emergent laminectomy.   Significant events: 12/10>> admit to [facility] at [facility]-AKI-urinary retention.  Plans to transfer to [facility]. 12/13>> laminectomy/decompression of ventral lumbar epidural abscess 12/17>> transmetatarsal amputation of right foot 12/21>> failed voiding trial and had worsening kidney function 12/23>> sedated likely in setting of gabapentin toxicity with poor renal function and resistant hyperkalemia. Nephrology re-consulted.    Significant studies: 12/11>> CT renal stone study: Possible discitis L2-L3/L3-L4, moderate bilateral hydronephrosis/hydroureter 12/13>> MRI L-spine: Discitis/osteomyelitis at L3-L4 with 9 mm thick ventral canal phlegmon/abscess which extends from L3 to L4-L5.  Multiple paraspinal abscesses within the psoas musculature. 12/13>> echo: EF 60-65%, no evidence of valvular vegetations. 12/14>> MRI left foot: No osteomyelitis/abscess 12/15>> MRI right foot: Abscess with osteomyelitis of second/third metatarsals.   Significant microbiology data: 12/10>> COVID/influenza PCR: Negative 12/11>> blood culture: MRSA 12/13>> blood culture: MRSA 12/13>> epidural abscess culture: MRSA 12/14>> blood culture:  No growth   Procedures: 12/13>> right L4 laminectomy-sublaminar decompression of ventral epidural abscess. 12/17>> transmetatarsal amputation of right foot  12/16/22 -stable  Assessment & Plan  Principal Problem:   MRSA bacteremia Active Problems:   AKI (acute kidney injury) ([place])   Diskitis   Bipolar 1 disorder (HCC)   Peripheral neuropathy   Polysubstance abuse (HCC)   Metabolic acidosis   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 (HCC)   Cutaneous abscess of right foot   H/O laminectomy   Acute urinary retention   Acute renal failure ([place])  obstructive uropathy/Urinary retention- failed voiding trial. Foley placed again 12/21. Repeat renal US 12/23 showed moderate hydronephrosis on right and mild to moderate on left. Dark urine in drainage bag. >2L UOP yesterday - continue foley, will remain in until urology f/u - continue flomax   AKI  Hyponatremia  hyperkalemia-   Cr remains elevated >5 despite foley being replaced at least 48 hours again now. creatinine on admission=20.99 (12/03/22)  ,  baseline creatinine = previously normal renal function 5 years ago no recent renal function available K+ remains elevated despite treatment. 6.9>5.8>7.0>6.5>6.7. peaked T-waves on ECG. MAPs in 60s when cuff placed properly.  - re-consulted nephrology to evaluate for urgent dialysis.  - BMP q4 hours.  - continue lokelma 10mg  TID - continue PRN albuterol and insulin/dextrose for hyperkalemia shift - continue IV fluids - ECGs PRN and continuous cardiac telemetry.   - s/p 2 doses of calcium gluconate total. - stop gabapentin as patient appears to have intoxication at new renal function level - switched to subq heparin dvt ppx     MRSA bacteremia- Overall improved-repeat cultures NGTD ID recommending 8 weeks of IV Daptomycin-end date 02/03/2023, repeat MRI L spine on 01/02/23- and to reconsult ID around 01/02/2023 to revisit  antibiotic  plan following repeat imaging Given history of  drug use-unfortunately will remain inpatient to complete IV antibiotic duration.  Not a candidate for home therapies. -SNF facilities will not take patient due to drug use  L3-L4 discitis with epidural abscess-s/p laminectomy/decompression on 12/13 - neurosurgery following, appreciate recs - analgesia PRN  Right foot osteomyelitis/abscess-s/p transmetatarsal amputation on 12/17. Wound vac in place with serosangenous drainage.  - Orthopedics following, appreciate recs - Continue to work with PT/OT   acute anemia--multifactorial etiology  -Hgb was 13 on admission on 12/03/2022 -Hgb drifting down -Transfuse if hemodynamically unstable or Hgb below 7 -no Bleeding concerns at this time   Very poor dentition/dental caries noted--- outpatient follow-up with dentist advised   Peripheral neuropathy Continue Neurontin   Bipolar 1 disorder Stable Continue Abilify   Polysubstance abuse- UDS negative on admission on 12/03/2022 History of cocaine use - continue Suboxone, renal adjust as needed   Chronic hepatitis B infection defer further to infectious disease   Morbid Obesity: Estimated body mass index is 42.18 kg/m as calculated from the following:   Height as of this encounter: 6\' 2"  (1.88 m).   Weight as of this encounter: 149 kg.   Body mass index is 42.18 kg/m.  VTE ppx: heparin injection 5,000 Units Start: 12/16/22 1000 SCD's Start: 12/09/22 1137  Diet:     Diet   Diet renal with fluid restriction Fluid restriction: 2000 mL Fluid; Room service appropriate? Yes; Fluid consistency: Thin   Consultants: ID Neurosurgery  Orthopedic surgery nephrology  Subjective 12/16/22    Pt reports feeling unwell but unable to specify. He denies pain but appears to be in pain intermittently grimacing. Denies chest pain, SOB, palpitations. Denies dysuria.    Objective   Vitals:   12/16/22 0852 12/16/22 0853 12/16/22 0854 12/16/22 0855  BP:      Pulse: 95 98 (!) 103 97  Resp:       Temp:      TempSrc:      SpO2: (!) 82% (!) 83% 93% 92%  Weight:      Height:        Intake/Output Summary (Last 24 hours) at 12/16/2022 1151 Last data filed at 12/16/2022 1761 Gross per 24 hour  Intake 2715.93 ml  Output 1600 ml  Net 1115.93 ml   Filed Weights   12/02/22 2230  Weight: (!) 149 kg     Physical Exam:  General: awake, alert, NAD HEENT: atraumatic, clear conjunctiva, anicteric sclera, MMM, hearing grossly normal Respiratory: normal respiratory effort. Cardiovascular: quick capillary refill. RRR Gastrointestinal: soft, NT, ND Nervous: A&O x3. no gross focal neurologic deficits, normal speech Extremities: moves all equally, non-pitting edema diffusely, normal tone. R foot wound in clean/dry vacu dressing Skin: dry, intact, normal temperature, normal color. No rashes, lesions or ulcers on exposed skin Psychiatry: normal mood, congruent affect  Labs   I have personally reviewed the following labs and imaging studies CBC    Component Value Date/Time   WBC 11.5 (H) 12/15/2022 0509   RBC 2.41 (L) 12/15/2022 0509   HGB 7.0 (L) 12/15/2022 0509   HCT 20.9 (L) 12/15/2022 0509   PLT 349 12/15/2022 0509   MCV 86.7 12/15/2022 0509   MCH 29.0 12/15/2022 0509   MCHC 33.5 12/15/2022 0509   RDW 13.5 12/15/2022 0509   LYMPHSABS 1.0 12/03/2022 0007   MONOABS 4.2 (H) 12/03/2022 0007   EOSABS 0.0 12/03/2022 0007   BASOSABS 0.0 12/03/2022 0007      Latest Ref Rng &  Units 12/16/2022   10:17 AM 12/16/2022    6:40 AM 12/16/2022    3:37 AM  BMP  Glucose 70 - 99 mg/dL 97   91   BUN 6 - 20 mg/dL 87   83   Creatinine 0.61 - 1.24 mg/dL 6.01   5.86   Sodium 135 - 145 mmol/L 128   127   Potassium 3.5 - 5.1 mmol/L 6.7  6.5  7.0   Chloride 98 - 111 mmol/L 99   95   CO2 22 - 32 mmol/L 17   16   Calcium 8.9 - 10.3 mg/dL 7.8   7.9    Disposition Plan & Communication  Patient status: Inpatient  Admitted From: Home Planned disposition location: Home Anticipated discharge  date: 2/12 pending completion of IV Abx  Family Communication: none     Author: Richarda Osmond, DO [facility] 12/16/2022, 11:51 AM   Available by Epic secure chat 7AM-7PM. If 7PM-7AM, please contact night-coverage.  [facility] contact information found on [email or web address].

## 2022-12-16 NOTE — Plan of Care (Signed)
  Problem: Fluid Volume: Goal: Hemodynamic stability will improve Outcome: Progressing   Problem: Clinical Measurements: Goal: Diagnostic test results will improve Outcome: Progressing Goal: Signs and symptoms of infection will decrease Outcome: Progressing   Problem: Respiratory: Goal: Ability to maintain adequate ventilation will improve Outcome: Progressing   Problem: Education: Goal: Knowledge of General Education information will improve Description: Including pain rating scale, medication(s)/side effects and non-pharmacologic comfort measures Outcome: Progressing   Problem: Health Behavior/Discharge Planning: Goal: Ability to manage health-related needs will improve Outcome: Progressing   Problem: Clinical Measurements: Goal: Ability to maintain clinical measurements within normal limits will improve Outcome: Progressing Goal: Will remain free from infection Outcome: Progressing Goal: Diagnostic test results will improve Outcome: Progressing Goal: Respiratory complications will improve Outcome: Progressing Goal: Cardiovascular complication will be avoided Outcome: Progressing   Problem: Activity: Goal: Risk for activity intolerance will decrease Outcome: Progressing   Problem: Nutrition: Goal: Adequate nutrition will be maintained Outcome: Progressing   Problem: Coping: Goal: Level of anxiety will decrease Outcome: Progressing   Problem: Elimination: Goal: Will not experience complications related to bowel motility Outcome: Progressing Goal: Will not experience complications related to urinary retention Outcome: Progressing   Problem: Pain Managment: Goal: General experience of comfort will improve Outcome: Progressing   Problem: Safety: Goal: Ability to remain free from injury will improve Outcome: Progressing   Problem: Skin Integrity: Goal: Risk for impaired skin integrity will decrease Outcome: Progressing   Problem: Education: Goal:  Knowledge of the prescribed therapeutic regimen will improve Outcome: Progressing Goal: Ability to verbalize activity precautions or restrictions will improve Outcome: Progressing Goal: Understanding of discharge needs will improve Outcome: Progressing   Problem: Activity: Goal: Ability to perform//tolerate increased activity and mobilize with assistive devices will improve Outcome: Progressing   Problem: Clinical Measurements: Goal: Postoperative complications will be avoided or minimized Outcome: Progressing   Problem: Self-Care: Goal: Ability to meet self-care needs will improve Outcome: Progressing   Problem: Self-Concept: Goal: Ability to maintain and perform role responsibilities to the fullest extent possible will improve Outcome: Progressing   Problem: Pain Management: Goal: Pain level will decrease with appropriate interventions Outcome: Progressing   Problem: Skin Integrity: Goal: Demonstration of wound healing without infection will improve Outcome: Progressing

## 2022-12-16 NOTE — Progress Notes (Signed)
Date and time results received: 12/16/22 0757 (use smartphrase ".now" to insert current time)  Test: K+ Critical Value: 6.5  Name of Provider Notified: Doristine Mango, MD  Orders Received? Or Actions Taken?: No New Orders Received.

## 2022-12-16 NOTE — Progress Notes (Signed)
   12/16/22 9826  Assess: MEWS Score  BP (!) 72/50  MAP (mmHg) (!) 58  Pulse Rate (!) 107  ECG Heart Rate (!) 107  Resp 18  Level of Consciousness Alert  SpO2 96 %  O2 Device Room Air  Patient Activity (if Appropriate) In bed  Assess: MEWS Score  MEWS Temp 0  MEWS Systolic 2  MEWS Pulse 1  MEWS RR 0  MEWS LOC 0  MEWS Score 3  MEWS Score Color Yellow  Assess: if the MEWS score is Yellow or Red  Were vital signs taken at a resting state? Yes  Focused Assessment No change from prior assessment  Does the patient meet 2 or more of the SIRS criteria? Yes  Does the patient have a confirmed or suspected source of infection? Yes  Provider and Rapid Response Notified? Yes  MEWS guidelines implemented *See Row Information* No, vital signs rechecked  Treat  Pain Scale 0-10  Pain Score 0  Take Vital Signs  Increase Vital Sign Frequency  Red: Q 1hr X 4 then Q 4hr X 4, if remains red, continue Q 4hrs  Escalate  MEWS: Escalate Red: discuss with charge nurse/RN and provider, consider discussing with RRT  Notify: Charge Nurse/RN  Name of Charge Nurse/RN Notified Mollie Germany RN  Date Charge Nurse/RN Notified 12/16/22  Time Charge Nurse/RN Notified [phone]  Provider Notification  Provider Name/Title Rathore MD  Date Provider Notified 12/16/22  Time Provider Notified [phone]  Method of Notification Call  Notification Reason Other (Comment) (Low BP)  Provider response See new orders (recheck with manual BP cuff)  Date of Provider Response 12/16/22  Time of Provider Response [phone]  Document  Patient Outcome Stabilized after interventions (BP recheck improved)  Assess: SIRS CRITERIA  SIRS Temperature  0  SIRS Pulse 1  SIRS Respirations  0  SIRS WBC 0  SIRS Score Sum  1

## 2022-12-17 DIAGNOSIS — N179 Acute kidney failure, unspecified: Secondary | ICD-10-CM | POA: Diagnosis not present

## 2022-12-17 DIAGNOSIS — Z9889 Other specified postprocedural states: Secondary | ICD-10-CM | POA: Diagnosis not present

## 2022-12-17 DIAGNOSIS — D649 Anemia, unspecified: Secondary | ICD-10-CM

## 2022-12-17 DIAGNOSIS — D509 Iron deficiency anemia, unspecified: Secondary | ICD-10-CM

## 2022-12-17 DIAGNOSIS — L02611 Cutaneous abscess of right foot: Secondary | ICD-10-CM | POA: Diagnosis not present

## 2022-12-17 DIAGNOSIS — R7881 Bacteremia: Secondary | ICD-10-CM | POA: Diagnosis not present

## 2022-12-17 LAB — BASIC METABOLIC PANEL
Anion gap: 13 (ref 5–15)
Anion gap: 14 (ref 5–15)
BUN: 96 mg/dL — ABNORMAL HIGH (ref 6–20)
BUN: 98 mg/dL — ABNORMAL HIGH (ref 6–20)
CO2: 21 mmol/L — ABNORMAL LOW (ref 22–32)
CO2: 23 mmol/L (ref 22–32)
Calcium: 7.2 mg/dL — ABNORMAL LOW (ref 8.9–10.3)
Calcium: 7.3 mg/dL — ABNORMAL LOW (ref 8.9–10.3)
Chloride: 94 mmol/L — ABNORMAL LOW (ref 98–111)
Chloride: 94 mmol/L — ABNORMAL LOW (ref 98–111)
Creatinine, Ser: 7.03 mg/dL — ABNORMAL HIGH (ref 0.61–1.24)
Creatinine, Ser: 7.27 mg/dL — ABNORMAL HIGH (ref 0.61–1.24)
GFR, Estimated: 9 mL/min — ABNORMAL LOW (ref >60)
GFR, Estimated: 9 mL/min — ABNORMAL LOW (ref >60)
Glucose, Bld: 108 mg/dL — ABNORMAL HIGH (ref 70–99)
Glucose, Bld: 102 mg/dL — ABNORMAL HIGH (ref 70–99)
Potassium: 5.6 mmol/L — ABNORMAL HIGH (ref 3.5–5.1)
Potassium: 5.1 mmol/L (ref 3.5–5.1)
Sodium: 128 mmol/L — ABNORMAL LOW (ref 135–145)
Sodium: 131 mmol/L — ABNORMAL LOW (ref 135–145)

## 2022-12-17 LAB — URINALYSIS, ROUTINE W REFLEX MICROSCOPIC
Bilirubin Urine: NEGATIVE
Glucose, UA: NEGATIVE mg/dL
Ketones, ur: NEGATIVE mg/dL
Nitrite: POSITIVE — AB
Protein, ur: 100 mg/dL — AB
RBC / HPF: >50 RBC/hpf — ABNORMAL HIGH (ref 0–5)
Specific Gravity, Urine: 1.01 (ref 1.005–1.030)
WBC, UA: >50 WBC/hpf — ABNORMAL HIGH (ref 0–5)
pH: 6 (ref 5.0–8.0)

## 2022-12-17 LAB — CBC
HCT: 18.8 % — ABNORMAL LOW (ref 39.0–52.0)
Hemoglobin: 6.3 g/dL — CL (ref 13.0–17.0)
MCH: 28.8 pg (ref 26.0–34.0)
MCHC: 33.5 g/dL (ref 30.0–36.0)
MCV: 85.8 fL (ref 80.0–100.0)
Platelets: 282 10*3/uL (ref 150–400)
RBC: 2.19 MIL/uL — ABNORMAL LOW (ref 4.22–5.81)
RDW: 13.6 % (ref 11.5–15.5)
WBC: 6.8 10*3/uL (ref 4.0–10.5)
nRBC: 0 % (ref 0.0–0.2)

## 2022-12-17 LAB — HEPATITIS B SURFACE ANTIGEN: Hepatitis B Surface Ag: NONREACTIVE

## 2022-12-17 LAB — PROTEIN / CREATININE RATIO, URINE
Creatinine, Urine: 59 mg/dL
Protein Creatinine Ratio: 7.68 mg/mg{Cre} — ABNORMAL HIGH (ref 0.00–0.15)
Total Protein, Urine: 453 mg/dL

## 2022-12-17 LAB — HEPATITIS B CORE ANTIBODY, TOTAL: Hep B Core Total Ab: NONREACTIVE

## 2022-12-17 LAB — PREPARE RBC (CROSSMATCH)

## 2022-12-17 MED ORDER — SODIUM CHLORIDE 0.9 % IV SOLN: INTRAVENOUS | Status: DC

## 2022-12-17 MED ORDER — CHLORHEXIDINE GLUCONATE CLOTH 2 % EX PADS
6.0000 | MEDICATED_PAD | Freq: Every day | CUTANEOUS | Status: DC
  Administered 2022-12-18 – 2023-01-03 (×16): 6 via TOPICAL

## 2022-12-17 MED ORDER — SODIUM CHLORIDE 0.9% IV SOLUTION: Freq: Once | INTRAVENOUS | Status: AC

## 2022-12-17 NOTE — Progress Notes (Signed)
PROGRESS NOTE  Hunter Reyes    DOB: 1976/07/11, 46 y.o.  UUE:[id number]    Code Status: Full Code   DOA: 12/02/2022   LOS: 36   Brief hospital course  Hunter Reyes is a 46 y.o. male with a PMH significant for bipolar 1 disorder, borderline personality disorder, COPD, crack cocaine abuse, depression, peripheral neuropathy, history of right foot osteomyelitis, panic attack and history of seizures.  They presented from home to the ED on 12/02/2022 with AMS associated with low back pain and urinary retention x 3 days. This was following several days of dysuria.   In the ED, it was found that they had normal vital signs. He had acute urinary retention and AKI. Also had MRSA bacteremia with right foot abscess requiring TMA, and lumbar discitis/abscess- requiring emergent laminectomy.   Significant events: 12/10>> admit to [facility] at AP-AKI-urinary retention.  Plans to transfer to [facility]. 12/13>> laminectomy/decompression of ventral lumbar epidural abscess 12/17>> transmetatarsal amputation of right foot 12/21>> failed voiding trial and had worsening kidney function 12/23>> sedated likely in setting of gabapentin toxicity with poor renal function and resistant hyperkalemia. Nephrology re-consulted.  12/24>> received 1u pRBCs 12/25>> slow, gradual improvement in K+ and kidney function   Significant studies: 12/11>> CT renal stone study: Possible discitis L2-L3/L3-L4, moderate bilateral hydronephrosis/hydroureter 12/13>> MRI L-spine: Discitis/osteomyelitis at L3-L4 with 9 mm thick ventral canal phlegmon/abscess which extends from L3 to L4-L5.  Multiple paraspinal abscesses within the psoas musculature. 12/13>> echo: EF 60-65%, no evidence of valvular vegetations. 12/14>> MRI left foot: No osteomyelitis/abscess 12/15>> MRI right foot: Abscess with osteomyelitis of second/third metatarsals.   Significant microbiology data: 12/10>> COVID/influenza PCR: Negative 12/11>> blood culture:  MRSA 12/13>> blood culture: MRSA 12/13>> epidural abscess culture: MRSA 12/14>> blood culture: No growth   Procedures: 12/13>> right L4 laminectomy-sublaminar decompression of ventral epidural abscess. 12/17>> transmetatarsal amputation of right foot  12/17/22 -stable  Assessment & Plan  Principal Problem:   MRSA bacteremia Active Problems:   AKI (acute kidney injury) ([place])   Diskitis   Bipolar 1 disorder (HCC)   Peripheral neuropathy   Polysubstance abuse (HCC)   Metabolic acidosis   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 (HCC)   Cutaneous abscess of right foot   H/O laminectomy   Acute urinary retention   Acute renal failure (HCC)   Hyperkalemia  obstructive uropathy/Urinary retention- failed voiding trial. Foley placed again 12/21. Repeat renal US 12/23 showed moderate hydronephrosis on right and mild to moderate on left. Dark urine in drainage bag. >1.6L UOP yesterday - continue foley, will remain in until urology f/u - continue flomax - repeat renal US at future date to ensure resolution   AKI  Hyponatremia  hyperkalemia-   Cr remains elevated >7 despite foley being replaced at least 72 hours again now. creatinine on admission=20.99 (12/03/22)  ,  baseline creatinine = previously normal renal function 5 years ago no recent renal function available K+ remains elevated despite treatment. 6.9>5.8>7.0>6.5>6.7>5.6. non-peaked T-waves on ECG.  - re-consulted nephrology to evaluate for urgent dialysis, appreciate recs - BMP q8 hours.  - continue lokelma 10mg  TID - continue PRN albuterol and insulin/dextrose for hyperkalemia shift - continue IV fluids - ECGs PRN and continuous cardiac telemetry.   - s/p 2 doses of calcium gluconate total. - stop gabapentin as patient appears to have intoxication at new renal function level - switched to subq heparin dvt ppx     MRSA bacteremia- Overall improved-repeat cultures NGTD ID recommending 8 weeks of  IV Daptomycin-end date  02/03/2023, repeat MRI L spine on 01/02/23- and to reconsult ID around 01/02/2023 to revisit  antibiotic plan following repeat imaging Given history of drug use-unfortunately will remain inpatient to complete IV antibiotic duration.  Not a candidate for home therapies. -SNF facilities will not take patient due to drug use  L3-L4 discitis with epidural abscess-s/p laminectomy/decompression on 12/13 - neurosurgery following, appreciate recs - analgesia PRN  Right foot osteomyelitis/abscess-s/p transmetatarsal amputation on 12/17. Wound vac in place with serosangenous drainage.  - Orthopedics following, appreciate recs  - needs to follow up wound vac - Continue to work with PT/OT   acute anemia--multifactorial etiology. Hgb was 13 on admission on 12/03/2022. Gradually decreasing to <7 yesterday as well as hypotension and tachycardia- transfused 1u pRBCs 12/24. Now hemodynamically stable. Post-transfusion CBC was not drawn as it was ordered. CBC this am pending.  -Transfuse if hemodynamically unstable or Hgb below 7 -no Bleeding concerns at this time - CBC am   Very poor dentition/dental caries noted--- outpatient follow-up with dentist advised   Peripheral neuropathy Continue Neurontin   Bipolar 1 disorder Stable Continue Abilify   Polysubstance abuse- UDS negative on admission on 12/03/2022 History of cocaine use - continue Suboxone, renal adjust as needed   Chronic hepatitis B infection defer further to infectious disease   Morbid Obesity: Estimated body mass index is 42.18 kg/m as calculated from the following:   Height as of this encounter: 6\' 2"  (1.88 m).   Weight as of this encounter: 149 kg.   Body mass index is 42.18 kg/m.  VTE ppx: heparin injection 5,000 Units Start: 12/16/22 1000 SCD's Start: 12/09/22 1137  Diet:     Diet   Diet renal with fluid restriction Fluid restriction: 2000 mL Fluid; Room service appropriate? Yes; Fluid consistency: Thin    Consultants: ID Neurosurgery  Orthopedic surgery nephrology  Subjective 12/17/22    Pt reports no complaints today. He appears to be confused but responds to all questions and conversation appropriately.    Objective   Vitals:   12/17/22 0038 12/17/22 0112 12/17/22 0332 12/17/22 0401  BP: 97/67 (!) 97/44 (!) 112/59 114/62  Pulse: (!) 109 (!) 103 93 97  Resp: 20 20 20 18   Temp: 99.8 F (37.7 C) 99.6 F (37.6 C) 100 F (37.8 C) 99.6 F (37.6 C)  TempSrc: Oral Oral Oral Oral  SpO2: 95% 92% 94% 94%  Weight:      Height:        Intake/Output Summary (Last 24 hours) at 12/17/2022 0904 Last data filed at 12/17/2022 0400 Gross per 24 hour  Intake 1364.67 ml  Output 0 ml  Net 1364.67 ml    Filed Weights   12/02/22 2230  Weight: (!) 149 kg     Physical Exam:  General: awake, alert, NAD HEENT: atraumatic, clear conjunctiva, anicteric sclera, MMM, hearing grossly normal Respiratory: normal respiratory effort. Cardiovascular: quick capillary refill. RRR Gastrointestinal: soft, NT, ND Nervous: A&O x3. no gross focal neurologic deficits, normal speech Extremities: moves all equally, non-pitting edema diffusely, normal tone. R foot wound in clean/dry vacu dressing Skin: dry, intact, normal temperature, normal color. No rashes, lesions or ulcers on exposed skin Psychiatry: normal mood, congruent affect  Labs   I have personally reviewed the following labs and imaging studies CBC    Component Value Date/Time   WBC 8.2 12/16/2022 1344   RBC 2.22 (L) 12/16/2022 1344   HGB 6.5 (LL) 12/16/2022 1344   HCT 19.0 (L) 12/16/2022 1344  PLT 301 12/16/2022 1344   MCV 85.6 12/16/2022 1344   MCH 29.3 12/16/2022 1344   MCHC 34.2 12/16/2022 1344   RDW 13.3 12/16/2022 1344   LYMPHSABS 1.0 12/03/2022 0007   MONOABS 4.2 (H) 12/03/2022 0007   EOSABS 0.0 12/03/2022 0007   BASOSABS 0.0 12/03/2022 0007      Latest Ref Rng & Units 12/16/2022   10:00 PM 12/16/2022    6:46 PM  12/16/2022    1:44 PM  BMP  Glucose 70 - 99 mg/dL 101  90  93   BUN 6 - 20 mg/dL 84  90  89   Creatinine 0.61 - 1.24 mg/dL 6.48  6.18  6.00   Sodium 135 - 145 mmol/L 132  129  126   Potassium 3.5 - 5.1 mmol/L 5.8  6.3  6.6   Chloride 98 - 111 mmol/L 97  96  97   CO2 22 - 32 mmol/L 21  19  18    Calcium 8.9 - 10.3 mg/dL 7.5  7.7  7.5    Disposition Plan & Communication  Patient status: Inpatient  Admitted From: Home Planned disposition location: Home Anticipated discharge date: 2/12 pending completion of IV Abx  Family Communication: none     Author: Richarda Osmond, DO [facility] 12/17/2022, 9:04 AM   Available by Epic secure chat 7AM-7PM. If 7PM-7AM, please contact night-coverage.  [facility] contact information found on [email or web address].

## 2022-12-17 NOTE — Progress Notes (Signed)
[facility] Progress Note   Assessment/ Plan:   1. AKI: recurrent, d/t obstructive uropathy. Good UOP.  - continue Foley  - urine is tea-colored- want to make sure there's no syn-infectious GN- will do serologic workup as well, sending UA and UP/C too     - Cr continues to climb, will need RRT  - IR to place temp cath, hopefully in AM  - HD orders written for tomorrow  - he did have some low BP--> ? Renal papillary necrosis, consider CT without contrast again  2.  Hyperkalemia:  - changed to renal diet  - s/p albuterol this AM  - s/p bicarb gtt  - lokelma TID  - s/p calcium gluconate  - serial BMP checks  - will need HD as above in #1  3.  MRSA bacteremia  - on daptomycin  4.  Anemia:  - getting transfusion today it appears  5.  Dispo: admitted  Subjective:    Seen in room.  Making urine- looks tea-colored.  K better but Cr climbing.  I think we're going to need RRT.    D/w pt- he easily falls asleep but when awakened is appropriate.  Placed IR order for temp cath and HD orders for tomorrow once temp cath done.     Objective:   BP 108/62 (BP Location: Right Arm)   Pulse 92   Temp 99.3 F (37.4 C) (Oral)   Resp 19   Ht 6\' 2"  (1.88 m)   Wt (!) 149 kg   SpO2 95%   BMI 42.18 kg/m   Intake/Output Summary (Last 24 hours) at 12/17/2022 1344 Last data filed at 12/17/2022 0400 Gross per 24 hour  Intake 1364.67 ml  Output 0 ml  Net 1364.67 ml   Weight change:   Physical Exam: [id number] chronically ill HEENT: dry MM CVS: tachycardic Resp: clear Abd: soft Ext: 1+ trace edema GU: tea-colored urine  Imaging: No results found.  Labs: BMET Recent Labs  Lab 12/11/22 0453 12/12/22 0321 12/13/22 0431 12/14/22 0418 12/15/22 0505 12/15/22 0509 12/15/22 1627 12/15/22 2038 12/16/22 0337 12/16/22 0640 12/16/22 1017 12/16/22 1344 12/16/22 1846 12/16/22 2200 12/17/22 1022  NA 136 140 137 133*   < > 128*   < > 126* 127*  --  128* 126*  129* 132* 128*  K 4.2 4.1 4.2 5.1   < > 5.8*   < > 5.8* 7.0* 6.5* 6.7* 6.6* 6.3* 5.8* 5.6*  CL 104 107 106 101   < > 97*   < > 95* 95*  --  99 97* 96* 97* 94*  CO2 24 23 23 22    < > 19*   < > 17* 16*  --  17* 18* 19* 21* 21*  GLUCOSE 93 96 94 103*   < > 96   < > 75 91  --  97 93 90 101* 108*  BUN 53* 45* 39* 51*   < > 76*   < > 83* 83*  --  87* 89* 90* 84* 96*  CREATININE 1.83* 1.74* 1.91* 3.46*   < > 5.17*   < > 5.49* 5.86*  --  6.01* 6.00* 6.18* 6.48* 7.03*  CALCIUM 8.2* 7.9* 7.9* 8.3*   < > 7.7*   < > 7.9* 7.9*  --  7.8* 7.5* 7.7* 7.5* 7.2*  PHOS 4.3 5.1* 5.6* 5.9*  --  7.5*  --   --  7.3*  --   --   --   --   --   --    < > =  values in this interval not displayed.   CBC Recent Labs  Lab 12/11/22 0453 12/14/22 0418 12/15/22 0509 12/16/22 1344  WBC 13.8* 8.9 11.5* 8.2  HGB 8.2* 7.7* 7.0* 6.5*  HCT 24.5* 23.3* 20.9* 19.0*  MCV 87.5 87.6 86.7 85.6  PLT 304 345 349 301    Medications:     albuterol  2.5 mg Inhalation Q4H   buprenorphine-naloxone  1 tablet Sublingual BID   Chlorhexidine Gluconate Cloth  6 each Topical Q0600   heparin injection (subcutaneous)  5,000 Units Subcutaneous Q8H   LORazepam  1 mg Intravenous Once   nutrition supplement (JUVEN)  1 packet Oral BID BM   pantoprazole  40 mg Oral Daily   polyethylene glycol  17 g Oral Daily   sertraline  25 mg Oral Daily   sodium zirconium cyclosilicate  10 g Oral TID   tamsulosin  0.4 mg Oral QPC supper   thiamine  500 mg Oral Daily    Madelon Lips MD 12/17/2022, 1:44 PM

## 2022-12-18 ENCOUNTER — Inpatient Hospital Stay (HOSPITAL_COMMUNITY): Payer: Medicaid Other

## 2022-12-18 DIAGNOSIS — R7881 Bacteremia: Secondary | ICD-10-CM | POA: Diagnosis not present

## 2022-12-18 DIAGNOSIS — M86179 Other acute osteomyelitis, unspecified ankle and foot: Secondary | ICD-10-CM | POA: Diagnosis not present

## 2022-12-18 DIAGNOSIS — M4647 Discitis, unspecified, lumbosacral region: Secondary | ICD-10-CM | POA: Diagnosis not present

## 2022-12-18 DIAGNOSIS — N179 Acute kidney failure, unspecified: Secondary | ICD-10-CM | POA: Diagnosis not present

## 2022-12-18 DIAGNOSIS — F1121 Opioid dependence, in remission: Secondary | ICD-10-CM

## 2022-12-18 DIAGNOSIS — N170 Acute kidney failure with tubular necrosis: Principal | ICD-10-CM

## 2022-12-18 HISTORY — PX: IR US GUIDE VASC ACCESS RIGHT: IMG2390

## 2022-12-18 HISTORY — PX: IR FLUORO GUIDE CV LINE RIGHT: IMG2283

## 2022-12-18 LAB — TYPE AND SCREEN
ABO/RH(D): O POS
Antibody Screen: NEGATIVE
Unit division: 0
Unit division: 0

## 2022-12-18 LAB — BASIC METABOLIC PANEL
Anion gap: 15 (ref 5–15)
Anion gap: 15 (ref 5–15)
BUN: 99 mg/dL — ABNORMAL HIGH (ref 6–20)
BUN: 101 mg/dL — ABNORMAL HIGH (ref 6–20)
CO2: 22 mmol/L (ref 22–32)
CO2: 22 mmol/L (ref 22–32)
Calcium: 7.2 mg/dL — ABNORMAL LOW (ref 8.9–10.3)
Calcium: 7.3 mg/dL — ABNORMAL LOW (ref 8.9–10.3)
Chloride: 96 mmol/L — ABNORMAL LOW (ref 98–111)
Chloride: 98 mmol/L (ref 98–111)
Creatinine, Ser: 7.07 mg/dL — ABNORMAL HIGH (ref 0.61–1.24)
Creatinine, Ser: 7.22 mg/dL — ABNORMAL HIGH (ref 0.61–1.24)
GFR, Estimated: 9 mL/min — ABNORMAL LOW (ref >60)
GFR, Estimated: 9 mL/min — ABNORMAL LOW (ref >60)
Glucose, Bld: 109 mg/dL — ABNORMAL HIGH (ref 70–99)
Glucose, Bld: 100 mg/dL — ABNORMAL HIGH (ref 70–99)
Potassium: 4.8 mmol/L (ref 3.5–5.1)
Potassium: 5 mmol/L (ref 3.5–5.1)
Sodium: 133 mmol/L — ABNORMAL LOW (ref 135–145)
Sodium: 135 mmol/L (ref 135–145)

## 2022-12-18 LAB — BPAM RBC
Blood Product Expiration Date: 202401182359
Blood Product Expiration Date: 202401192359
ISSUE DATE / TIME: 202312250044
ISSUE DATE / TIME: 202312251801
Unit Type and Rh: 5100
Unit Type and Rh: 5100

## 2022-12-18 LAB — CBC
HCT: 23.2 % — ABNORMAL LOW (ref 39.0–52.0)
Hemoglobin: 7.6 g/dL — ABNORMAL LOW (ref 13.0–17.0)
MCH: 28.5 pg (ref 26.0–34.0)
MCHC: 32.8 g/dL (ref 30.0–36.0)
MCV: 86.9 fL (ref 80.0–100.0)
Platelets: 281 10*3/uL (ref 150–400)
RBC: 2.67 MIL/uL — ABNORMAL LOW (ref 4.22–5.81)
RDW: 13.9 % (ref 11.5–15.5)
WBC: 7.2 10*3/uL (ref 4.0–10.5)
nRBC: 0 % (ref 0.0–0.2)

## 2022-12-18 LAB — HEPATITIS B SURFACE ANTIBODY,QUALITATIVE: Hep B S Ab: NONREACTIVE

## 2022-12-18 LAB — HEPATITIS C ANTIBODY: HCV Ab: NONREACTIVE

## 2022-12-18 MED ORDER — PENTAFLUOROPROP-TETRAFLUOROETH EX AERO: 1.0000 | INHALATION_SPRAY | CUTANEOUS | Status: DC | PRN

## 2022-12-18 MED ORDER — SODIUM ZIRCONIUM CYCLOSILICATE 5 G PO PACK
5.0000 g | PACK | Freq: Three times a day (TID) | ORAL | Status: DC
  Administered 2022-12-18: 5 g via ORAL
  Filled 2022-12-18 (×4): qty 1

## 2022-12-18 MED ORDER — HEPARIN SODIUM (PORCINE) 1000 UNIT/ML DIALYSIS
1000.0000 [IU] | INTRAMUSCULAR | Status: DC | PRN
  Filled 2022-12-18: qty 1

## 2022-12-18 MED ORDER — HEPARIN SODIUM (PORCINE) 1000 UNIT/ML IJ SOLN
INTRAMUSCULAR | Status: AC
  Administered 2022-12-18: 2600 [IU]
  Filled 2022-12-18: qty 10

## 2022-12-18 MED ORDER — ALTEPLASE 2 MG IJ SOLR: 2.0000 mg | Freq: Once | INTRAMUSCULAR | Status: DC | PRN

## 2022-12-18 MED ORDER — LIDOCAINE-PRILOCAINE 2.5-2.5 % EX CREA: 1.0000 | TOPICAL_CREAM | CUTANEOUS | Status: DC | PRN

## 2022-12-18 MED ORDER — LIDOCAINE HCL 1 % IJ SOLN
INTRAMUSCULAR | Status: AC
  Administered 2022-12-18: 7 mL via INTRADERMAL
  Filled 2022-12-18: qty 20

## 2022-12-18 MED ORDER — LIDOCAINE HCL (PF) 1 % IJ SOLN: 5.0000 mL | INTRAMUSCULAR | Status: DC | PRN

## 2022-12-18 MED ORDER — ANTICOAGULANT SODIUM CITRATE 4% (200MG/5ML) IV SOLN
5.0000 mL | Status: DC | PRN
  Filled 2022-12-18: qty 5

## 2022-12-18 MED ORDER — HEPARIN SOD (PORK) LOCK FLUSH 100 UNIT/ML IV SOLN
INTRAVENOUS | Status: AC
  Filled 2022-12-18: qty 5

## 2022-12-18 NOTE — Progress Notes (Signed)
PROGRESS NOTE  Hunter Reyes    DOB: 08-15-1976, 46 y.o.  OEV:[id number]    Code Status: Full Code   DOA: 12/02/2022   LOS: 67   Brief hospital course  Hunter Reyes is a 46 y.o. male with a PMH significant for bipolar 1 disorder, borderline personality disorder, COPD, crack cocaine abuse, depression, peripheral neuropathy, history of right foot osteomyelitis, panic attack and history of seizures.  They presented from home to the ED on 12/02/2022 with AMS associated with low back pain and urinary retention x 3 days. This was following several days of dysuria.   In the ED, it was found that they had normal vital signs. He had acute urinary retention and AKI. Also had MRSA bacteremia with right foot abscess requiring TMA, and lumbar discitis/abscess- requiring emergent laminectomy.   Significant events: 12/10>> admit to [facility] at [facility]-AKI-urinary retention.  Plans to transfer to [facility]. 12/13>> laminectomy/decompression of ventral lumbar epidural abscess 12/17>> transmetatarsal amputation of right foot 12/21>> failed voiding trial and had worsening kidney function 12/23>> sedated likely in setting of gabapentin toxicity with poor renal function and resistant hyperkalemia. Nephrology re-consulted.  12/24>> received 1u pRBCs 12/25>> slow, gradual improvement in K+ and kidney function. Contacted ortho to re-evaluate the amputation/wound vac 12/26>> electrolytes normalized but still having kidney failure, dark urine, and requiring HD. IR placing temp cath   Significant studies: 12/11>> CT renal stone study: Possible discitis L2-L3/L3-L4, moderate bilateral hydronephrosis/hydroureter 12/13>> MRI L-spine: Discitis/osteomyelitis at L3-L4 with 9 mm thick ventral canal phlegmon/abscess which extends from L3 to L4-L5.  Multiple paraspinal abscesses within the psoas musculature. 12/13>> echo: EF 60-65%, no evidence of valvular vegetations. 12/14>> MRI left foot: No osteomyelitis/abscess 12/15>> MRI  right foot: Abscess with osteomyelitis of second/third metatarsals.   Significant microbiology data: 12/10>> COVID/influenza PCR: Negative 12/11>> blood culture: MRSA 12/13>> blood culture: MRSA 12/13>> epidural abscess culture: MRSA 12/14>> blood culture: No growth   Procedures: 12/13>> right L4 laminectomy-sublaminar decompression of ventral epidural abscess. 12/17>> transmetatarsal amputation of right foot  12/18/22 -stable  Assessment & Plan  Principal Problem:   MRSA bacteremia Active Problems:   AKI (acute kidney injury) ([place])   Diskitis   Bipolar 1 disorder ([place])   Peripheral neuropathy   Polysubstance abuse (HCC)   Metabolic acidosis   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 (HCC)   Cutaneous abscess of right foot   H/O laminectomy   Acute urinary retention   Acute renal failure (HCC)   Hyperkalemia   Acute anemia  obstructive uropathy/Urinary retention- failed voiding trial. Foley placed again 12/21. Repeat renal US 12/23 showed moderate hydronephrosis on right and mild to moderate on left. Dark urine in drainage bag. >1.7L UOP yesterday - continue foley, will remain in until urology f/u - continue flomax - repeat renal US at future date to ensure resolution   Acute renal failure  Hyponatremia  hyperkalemia-   Cr remains elevated >7 despite foley being replaced at least 4 days again now. creatinine on admission=20.99 (12/03/22)  ,  baseline creatinine = previously normal renal function 5 years ago no recent renal function available K+ resistant to treatment. 6.9>5.8>7.0>6.5>6.7>5.6 has now finally reduced to 5.0 today. non-peaked T-waves on ECG.  - re-consulted nephrology to evaluate for urgent dialysis, appreciate recs  - plan to start  - BMP q8 hours.  - continue lokelma 5mg  TID - continue PRN albuterol and insulin/dextrose for hyperkalemia shift - continue IV fluids - ECGs PRN and continuous cardiac telemetry.   - s/p  2 doses of calcium gluconate  total. - stopped gabapentin as patient appears to have intoxication at new renal function level - switched to subq heparin dvt ppx     MRSA bacteremia- Overall improved-repeat cultures NGTD ID recommending 8 weeks of IV Daptomycin-end date 02/03/2023, repeat MRI L spine on 01/02/23- and to reconsult ID around 01/02/2023 to revisit  antibiotic plan following repeat imaging Given history of drug use-unfortunately will remain inpatient to complete IV antibiotic duration.  Not a candidate for home therapies. -SNF facilities will not take patient due to drug use  L3-L4 discitis with epidural abscess-s/p laminectomy/decompression on 12/13 - neurosurgery following, appreciate recs - analgesia PRN  Right foot osteomyelitis/abscess-s/p transmetatarsal amputation on 12/17. Wound vac in place with serosangenous drainage.  - Orthopedics following, appreciate recs  - needs to follow up wound vac - Continue to work with PT/OT   acute anemia--multifactorial etiology. Hgb was 13 on admission on 12/03/2022. Gradually decreasing to <7 yesterday as well as hypotension and tachycardia- transfused 1u pRBCs 12/24, and additional unit 12/25. Now hemodynamically stable. Hgb 7.6 -Transfuse if hemodynamically unstable or Hgb below 7 -no Bleeding concerns at this time - CBC am   Very poor dentition/dental caries noted--- outpatient follow-up with dentist advised   Peripheral neuropathy - Continue Neurontin   Bipolar 1 disorder- Stable - Continue Abilify   Polysubstance abuse- UDS negative on admission on 12/03/2022 History of cocaine use - continue Suboxone, renal adjust as needed   Chronic hepatitis B infection defer further to infectious disease   Morbid Obesity: Estimated body mass index is 42.18 kg/m as calculated from the following:   Height as of this encounter: 6\' 2"  (1.88 m).   Weight as of this encounter: 149 kg.   Body mass index is 42.18 kg/m.  VTE ppx: heparin injection 5,000 Units  Start: 12/16/22 1000 SCD's Start: 12/09/22 1137  Diet:     Diet   Diet NPO time specified   Consultants: ID Neurosurgery  Orthopedic surgery Nephrology  IR   Subjective 12/18/22    Pt reports no complaints today. He appears to be confused but responds to all questions and conversation appropriately.    Objective   Vitals:   12/18/22 0042 12/18/22 0359 12/18/22 0400 12/18/22 0718  BP:   112/63 109/68  Pulse: 89 98 97 88  Resp: 19 17 18 19   Temp:   98.9 F (37.2 C) 98.7 F (37.1 C)  TempSrc:   Oral Oral  SpO2: 94% (!) 61% 96% 96%  Weight:      Height:        Intake/Output Summary (Last 24 hours) at 12/18/2022 0804 Last data filed at 12/18/2022 0451 Gross per 24 hour  Intake 2196.29 ml  Output 1700 ml  Net 496.29 ml    Filed Weights   12/02/22 2230  Weight: (!) 149 kg     Physical Exam:  General: awake, alert, NAD HEENT: atraumatic, clear conjunctiva, anicteric sclera, MMM, hearing grossly normal Respiratory: normal respiratory effort. Cardiovascular: quick capillary refill. RRR Gastrointestinal: soft, NT, ND Nervous: A&O x3. no gross focal neurologic deficits, normal speech Extremities: moves all equally, non-pitting edema diffusely, normal tone. R foot wound in clean/dry vacu dressing Skin: dry, intact, normal temperature, normal color. No rashes, lesions or ulcers on exposed skin Psychiatry: normal mood, congruent affect  Labs   I have personally reviewed the following labs and imaging studies CBC    Component Value Date/Time   WBC 7.2 12/18/2022 0626   RBC 2.67 (  L) 12/18/2022 0626   HGB 7.6 (L) 12/18/2022 0626   HCT 23.2 (L) 12/18/2022 0626   PLT 281 12/18/2022 0626   MCV 86.9 12/18/2022 0626   MCH 28.5 12/18/2022 0626   MCHC 32.8 12/18/2022 0626   RDW 13.9 12/18/2022 0626   LYMPHSABS 1.0 12/03/2022 0007   MONOABS 4.2 (H) 12/03/2022 0007   EOSABS 0.0 12/03/2022 0007   BASOSABS 0.0 12/03/2022 0007      Latest Ref Rng & Units 12/18/2022     6:26 AM 12/17/2022   11:15 PM 12/17/2022    6:07 PM  BMP  Glucose 70 - 99 mg/dL 100  109  102   BUN 6 - 20 mg/dL 101  99  98   Creatinine 0.61 - 1.24 mg/dL 7.22  7.07  7.27   Sodium 135 - 145 mmol/L 135  133  131   Potassium 3.5 - 5.1 mmol/L 5.0  4.8  5.1   Chloride 98 - 111 mmol/L 98  96  94   CO2 22 - 32 mmol/L 22  22  23    Calcium 8.9 - 10.3 mg/dL 7.3  7.2  7.3    Disposition Plan & Communication  Patient status: Inpatient  Admitted From: Home Planned disposition location: Home Anticipated discharge date: 2/12 pending completion of IV Abx  Family Communication: none     Author: Richarda Osmond, DO [facility] 12/18/2022, 8:04 AM   Available by Epic secure chat 7AM-7PM. If 7PM-7AM, please contact night-coverage.  [facility] contact information found on [email or web address].

## 2022-12-18 NOTE — Progress Notes (Signed)
[facility] Progress Note   Assessment/ Plan:   1. AKI: recurrent, d/t obstructive uropathy. Good UOP.  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  May end up considering biopsy, however doubt would change management so will not pursue today.  - Cr continues to climb, will need RRT  - IR to place temp cath, today  - HD orders written for today  2.  Hyperkalemia:  has improved with medical management.   - cont to renal diet  - lokelma TID - will d/c once on HD  3.  MRSA bacteremia  - on daptomycin  4.  Anemia:  - being transfused PRN per primary  5.  Dispo: admitted  Subjective:    Seen in room - agreeable to dialysis; no new issues reported.   UOP 1.,7L yest   Objective:   BP 109/68 (BP Location: Right Arm)   Pulse 88   Temp 98.7 F (37.1 C) (Oral)   Resp 19   Ht 6\' 2"  (1.88 m)   Wt (!) 149 kg   SpO2 96%   BMI 42.18 kg/m   Intake/Output Summary (Last 24 hours) at 12/18/2022 0959 Last data filed at 12/18/2022 0451 Gross per 24 hour  Intake 2196.29 ml  Output 1700 ml  Net 496.29 ml    Weight change:   Physical Exam: [id number]:JIRCVEL chronically ill  HEENT: MMM CVS: RRR. No rub Resp: clear Abd: soft Ext: 1+ trace edema GU: tea-colored urine  Imaging: No results found.  Labs: BMET Recent Labs  Lab 12/12/22 0321 12/13/22 0431 12/14/22 0418 12/15/22 0505 12/15/22 0509 12/15/22 1627 12/16/22 0337 12/16/22 0640 12/16/22 1344 12/16/22 1846 12/16/22 2200 12/17/22 1022 12/17/22 1807 12/17/22 2315 12/18/22 0626  NA 140 137 133*   < > 128*   < > 127*   < > 126* 129* 132* 128* 131* 133* 135  K 4.1 4.2 5.1   < > 5.8*   < > 7.0*   < > 6.6* 6.3* 5.8* 5.6* 5.1 4.8 5.0  CL 107 106 101   < > 97*   < > 95*   < > 97* 96* 97* 94* 94* 96* 98  CO2 23 23 22    < > 19*   < > 16*   < > 18* 19* 21* 21* 23 22 22   GLUCOSE 96 94 103*   < > 96   < > 91   < > 93 90 101* 108*  102* 109* 100*  BUN 45* 39* 51*   < > 76*   < > 83*   < > 89* 90* 84* 96* 98* 99* 101*  CREATININE 1.74* 1.91* 3.46*   < > 5.17*   < > 5.86*   < > 6.00* 6.18* 6.48* 7.03* 7.27* 7.07* 7.22*  CALCIUM 7.9* 7.9* 8.3*   < > 7.7*   < > 7.9*   < > 7.5* 7.7* 7.5* 7.2* 7.3* 7.2* 7.3*  PHOS 5.1* 5.6* 5.9*  --  7.5*  --  7.3*  --   --   --   --   --   --   --   --    < > = values in this interval not displayed.    CBC Recent Labs  Lab 12/15/22 0509 12/16/22 1344 12/17/22 1022 12/18/22 0626  WBC 11.5* 8.2 6.8 7.2  HGB 7.0* 6.5* 6.3* 7.6*  HCT 20.9* 19.0* 18.8* 23.2*  MCV 86.7 85.6 85.8 86.9  PLT 349 301 282 281     Medications:     albuterol  2.5 mg Inhalation Q4H   buprenorphine-naloxone  1 tablet Sublingual BID   Chlorhexidine Gluconate Cloth  6 each Topical Q0600   Chlorhexidine Gluconate Cloth  6 each Topical Q0600   heparin injection (subcutaneous)  5,000 Units Subcutaneous Q8H   LORazepam  1 mg Intravenous Once   nutrition supplement (JUVEN)  1 packet Oral BID BM   pantoprazole  40 mg Oral Daily   polyethylene glycol  17 g Oral Daily   sertraline  25 mg Oral Daily   sodium zirconium cyclosilicate  10 g Oral TID   tamsulosin  0.4 mg Oral QPC supper   thiamine  500 mg Oral Daily    Jannifer Hick MD [facility] Pager [phone]

## 2022-12-18 NOTE — Progress Notes (Signed)
PT Cancellation Note  Patient Details Name: Hunter Reyes MRN: [id number] DOB: February 05, 1976   Cancelled Treatment:    Reason Eval/Treat Not Completed: Patient at procedure or test/unavailable   Shary Decamp [facility] 12/18/2022, 2:27 PM Kristal Perl [place] Office [phone]

## 2022-12-18 NOTE — Plan of Care (Signed)
  Problem: Fluid Volume: Goal: Hemodynamic stability will improve Outcome: Progressing   Problem: Clinical Measurements: Goal: Diagnostic test results will improve Outcome: Progressing Goal: Signs and symptoms of infection will decrease Outcome: Progressing   Problem: Respiratory: Goal: Ability to maintain adequate ventilation will improve Outcome: Progressing   Problem: Education: Goal: Knowledge of General Education information will improve Description: Including pain rating scale, medication(s)/side effects and non-pharmacologic comfort measures Outcome: Progressing   Problem: Health Behavior/Discharge Planning: Goal: Ability to manage health-related needs will improve Outcome: Progressing   Problem: Clinical Measurements: Goal: Ability to maintain clinical measurements within normal limits will improve Outcome: Progressing Goal: Will remain free from infection Outcome: Progressing Goal: Diagnostic test results will improve Outcome: Progressing Goal: Respiratory complications will improve Outcome: Progressing Goal: Cardiovascular complication will be avoided Outcome: Progressing   Problem: Activity: Goal: Risk for activity intolerance will decrease Outcome: Progressing   Problem: Nutrition: Goal: Adequate nutrition will be maintained Outcome: Progressing   Problem: Coping: Goal: Level of anxiety will decrease Outcome: Progressing   Problem: Elimination: Goal: Will not experience complications related to bowel motility Outcome: Progressing Goal: Will not experience complications related to urinary retention Outcome: Progressing   Problem: Pain Managment: Goal: General experience of comfort will improve Outcome: Progressing   Problem: Safety: Goal: Ability to remain free from injury will improve Outcome: Progressing   Problem: Skin Integrity: Goal: Risk for impaired skin integrity will decrease Outcome: Progressing   Problem: Education: Goal:  Knowledge of the prescribed therapeutic regimen will improve Outcome: Progressing Goal: Ability to verbalize activity precautions or restrictions will improve Outcome: Progressing Goal: Understanding of discharge needs will improve Outcome: Progressing   Problem: Activity: Goal: Ability to perform//tolerate increased activity and mobilize with assistive devices will improve Outcome: Progressing   Problem: Clinical Measurements: Goal: Postoperative complications will be avoided or minimized Outcome: Progressing   Problem: Self-Care: Goal: Ability to meet self-care needs will improve Outcome: Progressing   Problem: Self-Concept: Goal: Ability to maintain and perform role responsibilities to the fullest extent possible will improve Outcome: Progressing   Problem: Pain Management: Goal: Pain level will decrease with appropriate interventions Outcome: Progressing   Problem: Skin Integrity: Goal: Demonstration of wound healing without infection will improve Outcome: Progressing

## 2022-12-18 NOTE — Progress Notes (Signed)
Received patient in bed to unit.  Alert and oriented.  Informed consent signed and in chart.   Treatment initiated: 1549 Treatment completed: 1750  Patient HD line clotted off last 36 minutes of the treatment  Transported back to the room  Alert, without acute distress.  Hand-off given to patient's nurse.   Access used: catheter Access issues: clotted off W:134.1KG    12/18/22 1750  Vitals  Temp 98.2 F (36.8 C)  Temp Source Oral  BP (!) 121/55  MAP (mmHg) 74  BP Location Right Arm  BP Method Automatic  Patient Position (if appropriate) Lying  ECG Heart Rate (!) 103  Resp (!) 21  Oxygen Therapy  SpO2 98 %  O2 Device Nasal Cannula  O2 Flow Rate (L/min) 1 L/min  During Treatment Monitoring  Intra-Hemodialysis Comments Tx completed  Post Treatment  Dialyzer Clearance Clear  Duration of HD Treatment -hour(s) 1.54 hour(s)  Hemodialysis Intake (mL) 120 mL  Liters Processed 28.4  Fluid Removed (mL) 600 mL  Tolerated HD Treatment No (Comment)  Post-Hemodialysis Comments patiet clotted off last 36 mins  Hemodialysis Catheter Right Internal jugular Double lumen Temporary (Non-Tunneled)  Placement Date/Time: 12/18/22 1424   Serial / Lot #: [id number]  Expiration Date: 12/22/26  Time Out: Correct patient;Correct site;Correct procedure  Maximum sterile barrier precautions: Hand hygiene;Cap;Mask;Sterile gown;Sterile gloves;Large sterile ...  Site Condition Painful  Blue Lumen Status Heparin locked  Red Lumen Status Heparin locked  Catheter fill volume (Arterial) 1.3 cc  Catheter fill volume (Venous) 1.3  Dressing Type Gauze/Drain sponge  Dressing Change Due 12/19/22  Post treatment catheter status Capped and Clamped    Total UF removed: [facility]

## 2022-12-18 NOTE — Procedures (Signed)
  Procedure:  R IJ Trialysis CVC placement 16cm Preprocedure diagnosis: The primary encounter diagnosis was Acute renal failure, unspecified acute renal failure type ([place]). Diagnoses of MRSA bacteremia and Cutaneous abscess of right foot were also pertinent to this visit.  Postprocedure diagnosis: same EBL:    minimal Complications:   none immediate  See full dictation in [facility].  Dillard Cannon MD Main # [phone] Pager  [phone] Mobile [phone]

## 2022-12-19 DIAGNOSIS — F1910 Other psychoactive substance abuse, uncomplicated: Secondary | ICD-10-CM | POA: Diagnosis not present

## 2022-12-19 DIAGNOSIS — N179 Acute kidney failure, unspecified: Secondary | ICD-10-CM | POA: Diagnosis not present

## 2022-12-19 DIAGNOSIS — R7881 Bacteremia: Secondary | ICD-10-CM | POA: Diagnosis not present

## 2022-12-19 DIAGNOSIS — F319 Bipolar disorder, unspecified: Secondary | ICD-10-CM | POA: Diagnosis not present

## 2022-12-19 LAB — BASIC METABOLIC PANEL
Anion gap: 13 (ref 5–15)
BUN: 72 mg/dL — ABNORMAL HIGH (ref 6–20)
CO2: 24 mmol/L (ref 22–32)
Calcium: 7.4 mg/dL — ABNORMAL LOW (ref 8.9–10.3)
Chloride: 100 mmol/L (ref 98–111)
Creatinine, Ser: 5.58 mg/dL — ABNORMAL HIGH (ref 0.61–1.24)
GFR, Estimated: 12 mL/min — ABNORMAL LOW (ref >60)
Glucose, Bld: 107 mg/dL — ABNORMAL HIGH (ref 70–99)
Potassium: 3.8 mmol/L (ref 3.5–5.1)
Sodium: 137 mmol/L (ref 135–145)

## 2022-12-19 LAB — CBC
HCT: 22.7 % — ABNORMAL LOW (ref 39.0–52.0)
Hemoglobin: 7.4 g/dL — ABNORMAL LOW (ref 13.0–17.0)
MCH: 28.4 pg (ref 26.0–34.0)
MCHC: 32.6 g/dL (ref 30.0–36.0)
MCV: 87 fL (ref 80.0–100.0)
Platelets: 294 10*3/uL (ref 150–400)
RBC: 2.61 MIL/uL — ABNORMAL LOW (ref 4.22–5.81)
RDW: 13.7 % (ref 11.5–15.5)
WBC: 6.9 10*3/uL (ref 4.0–10.5)
nRBC: 0 % (ref 0.0–0.2)

## 2022-12-19 LAB — ANCA TITERS
Atypical P-ANCA titer: <1:20 {titer}
C-ANCA: <1:20 {titer}
P-ANCA: <1:20 {titer}

## 2022-12-19 LAB — C3 COMPLEMENT: C3 Complement: 153 mg/dL (ref 82–167)

## 2022-12-19 LAB — ANTISTREPTOLYSIN O TITER: ASO: 150 IU/mL (ref 0.0–200.0)

## 2022-12-19 LAB — C4 COMPLEMENT: Complement C4, Body Fluid: 34 mg/dL (ref 12–38)

## 2022-12-19 LAB — ANA W/REFLEX IF POSITIVE: Anti Nuclear Antibody (ANA): NEGATIVE

## 2022-12-19 LAB — GLOMERULAR BASEMENT MEMBRANE ANTIBODIES: GBM Ab: <0.2 units (ref 0.0–0.9)

## 2022-12-19 NOTE — Plan of Care (Signed)
  Problem: Fluid Volume: Goal: Hemodynamic stability will improve Outcome: Progressing   Problem: Clinical Measurements: Goal: Diagnostic test results will improve Outcome: Progressing Goal: Signs and symptoms of infection will decrease Outcome: Progressing   Problem: Respiratory: Goal: Ability to maintain adequate ventilation will improve Outcome: Progressing   Problem: Education: Goal: Knowledge of General Education information will improve Description: Including pain rating scale, medication(s)/side effects and non-pharmacologic comfort measures Outcome: Progressing   Problem: Health Behavior/Discharge Planning: Goal: Ability to manage health-related needs will improve Outcome: Progressing   Problem: Clinical Measurements: Goal: Ability to maintain clinical measurements within normal limits will improve Outcome: Progressing Goal: Will remain free from infection Outcome: Progressing Goal: Diagnostic test results will improve Outcome: Progressing Goal: Respiratory complications will improve Outcome: Progressing Goal: Cardiovascular complication will be avoided Outcome: Progressing   Problem: Activity: Goal: Risk for activity intolerance will decrease Outcome: Progressing   Problem: Nutrition: Goal: Adequate nutrition will be maintained Outcome: Progressing   Problem: Coping: Goal: Level of anxiety will decrease Outcome: Progressing   Problem: Elimination: Goal: Will not experience complications related to bowel motility Outcome: Progressing Goal: Will not experience complications related to urinary retention Outcome: Progressing   Problem: Pain Managment: Goal: General experience of comfort will improve Outcome: Progressing   Problem: Safety: Goal: Ability to remain free from injury will improve Outcome: Progressing   Problem: Skin Integrity: Goal: Risk for impaired skin integrity will decrease Outcome: Progressing   Problem: Education: Goal:  Knowledge of the prescribed therapeutic regimen will improve Outcome: Progressing Goal: Ability to verbalize activity precautions or restrictions will improve Outcome: Progressing Goal: Understanding of discharge needs will improve Outcome: Progressing   Problem: Activity: Goal: Ability to perform//tolerate increased activity and mobilize with assistive devices will improve Outcome: Progressing   Problem: Clinical Measurements: Goal: Postoperative complications will be avoided or minimized Outcome: Progressing   Problem: Self-Care: Goal: Ability to meet self-care needs will improve Outcome: Progressing   Problem: Self-Concept: Goal: Ability to maintain and perform role responsibilities to the fullest extent possible will improve Outcome: Progressing   Problem: Pain Management: Goal: Pain level will decrease with appropriate interventions Outcome: Progressing   Problem: Skin Integrity: Goal: Demonstration of wound healing without infection will improve Outcome: Progressing

## 2022-12-19 NOTE — Progress Notes (Signed)
[facility] Progress Note   Assessment/ Plan:   1. AKI: recurrent, d/t obstructive uropathy. Good UOP.  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  May end up considering biopsy, however doubt would change management so will not pursue today.  - started HD 12/26 for progressive azotema  - assess daily labs/vol for ongoing HD needs  2.  Hyperkalemia:  has improved with medical management and HD  - cont to renal diet  -stop lokelma  3.  MRSA bacteremia  - on daptomycin  4.  Anemia:  - being transfused PRN per primary  5.  Dispo: admitted  Subjective:    Seen in room - no issues with 1st dialysis yest; no new issues reported.   UOP 2.85L yest   Objective:   BP 109/73 (BP Location: Left Arm)   Pulse 86   Temp 97.8 F (36.6 C) (Oral)   Resp 16   Ht 6\' 2"  (1.88 m)   Wt 134.1 kg   SpO2 97%   BMI 37.96 kg/m   Intake/Output Summary (Last 24 hours) at 12/19/2022 1447 Last data filed at 12/19/2022 1358 Gross per 24 hour  Intake 2069.25 ml  Output 2750 ml  Net -680.75 ml    Weight change:   Physical Exam: RJJ:OACZYSA chronically ill  HEENT: MMM CVS: RRR. No rub Resp: clear Abd: soft Ext: 1+ trace edema GU: tea-colored urine  Imaging: IR Fluoro Guide CV Line Right  Result Date: 12/18/2022 CLINICAL DATA:  Renal failure, needs venous access for hemodialysis EXAM: EXAM RIGHT IJ CATHETER PLACEMENT UNDER ULTRASOUND AND FLUOROSCOPIC GUIDANCE TECHNIQUE: The procedure, risks (including but not limited to bleeding, infection, organ damage, pneumothorax), benefits, and alternatives were explained to the patient. Questions regarding the procedure were encouraged and answered. The patient understands and consents to the procedure. Patency of the right IJ vein was confirmed with ultrasound with image documentation. An appropriate skin site was determined. Skin site was marked. Region  was prepped using maximum barrier technique including cap and mask, sterile gown, sterile gloves, large sterile sheet, and Chlorhexidine as cutaneous antisepsis. The region was infiltrated locally with 1% lidocaine. Under real-time ultrasound guidance, the right IJ vein was accessed with a 19 gauge needle; the needle tip within the vein was confirmed with ultrasound image documentation. The needle exchanged over a guidewire for vascular dilator which allowed advancement of a 16 cm Trialysis catheter. This was positioned with the tip at the cavoatrial junction. Spot chest radiograph shows good positioning and no pneumothorax. Catheter was flushed and sutured externally with 0-Prolene sutures. Patient tolerated the procedure well. FLUOROSCOPY: Radiation Exposure Index (as provided by the fluoroscopic device): 2 mGy air Kerma COMPLICATIONS: COMPLICATIONS none IMPRESSION: 1. Technically successful right IJ Trialysis catheter placement. Electronically Signed   By: Lucrezia Europe M.D.   On: 12/18/2022 14:41   IR US Guide Vasc Access Right  Result Date: 12/18/2022 CLINICAL DATA:  Renal failure, needs venous access for hemodialysis EXAM: EXAM RIGHT IJ CATHETER PLACEMENT UNDER ULTRASOUND AND FLUOROSCOPIC GUIDANCE TECHNIQUE: The procedure, risks (including but not limited to bleeding, infection, organ damage, pneumothorax), benefits, and alternatives were explained to the patient. Questions regarding the procedure were encouraged and answered. The patient understands and consents to the procedure. Patency of the right IJ vein was confirmed with ultrasound with image documentation. An appropriate skin site was determined. Skin site was marked. Region was prepped using maximum barrier  technique including cap and mask, sterile gown, sterile gloves, large sterile sheet, and Chlorhexidine as cutaneous antisepsis. The region was infiltrated locally with 1% lidocaine. Under real-time ultrasound guidance, the right IJ vein was  accessed with a 19 gauge needle; the needle tip within the vein was confirmed with ultrasound image documentation. The needle exchanged over a guidewire for vascular dilator which allowed advancement of a 16 cm Trialysis catheter. This was positioned with the tip at the cavoatrial junction. Spot chest radiograph shows good positioning and no pneumothorax. Catheter was flushed and sutured externally with 0-Prolene sutures. Patient tolerated the procedure well. FLUOROSCOPY: Radiation Exposure Index (as provided by the fluoroscopic device): 2 mGy air Kerma COMPLICATIONS: COMPLICATIONS none IMPRESSION: 1. Technically successful right IJ Trialysis catheter placement. Electronically Signed   By: Lucrezia Europe M.D.   On: 12/18/2022 14:41    Labs: BMET Recent Labs  Lab 12/13/22 0431 12/14/22 0418 12/15/22 0505 12/15/22 0509 12/15/22 1627 12/16/22 0337 12/16/22 0640 12/16/22 1846 12/16/22 2200 12/17/22 1022 12/17/22 1807 12/17/22 2315 12/18/22 0626 12/19/22 0118  NA 137 133*   < > 128*   < > 127*   < > 129* 132* 128* 131* 133* 135 137  K 4.2 5.1   < > 5.8*   < > 7.0*   < > 6.3* 5.8* 5.6* 5.1 4.8 5.0 3.8  CL 106 101   < > 97*   < > 95*   < > 96* 97* 94* 94* 96* 98 100  CO2 23 22   < > 19*   < > 16*   < > 19* 21* 21* 23 22 22 24   GLUCOSE 94 103*   < > 96   < > 91   < > 90 101* 108* 102* 109* 100* 107*  BUN 39* 51*   < > 76*   < > 83*   < > 90* 84* 96* 98* 99* 101* 72*  CREATININE 1.91* 3.46*   < > 5.17*   < > 5.86*   < > 6.18* 6.48* 7.03* 7.27* 7.07* 7.22* 5.58*  CALCIUM 7.9* 8.3*   < > 7.7*   < > 7.9*   < > 7.7* 7.5* 7.2* 7.3* 7.2* 7.3* 7.4*  PHOS 5.6* 5.9*  --  7.5*  --  7.3*  --   --   --   --   --   --   --   --    < > = values in this interval not displayed.    CBC Recent Labs  Lab 12/16/22 1344 12/17/22 1022 12/18/22 0626 12/19/22 0118  WBC 8.2 6.8 7.2 6.9  HGB 6.5* 6.3* 7.6* 7.4*  HCT 19.0* 18.8* 23.2* 22.7*  MCV 85.6 85.8 86.9 87.0  PLT 301 282 281 294     Medications:      buprenorphine-naloxone  1 tablet Sublingual BID   Chlorhexidine Gluconate Cloth  6 each Topical Q0600   heparin injection (subcutaneous)  5,000 Units Subcutaneous Q8H   nutrition supplement (JUVEN)  1 packet Oral BID BM   pantoprazole  40 mg Oral Daily   polyethylene glycol  17 g Oral Daily   sertraline  25 mg Oral Daily   tamsulosin  0.4 mg Oral QPC supper   thiamine  500 mg Oral Daily    Jannifer Hick MD [facility] Pager [phone]

## 2022-12-19 NOTE — Progress Notes (Signed)
Physical Therapy Treatment Patient Details Name: Hunter Reyes MRN: [id number] DOB: Jan 07, 1976 Today's Date: 12/19/2022   History of Present Illness 46 y.o. male admitted 12/10 with AMS, urinary retention and low back pain. Underwent L4 lumbar laminectomy for epidural abscess 12/13. s/p R foot transmet amp 12/17. Pt initiated HD on 12/26. PMH:  bipolar 1 disorder, borderline personality disorder, COPD, crack cocaine abuse, depression, peripheral neuropathy, history of right foot osteomyelitis, panic attack and history of seizures    PT Comments    Planned to work on transfers including lateral scoot and attempted standing but limited by repeated need for BM. Continue to recommend SNF at DC.    Recommendations for follow up therapy are one component of a multi-disciplinary discharge planning process, led by the attending physician.  Recommendations may be updated based on patient status, additional functional criteria and insurance authorization.  Follow Up Recommendations  Skilled nursing-short term rehab (<3 hours/day) Can patient physically be transported by private vehicle: No   Assistance Recommended at Discharge Frequent or constant Supervision/Assistance  Patient can return home with the following Assistance with cooking/housework;Assist for transportation;Help with stairs or ramp for entrance;A lot of help with walking and/or transfers;A little help with bathing/dressing/bathroom   Equipment Recommendations  Rolling walker (2 wheels);BSC/3in1;Wheelchair (measurements PT)    Recommendations for Other Services       Precautions / Restrictions Precautions Precautions: Fall;Back Precaution Comments: watch HR, incontinent stool Required Braces or Orthoses: Other Brace Other Brace: post op shoe Restrictions Weight Bearing Restrictions: Yes RLE Weight Bearing: Non weight bearing Other Position/Activity Restrictions: don darco shoe     Mobility  Bed Mobility Overal bed  mobility: Needs Assistance Bed Mobility: Rolling, Sidelying to Sit, Sit to Sidelying Rolling: Min guard Sidelying to sit: Mod assist, HOB elevated     Sit to sidelying: Min assist General bed mobility comments: Assist to bring legs off of bed and elevate trunk into siting. Assist to bring legs back up into bed.    Transfers Overall transfer level: Needs assistance Equipment used: None              Lateral/Scoot Transfers: Min assist, +2 safety/equipment General transfer comment: Lateral scoot up side of bed with verbal/tactile cues for sequencing. Unable to scoot to recliner due to need to have BM.    Ambulation/Gait                   Stairs             Wheelchair Mobility    Modified Rankin (Stroke Patients Only)       Balance Overall balance assessment: Needs assistance Sitting-balance support: No upper extremity supported, Feet supported Sitting balance-[name] Scale: Fair                                      Cognition Arousal/Alertness: Awake/alert Behavior During Therapy: WFL for tasks assessed/performed Overall Cognitive Status: No family/caregiver present to determine baseline cognitive functioning Area of Impairment: Problem solving, Awareness, Safety/judgement, Following commands, Memory, Attention, Orientation                 Orientation Level: Disoriented to, Situation Current Attention Level: Sustained Memory: Decreased recall of precautions, Decreased short-term memory Following Commands: Follows one step commands with increased time Safety/Judgement: Decreased awareness of safety, Decreased awareness of deficits Awareness: Emergent Problem Solving: Slow processing, Requires verbal cues, Requires tactile cues General Comments: Pt  didn't remember that he had started HD yesterday.        Exercises      General Comments        Pertinent Vitals/Pain Pain Assessment Pain Assessment: Faces Faces Pain Scale:  Hurts little more Pain Location: back Pain Descriptors / Indicators: Grimacing, Guarding Pain Intervention(s): Limited activity within patient's tolerance, Repositioned    Home Living                          Prior Function            PT Goals (current goals can now be found in the care plan section) Progress towards PT goals: Not progressing toward goals - comment    Frequency    Min 3X/week      PT Plan Current plan remains appropriate    Co-evaluation              AM-PAC PT "6 Clicks" Mobility   Outcome Measure  Help needed turning from your back to your side while in a flat bed without using bedrails?: A Little Help needed moving from lying on your back to sitting on the side of a flat bed without using bedrails?: A Lot Help needed moving to and from a bed to a chair (including a wheelchair)?: A Lot Help needed standing up from a chair using your arms (e.g., wheelchair or bedside chair)?: Total Help needed to walk in hospital room?: Total Help needed climbing 3-5 steps with a railing? : Total 6 Click Score: 10    End of Session   Activity Tolerance: Other (comment) (limited by repeated need for BM) Patient left: in bed;with call bell/phone within reach;with bed alarm set Nurse Communication: Mobility status (pt on bedpain) PT Visit Diagnosis: Muscle weakness (generalized) (M62.81);Pain;Difficulty in walking, not elsewhere classified (R26.2) Pain - part of body:  (back)     Time: 5183-3582 PT Time Calculation (min) (ACUTE ONLY): 30 min  Charges:  $Therapeutic Activity: 23-37 mins                     [place] 12/19/2022, 2:19 PM

## 2022-12-19 NOTE — Progress Notes (Addendum)
PROGRESS NOTE        PATIENT DETAILS Name: Hunter Reyes Age: 46 y.o. Sex: male Date of Birth: 1976/10/08 Admit Date: 12/02/2022 Admitting Physician [name], MD SRP:RXYV, Edwinna Areola, MD  Brief Summary: Patient is a 46 y.o.  male with history of bipolar disorder, crack cocaine use, peripheral neuropathy-who was transferred from [facility] for AKI/urinary retention-upon further evaluation-he was found to have MRSA bacteremia with right foot abscess requiring TMA, lumbar discitis/abscess-requiring emergent laminectomy.  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  Significant events: 12/10>> admit to [facility] at [facility]-AKI-urinary retention.  Plans to transfer to [facility]. 12/13>> laminectomy/decompression of ventral lumbar epidural abscess 12/17>> transmetatarsal amputation of right foot 12/21>>voiding trial-foley removed 12/22>>worsening renal function-urinary retention-foley replaced 12/25>>Urine protein creatinine ratio of 7.68-w hematuria-suggestion of new AKI is 2/2 infection GN 12/26>>HD cath placed-HD started   Significant studies: 12/11>> CT renal stone study: Possible discitis L2-L3/L3-L4, moderate bilateral hydronephrosis/hydroureter 12/13>> MRI L-spine: Discitis/osteomyelitis at L3-L4 with 9 mm thick ventral canal phlegmon/abscess which extends from L3 to L4-L5.  Multiple paraspinal abscesses within the psoas musculature. 12/13>> echo: EF 60-65%, no evidence of valvular vegetations. 12/14>> MRI left foot: No osteomyelitis/abscess 12/15>> MRI right foot: Abscess with osteomyelitis of second/third metatarsals.  Significant microbiology data: 12/10>> COVID/influenza PCR: Negative 12/11>> blood culture: MRSA 12/13>> blood culture: MRSA 12/13>> epidural abscess culture: MRSA 12/14>> blood culture: No growth  Procedures: 12/13>> right L4 laminectomy-sublaminar decompression of ventral epidural  abscess. 12/17>> transmetatarsal amputation of right foot 12/26>>Right IJ Trialysis CVC placed by IR  Consults: ID Neurosurgery IR Renal  Subjective: Awake/alert-some pain at the operative site in his back.  Was getting up and working with physical therapy when I saw him earlier this morning.  Objective: Vitals: Blood pressure 109/73, pulse 86, temperature 97.8 F (36.6 C), temperature source Oral, resp. rate 16, height 6\' 2"  (1.88 m), weight 134.1 kg, SpO2 97 %.   Exam: Gen Exam:Alert awake-not in any distress HEENT:atraumatic, normocephalic Chest: B/L clear to auscultation anteriorly CVS:S1S2 regular Abdomen:soft non tender, non distended Extremities:no edema Neurology: Non focal Skin: no rash  Pertinent Labs/Radiology:    Latest Ref Rng & Units 12/19/2022    1:18 AM 12/18/2022    6:26 AM 12/17/2022   10:22 AM  CBC  WBC 4.0 - 10.5 K/uL 6.9  7.2  6.8   Hemoglobin 13.0 - 17.0 g/dL 7.4  7.6  6.3   Hematocrit 39.0 - 52.0 % 22.7  23.2  18.8   Platelets 150 - 400 K/uL 294  281  282     Lab Results  Component Value Date   NA 137 12/19/2022   K 3.8 12/19/2022   CL 100 12/19/2022   CO2 24 12/19/2022      Assessment/Plan: MRSA bacteremia L3-L4 discitis with epidural abscess-s/p laminectomy/decompression on 12/13 Right foot osteomyelitis/abscess-s/p transmetatarsal amputation on 12/17 Overall improved-repeat cultures negative so far Orthopedics/neurosurgery following-able to lift both lower extremities off the bed. Continue to work with PT/OT ID recommending 8 weeks of IV Daptomycin-end date 02/03/2023, repeat MRI L spine on 01/02/23- and to reconsult ID around 1/10 to revisit  antibiotic plan following repeat imaging Given history of drug use-unfortunately will remain inpatient to complete IV antibiotic duration.  Not a candidate for home therapies.   AKI initially from obstructive uropathy-but recurrent AKI likely due to infectious  glomerulonephritis Urinary  retention-Foley removed on 12/21-Foley catheter reinserted 12/22 due to urinary retention/worsening renal function Initial AKI was felt to be from obstructive uropathy.  Renal function had markedly improved following Foley catheter insertion.  Foley removed 12/21-creatinine worsened-Foley reinserted 12/22 due to recurrent retention. Worsening of AKI recently is now thought to be from infectious glomerulonephritis (significant proteinuria/hematuria)-temporary HD catheter placed by IR on 12/26-underwent HD x 1 on 12/26. Good urine output overnight-will watch for signs of renal recovery. Appreciate nephrology input. Will plan to leave Foley in place until seen by Urology follow up at some point in time  Normocytic anemia Due to worsening AKI No evidence of blood loss S/p 1 unit of PRBC on 12/25 Continue to follow CBC closely and transfuse if Hb<  Peripheral neuropathy No longer on Neurontin-given worsening AKI.  Bipolar 1 disorder Stable Continue zoloft  Polysubstance abuse History of cocaine use Remains on Suboxone  Chronic hepatitis B infection defer further to infectious disease  Dental caries Outpatient dentistry follow-up  Morbid Obesity: Estimated body mass index is 37.96 kg/m as calculated from the following:   Height as of this encounter: 6\' 2"  (1.88 m).   Weight as of this encounter: 134.1 kg.   Code status:   Code Status: Full Code   DVT Prophylaxis: heparin injection 5,000 Units Start: 12/16/22 1000 SCD's Start: 12/09/22 1137   Family Communication: None at bedside   Disposition Plan: Status is: Inpatient Remains inpatient appropriate because: Severity of illness   Planned Discharge Destination:Home ultimately-but will need to remain inpatient to complete IV daptomycin-end date 02/04/20   Diet: Diet Order             Diet renal with fluid restriction Fluid restriction: 1200 mL Fluid; Room service appropriate? Yes; Fluid consistency: Thin  Diet  effective now                     Antimicrobial agents: Anti-infectives (From admission, onward)    Start     Dose/Rate Route Frequency Ordered Stop   12/16/22 2000  DAPTOmycin (CUBICIN) 850 mg in sodium chloride 0.9 % IVPB        8 mg/kg  108.9 kg (Adjusted) 134 mL/hr over 30 Minutes Intravenous Every 48 hours 12/15/22 1145 02/04/23 1959   12/05/22 1800  DAPTOmycin (CUBICIN) 850 mg in sodium chloride 0.9 % IVPB  Status:  Discontinued        8 mg/kg  108.9 kg (Adjusted) 134 mL/hr over 30 Minutes Intravenous Daily 12/05/22 1654 12/15/22 1145   12/05/22 1200  DAPTOmycin (CUBICIN) 850 mg in sodium chloride 0.9 % IVPB  Status:  Discontinued        8 mg/kg  108.9 kg (Adjusted) 134 mL/hr over 30 Minutes Intravenous Daily 12/05/22 0923 12/05/22 1654   12/05/22 0800  vancomycin (VANCOREADY) IVPB 2000 mg/400 mL  Status:  Discontinued        2,000 mg 200 mL/hr over 120 Minutes Intravenous Every 24 hours 12/05/22 0632 12/05/22 0923   12/04/22 2200  ceFEPIme (MAXIPIME) 2 g in sodium chloride 0.9 % 100 mL IVPB  Status:  Discontinued        2 g 200 mL/hr over 30 Minutes Intravenous Every 12 hours 12/04/22 1242 12/05/22 0237   12/04/22 0600  ceFEPIme (MAXIPIME) 1 g in sodium chloride 0.9 % 100 mL IVPB  Status:  Discontinued        1 g 200 mL/hr over 30 Minutes Intravenous Every 24 hours 12/03/22 0508 12/04/22 1242  12/03/22 1100  ceFEPIme (MAXIPIME) 2 g in sodium chloride 0.9 % 100 mL IVPB        2 g 200 mL/hr over 30 Minutes Intravenous  Once 12/03/22 0418 12/03/22 1120   12/03/22 0508  vancomycin variable dose per unstable renal function (pharmacist dosing)  Status:  Discontinued         Does not apply See admin instructions 12/03/22 0508 12/05/22 0849   12/03/22 0500  metroNIDAZOLE (FLAGYL) IVPB 500 mg  Status:  Discontinued        500 mg 100 mL/hr over 60 Minutes Intravenous Every 12 hours 12/03/22 0418 12/05/22 0237   12/03/22 0430  vancomycin (VANCOCIN) IVPB 1000 mg/200 mL premix   Status:  Discontinued        1,000 mg 200 mL/hr over 60 Minutes Intravenous  Once 12/03/22 0418 12/03/22 0421   12/03/22 0415  vancomycin (VANCOREADY) IVPB 2000 mg/400 mL        2,000 mg 200 mL/hr over 120 Minutes Intravenous  Once 12/03/22 0404 12/03/22 0630   12/03/22 0200  cefTRIAXone (ROCEPHIN) 2 g in sodium chloride 0.9 % 100 mL IVPB        2 g 200 mL/hr over 30 Minutes Intravenous  Once 12/03/22 0153 12/03/22 0238        MEDICATIONS: Scheduled Meds:  buprenorphine-naloxone  1 tablet Sublingual BID   Chlorhexidine Gluconate Cloth  6 each Topical Q0600   heparin injection (subcutaneous)  5,000 Units Subcutaneous Q8H   nutrition supplement (JUVEN)  1 packet Oral BID BM   pantoprazole  40 mg Oral Daily   polyethylene glycol  17 g Oral Daily   sertraline  25 mg Oral Daily   tamsulosin  0.4 mg Oral QPC supper   thiamine  500 mg Oral Daily   Continuous Infusions:  sodium chloride 150 mL/hr at 12/19/22 1412   DAPTOmycin (CUBICIN) 850 mg in sodium chloride 0.9 % IVPB 850 mg (12/18/22 2212)   magnesium sulfate bolus IVPB     PRN Meds:.acetaminophen **OR** acetaminophen, alum & mag hydroxide-simeth, bisacodyl, guaiFENesin-dextromethorphan, HYDROmorphone (DILAUDID) injection, magnesium sulfate bolus IVPB, ondansetron **OR** ondansetron (ZOFRAN) IV, oxyCODONE, phenol, polyethylene glycol, tiZANidine, traZODone   I have personally reviewed following labs and imaging studies  LABORATORY DATA: CBC: Recent Labs  Lab 12/15/22 0509 12/16/22 1344 12/17/22 1022 12/18/22 0626 12/19/22 0118  WBC 11.5* 8.2 6.8 7.2 6.9  HGB 7.0* 6.5* 6.3* 7.6* 7.4*  HCT 20.9* 19.0* 18.8* 23.2* 22.7*  MCV 86.7 85.6 85.8 86.9 87.0  PLT 349 301 282 281 294     Basic Metabolic Panel: Recent Labs  Lab 12/13/22 0431 12/14/22 0418 12/15/22 0505 12/15/22 0509 12/15/22 1627 12/16/22 0337 12/16/22 0640 12/17/22 1022 12/17/22 1807 12/17/22 2315 12/18/22 0626 12/19/22 0118  NA 137 133*   < > 128*    < > 127*   < > 128* 131* 133* 135 137  K 4.2 5.1   < > 5.8*   < > 7.0*   < > 5.6* 5.1 4.8 5.0 3.8  CL 106 101   < > 97*   < > 95*   < > 94* 94* 96* 98 100  CO2 23 22   < > 19*   < > 16*   < > 21* 23 22 22 24   GLUCOSE 94 103*   < > 96   < > 91   < > 108* 102* 109* 100* 107*  BUN 39* 51*   < > 76*   < >  83*   < > 96* 98* 99* 101* 72*  CREATININE 1.91* 3.46*   < > 5.17*   < > 5.86*   < > 7.03* 7.27* 7.07* 7.22* 5.58*  CALCIUM 7.9* 8.3*   < > 7.7*   < > 7.9*   < > 7.2* 7.3* 7.2* 7.3* 7.4*  PHOS 5.6* 5.9*  --  7.5*  --  7.3*  --   --   --   --   --   --    < > = values in this interval not displayed.     GFR: Estimated Creatinine Clearance: 24.4 mL/min (A) (by C-G formula based on SCr of 5.58 mg/dL (H)).  Liver Function Tests: Recent Labs  Lab 12/13/22 0431 12/14/22 0418 12/15/22 0509 12/16/22 0337  ALBUMIN 1.8* 1.8* 1.8* 1.8*    No results for input(s): "LIPASE", "AMYLASE" in the last 168 hours. No results for input(s): "AMMONIA" in the last 168 hours.  Coagulation Profile: No results for input(s): "INR", "PROTIME" in the last 168 hours.  Cardiac Enzymes: Recent Labs  Lab 12/13/22 0431  CKTOTAL 84     BNP (last 3 results) No results for input(s): "PROBNP" in the last 8760 hours.  Lipid Profile: No results for input(s): "CHOL", "HDL", "LDLCALC", "TRIG", "CHOLHDL", "LDLDIRECT" in the last 72 hours.  Thyroid Function Tests: No results for input(s): "TSH", "T4TOTAL", "FREET4", "T3FREE", "THYROIDAB" in the last 72 hours.  Anemia Panel: No results for input(s): "VITAMINB12", "FOLATE", "FERRITIN", "TIBC", "IRON", "RETICCTPCT" in the last 72 hours.  Urine analysis:    Component Value Date/Time   COLORURINE AMBER (A) 12/17/2022 1551   APPEARANCEUR TURBID (A) 12/17/2022 1551   LABSPEC 1.010 12/17/2022 1551   PHURINE 6.0 12/17/2022 1551   GLUCOSEU NEGATIVE 12/17/2022 1551   HGBUR MODERATE (A) 12/17/2022 1551   BILIRUBINUR NEGATIVE 12/17/2022 1551   KETONESUR NEGATIVE  12/17/2022 1551   PROTEINUR 100 (A) 12/17/2022 1551   UROBILINOGEN 0.2 04/30/2013 1416   NITRITE POSITIVE (A) 12/17/2022 1551   LEUKOCYTESUR MODERATE (A) 12/17/2022 1551    Sepsis Labs: Lactic Acid, Venous    Component Value Date/Time   LATICACIDVEN 1.2 12/03/2022 0007    MICROBIOLOGY: No results found for this or any previous visit (from the past 240 hour(s)).   RADIOLOGY STUDIES/RESULTS: IR Fluoro Guide CV Line Right  Result Date: 12/18/2022 CLINICAL DATA:  Renal failure, needs venous access for hemodialysis EXAM: EXAM RIGHT IJ CATHETER PLACEMENT UNDER ULTRASOUND AND FLUOROSCOPIC GUIDANCE TECHNIQUE: The procedure, risks (including but not limited to bleeding, infection, organ damage, pneumothorax), benefits, and alternatives were explained to the patient. Questions regarding the procedure were encouraged and answered. The patient understands and consents to the procedure. Patency of the right IJ vein was confirmed with ultrasound with image documentation. An appropriate skin site was determined. Skin site was marked. Region was prepped using maximum barrier technique including cap and mask, sterile gown, sterile gloves, large sterile sheet, and Chlorhexidine as cutaneous antisepsis. The region was infiltrated locally with 1% lidocaine. Under real-time ultrasound guidance, the right IJ vein was accessed with a 19 gauge needle; the needle tip within the vein was confirmed with ultrasound image documentation. The needle exchanged over a guidewire for vascular dilator which allowed advancement of a 16 cm Trialysis catheter. This was positioned with the tip at the cavoatrial junction. Spot chest radiograph shows good positioning and no pneumothorax. Catheter was flushed and sutured externally with 0-Prolene sutures. Patient tolerated the procedure well. FLUOROSCOPY: Radiation Exposure Index (as provided by  the fluoroscopic device): 2 mGy air Kerma COMPLICATIONS: COMPLICATIONS none IMPRESSION: 1.  Technically successful right IJ Trialysis catheter placement. Electronically Signed   By: Lucrezia Europe M.D.   On: 12/18/2022 14:41   IR US Guide Vasc Access Right  Result Date: 12/18/2022 CLINICAL DATA:  Renal failure, needs venous access for hemodialysis EXAM: EXAM RIGHT IJ CATHETER PLACEMENT UNDER ULTRASOUND AND FLUOROSCOPIC GUIDANCE TECHNIQUE: The procedure, risks (including but not limited to bleeding, infection, organ damage, pneumothorax), benefits, and alternatives were explained to the patient. Questions regarding the procedure were encouraged and answered. The patient understands and consents to the procedure. Patency of the right IJ vein was confirmed with ultrasound with image documentation. An appropriate skin site was determined. Skin site was marked. Region was prepped using maximum barrier technique including cap and mask, sterile gown, sterile gloves, large sterile sheet, and Chlorhexidine as cutaneous antisepsis. The region was infiltrated locally with 1% lidocaine. Under real-time ultrasound guidance, the right IJ vein was accessed with a 19 gauge needle; the needle tip within the vein was confirmed with ultrasound image documentation. The needle exchanged over a guidewire for vascular dilator which allowed advancement of a 16 cm Trialysis catheter. This was positioned with the tip at the cavoatrial junction. Spot chest radiograph shows good positioning and no pneumothorax. Catheter was flushed and sutured externally with 0-Prolene sutures. Patient tolerated the procedure well. FLUOROSCOPY: Radiation Exposure Index (as provided by the fluoroscopic device): 2 mGy air Kerma COMPLICATIONS: COMPLICATIONS none IMPRESSION: 1. Technically successful right IJ Trialysis catheter placement. Electronically Signed   By: Lucrezia Europe M.D.   On: 12/18/2022 14:41     LOS: 16 days   Oren Binet, MD  [facility]    To contact the attending provider between 7A-7P or the covering provider during  after hours 7P-7A, please log into the web site [email or web address] and access using universal [place] password for that web site. If you do not have the password, please call the hospital operator.  12/19/2022, 3:01 PM

## 2022-12-20 DIAGNOSIS — F319 Bipolar disorder, unspecified: Secondary | ICD-10-CM | POA: Diagnosis not present

## 2022-12-20 DIAGNOSIS — N179 Acute kidney failure, unspecified: Secondary | ICD-10-CM | POA: Diagnosis not present

## 2022-12-20 DIAGNOSIS — F1910 Other psychoactive substance abuse, uncomplicated: Secondary | ICD-10-CM | POA: Diagnosis not present

## 2022-12-20 DIAGNOSIS — R7881 Bacteremia: Secondary | ICD-10-CM | POA: Diagnosis not present

## 2022-12-20 LAB — BASIC METABOLIC PANEL
Anion gap: 13 (ref 5–15)
BUN: 73 mg/dL — ABNORMAL HIGH (ref 6–20)
CO2: 23 mmol/L (ref 22–32)
Calcium: 7.2 mg/dL — ABNORMAL LOW (ref 8.9–10.3)
Chloride: 105 mmol/L (ref 98–111)
Creatinine, Ser: 5.38 mg/dL — ABNORMAL HIGH (ref 0.61–1.24)
GFR, Estimated: 13 mL/min — ABNORMAL LOW (ref >60)
Glucose, Bld: 97 mg/dL (ref 70–99)
Potassium: 3.3 mmol/L — ABNORMAL LOW (ref 3.5–5.1)
Sodium: 141 mmol/L (ref 135–145)

## 2022-12-20 LAB — CK: Total CK: 134 U/L (ref 49–397)

## 2022-12-20 MED ORDER — POTASSIUM CHLORIDE CRYS ER 20 MEQ PO TBCR
20.0000 meq | EXTENDED_RELEASE_TABLET | Freq: Once | ORAL | Status: AC
  Administered 2022-12-20: 20 meq via ORAL
  Filled 2022-12-20: qty 1

## 2022-12-20 NOTE — Progress Notes (Signed)
Pharmacy Antibiotic Note  Hunter Reyes is a 46 y.o. male admitted on 12/02/2022 with disseminated MRSA bacteremia and epidural abscess with discitis/osteomyelitis. Pharmacy has been consulted for Daptomycin dosing. Repeat blood culture on 12/14 no growth. ID team following and planning end date 02/03/23 (8 weeks from 12/09/22).  AKI, Daptomycin regimen adjusted from q24h to Q48h on 12/15/22. Neffs placed 12/26 and hemodialysis done.  Creatinine now improving. Q48 regimen remains appropriate for renal function.  Last dose given 12/26 pm. Weekly CK 134, within normal limits.  Plan: Continue Daptomycin 850 mg (~8 mg/kg adjBW) IV q48hrs. Dose due at 8pm tonight. Weekly CK on Thursdays, next 12/27/22. Follow renal function for any need to modify regimen.  Height: 6\' 2"  (188 cm) Weight: 134.1 kg (295 lb 10.2 oz) IBW/kg (Calculated) : 82.2  Temp (24hrs), Avg:98.2 F (36.8 C), Min:97.8 F (36.6 C), Max:98.6 F (37 C)  Recent Labs  Lab 12/15/22 0509 12/15/22 1627 12/16/22 1344 12/16/22 1846 12/17/22 1022 12/17/22 1807 12/17/22 2315 12/18/22 0626 12/19/22 0118 12/20/22 0447  WBC 11.5*  --  8.2  --  6.8  --   --  7.2 6.9  --   CREATININE 5.17*   < > 6.00*   < > 7.03* 7.27* 7.07* 7.22* 5.58* 5.38*   < > = values in this interval not displayed.    Estimated Creatinine Clearance: 25.3 mL/min (A) (by C-G formula based on SCr of 5.38 mg/dL (H)).    Allergies  Allergen Reactions   Darvocet [Propoxyphene N-Acetaminophen] Hives and Itching   Tramadol Hives and Itching   Other Hives    Antimicrobials this admission: Ceftriaxone 12/11 x 1 dose Vancomycin 12/11 x 1 dose Cefepime 12/11>>12/12 Metronidazole 12/11>>12/12 Daptomycin 12/13 >>(02/03/23)  CK now QThursday 12/11: 40 12/13: 404 12/14: 201 12/20: 90  12/21: 84 12/28: 134  Microbiology results: 12/03 urine: >100K/ml MRSA and Staph epi 12/10 COVID, flu and RSV: negative 12/11 blood: MRSA 12/11 urine: 20K/ml  MRSA 12/13 blood: MRSA and Staph epi 12/13 abscess(lumbar 4 epidural space): rare MRSA  12/14 blood: negative  Thank you for allowing pharmacy to be a part of this patient's care.  Arty Baumgartner, [place] 12/20/2022 9:56 AM

## 2022-12-20 NOTE — Plan of Care (Signed)
  Problem: Fluid Volume: Goal: Hemodynamic stability will improve Outcome: Progressing   Problem: Clinical Measurements: Goal: Diagnostic test results will improve Outcome: Progressing Goal: Signs and symptoms of infection will decrease Outcome: Progressing   Problem: Respiratory: Goal: Ability to maintain adequate ventilation will improve Outcome: Progressing   Problem: Education: Goal: Knowledge of General Education information will improve Description: Including pain rating scale, medication(s)/side effects and non-pharmacologic comfort measures Outcome: Progressing   Problem: Health Behavior/Discharge Planning: Goal: Ability to manage health-related needs will improve Outcome: Progressing   Problem: Clinical Measurements: Goal: Ability to maintain clinical measurements within normal limits will improve Outcome: Progressing Goal: Will remain free from infection Outcome: Progressing Goal: Diagnostic test results will improve Outcome: Progressing Goal: Respiratory complications will improve Outcome: Progressing Goal: Cardiovascular complication will be avoided Outcome: Progressing   Problem: Activity: Goal: Risk for activity intolerance will decrease Outcome: Progressing   Problem: Nutrition: Goal: Adequate nutrition will be maintained Outcome: Progressing   Problem: Coping: Goal: Level of anxiety will decrease Outcome: Progressing   Problem: Elimination: Goal: Will not experience complications related to bowel motility Outcome: Progressing Goal: Will not experience complications related to urinary retention Outcome: Progressing   Problem: Pain Managment: Goal: General experience of comfort will improve Outcome: Progressing   Problem: Safety: Goal: Ability to remain free from injury will improve Outcome: Progressing   Problem: Skin Integrity: Goal: Risk for impaired skin integrity will decrease Outcome: Progressing   Problem: Education: Goal:  Knowledge of the prescribed therapeutic regimen will improve Outcome: Progressing Goal: Ability to verbalize activity precautions or restrictions will improve Outcome: Progressing Goal: Understanding of discharge needs will improve Outcome: Progressing   Problem: Activity: Goal: Ability to perform//tolerate increased activity and mobilize with assistive devices will improve Outcome: Progressing   Problem: Clinical Measurements: Goal: Postoperative complications will be avoided or minimized Outcome: Progressing   Problem: Self-Care: Goal: Ability to meet self-care needs will improve Outcome: Progressing   Problem: Self-Concept: Goal: Ability to maintain and perform role responsibilities to the fullest extent possible will improve Outcome: Progressing   Problem: Pain Management: Goal: Pain level will decrease with appropriate interventions Outcome: Progressing   Problem: Skin Integrity: Goal: Demonstration of wound healing without infection will improve Outcome: Progressing

## 2022-12-20 NOTE — Progress Notes (Signed)
PROGRESS NOTE        PATIENT DETAILS Name: Hunter Reyes Age: 46 y.o. Sex: male Date of Birth: [birth year] Admit Date: 12/02/2022 Admitting Physician [name], MD PPJ:KDTO, Edwinna Areola, MD  Brief Summary: Patient is a 46 y.o.  male with history of bipolar disorder, crack cocaine use, peripheral neuropathy-who was transferred from [facility] for AKI/urinary retention-upon further evaluation-he was found to have MRSA bacteremia with right foot abscess requiring TMA, lumbar discitis/abscess-requiring emergent laminectomy.  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  Significant events: 12/10>> admit to [facility] at [facility]-AKI-urinary retention.  Plans to transfer to [facility] 12/13>> laminectomy/decompression of ventral lumbar epidural abscess 12/17>> transmetatarsal amputation of right foot 12/21>>voiding trial-foley removed 12/22>>worsening renal function-urinary retention-foley replaced 12/25>>Urine protein creatinine ratio of 7.68-w hematuria-suggestion of new AKI is 2/2 infection GN 12/26>>Temp HD cath placed by IR-HD started  Significant studies: 12/11>> CT renal stone study: Possible discitis L2-L3/L3-L4, moderate bilateral hydronephrosis/hydroureter 12/13>> MRI L-spine: Discitis/osteomyelitis at L3-L4 with 9 mm thick ventral canal phlegmon/abscess which extends from L3 to L4-L5.  Multiple paraspinal abscesses within the psoas musculature. 12/13>> echo: EF 60-65%, no evidence of valvular vegetations. 12/14>> MRI left foot: No osteomyelitis/abscess 12/15>> MRI right foot: Abscess with osteomyelitis of second/third metatarsals. 12/25>>Urine protein creatinine ratio of 7.68 12/25>> ASO titer: Negative 12/25>> ANA: Negative 12/25>> ANCA titers: Negative 12/25>> GBM antibody: Negative 12/25>> C3/C4: Normal limits. 12/25>> HBV/HCV serology:   Significant microbiology data: 12/10>> COVID/influenza PCR:  Negative 12/11>> blood culture: MRSA 12/13>> blood culture: MRSA 12/13>> epidural abscess culture: MRSA 12/14>> blood culture: No growth  Procedures: 12/13>> right L4 laminectomy-sublaminar decompression of ventral epidural abscess. 12/17>> transmetatarsal amputation of right foot 12/26>>Right IJ Trialysis CVC placed by IR  Consults: ID Neurosurgery IR Renal  Subjective: Lying comfortably in bed-has close to 3 L of urine output in Foley bag  Objective: Vitals: Blood pressure (!) 108/58, pulse 85, temperature 97.8 F (36.6 C), temperature source Oral, resp. rate 15, height 6\' 2"  (1.88 m), weight 134.1 kg, SpO2 97 %.   Exam: Gen Exam:Alert awake-not in any distress HEENT:atraumatic, normocephalic Chest: B/L clear to auscultation anteriorly CVS:S1S2 regular Abdomen:soft non tender, non distended Extremities:no edema Neurology: Non focal Skin: no rash  Pertinent Labs/Radiology:    Latest Ref Rng & Units 12/19/2022    1:18 AM 12/18/2022    6:26 AM 12/17/2022   10:22 AM  CBC  WBC 4.0 - 10.5 K/uL 6.9  7.2  6.8   Hemoglobin 13.0 - 17.0 g/dL 7.4  7.6  6.3   Hematocrit 39.0 - 52.0 % 22.7  23.2  18.8   Platelets 150 - 400 K/uL 294  281  282     Lab Results  Component Value Date   NA 141 12/20/2022   K 3.3 (L) 12/20/2022   CL 105 12/20/2022   CO2 23 12/20/2022      Assessment/Plan: MRSA bacteremia L3-L4 discitis with epidural abscess-s/p laminectomy/decompression on 12/13 Right foot osteomyelitis/abscess-s/p transmetatarsal amputation on 12/17 Overall improved-repeat cultures negative so far Orthopedics/neurosurgery following-able to lift both lower extremities off the bed. Continue to work with PT/OT ID recommending 8 weeks of IV Daptomycin-end date 02/03/2023, repeat MRI L spine on 01/02/23- and to reconsult ID around 1/10 to revisit  antibiotic plan following repeat imaging Given history of drug use-unfortunately will remain inpatient to complete IV  antibiotic  duration.  Not a candidate for home therapies.  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   Continue to follow signs for renal recovery.   Will defer biopsy to nephrology service. Plan to keep Foley catheter in for the time being (failed voiding trial)-will need urology follow-up over the course of the next several weeks as AKI issue evolves.  Normocytic anemia Due to worsening AKI No evidence of blood loss S/p 1 unit of PRBC on 12/25 Continue to follow CBC closely and transfuse if Hb<7  Peripheral neuropathy No longer on Neurontin-given worsening AKI.  Bipolar 1 disorder Stable Continue zoloft  Polysubstance abuse History of cocaine use Remains on Suboxone  Dental caries Outpatient dentistry follow-up  Morbid Obesity: Estimated body mass index is 37.96 kg/m as calculated from the following:   Height as of this encounter: 6\' 2"  (1.88 m).   Weight as of this encounter: 134.1 kg.   Code status:   Code Status: Full Code   DVT Prophylaxis: heparin injection 5,000 Units Start: 12/16/22 1000 SCD's Start: 12/09/22 1137   Family Communication: None at bedside   Disposition Plan: Status is: Inpatient Remains inpatient appropriate because: Severity of illness   Planned Discharge Destination:Home ultimately-but will need to remain inpatient to complete IV daptomycin-end date 02/04/20   Diet: Diet Order             Diet renal with fluid restriction Fluid restriction: 1200 mL Fluid; Room service appropriate? Yes; Fluid consistency: Thin  Diet effective now                     Antimicrobial agents: Anti-infectives (From admission, onward)    Start     Dose/Rate Route Frequency Ordered Stop   12/16/22 2000   DAPTOmycin (CUBICIN) 850 mg in sodium chloride 0.9 % IVPB        8 mg/kg  108.9 kg (Adjusted) 134 mL/hr over 30 Minutes Intravenous Every 48 hours 12/15/22 1145 02/04/23 1959   12/05/22 1800  DAPTOmycin (CUBICIN) 850 mg in sodium chloride 0.9 % IVPB  Status:  Discontinued        8 mg/kg  108.9 kg (Adjusted) 134 mL/hr over 30 Minutes Intravenous Daily 12/05/22 1654 12/15/22 1145   12/05/22 1200  DAPTOmycin (CUBICIN) 850 mg in sodium chloride 0.9 % IVPB  Status:  Discontinued        8 mg/kg  108.9 kg (Adjusted) 134 mL/hr over 30 Minutes Intravenous Daily 12/05/22 0923 12/05/22 1654   12/05/22 0800  vancomycin (VANCOREADY) IVPB 2000 mg/400 mL  Status:  Discontinued        2,000 mg 200 mL/hr over 120 Minutes Intravenous Every 24 hours 12/05/22 0632 12/05/22 0923   12/04/22 2200  ceFEPIme (MAXIPIME) 2 g in sodium chloride 0.9 % 100 mL IVPB  Status:  Discontinued        2 g 200 mL/hr over 30 Minutes Intravenous Every 12 hours 12/04/22 1242 12/05/22 0237   12/04/22 0600  ceFEPIme (MAXIPIME) 1 g in sodium chloride 0.9 % 100 mL IVPB  Status:  Discontinued        1 g 200 mL/hr over 30 Minutes Intravenous Every 24 hours 12/03/22 0508 12/04/22 1242   12/03/22 1100  ceFEPIme (MAXIPIME) 2 g in sodium chloride 0.9 % 100 mL IVPB        2  g 200 mL/hr over 30 Minutes Intravenous  Once 12/03/22 0418 12/03/22 1120   12/03/22 0508  vancomycin variable dose per unstable renal function (pharmacist dosing)  Status:  Discontinued         Does not apply See admin instructions 12/03/22 0508 12/05/22 0849   12/03/22 0500  metroNIDAZOLE (FLAGYL) IVPB 500 mg  Status:  Discontinued        500 mg 100 mL/hr over 60 Minutes Intravenous Every 12 hours 12/03/22 0418 12/05/22 0237   12/03/22 0430  vancomycin (VANCOCIN) IVPB 1000 mg/200 mL premix  Status:  Discontinued        1,000 mg 200 mL/hr over 60 Minutes Intravenous  Once 12/03/22 0418 12/03/22 0421   12/03/22 0415  vancomycin (VANCOREADY) IVPB 2000 mg/400 mL         2,000 mg 200 mL/hr over 120 Minutes Intravenous  Once 12/03/22 0404 12/03/22 0630   12/03/22 0200  cefTRIAXone (ROCEPHIN) 2 g in sodium chloride 0.9 % 100 mL IVPB        2 g 200 mL/hr over 30 Minutes Intravenous  Once 12/03/22 0153 12/03/22 0238        MEDICATIONS: Scheduled Meds:  buprenorphine-naloxone  1 tablet Sublingual BID   Chlorhexidine Gluconate Cloth  6 each Topical Q0600   heparin injection (subcutaneous)  5,000 Units Subcutaneous Q8H   nutrition supplement (JUVEN)  1 packet Oral BID BM   pantoprazole  40 mg Oral Daily   polyethylene glycol  17 g Oral Daily   sertraline  25 mg Oral Daily   tamsulosin  0.4 mg Oral QPC supper   thiamine  500 mg Oral Daily   Continuous Infusions:  sodium chloride 150 mL/hr at 12/20/22 0901   DAPTOmycin (CUBICIN) 850 mg in sodium chloride 0.9 % IVPB Stopped (12/18/22 2242)   magnesium sulfate bolus IVPB     PRN Meds:.acetaminophen **OR** acetaminophen, alum & mag hydroxide-simeth, bisacodyl, guaiFENesin-dextromethorphan, HYDROmorphone (DILAUDID) injection, magnesium sulfate bolus IVPB, ondansetron **OR** ondansetron (ZOFRAN) IV, oxyCODONE, phenol, polyethylene glycol, tiZANidine, traZODone   I have personally reviewed following labs and imaging studies  LABORATORY DATA: CBC: Recent Labs  Lab 12/15/22 0509 12/16/22 1344 12/17/22 1022 12/18/22 0626 12/19/22 0118  WBC 11.5* 8.2 6.8 7.2 6.9  HGB 7.0* 6.5* 6.3* 7.6* 7.4*  HCT 20.9* 19.0* 18.8* 23.2* 22.7*  MCV 86.7 85.6 85.8 86.9 87.0  PLT 349 301 282 281 294     Basic Metabolic Panel: Recent Labs  Lab 12/14/22 0418 12/15/22 0505 12/15/22 0509 12/15/22 1627 12/16/22 0337 12/16/22 0640 12/17/22 1807 12/17/22 2315 12/18/22 0626 12/19/22 0118 12/20/22 0447  NA 133*   < > 128*   < > 127*   < > 131* 133* 135 137 141  K 5.1   < > 5.8*   < > 7.0*   < > 5.1 4.8 5.0 3.8 3.3*  CL 101   < > 97*   < > 95*   < > 94* 96* 98 100 105  CO2 22   < > 19*   < > 16*   < > 23 22 22 24  23   GLUCOSE 103*   < > 96   < > 91   < > 102* 109* 100* 107* 97  BUN 51*   < > 76*   < > 83*   < > 98* 99* 101* 72* 73*  CREATININE 3.46*   < > 5.17*   < > 5.86*   < > 7.27* 7.07* 7.22* 5.58* 5.38*  CALCIUM 8.3*   < > 7.7*   < > 7.9*   < > 7.3* 7.2* 7.3* 7.4* 7.2*  PHOS 5.9*  --  7.5*  --  7.3*  --   --   --   --   --   --    < > = values in this interval not displayed.     GFR: Estimated Creatinine Clearance: 25.3 mL/min (A) (by C-G formula based on SCr of 5.38 mg/dL (H)).  Liver Function Tests: Recent Labs  Lab 12/14/22 0418 12/15/22 0509 12/16/22 0337  ALBUMIN 1.8* 1.8* 1.8*    No results for input(s): "LIPASE", "AMYLASE" in the last 168 hours. No results for input(s): "AMMONIA" in the last 168 hours.  Coagulation Profile: No results for input(s): "INR", "PROTIME" in the last 168 hours.  Cardiac Enzymes: Recent Labs  Lab 12/20/22 0447  CKTOTAL 134     BNP (last 3 results) No results for input(s): "PROBNP" in the last 8760 hours.  Lipid Profile: No results for input(s): "CHOL", "HDL", "LDLCALC", "TRIG", "CHOLHDL", "LDLDIRECT" in the last 72 hours.  Thyroid Function Tests: No results for input(s): "TSH", "T4TOTAL", "FREET4", "T3FREE", "THYROIDAB" in the last 72 hours.  Anemia Panel: No results for input(s): "VITAMINB12", "FOLATE", "FERRITIN", "TIBC", "IRON", "RETICCTPCT" in the last 72 hours.  Urine analysis:    Component Value Date/Time   COLORURINE AMBER (A) 12/17/2022 1551   APPEARANCEUR TURBID (A) 12/17/2022 1551   LABSPEC 1.010 12/17/2022 1551   PHURINE 6.0 12/17/2022 1551   GLUCOSEU NEGATIVE 12/17/2022 1551   HGBUR MODERATE (A) 12/17/2022 1551   BILIRUBINUR NEGATIVE 12/17/2022 1551   KETONESUR NEGATIVE 12/17/2022 1551   PROTEINUR 100 (A) 12/17/2022 1551   UROBILINOGEN 0.2 04/30/2013 1416   NITRITE POSITIVE (A) 12/17/2022 1551   LEUKOCYTESUR MODERATE (A) 12/17/2022 1551    Sepsis Labs: Lactic Acid, Venous    Component Value Date/Time    LATICACIDVEN 1.2 12/03/2022 0007    MICROBIOLOGY: No results found for this or any previous visit (from the past 240 hour(s)).   RADIOLOGY STUDIES/RESULTS: IR Fluoro Guide CV Line Right  Result Date: 12/18/2022 CLINICAL DATA:  Renal failure, needs venous access for hemodialysis EXAM: EXAM RIGHT IJ CATHETER PLACEMENT UNDER ULTRASOUND AND FLUOROSCOPIC GUIDANCE TECHNIQUE: The procedure, risks (including but not limited to bleeding, infection, organ damage, pneumothorax), benefits, and alternatives were explained to the patient. Questions regarding the procedure were encouraged and answered. The patient understands and consents to the procedure. Patency of the right IJ vein was confirmed with ultrasound with image documentation. An appropriate skin site was determined. Skin site was marked. Region was prepped using maximum barrier technique including cap and mask, sterile gown, sterile gloves, large sterile sheet, and Chlorhexidine as cutaneous antisepsis. The region was infiltrated locally with 1% lidocaine. Under real-time ultrasound guidance, the right IJ vein was accessed with a 19 gauge needle; the needle tip within the vein was confirmed with ultrasound image documentation. The needle exchanged over a guidewire for vascular dilator which allowed advancement of a 16 cm Trialysis catheter. This was positioned with the tip at the cavoatrial junction. Spot chest radiograph shows good positioning and no pneumothorax. Catheter was flushed and sutured externally with 0-Prolene sutures. Patient tolerated the procedure well. FLUOROSCOPY: Radiation Exposure Index (as provided by the fluoroscopic device): 2 mGy air Kerma COMPLICATIONS: COMPLICATIONS none IMPRESSION: 1. Technically successful right IJ Trialysis catheter placement. Electronically Signed   By: Lucrezia Europe M.D.   On: 12/18/2022 14:41   IR US Guide Vasc  Access Right  Result Date: 12/18/2022 CLINICAL DATA:  Renal failure, needs venous access for  hemodialysis EXAM: EXAM RIGHT IJ CATHETER PLACEMENT UNDER ULTRASOUND AND FLUOROSCOPIC GUIDANCE TECHNIQUE: The procedure, risks (including but not limited to bleeding, infection, organ damage, pneumothorax), benefits, and alternatives were explained to the patient. Questions regarding the procedure were encouraged and answered. The patient understands and consents to the procedure. Patency of the right IJ vein was confirmed with ultrasound with image documentation. An appropriate skin site was determined. Skin site was marked. Region was prepped using maximum barrier technique including cap and mask, sterile gown, sterile gloves, large sterile sheet, and Chlorhexidine as cutaneous antisepsis. The region was infiltrated locally with 1% lidocaine. Under real-time ultrasound guidance, the right IJ vein was accessed with a 19 gauge needle; the needle tip within the vein was confirmed with ultrasound image documentation. The needle exchanged over a guidewire for vascular dilator which allowed advancement of a 16 cm Trialysis catheter. This was positioned with the tip at the cavoatrial junction. Spot chest radiograph shows good positioning and no pneumothorax. Catheter was flushed and sutured externally with 0-Prolene sutures. Patient tolerated the procedure well. FLUOROSCOPY: Radiation Exposure Index (as provided by the fluoroscopic device): 2 mGy air Kerma COMPLICATIONS: COMPLICATIONS none IMPRESSION: 1. Technically successful right IJ Trialysis catheter placement. Electronically Signed   By: Lucrezia Europe M.D.   On: 12/18/2022 14:41     LOS: 17 days   Oren Binet, MD  [facility]    To contact the attending provider between 7A-7P or the covering provider during after hours 7P-7A, please log into the web site [email or web address] and access using universal [place] password for that web site. If you do not have the password, please call the hospital operator.  12/20/2022, 11:47 AM

## 2022-12-20 NOTE — Progress Notes (Signed)
[facility] Progress Note   Assessment/ Plan:   1. AKI: recurrent, d/t obstructive uropathy. Good UOP.  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  May end up considering biopsy, however doubt would change management (would suspect underlying infection related GN; would treat infection and not immunosuppress in setting of disseminated MRSA) so will not pursue today.  - started HD 12/26 for progressive azotema  - assess daily labs/vol for ongoing HD needs - hold today given Cr slightly improved and BUN plateaued in 70s.  Hopefully recovering.  2.  Hyperkalemia:  has improved with medical management and HD  - cont to renal diet  -stop lokelma  3.  Disseminated MRSA>  discitis, osteo multiple locations, bacteremia  - on daptomycin  4.  Anemia:  - being transfused PRN per primary  5.  Dispo: admitted  Subjective:    Seen in room - no new issues.  UOP 3200 today - confirmed with NA this is correct, another 869mL in bag now.   Cr 5.6 > 5.4    Objective:   BP (!) 108/58 (BP Location: Left Arm)   Pulse 85   Temp 97.8 F (36.6 C) (Oral)   Resp 15   Ht 6\' 2"  (1.88 m)   Wt 134.1 kg   SpO2 97%   BMI 37.96 kg/m   Intake/Output Summary (Last 24 hours) at 12/20/2022 1120 Last data filed at 12/20/2022 0835 Gross per 24 hour  Intake 2725.29 ml  Output 3500 ml  Net -774.71 ml    Weight change:   Physical Exam: QMG:[id number] chronically ill  HEENT: MMM, very poor dentition CVS: RRR. No rub Resp: clear Abd: soft Ext: no significant edema GU: tea-colored urine  Imaging: IR Fluoro Guide CV Line Right  Result Date: 12/18/2022 CLINICAL DATA:  Renal failure, needs venous access for hemodialysis EXAM: EXAM RIGHT IJ CATHETER PLACEMENT UNDER ULTRASOUND AND FLUOROSCOPIC GUIDANCE TECHNIQUE: The procedure, risks (including but not limited to bleeding, infection, organ damage, pneumothorax),  benefits, and alternatives were explained to the patient. Questions regarding the procedure were encouraged and answered. The patient understands and consents to the procedure. Patency of the right IJ vein was confirmed with ultrasound with image documentation. An appropriate skin site was determined. Skin site was marked. Region was prepped using maximum barrier technique including cap and mask, sterile gown, sterile gloves, large sterile sheet, and Chlorhexidine as cutaneous antisepsis. The region was infiltrated locally with 1% lidocaine. Under real-time ultrasound guidance, the right IJ vein was accessed with a 19 gauge needle; the needle tip within the vein was confirmed with ultrasound image documentation. The needle exchanged over a guidewire for vascular dilator which allowed advancement of a 16 cm Trialysis catheter. This was positioned with the tip at the cavoatrial junction. Spot chest radiograph shows good positioning and no pneumothorax. Catheter was flushed and sutured externally with 0-Prolene sutures. Patient tolerated the procedure well. FLUOROSCOPY: Radiation Exposure Index (as provided by the fluoroscopic device): 2 mGy air Kerma COMPLICATIONS: COMPLICATIONS none IMPRESSION: 1. Technically successful right IJ Trialysis catheter placement. Electronically Signed   By: Lucrezia Europe M.D.   On: 12/18/2022 14:41   IR US Guide Vasc Access Right  Result Date: 12/18/2022 CLINICAL DATA:  Renal failure, needs venous access for hemodialysis EXAM: EXAM RIGHT IJ CATHETER PLACEMENT UNDER ULTRASOUND AND FLUOROSCOPIC GUIDANCE TECHNIQUE: The procedure, risks (including but not limited to bleeding, infection, organ damage, pneumothorax),  benefits, and alternatives were explained to the patient. Questions regarding the procedure were encouraged and answered. The patient understands and consents to the procedure. Patency of the right IJ vein was confirmed with ultrasound with image documentation. An appropriate  skin site was determined. Skin site was marked. Region was prepped using maximum barrier technique including cap and mask, sterile gown, sterile gloves, large sterile sheet, and Chlorhexidine as cutaneous antisepsis. The region was infiltrated locally with 1% lidocaine. Under real-time ultrasound guidance, the right IJ vein was accessed with a 19 gauge needle; the needle tip within the vein was confirmed with ultrasound image documentation. The needle exchanged over a guidewire for vascular dilator which allowed advancement of a 16 cm Trialysis catheter. This was positioned with the tip at the cavoatrial junction. Spot chest radiograph shows good positioning and no pneumothorax. Catheter was flushed and sutured externally with 0-Prolene sutures. Patient tolerated the procedure well. FLUOROSCOPY: Radiation Exposure Index (as provided by the fluoroscopic device): 2 mGy air Kerma COMPLICATIONS: COMPLICATIONS none IMPRESSION: 1. Technically successful right IJ Trialysis catheter placement. Electronically Signed   By: Lucrezia Europe M.D.   On: 12/18/2022 14:41    Labs: BMET Recent Labs  Lab 12/14/22 0418 12/15/22 0505 12/15/22 0509 12/15/22 1627 12/16/22 0337 12/16/22 0640 12/16/22 2200 12/17/22 1022 12/17/22 1807 12/17/22 2315 12/18/22 0626 12/19/22 0118 12/20/22 0447  NA 133*   < > 128*   < > 127*   < > 132* 128* 131* 133* 135 137 141  K 5.1   < > 5.8*   < > 7.0*   < > 5.8* 5.6* 5.1 4.8 5.0 3.8 3.3*  CL 101   < > 97*   < > 95*   < > 97* 94* 94* 96* 98 100 105  CO2 22   < > 19*   < > 16*   < > 21* 21* 23 22 22 24 23   GLUCOSE 103*   < > 96   < > 91   < > 101* 108* 102* 109* 100* 107* 97  BUN 51*   < > 76*   < > 83*   < > 84* 96* 98* 99* 101* 72* 73*  CREATININE 3.46*   < > 5.17*   < > 5.86*   < > 6.48* 7.03* 7.27* 7.07* 7.22* 5.58* 5.38*  CALCIUM 8.3*   < > 7.7*   < > 7.9*   < > 7.5* 7.2* 7.3* 7.2* 7.3* 7.4* 7.2*  PHOS 5.9*  --  7.5*  --  7.3*  --   --   --   --   --   --   --   --    < > = values  in this interval not displayed.    CBC Recent Labs  Lab 12/16/22 1344 12/17/22 1022 12/18/22 0626 12/19/22 0118  WBC 8.2 6.8 7.2 6.9  HGB 6.5* 6.3* 7.6* 7.4*  HCT 19.0* 18.8* 23.2* 22.7*  MCV 85.6 85.8 86.9 87.0  PLT 301 282 281 294     Medications:     buprenorphine-naloxone  1 tablet Sublingual BID   Chlorhexidine Gluconate Cloth  6 each Topical Q0600   heparin injection (subcutaneous)  5,000 Units Subcutaneous Q8H   nutrition supplement (JUVEN)  1 packet Oral BID BM   pantoprazole  40 mg Oral Daily   polyethylene glycol  17 g Oral Daily   sertraline  25 mg Oral Daily   tamsulosin  0.4 mg Oral QPC supper   thiamine  500 mg Oral Daily    Jannifer Hick MD [facility] Pager [phone]

## 2022-12-20 NOTE — Progress Notes (Signed)
Physical Therapy Treatment Patient Details Name: TAN CLOPPER MRN: [id number] DOB: 08-Apr-1976 Today's Date: 12/20/2022   History of Present Illness 46 y.o. male admitted 12/10 with AMS, urinary retention and low back pain. Underwent L4 lumbar laminectomy for epidural abscess 12/13. s/p R foot transmet amp 12/17. Pt initiated HD on 12/26. PMH:  bipolar 1 disorder, borderline personality disorder, COPD, crack cocaine abuse, depression, peripheral neuropathy, history of right foot osteomyelitis, panic attack and history of seizures    PT Comments    Pt making slow progress with transfers. Working on scoot transfers since pt unable to maintain NWB on rt foot with standing.    Recommendations for follow up therapy are one component of a multi-disciplinary discharge planning process, led by the attending physician.  Recommendations may be updated based on patient status, additional functional criteria and insurance authorization.  Follow Up Recommendations  Skilled nursing-short term rehab (<3 hours/day) Can patient physically be transported by private vehicle: No   Assistance Recommended at Discharge Frequent or constant Supervision/Assistance  Patient can return home with the following Assistance with cooking/housework;Assist for transportation;Help with stairs or ramp for entrance;A lot of help with walking and/or transfers;A little help with bathing/dressing/bathroom   Equipment Recommendations  Rolling walker (2 wheels);Wheelchair (measurements PT);Other (comment) (drop arm commode)    Recommendations for Other Services       Precautions / Restrictions Precautions Precautions: Fall;Back Precaution Comments: watch HR, incontinent stool Required Braces or Orthoses: Other Brace Other Brace: post op shoe Restrictions Weight Bearing Restrictions: Yes RLE Weight Bearing: Non weight bearing     Mobility  Bed Mobility Overal bed mobility: Needs Assistance Bed Mobility: Rolling,  Sidelying to Sit, Sit to Sidelying Rolling: Min guard Sidelying to sit: HOB elevated, Min assist       General bed mobility comments: Assist to elevate trunk into sitting.    Transfers Overall transfer level: Needs assistance Equipment used: None Transfers: Bed to chair/wheelchair/BSC            Lateral/Scoot Transfers: Min assist, +2 safety/equipment General transfer comment: Light assist and multiple verbal cues for technique to scoot bed to chair.    Ambulation/Gait                   Stairs             Wheelchair Mobility    Modified Rankin (Stroke Patients Only)       Balance Overall balance assessment: Needs assistance Sitting-balance support: No upper extremity supported, Feet supported Sitting balance-[name] Scale: Fair                                      Cognition Arousal/Alertness: Awake/alert Behavior During Therapy: WFL for tasks assessed/performed Overall Cognitive Status: No family/caregiver present to determine baseline cognitive functioning Area of Impairment: Problem solving, Awareness, Safety/judgement, Following commands, Memory, Attention, Orientation                 Orientation Level: Disoriented to, Situation Current Attention Level: Sustained Memory: Decreased recall of precautions, Decreased short-term memory Following Commands: Follows one step commands with increased time Safety/Judgement: Decreased awareness of safety, Decreased awareness of deficits Awareness: Emergent Problem Solving: Slow processing, Requires verbal cues, Requires tactile cues          Exercises      General Comments        Pertinent Vitals/Pain Pain Assessment Pain Assessment: Faces Faces  Pain Scale: Hurts little more Pain Location: back Pain Descriptors / Indicators: Grimacing, Guarding Pain Intervention(s): Limited activity within patient's tolerance, Repositioned    Home Living                           Prior Function            PT Goals (current goals can now be found in the care plan section) Progress towards PT goals: Progressing toward goals    Frequency    Min 2X/week      PT Plan Current plan remains appropriate;Frequency needs to be updated    Co-evaluation              AM-PAC PT "6 Clicks" Mobility   Outcome Measure  Help needed turning from your back to your side while in a flat bed without using bedrails?: A Little Help needed moving from lying on your back to sitting on the side of a flat bed without using bedrails?: A Little Help needed moving to and from a bed to a chair (including a wheelchair)?: A Little Help needed standing up from a chair using your arms (e.g., wheelchair or bedside chair)?: Total Help needed to walk in hospital room?: Total Help needed climbing 3-5 steps with a railing? : Total 6 Click Score: 12    End of Session   Activity Tolerance: Patient tolerated treatment well Patient left: with call bell/phone within reach;in chair;with chair alarm set Nurse Communication: Mobility status PT Visit Diagnosis: Muscle weakness (generalized) (M62.81);Pain;Difficulty in walking, not elsewhere classified (R26.2) Pain - part of body:  (back)     Time: 6389-3734 PT Time Calculation (min) (ACUTE ONLY): 14 min  Charges:  $Therapeutic Activity: 8-22 mins                     [place] [facility] [place] 12/20/2022, 10:05 AM

## 2022-12-21 DIAGNOSIS — F319 Bipolar disorder, unspecified: Secondary | ICD-10-CM | POA: Diagnosis not present

## 2022-12-21 DIAGNOSIS — N179 Acute kidney failure, unspecified: Secondary | ICD-10-CM | POA: Diagnosis not present

## 2022-12-21 DIAGNOSIS — R7881 Bacteremia: Secondary | ICD-10-CM | POA: Diagnosis not present

## 2022-12-21 DIAGNOSIS — F1910 Other psychoactive substance abuse, uncomplicated: Secondary | ICD-10-CM | POA: Diagnosis not present

## 2022-12-21 LAB — RENAL FUNCTION PANEL
Albumin: 1.6 g/dL — ABNORMAL LOW (ref 3.5–5.0)
Anion gap: 14 (ref 5–15)
BUN: 69 mg/dL — ABNORMAL HIGH (ref 6–20)
CO2: 22 mmol/L (ref 22–32)
Calcium: 7.6 mg/dL — ABNORMAL LOW (ref 8.9–10.3)
Chloride: 105 mmol/L (ref 98–111)
Creatinine, Ser: 4.95 mg/dL — ABNORMAL HIGH (ref 0.61–1.24)
GFR, Estimated: 14 mL/min — ABNORMAL LOW (ref >60)
Glucose, Bld: 92 mg/dL (ref 70–99)
Phosphorus: 6.3 mg/dL — ABNORMAL HIGH (ref 2.5–4.6)
Potassium: 3.3 mmol/L — ABNORMAL LOW (ref 3.5–5.1)
Sodium: 141 mmol/L (ref 135–145)

## 2022-12-21 LAB — CBC
HCT: 23.8 % — ABNORMAL LOW (ref 39.0–52.0)
Hemoglobin: 7.9 g/dL — ABNORMAL LOW (ref 13.0–17.0)
MCH: 29 pg (ref 26.0–34.0)
MCHC: 33.2 g/dL (ref 30.0–36.0)
MCV: 87.5 fL (ref 80.0–100.0)
Platelets: 347 10*3/uL (ref 150–400)
RBC: 2.72 MIL/uL — ABNORMAL LOW (ref 4.22–5.81)
RDW: 14.1 % (ref 11.5–15.5)
WBC: 8.8 10*3/uL (ref 4.0–10.5)
nRBC: 0 % (ref 0.0–0.2)

## 2022-12-21 LAB — GLUCOSE, CAPILLARY: Glucose-Capillary: 83 mg/dL (ref 70–99)

## 2022-12-21 MED ORDER — POTASSIUM CHLORIDE CRYS ER 20 MEQ PO TBCR
40.0000 meq | EXTENDED_RELEASE_TABLET | Freq: Once | ORAL | Status: AC
  Administered 2022-12-21: 40 meq via ORAL
  Filled 2022-12-21: qty 2

## 2022-12-21 NOTE — Progress Notes (Signed)
[facility] Progress Note   Assessment/ Plan:   1. AKI: recurrent, d/t obstructive uropathy. Good UOP.  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  May end up considering biopsy, however doubt would change management (would suspect underlying infection related GN; would treat infection and not immunosuppress in setting of disseminated MRSA) so will not pursue today especially given that his Cr is improving  - started HD 12/26 for progressive azotema  - assess daily labs/vol for ongoing HD needs - hold today given Cr continues to improve, making more urine. Would recommend maintaining catheter for now  2.  Hyperkalemia:  has improved with medical management and HD  - cont to renal diet  -stop lokelma  -now hypoK, replete PRN  3.  Disseminated MRSA>  discitis, osteo multiple locations, bacteremia  - on daptomycin  4.  Anemia:  - being transfused PRN per primary, avoid IV Fe  5.  Dispo: admitted  Subjective:    Seen in room - no new issues.  Uop 3.8L now.   Objective:   BP 129/66 (BP Location: Left Arm)   Pulse 88   Temp 98.7 F (37.1 C) (Oral)   Resp 18   Ht 6\' 2"  (1.88 m)   Wt 134.1 kg   SpO2 98%   BMI 37.96 kg/m   Intake/Output Summary (Last 24 hours) at 12/21/2022 1254 Last data filed at 12/21/2022 1011 Gross per 24 hour  Intake 1210.48 ml  Output 4000 ml  Net -2789.52 ml   Weight change:   Physical Exam: QIO:NGEXBMW chronically ill  HEENT: MMM, very poor dentition CVS: RRR. No rub Resp: clear Abd: soft Ext: no significant edema GU: tea-colored urine Neuro: awake, alert Dialysis access: RIJ temp HD line c/d/i  Imaging: No results found.  Labs: BMET Recent Labs  Lab 12/15/22 0509 12/15/22 1627 12/16/22 0337 12/16/22 0640 12/17/22 1022 12/17/22 1807 12/17/22 2315 12/18/22 0626 12/19/22 0118 12/20/22 0447 12/21/22 0111  NA 128*   < > 127*   < >  128* 131* 133* 135 137 141 141  K 5.8*   < > 7.0*   < > 5.6* 5.1 4.8 5.0 3.8 3.3* 3.3*  CL 97*   < > 95*   < > 94* 94* 96* 98 100 105 105  CO2 19*   < > 16*   < > 21* 23 22 22 24 23 22   GLUCOSE 96   < > 91   < > 108* 102* 109* 100* 107* 97 92  BUN 76*   < > 83*   < > 96* 98* 99* 101* 72* 73* 69*  CREATININE 5.17*   < > 5.86*   < > 7.03* 7.27* 7.07* 7.22* 5.58* 5.38* 4.95*  CALCIUM 7.7*   < > 7.9*   < > 7.2* 7.3* 7.2* 7.3* 7.4* 7.2* 7.6*  PHOS 7.5*  --  7.3*  --   --   --   --   --   --   --  6.3*   < > = values in this interval not displayed.   CBC Recent Labs  Lab 12/17/22 1022 12/18/22 0626 12/19/22 0118 12/21/22 0111  WBC 6.8 7.2 6.9 8.8  HGB 6.3* 7.6* 7.4* 7.9*  HCT 18.8* 23.2* 22.7* 23.8*  MCV 85.8 86.9 87.0 87.5  PLT 282 281 294 347    Medications:     buprenorphine-naloxone  1 tablet Sublingual  BID   Chlorhexidine Gluconate Cloth  6 each Topical Q0600   heparin injection (subcutaneous)  5,000 Units Subcutaneous Q8H   nutrition supplement (JUVEN)  1 packet Oral BID BM   pantoprazole  40 mg Oral Daily   polyethylene glycol  17 g Oral Daily   sertraline  25 mg Oral Daily   tamsulosin  0.4 mg Oral QPC supper   thiamine  500 mg Oral Daily

## 2022-12-21 NOTE — Progress Notes (Addendum)
PROGRESS NOTE        PATIENT DETAILS Name: Hunter Reyes Age: 46 y.o. Sex: male Date of Birth: 06/02/1976 Admit Date: 12/02/2022 Admitting Physician [name], MD LXB:WIOM, Edwinna Areola, MD  Brief Summary: Patient is a 46 y.o.  male with history of bipolar disorder, crack cocaine use, peripheral neuropathy-who was transferred from [facility] for AKI/urinary retention-upon further evaluation-he was found to have MRSA bacteremia with right foot abscess requiring TMA, lumbar discitis/abscess-requiring emergent laminectomy.  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  Significant events: 12/10>> admit to [facility] at [facility]-AKI-urinary retention.  Plans to transfer to [facility]. 12/13>> laminectomy/decompression of ventral lumbar epidural abscess 12/17>> transmetatarsal amputation of right foot 12/21>>voiding trial-foley removed 12/22>>worsening renal function-urinary retention-foley replaced 12/25>>Urine protein creatinine ratio of 7.68-w hematuria-suggestion of new AKI is 2/2 infection GN 12/26>>Temp HD cath placed by IR-HD started  Significant studies: 12/11>> CT renal stone study: Possible discitis L2-L3/L3-L4, moderate bilateral hydronephrosis/hydroureter 12/13>> MRI L-spine: Discitis/osteomyelitis at L3-L4 with 9 mm thick ventral canal phlegmon/abscess which extends from L3 to L4-L5.  Multiple paraspinal abscesses within the psoas musculature. 12/13>> echo: EF 60-65%, no evidence of valvular vegetations. 12/14>> MRI left foot: No osteomyelitis/abscess 12/15>> MRI right foot: Abscess with osteomyelitis of second/third metatarsals. 12/25>>Urine protein creatinine ratio of 7.68 12/25>> ASO titer: Negative 12/25>> ANA: Negative 12/25>> ANCA titers: Negative 12/25>> GBM antibody: Negative 12/25>> C3/C4: Normal limits. 12/25>> HBV/HCV serology:   Significant microbiology data: 12/10>> COVID/influenza PCR:  Negative 12/11>> blood culture: MRSA 12/13>> blood culture: MRSA 12/13>> epidural abscess culture: MRSA 12/14>> blood culture: No growth  Procedures: 12/13>> right L4 laminectomy-sublaminar decompression of ventral epidural abscess. 12/17>> transmetatarsal amputation of right foot 12/26>>Right IJ Trialysis CVC placed by IR  Consults: ID Neurosurgery IR Renal  Subjective: No major issues overnight.  No chest pain or shortness of breath.  Lying comfortably in bed.  Moving all 4 extremities.  Objective: Vitals: Blood pressure 129/66, pulse 88, temperature 98.7 F (37.1 C), temperature source Oral, resp. rate 18, height 6\' 2"  (1.88 m), weight 134.1 kg, SpO2 98 %.   Exam: Gen Exam:Alert awake-not in any distress HEENT:atraumatic, normocephalic Chest: B/L clear to auscultation anteriorly CVS:S1S2 regular Abdomen:soft non tender, non distended Extremities:no edema Neurology: Non focal Skin: no rash  Pertinent Labs/Radiology:    Latest Ref Rng & Units 12/21/2022    1:11 AM 12/19/2022    1:18 AM 12/18/2022    6:26 AM  CBC  WBC 4.0 - 10.5 K/uL 8.8  6.9  7.2   Hemoglobin 13.0 - 17.0 g/dL 7.9  7.4  7.6   Hematocrit 39.0 - 52.0 % 23.8  22.7  23.2   Platelets 150 - 400 K/uL 347  294  281     Lab Results  Component Value Date   NA 141 12/21/2022   K 3.3 (L) 12/21/2022   CL 105 12/21/2022   CO2 22 12/21/2022      Assessment/Plan: MRSA bacteremia L3-L4 discitis with epidural abscess-s/p laminectomy/decompression on 12/13 Right foot osteomyelitis/abscess-s/p transmetatarsal amputation on 12/17 Overall improved-repeat cultures negative so far Orthopedics/neurosurgery following-able to lift both lower extremities off the bed. Continue to work with PT/OT ID recommending 8 weeks of IV Daptomycin-end date 02/03/2023, repeat MRI L spine on 01/02/23- and to reconsult ID around 1/10 to revisit  antibiotic plan following repeat imaging Given history of  drug use-unfortunately will  remain inpatient to complete IV antibiotic duration.  Not a candidate for home therapies.  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   Great urine output overnight-nephrology following-on intermittent HD.   Continue to follow signs for renal recovery.   Will defer biopsy to nephrology service. Plan to keep Foley catheter in for the time being (failed voiding trial)-will need urology follow-up over the course of the next several weeks as AKI issue evolves. On flomax  Hypokalemia: Replete/recheck.  Normocytic anemia Due to worsening AKI No evidence of blood loss S/p 1 unit of PRBC on 12/25 Continue to follow CBC closely and transfuse if Hb<7  Peripheral neuropathy No longer on Neurontin-given worsening AKI.  Bipolar 1 disorder Stable Continue zoloft  Polysubstance abuse History of cocaine use Remains on Suboxone  Dental caries Outpatient dentistry follow-up  Morbid Obesity: Estimated body mass index is 37.96 kg/m as calculated from the following:   Height as of this encounter: 6\' 2"  (1.88 m).   Weight as of this encounter: 134.1 kg.   Code status:   Code Status: Full Code   DVT Prophylaxis: heparin injection 5,000 Units Start: 12/16/22 1000 SCD's Start: 12/09/22 1137   Family Communication: None at bedside   Disposition Plan: Status is: Inpatient Remains inpatient appropriate because: Severity of illness   Planned Discharge Destination:Home ultimately-but will need to remain inpatient to complete IV daptomycin-end date 02/04/20   Diet: Diet Order             Diet renal with fluid restriction Fluid restriction: 1200 mL Fluid; Room service appropriate? Yes; Fluid consistency: Thin  Diet effective now                     Antimicrobial agents: Anti-infectives (From admission, onward)    Start      Dose/Rate Route Frequency Ordered Stop   12/16/22 2000  DAPTOmycin (CUBICIN) 850 mg in sodium chloride 0.9 % IVPB        8 mg/kg  108.9 kg (Adjusted) 134 mL/hr over 30 Minutes Intravenous Every 48 hours 12/15/22 1145 02/04/23 1959   12/05/22 1800  DAPTOmycin (CUBICIN) 850 mg in sodium chloride 0.9 % IVPB  Status:  Discontinued        8 mg/kg  108.9 kg (Adjusted) 134 mL/hr over 30 Minutes Intravenous Daily 12/05/22 1654 12/15/22 1145   12/05/22 1200  DAPTOmycin (CUBICIN) 850 mg in sodium chloride 0.9 % IVPB  Status:  Discontinued        8 mg/kg  108.9 kg (Adjusted) 134 mL/hr over 30 Minutes Intravenous Daily 12/05/22 0923 12/05/22 1654   12/05/22 0800  vancomycin (VANCOREADY) IVPB 2000 mg/400 mL  Status:  Discontinued        2,000 mg 200 mL/hr over 120 Minutes Intravenous Every 24 hours 12/05/22 0632 12/05/22 0923   12/04/22 2200  ceFEPIme (MAXIPIME) 2 g in sodium chloride 0.9 % 100 mL IVPB  Status:  Discontinued        2 g 200 mL/hr over 30 Minutes Intravenous Every 12 hours 12/04/22 1242 12/05/22 0237   12/04/22 0600  ceFEPIme (MAXIPIME) 1 g in sodium chloride 0.9 % 100 mL IVPB  Status:  Discontinued        1 g 200 mL/hr over 30 Minutes Intravenous Every 24 hours 12/03/22 0508 12/04/22 1242   12/03/22 1100  ceFEPIme (MAXIPIME) 2 g in sodium chloride  0.9 % 100 mL IVPB        2 g 200 mL/hr over 30 Minutes Intravenous  Once 12/03/22 0418 12/03/22 1120   12/03/22 0508  vancomycin variable dose per unstable renal function (pharmacist dosing)  Status:  Discontinued         Does not apply See admin instructions 12/03/22 0508 12/05/22 0849   12/03/22 0500  metroNIDAZOLE (FLAGYL) IVPB 500 mg  Status:  Discontinued        500 mg 100 mL/hr over 60 Minutes Intravenous Every 12 hours 12/03/22 0418 12/05/22 0237   12/03/22 0430  vancomycin (VANCOCIN) IVPB 1000 mg/200 mL premix  Status:  Discontinued        1,000 mg 200 mL/hr over 60 Minutes Intravenous  Once 12/03/22 0418 12/03/22 0421    12/03/22 0415  vancomycin (VANCOREADY) IVPB 2000 mg/400 mL        2,000 mg 200 mL/hr over 120 Minutes Intravenous  Once 12/03/22 0404 12/03/22 0630   12/03/22 0200  cefTRIAXone (ROCEPHIN) 2 g in sodium chloride 0.9 % 100 mL IVPB        2 g 200 mL/hr over 30 Minutes Intravenous  Once 12/03/22 0153 12/03/22 0238        MEDICATIONS: Scheduled Meds:  buprenorphine-naloxone  1 tablet Sublingual BID   Chlorhexidine Gluconate Cloth  6 each Topical Q0600   heparin injection (subcutaneous)  5,000 Units Subcutaneous Q8H   nutrition supplement (JUVEN)  1 packet Oral BID BM   pantoprazole  40 mg Oral Daily   polyethylene glycol  17 g Oral Daily   sertraline  25 mg Oral Daily   tamsulosin  0.4 mg Oral QPC supper   thiamine  500 mg Oral Daily   Continuous Infusions:  DAPTOmycin (CUBICIN) 850 mg in sodium chloride 0.9 % IVPB Stopped (12/21/22 0700)   magnesium sulfate bolus IVPB     PRN Meds:.acetaminophen **OR** acetaminophen, alum & mag hydroxide-simeth, bisacodyl, guaiFENesin-dextromethorphan, HYDROmorphone (DILAUDID) injection, magnesium sulfate bolus IVPB, ondansetron **OR** ondansetron (ZOFRAN) IV, oxyCODONE, phenol, polyethylene glycol, tiZANidine, traZODone   I have personally reviewed following labs and imaging studies  LABORATORY DATA: CBC: Recent Labs  Lab 12/16/22 1344 12/17/22 1022 12/18/22 0626 12/19/22 0118 12/21/22 0111  WBC 8.2 6.8 7.2 6.9 8.8  HGB 6.5* 6.3* 7.6* 7.4* 7.9*  HCT 19.0* 18.8* 23.2* 22.7* 23.8*  MCV 85.6 85.8 86.9 87.0 87.5  PLT 301 282 281 294 347     Basic Metabolic Panel: Recent Labs  Lab 12/15/22 0509 12/15/22 1627 12/16/22 0337 12/16/22 0640 12/17/22 2315 12/18/22 0626 12/19/22 0118 12/20/22 0447 12/21/22 0111  NA 128*   < > 127*   < > 133* 135 137 141 141  K 5.8*   < > 7.0*   < > 4.8 5.0 3.8 3.3* 3.3*  CL 97*   < > 95*   < > 96* 98 100 105 105  CO2 19*   < > 16*   < > 22 22 24 23 22   GLUCOSE 96   < > 91   < > 109* 100* 107* 97 92   BUN 76*   < > 83*   < > 99* 101* 72* 73* 69*  CREATININE 5.17*   < > 5.86*   < > 7.07* 7.22* 5.58* 5.38* 4.95*  CALCIUM 7.7*   < > 7.9*   < > 7.2* 7.3* 7.4* 7.2* 7.6*  PHOS 7.5*  --  7.3*  --   --   --   --   --  6.3*   < > = values in this interval not displayed.     GFR: Estimated Creatinine Clearance: 27.5 mL/min (A) (by C-G formula based on SCr of 4.95 mg/dL (H)).  Liver Function Tests: Recent Labs  Lab 12/15/22 0509 12/16/22 0337 12/21/22 0111  ALBUMIN 1.8* 1.8* 1.6*    No results for input(s): "LIPASE", "AMYLASE" in the last 168 hours. No results for input(s): "AMMONIA" in the last 168 hours.  Coagulation Profile: No results for input(s): "INR", "PROTIME" in the last 168 hours.  Cardiac Enzymes: Recent Labs  Lab 12/20/22 0447  CKTOTAL 134     BNP (last 3 results) No results for input(s): "PROBNP" in the last 8760 hours.  Lipid Profile: No results for input(s): "CHOL", "HDL", "LDLCALC", "TRIG", "CHOLHDL", "LDLDIRECT" in the last 72 hours.  Thyroid Function Tests: No results for input(s): "TSH", "T4TOTAL", "FREET4", "T3FREE", "THYROIDAB" in the last 72 hours.  Anemia Panel: No results for input(s): "VITAMINB12", "FOLATE", "FERRITIN", "TIBC", "IRON", "RETICCTPCT" in the last 72 hours.  Urine analysis:    Component Value Date/Time   COLORURINE AMBER (A) 12/17/2022 1551   APPEARANCEUR TURBID (A) 12/17/2022 1551   LABSPEC 1.010 12/17/2022 1551   PHURINE 6.0 12/17/2022 1551   GLUCOSEU NEGATIVE 12/17/2022 1551   HGBUR MODERATE (A) 12/17/2022 1551   BILIRUBINUR NEGATIVE 12/17/2022 1551   KETONESUR NEGATIVE 12/17/2022 1551   PROTEINUR 100 (A) 12/17/2022 1551   UROBILINOGEN 0.2 04/30/2013 1416   NITRITE POSITIVE (A) 12/17/2022 1551   LEUKOCYTESUR MODERATE (A) 12/17/2022 1551    Sepsis Labs: Lactic Acid, Venous    Component Value Date/Time   LATICACIDVEN 1.2 12/03/2022 0007    MICROBIOLOGY: No results found for this or any previous visit (from the  past 240 hour(s)).   RADIOLOGY STUDIES/RESULTS: No results found.   LOS: 18 days   Oren Binet, MD  [facility]    To contact the attending provider between 7A-7P or the covering provider during after hours 7P-7A, please log into the web site [email or web address] and access using universal [place] password for that web site. If you do not have the password, please call the hospital operator.  12/21/2022, 12:18 PM

## 2022-12-21 NOTE — Progress Notes (Signed)
Occupational Therapy Treatment Patient Details Name: Hunter Reyes MRN: [id number] DOB: 01/09/1976 Today's Date: 12/21/2022   History of present illness 46 y.o. male admitted 12/10 with AMS, urinary retention and low back pain. Underwent L4 lumbar laminectomy for epidural abscess 12/13. s/p R foot transmet amp 12/17. Pt initiated HD on 12/26. PMH:  bipolar 1 disorder, borderline personality disorder, COPD, crack cocaine abuse, depression, peripheral neuropathy, history of right foot osteomyelitis, panic attack and history of seizures   OT comments  Focus session on self care and maintenance of/education regarding spinal and NWB precautions. Pt requiring max cues to maintain precautions at bed level on arrival, twisting torso and pushing weight through RLE. Pt soiled on arrival. Pt required total A for posterior pericare but good skill to roll toward R with supervision. Pt required min guard A and cueing to roll toward L due to max attempts to push weight through RLE. OT facilitating bed mobility x3 attempts to reiterate both spinal and NWB precautions. Pt additionally performing chair push ups to address UB strengthening and appropriate weight shift while maintaining NWB precautions. Will continue to follow acutely to optimize safety and independence in ADL.    Recommendations for follow up therapy are one component of a multi-disciplinary discharge planning process, led by the attending physician.  Recommendations may be updated based on patient status, additional functional criteria and insurance authorization.    Follow Up Recommendations  Skilled nursing-short term rehab (<3 hours/day)     Assistance Recommended at Discharge Intermittent Supervision/Assistance  Patient can return home with the following  A lot of help with bathing/dressing/bathroom;Assistance with cooking/housework;Help with stairs or ramp for entrance;Assist for transportation;A lot of help with walking and/or transfers    Equipment Recommendations  Wheelchair (measurements OT);Wheelchair cushion (measurements OT);Other (comment) (drop arm bariatric BSC)    Recommendations for Other Services      Precautions / Restrictions Precautions Precautions: Fall;Back Precaution Booklet Issued: No Precaution Comments: watch HR, incontinent stool Required Braces or Orthoses: Other Brace Other Brace: post op shoe Restrictions Weight Bearing Restrictions: Yes RLE Weight Bearing: Non weight bearing Other Position/Activity Restrictions: don darco shoe       Mobility Bed Mobility Overal bed mobility: Needs Assistance Bed Mobility: Rolling, Sidelying to Sit, Sit to Sidelying Rolling: Min guard Sidelying to sit: HOB elevated, Min assist     Sit to sidelying: Min assist      Transfers                   General transfer comment: focus session on bed mobility and pericare due to pt with poor maintenance of precautions while in bed     Balance                                           ADL either performed or assessed with clinical judgement   ADL Overall ADL's : Needs assistance/impaired Eating/Feeding: Set up;Bed level Eating/Feeding Details (indicate cue type and reason): beginning to self-feed on departure                         Toileting- Clothing Manipulation and Hygiene: Total assistance;Bed level Toileting - Clothing Manipulation Details (indicate cue type and reason): total A at bed level. Pt with good skill to roll for pericare but max cues to maintain NWB precautions       General  ADL Comments: Continued education regarding precautions    Extremity/Trunk Assessment Upper Extremity Assessment Upper Extremity Assessment: Generalized weakness   Lower Extremity Assessment Lower Extremity Assessment: Defer to PT evaluation        Vision   Vision Assessment?: No apparent visual deficits   Perception     Praxis      Cognition Arousal/Alertness:  Awake/alert Behavior During Therapy: WFL for tasks assessed/performed Overall Cognitive Status: No family/caregiver present to determine baseline cognitive functioning Area of Impairment: Problem solving, Awareness, Safety/judgement, Following commands, Memory, Attention, Orientation                 Orientation Level: Disoriented to, Situation Current Attention Level: Sustained Memory: Decreased recall of precautions, Decreased short-term memory Following Commands: Follows one step commands with increased time Safety/Judgement: Decreased awareness of safety, Decreased awareness of deficits Awareness: Emergent Problem Solving: Slow processing, Requires verbal cues, Requires tactile cues General Comments: Pt with poor recall of hospital events. pt additionally with poor recall of all precautions. Attempts to make jokes to mask deficits.        Exercises Exercises: Other exercises Other Exercises Other Exercises: chair push ups x10 Other Exercises: bed mobility 3x, as well as scooting up in bed while maintaining NWB through RLE    Shoulder Instructions       General Comments      Pertinent Vitals/ Pain       Pain Assessment Pain Assessment: Faces Faces Pain Scale: Hurts little more Pain Location: back Pain Descriptors / Indicators: Grimacing, Guarding Pain Intervention(s): Limited activity within patient's tolerance, Monitored during session  Home Living                                          Prior Functioning/Environment              Frequency  Min 2X/week        Progress Toward Goals  OT Goals(current goals can now be found in the care plan section)  Progress towards OT goals: Progressing toward goals  Acute Rehab OT Goals Patient Stated Goal: none stated OT Goal Formulation: With patient Time For Goal Achievement: 12/24/22 Potential to Achieve Goals: Fair ADL Goals Pt Will Perform Grooming: with modified  independence;standing Pt Will Perform Lower Body Dressing: with modified independence;sit to/from stand Pt Will Transfer to Toilet: stand pivot transfer;bedside commode;with min assist Pt Will Perform Tub/Shower Transfer: Tub transfer;Stand pivot transfer;with min assist;tub bench Additional ADL Goal #1: Patient will maintain NWB on RLE and spinal precautions throughout ADLs.  Plan Discharge plan remains appropriate    Co-evaluation                 AM-PAC OT "6 Clicks" Daily Activity     Outcome Measure   Help from another person eating meals?: None Help from another person taking care of personal grooming?: A Little Help from another person toileting, which includes using toliet, bedpan, or urinal?: A Lot Help from another person bathing (including washing, rinsing, drying)?: A Lot Help from another person to put on and taking off regular upper body clothing?: A Little Help from another person to put on and taking off regular lower body clothing?: A Lot 6 Click Score: 16    End of Session    OT Visit Diagnosis: Unsteadiness on feet (R26.81);Muscle weakness (generalized) (M62.81);Pain;Other symptoms and signs involving cognitive function;Other abnormalities of gait and  mobility (R26.89) Pain - part of body:  (back)   Activity Tolerance Patient tolerated treatment well   Patient Left in bed;with bed alarm set;with call bell/phone within reach   Nurse Communication Mobility status;Other (comment) (incontinence, min bleeding from R side due to scratching)        Time: 8719-5974 OT Time Calculation (min): 27 min  Charges: OT General Charges $OT Visit: 1 Visit OT Treatments $Self Care/Home Management : 8-22 mins $Therapeutic Activity: 8-22 mins  Elder Cyphers, OTR/L [facility] Office: [phone]   Magnus Ivan 12/21/2022, 5:38 PM

## 2022-12-21 NOTE — Progress Notes (Signed)
Per MD Candiss Norse, this patient does not require HD today.

## 2022-12-22 DIAGNOSIS — R7881 Bacteremia: Secondary | ICD-10-CM | POA: Diagnosis not present

## 2022-12-22 DIAGNOSIS — B9562 Methicillin resistant Staphylococcus aureus infection as the cause of diseases classified elsewhere: Secondary | ICD-10-CM | POA: Diagnosis not present

## 2022-12-22 LAB — CBC WITH DIFFERENTIAL/PLATELET
Abs Immature Granulocytes: 0.13 10*3/uL — ABNORMAL HIGH (ref 0.00–0.07)
Basophils Absolute: 0.1 10*3/uL (ref 0.0–0.1)
Basophils Relative: 1 %
Eosinophils Absolute: 0.4 10*3/uL (ref 0.0–0.5)
Eosinophils Relative: 4 %
HCT: 23.8 % — ABNORMAL LOW (ref 39.0–52.0)
Hemoglobin: 7.6 g/dL — ABNORMAL LOW (ref 13.0–17.0)
Immature Granulocytes: 1 %
Lymphocytes Relative: 18 %
Lymphs Abs: 1.7 10*3/uL (ref 0.7–4.0)
MCH: 28 pg (ref 26.0–34.0)
MCHC: 31.9 g/dL (ref 30.0–36.0)
MCV: 87.8 fL (ref 80.0–100.0)
Monocytes Absolute: 1.6 10*3/uL — ABNORMAL HIGH (ref 0.1–1.0)
Monocytes Relative: 17 %
Neutro Abs: 5.6 10*3/uL (ref 1.7–7.7)
Neutrophils Relative %: 59 %
Platelets: 399 10*3/uL (ref 150–400)
RBC: 2.71 MIL/uL — ABNORMAL LOW (ref 4.22–5.81)
RDW: 14.2 % (ref 11.5–15.5)
WBC: 9.6 10*3/uL (ref 4.0–10.5)
nRBC: 0 % (ref 0.0–0.2)

## 2022-12-22 LAB — RETICULOCYTES
Immature Retic Fract: 13.2 % (ref 2.3–15.9)
RBC.: 2.9 MIL/uL — ABNORMAL LOW (ref 4.22–5.81)
Retic Count, Absolute: 43.5 10*3/uL (ref 19.0–186.0)
Retic Ct Pct: 1.5 % (ref 0.4–3.1)

## 2022-12-22 LAB — IRON AND TIBC
Iron: 20 ug/dL — ABNORMAL LOW (ref 45–182)
Saturation Ratios: 10 % — ABNORMAL LOW (ref 17.9–39.5)
TIBC: 199 ug/dL — ABNORMAL LOW (ref 250–450)
UIBC: 179 ug/dL

## 2022-12-22 LAB — RENAL FUNCTION PANEL
Albumin: 1.7 g/dL — ABNORMAL LOW (ref 3.5–5.0)
Anion gap: 15 (ref 5–15)
BUN: 66 mg/dL — ABNORMAL HIGH (ref 6–20)
CO2: 24 mmol/L (ref 22–32)
Calcium: 7.8 mg/dL — ABNORMAL LOW (ref 8.9–10.3)
Chloride: 104 mmol/L (ref 98–111)
Creatinine, Ser: 4.57 mg/dL — ABNORMAL HIGH (ref 0.61–1.24)
GFR, Estimated: 15 mL/min — ABNORMAL LOW (ref >60)
Glucose, Bld: 96 mg/dL (ref 70–99)
Phosphorus: 5.9 mg/dL — ABNORMAL HIGH (ref 2.5–4.6)
Potassium: 3.2 mmol/L — ABNORMAL LOW (ref 3.5–5.1)
Sodium: 143 mmol/L (ref 135–145)

## 2022-12-22 LAB — FERRITIN: Ferritin: 392 ng/mL — ABNORMAL HIGH (ref 24–336)

## 2022-12-22 LAB — C-REACTIVE PROTEIN: CRP: 14.1 mg/dL — ABNORMAL HIGH (ref <1.0)

## 2022-12-22 LAB — VITAMIN B12: Vitamin B-12: 240 pg/mL (ref 180–914)

## 2022-12-22 LAB — MAGNESIUM: Magnesium: 1.5 mg/dL — ABNORMAL LOW (ref 1.7–2.4)

## 2022-12-22 LAB — FOLATE: Folate: 14.3 ng/mL (ref >5.9)

## 2022-12-22 MED ORDER — DOCUSATE SODIUM 100 MG PO CAPS
200.0000 mg | ORAL_CAPSULE | Freq: Two times a day (BID) | ORAL | Status: DC
  Administered 2022-12-22 – 2023-01-15 (×47): 200 mg via ORAL
  Filled 2022-12-22 (×55): qty 2

## 2022-12-22 MED ORDER — POTASSIUM CHLORIDE CRYS ER 20 MEQ PO TBCR
40.0000 meq | EXTENDED_RELEASE_TABLET | Freq: Once | ORAL | Status: AC
  Administered 2022-12-22: 40 meq via ORAL
  Filled 2022-12-22: qty 2

## 2022-12-22 MED ORDER — BISACODYL 5 MG PO TBEC
10.0000 mg | DELAYED_RELEASE_TABLET | Freq: Every day | ORAL | Status: DC | PRN
  Filled 2022-12-22: qty 2

## 2022-12-22 MED ORDER — MAGNESIUM SULFATE 4 GM/100ML IV SOLN
4.0000 g | Freq: Once | INTRAVENOUS | Status: AC
  Administered 2022-12-22: 4 g via INTRAVENOUS
  Filled 2022-12-22: qty 100

## 2022-12-22 MED ORDER — DARBEPOETIN ALFA 60 MCG/0.3ML IJ SOSY
60.0000 ug | PREFILLED_SYRINGE | Freq: Once | INTRAMUSCULAR | Status: AC
  Administered 2022-12-22: 60 ug via SUBCUTANEOUS
  Filled 2022-12-22: qty 0.3

## 2022-12-22 MED ORDER — POTASSIUM CHLORIDE CRYS ER 20 MEQ PO TBCR
20.0000 meq | EXTENDED_RELEASE_TABLET | Freq: Once | ORAL | Status: AC
  Administered 2022-12-22: 20 meq via ORAL
  Filled 2022-12-22: qty 1

## 2022-12-22 NOTE — Progress Notes (Signed)
PROGRESS NOTE        PATIENT DETAILS Name: Hunter Reyes Age: 46 y.o. Sex: male Date of Birth: 1976-01-02 Admit Date: 12/02/2022 Admitting Physician [name], MD [id number], Edwinna Areola, MD  Brief Summary: Patient is a 46 y.o.  male with history of bipolar disorder, crack cocaine use, peripheral neuropathy-who was transferred from [facility] for AKI/urinary retention-upon further evaluation-he was found to have MRSA bacteremia with right foot abscess requiring TMA, lumbar discitis/abscess-requiring emergent laminectomy.  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  Significant events: 12/10>> admit to [facility]-AKI-urinary retention.  Plans to transfer to [facility]. 12/13>> laminectomy/decompression of ventral lumbar epidural abscess 12/17>> transmetatarsal amputation of right foot 12/21>>voiding trial-foley removed 12/22>>worsening renal function-urinary retention-foley replaced 12/25>>Urine protein creatinine ratio of 7.68-w hematuria-suggestion of new AKI is 2/2 infection GN 12/26>>Temp HD cath placed by IR-HD started  Significant studies: 12/11>> CT renal stone study: Possible discitis L2-L3/L3-L4, moderate bilateral hydronephrosis/hydroureter 12/13>> MRI L-spine: Discitis/osteomyelitis at L3-L4 with 9 mm thick ventral canal phlegmon/abscess which extends from L3 to L4-L5.  Multiple paraspinal abscesses within the psoas musculature. 12/13>> echo: EF 60-65%, no evidence of valvular vegetations. 12/14>> MRI left foot: No osteomyelitis/abscess 12/15>> MRI right foot: Abscess with osteomyelitis of second/third metatarsals. 12/25>>Urine protein creatinine ratio of 7.68 12/25>> ASO titer: Negative 12/25>> ANA: Negative 12/25>> ANCA titers: Negative 12/25>> GBM antibody: Negative 12/25>> C3/C4: Normal limits. 12/25>> HBV/HCV serology:   Significant microbiology data: 12/10>> COVID/influenza PCR:  Negative 12/11>> blood culture: MRSA 12/13>> blood culture: MRSA 12/13>> epidural abscess culture: MRSA 12/14>> blood culture: No growth  Procedures: 12/13>> right L4 laminectomy-sublaminar decompression of ventral epidural abscess. 12/17>> transmetatarsal amputation of right foot 12/26>>Right IJ Trialysis CVC placed by IR  Consults: ID Neurosurgery IR Renal  Subjective:  Patient in bed, appears comfortable, denies any headache, no fever, no chest pain or pressure, no shortness of breath , no abdominal pain. No new focal weakness.   Objective:  Vitals: Blood pressure 112/63, pulse 83, temperature 98.2 F (36.8 C), temperature source Oral, resp. rate 16, height 6\' 2"  (1.88 m), weight 134.1 kg, SpO2 98 %.   Exam:  Awake Alert, No new F.N deficits, Normal affect [place].AT,PERRAL Supple Neck, No JVD,   Symmetrical Chest wall movement, Good air movement bilaterally, CTAB RRR,No Gallops, Rubs or new Murmurs,  +ve B.Sounds, Abd Soft, No tenderness,   Right transmetatarsal partial amputation with wound VAC, right IJ dialysis catheter in place, 1+ edema    Assessment/Plan:  MRSA bacteremia, L3-L4 discitis with epidural abscess-s/p laminectomy/decompression on 12/05/22 - Right foot osteomyelitis/abscess-s/p transmetatarsal amputation on 12/09/22 Overall improved-repeat cultures negative so far Orthopedics/neurosurgery following-able to lift both lower extremities off the bed. Continue to work with PT/OT ID recommending 8 weeks of IV Daptomycin-end date 02/03/2023, repeat MRI L spine on 01/02/23- and to reconsult ID around 1/10 to revisit  antibiotic plan following repeat imaging Given history of drug use-unfortunately will remain inpatient to complete IV antibiotic duration.  Not a candidate for home therapies.   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   Great urine output overnight-nephrology following-on intermittent HD.   Continue to follow signs for renal recovery.   Will defer biopsy to nephrology service. Plan to keep Foley catheter  in for the time being (failed voiding trial)-will need urology follow-up over the course of the next several weeks as AKI issue evolves. On flomax  Hypokalemia: Replete/recheck.  Normocytic anemia Received 1 unit of packed RBC on 12/17/2022, H&H gradually dropping, check anemia panel.  No signs of brisk bleeding.  Likely due to acute sickness and multiple blood draws.  Peripheral neuropathy No longer on Neurontin-given worsening AKI.  Bipolar 1 disorder Stable Continue zoloft  Polysubstance abuse History of cocaine use Remains on Suboxone  Dental caries Outpatient dentistry follow-up  Morbid Obesity: Estimated body mass index is 37.96 kg/m as calculated from the following:   Height as of this encounter: 6\' 2"  (1.88 m).   Weight as of this encounter: 134.1 kg.   Code status:   Code Status: Full Code   DVT Prophylaxis: heparin injection 5,000 Units Start: 12/16/22 1000 SCD's Start: 12/09/22 1137   Family Communication: None at bedside   Disposition Plan: Status is: Inpatient Remains inpatient appropriate because: Severity of illness   Planned Discharge Destination:Home ultimately-but will need to remain inpatient to complete IV daptomycin-end date 02/04/20   Diet: Diet Order             Diet renal with fluid restriction Fluid restriction: 1200 mL Fluid; Room service appropriate? Yes; Fluid consistency: Thin  Diet effective now                    MEDICATIONS: Scheduled Meds:  buprenorphine-naloxone  1 tablet Sublingual BID   Chlorhexidine Gluconate Cloth  6 each Topical Q0600   docusate sodium  200 mg Oral BID   heparin injection (subcutaneous)  5,000 Units Subcutaneous Q8H   nutrition supplement (JUVEN)  1 packet Oral BID BM    pantoprazole  40 mg Oral Daily   polyethylene glycol  17 g Oral Daily   potassium chloride  20 mEq Oral Once   sertraline  25 mg Oral Daily   tamsulosin  0.4 mg Oral QPC supper   thiamine  500 mg Oral Daily   Continuous Infusions:  DAPTOmycin (CUBICIN) 850 mg in sodium chloride 0.9 % IVPB Stopped (12/21/22 0700)   magnesium sulfate bolus IVPB     PRN Meds:.acetaminophen **OR** acetaminophen, alum & mag hydroxide-simeth, bisacodyl, guaiFENesin-dextromethorphan, HYDROmorphone (DILAUDID) injection, ondansetron **OR** ondansetron (ZOFRAN) IV, oxyCODONE, phenol, tiZANidine, traZODone   I have personally reviewed following labs and imaging studies  LABORATORY DATA:  Recent Labs  Lab 12/17/22 1022 12/18/22 0626 12/19/22 0118 12/21/22 0111 12/22/22 0233  WBC 6.8 7.2 6.9 8.8 9.6  HGB 6.3* 7.6* 7.4* 7.9* 7.6*  HCT 18.8* 23.2* 22.7* 23.8* 23.8*  PLT 282 281 294 347 399  MCV 85.8 86.9 87.0 87.5 87.8  MCH 28.8 28.5 28.4 29.0 28.0  MCHC 33.5 32.8 32.6 33.2 31.9  RDW 13.6 13.9 13.7 14.1 14.2  LYMPHSABS  --   --   --   --  1.7  MONOABS  --   --   --   --  1.6*  EOSABS  --   --   --   --  0.4  BASOSABS  --   --   --   --  0.1    Recent Labs  Lab 12/16/22 0337 12/16/22 0640 12/18/22 0626 12/19/22 0118 12/20/22 0447 12/21/22 0111 12/22/22 0233  NA 127*   < > 135 137 141 141 143  K 7.0*   < > 5.0 3.8 3.3* 3.3* 3.2*  CL 95*   < > 98 100 105  105 104  CO2 16*   < > 22 24 23 22 24   ANIONGAP 16*   < > 15 13 13 14 15   GLUCOSE 91   < > 100* 107* 97 92 96  BUN 83*   < > 101* 72* 73* 69* 66*  CREATININE 5.86*   < > 7.22* 5.58* 5.38* 4.95* 4.57*  ALBUMIN 1.8*  --   --   --   --  1.6* 1.7*  CRP  --   --   --   --   --   --  14.1*  MG  --   --   --   --   --   --  1.5*  CALCIUM 7.9*   < > 7.3* 7.4* 7.2* 7.6* 7.8*   < > = values in this interval not displayed.    RADIOLOGY STUDIES/RESULTS: No results found.   LOS: 19 days   Signature  -    Lala Lund M.D on 12/22/2022 at 10:27  AM   -  To page go to [email or web address]

## 2022-12-22 NOTE — Progress Notes (Signed)
report given to Devin Bozeman, RN 

## 2022-12-22 NOTE — Progress Notes (Signed)
Hunter Reyes Progress Note   Assessment/ Plan:   1. AKI: recurrent, d/t obstructive uropathy. Good UOP.  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  May end up considering biopsy, however doubt would change management (would suspect underlying infection related GN; would treat infection and not immunosuppress in setting of disseminated MRSA) so will not pursue biopsy at this junction especially given that his Cr+UOP are improving - started HD 12/26 for progressive azotema  - Cr continues to improve, now seems to be in polyuric phase of renal recovery--watch carefully, replete lytes prn and may need fluids depending on extent of output. Hopefully will be able to d/c HD catheter in the next day or so  2.  Hyperkalemia:  has improved with medical management and HD  - cont to renal diet  -stop lokelma  -now hypoK in the context of polyuric phase, replete PRN  3.  Disseminated MRSA>  discitis, osteo multiple locations, bacteremia  - on daptomycin. Will remain inpatient. Per primary service  4.  Anemia:  - being transfused PRN per primary, avoid IV Fe. Will give a dose of ESA 12/30  5. Protein cal malnutrition  -push protein  Hunter Quint, MD [facility] Subjective:    Seen in room - no new issues/complaints. 6L urine output   Objective:   BP 118/73 (BP Location: Left Arm)   Pulse 84   Temp 98 F (36.7 C) (Oral)   Resp 18   Ht 6\' 2"  (1.88 m)   Wt 134.1 kg   SpO2 98%   BMI 37.96 kg/m   Intake/Output Summary (Last 24 hours) at 12/22/2022 1432 Last data filed at 12/21/2022 2310 Gross per 24 hour  Intake --  Output 1400 ml  Net -1400 ml   Weight change:   Physical Exam: GEX:BMWUXLK chronically ill  HEENT: MMM, very poor dentition CVS: RRR. No rub Resp: clear Abd: soft Ext: trace to 1+ pitting edema GU: tea-colored urine, +foley Neuro: awake, alert Dialysis  access: RIJ temp HD line c/d/i  Imaging: No results found.  Labs: BMET Recent Labs  Lab 12/16/22 0337 12/16/22 0640 12/17/22 1807 12/17/22 2315 12/18/22 0626 12/19/22 0118 12/20/22 0447 12/21/22 0111 12/22/22 0233  NA 127*   < > 131* 133* 135 137 141 141 143  K 7.0*   < > 5.1 4.8 5.0 3.8 3.3* 3.3* 3.2*  CL 95*   < > 94* 96* 98 100 105 105 104  CO2 16*   < > 23 22 22 24 23 22 24   GLUCOSE 91   < > 102* 109* 100* 107* 97 92 96  BUN 83*   < > 98* 99* 101* 72* 73* 69* 66*  CREATININE 5.86*   < > 7.27* 7.07* 7.22* 5.58* 5.38* 4.95* 4.57*  CALCIUM 7.9*   < > 7.3* 7.2* 7.3* 7.4* 7.2* 7.6* 7.8*  PHOS 7.3*  --   --   --   --   --   --  6.3* 5.9*   < > = values in this interval not displayed.   CBC Recent Labs  Lab 12/18/22 0626 12/19/22 0118 12/21/22 0111 12/22/22 0233  WBC 7.2 6.9 8.8 9.6  NEUTROABS  --   --   --  5.6  HGB 7.6* 7.4* 7.9* 7.6*  HCT 23.2* 22.7* 23.8* 23.8*  MCV 86.9 87.0 87.5 87.8  PLT 281 294 347 399  Medications:     buprenorphine-naloxone  1 tablet Sublingual BID   Chlorhexidine Gluconate Cloth  6 each Topical Q0600   docusate sodium  200 mg Oral BID   heparin injection (subcutaneous)  5,000 Units Subcutaneous Q8H   nutrition supplement (JUVEN)  1 packet Oral BID BM   pantoprazole  40 mg Oral Daily   polyethylene glycol  17 g Oral Daily   potassium chloride  20 mEq Oral Once   sertraline  25 mg Oral Daily   tamsulosin  0.4 mg Oral QPC supper   thiamine  500 mg Oral Daily

## 2022-12-22 NOTE — Plan of Care (Signed)
  Problem: Fluid Volume: Goal: Hemodynamic stability will improve Outcome: Progressing   Problem: Clinical Measurements: Goal: Diagnostic test results will improve Outcome: Progressing Goal: Signs and symptoms of infection will decrease Outcome: Progressing   Problem: Respiratory: Goal: Ability to maintain adequate ventilation will improve Outcome: Progressing   Problem: Education: Goal: Knowledge of General Education information will improve Description: Including pain rating scale, medication(s)/side effects and non-pharmacologic comfort measures Outcome: Progressing   Problem: Health Behavior/Discharge Planning: Goal: Ability to manage health-related needs will improve Outcome: Progressing   Problem: Clinical Measurements: Goal: Ability to maintain clinical measurements within normal limits will improve Outcome: Progressing Goal: Will remain free from infection Outcome: Progressing Goal: Diagnostic test results will improve Outcome: Progressing Goal: Respiratory complications will improve Outcome: Progressing Goal: Cardiovascular complication will be avoided Outcome: Progressing   Problem: Activity: Goal: Risk for activity intolerance will decrease Outcome: Progressing   Problem: Nutrition: Goal: Adequate nutrition will be maintained Outcome: Progressing   Problem: Coping: Goal: Level of anxiety will decrease Outcome: Progressing   Problem: Elimination: Goal: Will not experience complications related to bowel motility Outcome: Progressing Goal: Will not experience complications related to urinary retention Outcome: Progressing   Problem: Pain Managment: Goal: General experience of comfort will improve Outcome: Progressing   Problem: Safety: Goal: Ability to remain free from injury will improve Outcome: Progressing   Problem: Skin Integrity: Goal: Risk for impaired skin integrity will decrease Outcome: Progressing   Problem: Education: Goal:  Knowledge of the prescribed therapeutic regimen will improve Outcome: Progressing Goal: Ability to verbalize activity precautions or restrictions will improve Outcome: Progressing Goal: Understanding of discharge needs will improve Outcome: Progressing   Problem: Activity: Goal: Ability to perform//tolerate increased activity and mobilize with assistive devices will improve Outcome: Progressing   Problem: Clinical Measurements: Goal: Postoperative complications will be avoided or minimized Outcome: Progressing   Problem: Self-Care: Goal: Ability to meet self-care needs will improve Outcome: Progressing   Problem: Self-Concept: Goal: Ability to maintain and perform role responsibilities to the fullest extent possible will improve Outcome: Progressing   Problem: Pain Management: Goal: Pain level will decrease with appropriate interventions Outcome: Progressing   Problem: Skin Integrity: Goal: Demonstration of wound healing without infection will improve Outcome: Progressing

## 2022-12-22 NOTE — Progress Notes (Signed)
Pt has been c/o severe pain tonight more than in the past few nights after reading the the progress notes I have had him moving more tonight himself than the other night from what he told me, I tried to explain to him he will have to do as much for himself as he can, that he needs to keep active as much as possible, he said he understood but it was painful

## 2022-12-23 DIAGNOSIS — R7881 Bacteremia: Secondary | ICD-10-CM | POA: Diagnosis not present

## 2022-12-23 DIAGNOSIS — B9562 Methicillin resistant Staphylococcus aureus infection as the cause of diseases classified elsewhere: Secondary | ICD-10-CM | POA: Diagnosis not present

## 2022-12-23 LAB — CBC WITH DIFFERENTIAL/PLATELET
Abs Immature Granulocytes: 0.21 10*3/uL — ABNORMAL HIGH (ref 0.00–0.07)
Basophils Absolute: 0.1 10*3/uL (ref 0.0–0.1)
Basophils Relative: 1 %
Eosinophils Absolute: 0.5 10*3/uL (ref 0.0–0.5)
Eosinophils Relative: 5 %
HCT: 25.8 % — ABNORMAL LOW (ref 39.0–52.0)
Hemoglobin: 8.3 g/dL — ABNORMAL LOW (ref 13.0–17.0)
Immature Granulocytes: 2 %
Lymphocytes Relative: 18 %
Lymphs Abs: 1.9 10*3/uL (ref 0.7–4.0)
MCH: 28.8 pg (ref 26.0–34.0)
MCHC: 32.2 g/dL (ref 30.0–36.0)
MCV: 89.6 fL (ref 80.0–100.0)
Monocytes Absolute: 1.6 10*3/uL — ABNORMAL HIGH (ref 0.1–1.0)
Monocytes Relative: 16 %
Neutro Abs: 6.1 10*3/uL (ref 1.7–7.7)
Neutrophils Relative %: 58 %
Platelets: 454 10*3/uL — ABNORMAL HIGH (ref 150–400)
RBC: 2.88 MIL/uL — ABNORMAL LOW (ref 4.22–5.81)
RDW: 14.3 % (ref 11.5–15.5)
WBC: 10.4 10*3/uL (ref 4.0–10.5)
nRBC: 0 % (ref 0.0–0.2)

## 2022-12-23 LAB — RENAL FUNCTION PANEL
Albumin: 1.8 g/dL — ABNORMAL LOW (ref 3.5–5.0)
Anion gap: 16 — ABNORMAL HIGH (ref 5–15)
BUN: 60 mg/dL — ABNORMAL HIGH (ref 6–20)
CO2: 24 mmol/L (ref 22–32)
Calcium: 8.1 mg/dL — ABNORMAL LOW (ref 8.9–10.3)
Chloride: 105 mmol/L (ref 98–111)
Creatinine, Ser: 4.33 mg/dL — ABNORMAL HIGH (ref 0.61–1.24)
GFR, Estimated: 16 mL/min — ABNORMAL LOW (ref >60)
Glucose, Bld: 94 mg/dL (ref 70–99)
Phosphorus: 6.2 mg/dL — ABNORMAL HIGH (ref 2.5–4.6)
Potassium: 3.5 mmol/L (ref 3.5–5.1)
Sodium: 145 mmol/L (ref 135–145)

## 2022-12-23 LAB — MAGNESIUM: Magnesium: 2.2 mg/dL (ref 1.7–2.4)

## 2022-12-23 LAB — C-REACTIVE PROTEIN: CRP: 13.1 mg/dL — ABNORMAL HIGH (ref <1.0)

## 2022-12-23 MED ORDER — SODIUM CHLORIDE 0.9 % IV SOLN
8.0000 mg/kg | INTRAVENOUS | Status: DC
  Administered 2022-12-23 – 2022-12-30 (×8): 850 mg via INTRAVENOUS
  Filled 2022-12-23 (×9): qty 17

## 2022-12-23 MED ORDER — POTASSIUM CHLORIDE CRYS ER 20 MEQ PO TBCR
20.0000 meq | EXTENDED_RELEASE_TABLET | Freq: Once | ORAL | Status: AC
  Administered 2022-12-23: 20 meq via ORAL
  Filled 2022-12-23: qty 1

## 2022-12-23 MED ORDER — FERROUS SULFATE 325 (65 FE) MG PO TABS
325.0000 mg | ORAL_TABLET | Freq: Two times a day (BID) | ORAL | Status: DC
  Administered 2022-12-23 – 2023-01-21 (×57): 325 mg via ORAL
  Filled 2022-12-23 (×59): qty 1

## 2022-12-23 MED ORDER — CYANOCOBALAMIN 1000 MCG/ML IJ SOLN
1000.0000 ug | Freq: Every day | INTRAMUSCULAR | Status: AC
  Administered 2022-12-23 – 2022-12-25 (×3): 1000 ug via SUBCUTANEOUS
  Filled 2022-12-23 (×3): qty 1

## 2022-12-23 MED ORDER — FOLIC ACID 1 MG PO TABS
1.0000 mg | ORAL_TABLET | Freq: Every day | ORAL | Status: DC
  Administered 2022-12-23 – 2023-02-03 (×43): 1 mg via ORAL
  Filled 2022-12-23 (×43): qty 1

## 2022-12-23 MED ORDER — VITAMIN B-12 1000 MCG PO TABS
1000.0000 ug | ORAL_TABLET | Freq: Every day | ORAL | Status: DC
  Administered 2022-12-26 – 2023-02-03 (×40): 1000 ug via ORAL
  Filled 2022-12-23 (×40): qty 1

## 2022-12-23 NOTE — Progress Notes (Signed)
PROGRESS NOTE        PATIENT DETAILS Name: Hunter Reyes Age: 46 y.o. Sex: male Date of Birth: 30-Dec-1975 Admit Date: 12/02/2022 Admitting Physician [name], MD UEK:[id number], Edwinna Areola, MD  Brief Summary: Patient is a 46 y.o.  male with history of bipolar disorder, crack cocaine use, peripheral neuropathy-who was transferred from [facility] for AKI/urinary retention-upon further evaluation-he was found to have MRSA bacteremia with right foot abscess requiring TMA, lumbar discitis/abscess-requiring emergent laminectomy.  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  Significant events: 12/10>> admit to [facility] at AP-AKI-urinary retention.  Plans to transfer to [facility]. 12/13>> laminectomy/decompression of ventral lumbar epidural abscess 12/17>> transmetatarsal amputation of right foot 12/21>>voiding trial-foley removed 12/22>>worsening renal function-urinary retention-foley replaced 12/25>>Urine protein creatinine ratio of 7.68-w hematuria-suggestion of new AKI is 2/2 infection GN 12/26>>Temp HD cath placed by IR-HD started  Significant studies: 12/11>> CT renal stone study: Possible discitis L2-L3/L3-L4, moderate bilateral hydronephrosis/hydroureter 12/13>> MRI L-spine: Discitis/osteomyelitis at L3-L4 with 9 mm thick ventral canal phlegmon/abscess which extends from L3 to L4-L5.  Multiple paraspinal abscesses within the psoas musculature. 12/13>> echo: EF 60-65%, no evidence of valvular vegetations. 12/14>> MRI left foot: No osteomyelitis/abscess 12/15>> MRI right foot: Abscess with osteomyelitis of second/third metatarsals. 12/25>>Urine protein creatinine ratio of 7.68 12/25>> ASO titer: Negative 12/25>> ANA: Negative 12/25>> ANCA titers: Negative 12/25>> GBM antibody: Negative 12/25>> C3/C4: Normal limits. 12/25>> HBV/HCV serology:   Significant microbiology data: 12/10>> COVID/influenza PCR:  Negative 12/11>> blood culture: MRSA 12/13>> blood culture: MRSA 12/13>> epidural abscess culture: MRSA 12/14>> blood culture: No growth  Procedures: 12/13>> right L4 laminectomy-sublaminar decompression of ventral epidural abscess. 12/17>> transmetatarsal amputation of right foot 12/26>>Right IJ Trialysis CVC placed by IR  Consults: ID Neurosurgery IR Renal  Subjective: Patient in bed, appears comfortable, denies any headache, no fever, no chest pain or pressure, no shortness of breath , no abdominal pain. No focal weakness.   Objective:  Vitals: Blood pressure 117/69, pulse 77, temperature 98.1 F (36.7 C), temperature source Axillary, resp. rate 18, height 6\' 2"  (1.88 m), weight 134.1 kg, SpO2 96 %.   Exam:  Awake Alert, No new F.N deficits, Normal affect .AT,PERRAL Supple Neck, No JVD,   Symmetrical Chest wall movement, Good air movement bilaterally, CTAB RRR,No Gallops, Rubs or new Murmurs,  +ve B.Sounds, Abd Soft, No tenderness,    Right transmetatarsal partial amputation with wound VAC, right IJ dialysis catheter in place, 1+ edema, Foley catheter in place,     Assessment/Plan:  MRSA bacteremia, L3-L4 discitis with epidural abscess-s/p laminectomy/decompression on 12/05/22 - Right foot osteomyelitis/abscess-s/p transmetatarsal amputation on 12/09/22 Overall improved-repeat cultures negative so far Orthopedics/neurosurgery following-able to lift both lower extremities off the bed. Continue to work with PT/OT ID recommending 8 weeks of IV Daptomycin-end date 02/03/2023, repeat MRI L spine on 01/02/23- and to reconsult ID around 1/10 to revisit  antibiotic plan following repeat imaging Given history of drug use-unfortunately will remain inpatient to complete IV antibiotic duration.  Not a candidate for home therapies.   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   Great urine output overnight-nephrology following-on intermittent HD.   Continue to follow signs for renal recovery.   Will defer biopsy to nephrology service. Plan  to keep Foley catheter in for the time being (failed voiding trial)-will need urology follow-up over the course of the next several weeks as AKI issue evolves. On flomax  Hypokalemia: Replete/recheck.  Normocytic anemia Received 1 unit of packed RBC on 12/17/2022, H&H gradually dropping, anemia panel suggestive of mixed picture of some iron deficiency and relative B12 deficiency.  Placed on replacement.  No signs of bleeding.  On absurd continue to monitor.  Peripheral neuropathy No longer on Neurontin-given worsening AKI.  Bipolar 1 disorder Stable Continue zoloft  Polysubstance abuse History of cocaine use Remains on Suboxone  Dental caries Outpatient dentistry follow-up  Morbid Obesity: Estimated body mass index is 37.96 kg/m as calculated from the following:   Height as of this encounter: 6\' 2"  (1.88 m).   Weight as of this encounter: 134.1 kg.   Code status:   Code Status: Full Code   DVT Prophylaxis: heparin injection 5,000 Units Start: 12/16/22 1000 SCD's Start: 12/09/22 1137   Family Communication: None at bedside   Disposition Plan: Status is: Inpatient Remains inpatient appropriate because: Severity of illness   Planned Discharge Destination:Home ultimately-but will need to remain inpatient to complete IV daptomycin-end date 02/04/20   Diet: Diet Order             Diet renal with fluid restriction Fluid restriction: 1200 mL Fluid; Room service appropriate? Yes; Fluid consistency: Thin  Diet effective now                    MEDICATIONS: Scheduled Meds:  buprenorphine-naloxone  1 tablet Sublingual BID   Chlorhexidine Gluconate Cloth  6 each Topical Q0600   docusate sodium  200 mg Oral BID   heparin injection  (subcutaneous)  5,000 Units Subcutaneous Q8H   nutrition supplement (JUVEN)  1 packet Oral BID BM   pantoprazole  40 mg Oral Daily   polyethylene glycol  17 g Oral Daily   sertraline  25 mg Oral Daily   tamsulosin  0.4 mg Oral QPC supper   thiamine  500 mg Oral Daily   Continuous Infusions:  DAPTOmycin (CUBICIN) 850 mg in sodium chloride 0.9 % IVPB     PRN Meds:.acetaminophen **OR** acetaminophen, alum & mag hydroxide-simeth, bisacodyl, guaiFENesin-dextromethorphan, HYDROmorphone (DILAUDID) injection, ondansetron **OR** ondansetron (ZOFRAN) IV, oxyCODONE, phenol, tiZANidine, traZODone   I have personally reviewed following labs and imaging studies  LABORATORY DATA:  Recent Labs  Lab 12/18/22 0626 12/19/22 0118 12/21/22 0111 12/22/22 0233 12/23/22 0310  WBC 7.2 6.9 8.8 9.6 10.4  HGB 7.6* 7.4* 7.9* 7.6* 8.3*  HCT 23.2* 22.7* 23.8* 23.8* 25.8*  PLT 281 294 347 399 454*  MCV 86.9 87.0 87.5 87.8 89.6  MCH 28.5 28.4 29.0 28.0 28.8  MCHC 32.8 32.6 33.2 31.9 32.2  RDW 13.9 13.7 14.1 14.2 14.3  LYMPHSABS  --   --   --  1.7 1.9  MONOABS  --   --   --  1.6* 1.6*  EOSABS  --   --   --  0.4 0.5  BASOSABS  --   --   --  0.1 0.1    Recent Labs  Lab 12/19/22 0118 12/20/22 0447 12/21/22 0111 12/22/22 0233 12/23/22 0310  NA 137 141 141 143 145  K 3.8 3.3* 3.3* 3.2* 3.5  CL 100 105 105 104 105  CO2 24 23 22 24 24   ANIONGAP 13 13 14 15  16*  GLUCOSE 107* 97 92 96 94  BUN 72* 73* 69* 66* 60*  CREATININE 5.58* 5.38* 4.95* 4.57* 4.33*  ALBUMIN  --   --  1.6* 1.7* 1.8*  CRP  --   --   --  14.1* 13.1*  MG  --   --   --  1.5* 2.2  CALCIUM 7.4* 7.2* 7.6* 7.8* 8.1*    RADIOLOGY STUDIES/RESULTS: No results found.   LOS: 20 days   Signature  -    Lala Lund M.D on 12/23/2022 at 9:36 AM   -  To page go to [email or web address]

## 2022-12-23 NOTE — Progress Notes (Signed)
Pharmacy Antibiotic Note  Hunter Reyes is a 46 y.o. male admitted on 12/02/2022 with disseminated MRSA bacteremia and epidural abscess with discitis/osteomyelitis. Pharmacy has been consulted for Daptomycin dosing. Repeat blood culture on 12/14 no growth. ID team following and planning end date 02/03/23 (8 weeks from 12/09/22).  AKI, Daptomycin regimen adjusted from q24h to Q48h on 12/15/22. Catherine placed 12/26 and hemodialysis done.  Creatinine now improving.  12/31: Q48 regimen remains appropriate for renal function, Scr 4.33 today. Resulting in a CrCl of 31.5 mL/min today, patient just slightly above cut off to increase to Q24 hour dosing.  Last dose given 12/30 PM, next dose would be due 12/24/21 at 2000 without adjustment. Patient is afebrile with normal HR and BP. Urine output increasing, indicating possible renal recovery.  CK 134 on 12/28, within normal limits, next measurement 12/27/2021.   Plan: Continue Daptomycin 850 mg (~8 mg/kg adjBW) IV, but adjust dose interval to reflect new renal function --> q24hrs  Dose due at 8pm tonight. Weekly CK on Thursdays, next 12/27/22. Follow renal function for any need to modify regimen back to q48 hour dosing.   Height: 6\' 2"  (188 cm) Weight: 134.1 kg (295 lb 10.2 oz) IBW/kg (Calculated) : 82.2  Temp (24hrs), Avg:97.9 F (36.6 C), Min:97.7 F (36.5 C), Max:98.1 F (36.7 C)  Recent Labs  Lab 12/18/22 0626 12/19/22 0118 12/20/22 0447 12/21/22 0111 12/22/22 0233 12/23/22 0310  WBC 7.2 6.9  --  8.8 9.6 10.4  CREATININE 7.22* 5.58* 5.38* 4.95* 4.57* 4.33*     Estimated Creatinine Clearance: 31.4 mL/min (A) (by C-G formula based on SCr of 4.33 mg/dL (H)).    Allergies  Allergen Reactions   Darvocet [Propoxyphene N-Acetaminophen] Hives and Itching   Tramadol Hives and Itching   Other Hives    Antimicrobials this admission: Ceftriaxone 12/11 x 1 dose Vancomycin 12/11 x 1 dose Cefepime 12/11>>12/12 Metronidazole  12/11>>12/12 Daptomycin 12/13 >>(02/03/23)  CK now QThursday 12/11: 40 12/13: 404 12/14: 201 12/20: 90  12/21: 84 12/28: 134  Microbiology results: 12/03 urine: >100K/ml MRSA and Staph epi 12/10 COVID, flu and RSV: negative 12/11 blood: MRSA 12/11 urine: 20K/ml MRSA 12/13 blood: MRSA and Staph epi 12/13 abscess(lumbar 4 epidural space): rare MRSA  12/14 blood: negative  Thank you for allowing pharmacy to be a part of this patient's care.  Vicenta Dunning, PharmD  PGY1 Pharmacy Resident

## 2022-12-23 NOTE — Progress Notes (Signed)
[facility] Progress Note   Assessment/ Plan:   1. AKI: recurrent, d/t obstructive uropathy. Good UOP.  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  May end up considering biopsy, however doubt would change management (would suspect underlying infection related GN; would treat infection and not immunosuppress in setting of disseminated MRSA) so will not pursue biopsy at this junction especially given that his Cr+UOP are improving - started HD 12/26 for progressive azotema, did not need further treatments  - Cr continues to improve, now seems to be in polyuric phase of renal recovery--watch carefully, replete lytes prn and may need fluids depending on extent of output. Will place orders to d/c RIJ temp dialysis catheter. I am hoping that his tea colored urine/hematuria will resolve as his infection is being treated.  2.  Hyperkalemia:  has improved with medical management and HD  - cont to renal diet  -stop lokelma  -now intermittent hypoK in the context of polyuric phase, replete PRN  3.  Disseminated MRSA>  discitis, osteo multiple locations, bacteremia  - on daptomycin. Will remain inpatient. Per primary service  4.  Anemia:  - being transfused PRN per primary, avoid IV Fe. S/p dose of ESA 12/30  5. Protein cal malnutrition  -push protein  Nothing else to add from a nephrology perspective at this time. Discussed with primary service. Will sign off from a nephrology perspective. Please call with any questions/concerns especially if his kidney function fails to further respond and/or hematuria does not improve with time.  [name], MD [facility] Subjective:    Seen in room - no new issues/complaints. 5.2L urine output   Objective:   BP 135/71 (BP Location: Left Arm)   Pulse 100   Temp 98.1 F (36.7 C) (Axillary)   Resp 17   Ht 6\' 2"  (1.88 m)   Wt 134.1 kg   SpO2  96%   BMI 37.96 kg/m   Intake/Output Summary (Last 24 hours) at 12/23/2022 1121 Last data filed at 12/23/2022 0600 Gross per 24 hour  Intake 767.01 ml  Output 5250 ml  Net -4482.99 ml   Weight change:   Physical Exam: [id number] chronically ill  HEENT: MMM, very poor dentition CVS: RRR. No rub Resp: clear Abd: soft Ext: trace to 1+ pitting edema GU: tea-colored urine, +foley Neuro: awake, alert Dialysis access: RIJ temp HD line c/d/i  Imaging: No results found.  Labs: BMET Recent Labs  Lab 12/17/22 2315 12/18/22 0626 12/19/22 0118 12/20/22 0447 12/21/22 0111 12/22/22 0233 12/23/22 0310  NA 133* 135 137 141 141 143 145  K 4.8 5.0 3.8 3.3* 3.3* 3.2* 3.5  CL 96* 98 100 105 105 104 105  CO2 22 22 24 23 22 24 24   GLUCOSE 109* 100* 107* 97 92 96 94  BUN 99* 101* 72* 73* 69* 66* 60*  CREATININE 7.07* 7.22* 5.58* 5.38* 4.95* 4.57* 4.33*  CALCIUM 7.2* 7.3* 7.4* 7.2* 7.6* 7.8* 8.1*  PHOS  --   --   --   --  6.3* 5.9* 6.2*   CBC Recent Labs  Lab 12/19/22 0118 12/21/22 0111 12/22/22 0233 12/23/22 0310  WBC 6.9 8.8 9.6 10.4  NEUTROABS  --   --  5.6 6.1  HGB 7.4* 7.9* 7.6* 8.3*  HCT 22.7* 23.8* 23.8* 25.8*  MCV 87.0 87.5 87.8 89.6  PLT 294 347 399 454*    Medications:  buprenorphine-naloxone  1 tablet Sublingual BID   Chlorhexidine Gluconate Cloth  6 each Topical Q0600   cyanocobalamin  1,000 mcg Subcutaneous Daily   [START ON 12/26/2022] vitamin B-12  1,000 mcg Oral Daily   docusate sodium  200 mg Oral BID   ferrous sulfate  325 mg Oral BID WC   folic acid  1 mg Oral Daily   heparin injection (subcutaneous)  5,000 Units Subcutaneous Q8H   nutrition supplement (JUVEN)  1 packet Oral BID BM   pantoprazole  40 mg Oral Daily   polyethylene glycol  17 g Oral Daily   sertraline  25 mg Oral Daily   tamsulosin  0.4 mg Oral QPC supper   thiamine  500 mg Oral Daily

## 2022-12-24 DIAGNOSIS — B9562 Methicillin resistant Staphylococcus aureus infection as the cause of diseases classified elsewhere: Secondary | ICD-10-CM | POA: Diagnosis not present

## 2022-12-24 DIAGNOSIS — R7881 Bacteremia: Secondary | ICD-10-CM | POA: Diagnosis not present

## 2022-12-24 LAB — CBC WITH DIFFERENTIAL/PLATELET
Abs Immature Granulocytes: 0.3 10*3/uL — ABNORMAL HIGH (ref 0.00–0.07)
Basophils Absolute: 0.1 10*3/uL (ref 0.0–0.1)
Basophils Relative: 1 %
Eosinophils Absolute: 0.4 10*3/uL (ref 0.0–0.5)
Eosinophils Relative: 4 %
HCT: 25.4 % — ABNORMAL LOW (ref 39.0–52.0)
Hemoglobin: 8 g/dL — ABNORMAL LOW (ref 13.0–17.0)
Immature Granulocytes: 3 %
Lymphocytes Relative: 18 %
Lymphs Abs: 2 10*3/uL (ref 0.7–4.0)
MCH: 28.2 pg (ref 26.0–34.0)
MCHC: 31.5 g/dL (ref 30.0–36.0)
MCV: 89.4 fL (ref 80.0–100.0)
Monocytes Absolute: 1.6 10*3/uL — ABNORMAL HIGH (ref 0.1–1.0)
Monocytes Relative: 15 %
Neutro Abs: 6.4 10*3/uL (ref 1.7–7.7)
Neutrophils Relative %: 59 %
Platelets: 479 10*3/uL — ABNORMAL HIGH (ref 150–400)
RBC: 2.84 MIL/uL — ABNORMAL LOW (ref 4.22–5.81)
RDW: 14.2 % (ref 11.5–15.5)
WBC: 10.9 10*3/uL — ABNORMAL HIGH (ref 4.0–10.5)
nRBC: 0 % (ref 0.0–0.2)

## 2022-12-24 LAB — BASIC METABOLIC PANEL WITH GFR
Anion gap: 15 (ref 5–15)
BUN: 54 mg/dL — ABNORMAL HIGH (ref 6–20)
CO2: 23 mmol/L (ref 22–32)
Calcium: 7.9 mg/dL — ABNORMAL LOW (ref 8.9–10.3)
Chloride: 103 mmol/L (ref 98–111)
Creatinine, Ser: 3.84 mg/dL — ABNORMAL HIGH (ref 0.61–1.24)
GFR, Estimated: 19 mL/min — ABNORMAL LOW
Glucose, Bld: 86 mg/dL (ref 70–99)
Potassium: 3.1 mmol/L — ABNORMAL LOW (ref 3.5–5.1)
Sodium: 141 mmol/L (ref 135–145)

## 2022-12-24 LAB — CK: Total CK: 88 U/L (ref 49–397)

## 2022-12-24 LAB — BRAIN NATRIURETIC PEPTIDE: B Natriuretic Peptide: 102.1 pg/mL — ABNORMAL HIGH (ref 0.0–100.0)

## 2022-12-24 LAB — MAGNESIUM: Magnesium: 1.9 mg/dL (ref 1.7–2.4)

## 2022-12-24 MED ORDER — POTASSIUM CHLORIDE CRYS ER 20 MEQ PO TBCR
40.0000 meq | EXTENDED_RELEASE_TABLET | Freq: Two times a day (BID) | ORAL | Status: AC
  Administered 2022-12-24 (×2): 40 meq via ORAL
  Filled 2022-12-24 (×2): qty 2

## 2022-12-24 NOTE — Progress Notes (Signed)
PROGRESS NOTE        PATIENT DETAILS Name: Hunter Reyes Age: 47 y.o. Sex: male Date of Birth: 11-16-1976 Admit Date: 12/02/2022 Admitting Physician [name], MD ZOX:[id number], Edwinna Areola, MD  Brief Summary: Patient is a 47 y.o.  male with history of bipolar disorder, crack cocaine use, peripheral neuropathy-who was transferred from [facility] for AKI/urinary retention-upon further evaluation-he was found to have MRSA bacteremia with right foot abscess requiring TMA, lumbar discitis/abscess-requiring emergent laminectomy.  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  Significant events: 12/10>> admit to [facility] at AP-AKI-urinary retention.  Plans to transfer to [facility]. 12/13>> laminectomy/decompression of ventral lumbar epidural abscess 12/17>> transmetatarsal amputation of right foot 12/21>>voiding trial-foley removed 12/22>>worsening renal function-urinary retention-foley replaced 12/25>>Urine protein creatinine ratio of 7.68-w hematuria-suggestion of new AKI is 2/2 infection GN 12/26>>Temp HD cath placed by IR-HD started  Significant studies: 12/11>> CT renal stone study: Possible discitis L2-L3/L3-L4, moderate bilateral hydronephrosis/hydroureter 12/13>> MRI L-spine: Discitis/osteomyelitis at L3-L4 with 9 mm thick ventral canal phlegmon/abscess which extends from L3 to L4-L5.  Multiple paraspinal abscesses within the psoas musculature. 12/13>> echo: EF 60-65%, no evidence of valvular vegetations. 12/14>> MRI left foot: No osteomyelitis/abscess 12/15>> MRI right foot: Abscess with osteomyelitis of second/third metatarsals. 12/25>>Urine protein creatinine ratio of 7.68 12/25>> ASO titer: Negative 12/25>> ANA: Negative 12/25>> ANCA titers: Negative 12/25>> GBM antibody: Negative 12/25>> C3/C4: Normal limits. 12/25>> HBV/HCV serology:   Significant microbiology data: 12/10>> COVID/influenza PCR:  Negative 12/11>> blood culture: MRSA 12/13>> blood culture: MRSA 12/13>> epidural abscess culture: MRSA 12/14>> blood culture: No growth  Procedures: 12/13>> right L4 laminectomy-sublaminar decompression of ventral epidural abscess. 12/17>> transmetatarsal amputation of right foot 12/26>>Right IJ Trialysis CVC placed by IR  Consults: ID Neurosurgery IR Renal  Subjective:  Patient in bed, appears comfortable, denies any headache, no fever, no chest pain or pressure, no shortness of breath , no abdominal pain. No new focal weakness.   Objective:  Vitals: Blood pressure 114/63, pulse 79, temperature 97.8 F (36.6 C), temperature source Oral, resp. rate 18, height 6\' 2"  (1.88 m), weight 134.1 kg, SpO2 92 %.   Exam:  Awake Alert, No new F.N deficits, Normal affect .AT,PERRAL Supple Neck, No JVD,   Symmetrical Chest wall movement, Good air movement bilaterally, CTAB RRR,No Gallops, Rubs or new Murmurs,  +ve B.Sounds, Abd Soft, No tenderness,    Right transmetatarsal partial amputation with wound VAC, right IJ dialysis catheter in place, 1+ edema, Foley catheter in place,     Assessment/Plan:  MRSA bacteremia, L3-L4 discitis with epidural abscess-s/p laminectomy/decompression on 12/05/22 - Right foot osteomyelitis/abscess-s/p transmetatarsal amputation on 12/09/22 Overall improved-repeat cultures negative so far Orthopedics/neurosurgery following-able to lift both lower extremities off the bed. Continue to work with PT/OT ID recommending 8 weeks of IV Daptomycin-end date 02/03/2023, repeat MRI L spine on 01/02/23- and to reconsult ID around 1/10 to revisit  antibiotic plan following repeat imaging Given history of drug use-unfortunately will remain inpatient to complete IV antibiotic duration.  Not a candidate for home therapies.   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   Great urine output overnight-nephrology following-on intermittent HD.  Per nephrology dialysis catheter to be removed on 12/24/2022 and monitor renal function which is  serially improving. Continue to follow signs for renal recovery.   Will defer biopsy to nephrology service. Plan to keep Foley catheter in for the time being (failed voiding trial)-will need urology follow-up over the course of the next several weeks as AKI issue evolves. On flomax   Hypokalemia: Replete/recheck.  Normocytic anemia Received 1 unit of packed RBC on 12/17/2022, H&H gradually dropping, anemia panel suggestive of mixed picture of some iron deficiency and relative B12 deficiency.  Placed on replacement.  No signs of bleeding.  On absurd continue to monitor.  Peripheral neuropathy No longer on Neurontin-given worsening AKI.  Bipolar 1 disorder Stable Continue zoloft  Polysubstance abuse History of cocaine use Remains on Suboxone  Dental caries Outpatient dentistry follow-up  Morbid Obesity: Estimated body mass index is 37.96 kg/m as calculated from the following:   Height as of this encounter: 6\' 2"  (1.88 m).   Weight as of this encounter: 134.1 kg.   Code status:   Code Status: Full Code   DVT Prophylaxis: heparin injection 5,000 Units Start: 12/16/22 1000 SCD's Start: 12/09/22 1137   Family Communication: None at bedside   Disposition Plan: Status is: Inpatient Remains inpatient appropriate because: Severity of illness   Planned Discharge Destination:Home ultimately-but will need to remain inpatient to complete IV daptomycin-end date 02/04/20   Diet: Diet Order             Diet renal with fluid restriction Fluid restriction: 1200 mL Fluid; Room service appropriate? Yes; Fluid consistency: Thin  Diet effective now                    MEDICATIONS: Scheduled Meds:  buprenorphine-naloxone  1 tablet Sublingual BID    Chlorhexidine Gluconate Cloth  6 each Topical Q0600   cyanocobalamin  1,000 mcg Subcutaneous Daily   [START ON 12/26/2022] vitamin B-12  1,000 mcg Oral Daily   docusate sodium  200 mg Oral BID   ferrous sulfate  325 mg Oral BID WC   folic acid  1 mg Oral Daily   heparin injection (subcutaneous)  5,000 Units Subcutaneous Q8H   nutrition supplement (JUVEN)  1 packet Oral BID BM   pantoprazole  40 mg Oral Daily   polyethylene glycol  17 g Oral Daily   potassium chloride  40 mEq Oral BID WC   sertraline  25 mg Oral Daily   tamsulosin  0.4 mg Oral QPC supper   thiamine  500 mg Oral Daily   Continuous Infusions:  DAPTOmycin (CUBICIN) 850 mg in sodium chloride 0.9 % IVPB 850 mg (12/23/22 2004)   PRN Meds:.acetaminophen **OR** acetaminophen, alum & mag hydroxide-simeth, bisacodyl, guaiFENesin-dextromethorphan, HYDROmorphone (DILAUDID) injection, ondansetron **OR** ondansetron (ZOFRAN) IV, oxyCODONE, phenol, tiZANidine, traZODone   I have personally reviewed following labs and imaging studies  LABORATORY DATA:  Recent Labs  Lab 12/19/22 0118 12/21/22 0111 12/22/22 0233 12/23/22 0310 12/24/22 0341  WBC 6.9 8.8 9.6 10.4 10.9*  HGB 7.4* 7.9* 7.6* 8.3* 8.0*  HCT 22.7* 23.8* 23.8* 25.8* 25.4*  PLT 294 347 399 454* 479*  MCV 87.0 87.5 87.8 89.6 89.4  MCH 28.4 29.0 28.0 28.8 28.2  MCHC 32.6 33.2 31.9 32.2 31.5  RDW 13.7 14.1 14.2 14.3 14.2  LYMPHSABS  --   --  1.7 1.9 2.0  MONOABS  --   --  1.6* 1.6* 1.6*  EOSABS  --   --  0.4 0.5 0.4  BASOSABS  --   --  0.1 0.1 0.1    Recent Labs  Lab 12/20/22 0447 12/21/22 0111 12/22/22 0233 12/23/22 0310 12/24/22 0341  NA 141 141 143 145 141  K 3.3* 3.3* 3.2* 3.5 3.1*  CL 105 105 104 105 103  CO2 23 22 24 24 23   ANIONGAP 13 14 15  16* 15  GLUCOSE 97 92 96 94 86  BUN 73* 69* 66* 60* 54*  CREATININE 5.38* 4.95* 4.57* 4.33* 3.84*  ALBUMIN  --  1.6* 1.7* 1.8*  --   CRP  --   --  14.1* 13.1*  --   BNP  --   --   --   --  102.1*  MG  --   --   1.5* 2.2 1.9  CALCIUM 7.2* 7.6* 7.8* 8.1* 7.9*    RADIOLOGY STUDIES/RESULTS: No results found.   LOS: 21 days   Signature  -    Lala Lund M.D on 12/24/2022 at 9:45 AM   -  To page go to [email or web address]

## 2022-12-25 DIAGNOSIS — B9562 Methicillin resistant Staphylococcus aureus infection as the cause of diseases classified elsewhere: Secondary | ICD-10-CM | POA: Diagnosis not present

## 2022-12-25 DIAGNOSIS — R7881 Bacteremia: Secondary | ICD-10-CM | POA: Diagnosis not present

## 2022-12-25 LAB — BASIC METABOLIC PANEL
Anion gap: 14 (ref 5–15)
BUN: 47 mg/dL — ABNORMAL HIGH (ref 6–20)
CO2: 24 mmol/L (ref 22–32)
Calcium: 8.4 mg/dL — ABNORMAL LOW (ref 8.9–10.3)
Chloride: 105 mmol/L (ref 98–111)
Creatinine, Ser: 3.68 mg/dL — ABNORMAL HIGH (ref 0.61–1.24)
GFR, Estimated: 20 mL/min — ABNORMAL LOW (ref >60)
Glucose, Bld: 87 mg/dL (ref 70–99)
Potassium: 4.1 mmol/L (ref 3.5–5.1)
Sodium: 143 mmol/L (ref 135–145)

## 2022-12-25 LAB — CBC WITH DIFFERENTIAL/PLATELET
Abs Immature Granulocytes: 0.44 10*3/uL — ABNORMAL HIGH (ref 0.00–0.07)
Basophils Absolute: 0.1 10*3/uL (ref 0.0–0.1)
Basophils Relative: 1 %
Eosinophils Absolute: 0.3 10*3/uL (ref 0.0–0.5)
Eosinophils Relative: 3 %
HCT: 25.2 % — ABNORMAL LOW (ref 39.0–52.0)
Hemoglobin: 8.2 g/dL — ABNORMAL LOW (ref 13.0–17.0)
Immature Granulocytes: 4 %
Lymphocytes Relative: 21 %
Lymphs Abs: 2.4 10*3/uL (ref 0.7–4.0)
MCH: 29.1 pg (ref 26.0–34.0)
MCHC: 32.5 g/dL (ref 30.0–36.0)
MCV: 89.4 fL (ref 80.0–100.0)
Monocytes Absolute: 1.6 10*3/uL — ABNORMAL HIGH (ref 0.1–1.0)
Monocytes Relative: 14 %
Neutro Abs: 6.6 10*3/uL (ref 1.7–7.7)
Neutrophils Relative %: 57 %
Platelets: 431 10*3/uL — ABNORMAL HIGH (ref 150–400)
RBC: 2.82 MIL/uL — ABNORMAL LOW (ref 4.22–5.81)
RDW: 14.2 % (ref 11.5–15.5)
WBC: 11.5 10*3/uL — ABNORMAL HIGH (ref 4.0–10.5)
nRBC: 0 % (ref 0.0–0.2)

## 2022-12-25 LAB — MAGNESIUM: Magnesium: 1.8 mg/dL (ref 1.7–2.4)

## 2022-12-25 LAB — BRAIN NATRIURETIC PEPTIDE: B Natriuretic Peptide: 124 pg/mL — ABNORMAL HIGH (ref 0.0–100.0)

## 2022-12-25 MED ORDER — OXYCODONE HCL 5 MG PO TABS
5.0000 mg | ORAL_TABLET | Freq: Two times a day (BID) | ORAL | Status: DC | PRN
  Administered 2022-12-25 – 2023-01-29 (×41): 5 mg via ORAL
  Filled 2022-12-25 (×41): qty 1

## 2022-12-25 MED ORDER — SODIUM CHLORIDE 0.9 % IV SOLN: 25.0000 mg | Freq: Four times a day (QID) | INTRAVENOUS | Status: DC | PRN

## 2022-12-25 MED ORDER — BUTALBITAL-APAP-CAFFEINE 50-325-40 MG PO TABS
1.0000 | ORAL_TABLET | Freq: Once | ORAL | Status: AC
  Administered 2022-12-25: 1 via ORAL
  Filled 2022-12-25: qty 1

## 2022-12-25 MED ORDER — DIPHENHYDRAMINE HCL 50 MG/ML IJ SOLN
25.0000 mg | Freq: Four times a day (QID) | INTRAMUSCULAR | Status: DC | PRN
  Administered 2023-01-31: 25 mg via INTRAVENOUS
  Filled 2022-12-25: qty 1

## 2022-12-25 NOTE — Progress Notes (Signed)
Physical Therapy Treatment Patient Details Name: Hunter Reyes MRN: [id number] DOB: July 20, 1976 Today's Date: 12/25/2022   History of Present Illness 47 y.o. male admitted 12/10 with AMS, urinary retention and low back pain. Underwent L4 lumbar laminectomy for epidural abscess 12/13. s/p R foot transmet amp 12/17. Pt initiated HD on 12/26. PMH:  bipolar 1 disorder, borderline personality disorder, COPD, crack cocaine abuse, depression, peripheral neuropathy, history of right foot osteomyelitis, panic attack and history of seizures    PT Comments    Pt pleasant and able to transition to chair with lateral scoot with less assist this session. Pt continues to need cues and direction to maintain NWB RLE and assist to manage VAC lines. Pt educated for HEP and encouraged to be OOB daily. Plan appropriate.     Recommendations for follow up therapy are one component of a multi-disciplinary discharge planning process, led by the attending physician.  Recommendations may be updated based on patient status, additional functional criteria and insurance authorization.  Follow Up Recommendations  Skilled nursing-short term rehab (<3 hours/day) Can patient physically be transported by private vehicle: No   Assistance Recommended at Discharge Frequent or constant Supervision/Assistance  Patient can return home with the following Assistance with cooking/housework;Assist for transportation;Help with stairs or ramp for entrance;A lot of help with walking and/or transfers;A little help with bathing/dressing/bathroom   Equipment Recommendations  Rolling walker (2 wheels);Wheelchair (measurements PT)    Recommendations for Other Services       Precautions / Restrictions Precautions Precautions: Fall;Back Precaution Comments: watch HR, incontinent stool Other Brace: post op shoe- not present during session Restrictions RLE Weight Bearing: Non weight bearing     Mobility  Bed Mobility   Bed  Mobility: Rolling, Sidelying to Sit Rolling: Supervision Sidelying to sit: Supervision       General bed mobility comments: supervision with cues for sequence with use of rail. Pt able to rise to sitting with assist for lines and cues for safety    Transfers Overall transfer level: Needs assistance   Transfers: Bed to chair/wheelchair/BSC            Lateral/Scoot Transfers: Min guard General transfer comment: minguard to perform lateral transfer from bed to drop arm recliner with pt's Rt foot supported on P.T. foot to prevent weight bearing. Increased time and effort to complete transfer    Ambulation/Gait               General Gait Details: unable to safely attempt   Stairs             Wheelchair Mobility    Modified Rankin (Stroke Patients Only)       Balance Overall balance assessment: Needs assistance   Sitting balance-[name] Scale: Good Sitting balance - Comments: sitting without support                                    Cognition Arousal/Alertness: Awake/alert Behavior During Therapy: Flat affect Overall Cognitive Status: No family/caregiver present to determine baseline cognitive functioning                   Orientation Level: Disoriented to, Time Current Attention Level: Sustained Memory: Decreased recall of precautions Following Commands: Follows one step commands with increased time Safety/Judgement: Decreased awareness of safety   Problem Solving: Slow processing          Exercises General Exercises - Lower Extremity Long Arc  Quad: AROM, Both, Seated, 20 reps Hip Flexion/Marching: AROM, Both, 20 reps, Seated    General Comments        Pertinent Vitals/Pain Pain Assessment Pain Score: 3  Pain Location: back    Home Living                          Prior Function            PT Goals (current goals can now be found in the care plan section) Acute Rehab PT Goals Time For Goal  Achievement: 01/08/23 Potential to Achieve Goals: Fair Progress towards PT goals: Progressing toward goals    Frequency    Min 2X/week      PT Plan Current plan remains appropriate    Co-evaluation              AM-PAC PT "6 Clicks" Mobility   Outcome Measure  Help needed turning from your back to your side while in a flat bed without using bedrails?: None Help needed moving from lying on your back to sitting on the side of a flat bed without using bedrails?: A Little Help needed moving to and from a bed to a chair (including a wheelchair)?: A Little Help needed standing up from a chair using your arms (e.g., wheelchair or bedside chair)?: Total Help needed to walk in hospital room?: Total Help needed climbing 3-5 steps with a railing? : Total 6 Click Score: 13    End of Session   Activity Tolerance: Patient tolerated treatment well Patient left: with call bell/phone within reach;in chair;with chair alarm set Nurse Communication: Mobility status PT Visit Diagnosis: Muscle weakness (generalized) (M62.81);Pain;Difficulty in walking, not elsewhere classified (R26.2)     Time: 1610-9604 PT Time Calculation (min) (ACUTE ONLY): 20 min  Charges:  $Therapeutic Activity: 8-22 mins                     Bayard Males, PT Acute Rehabilitation Services Office: [place] 12/25/2022, 10:42 AM

## 2022-12-25 NOTE — Plan of Care (Signed)
  Problem: Fluid Volume: Goal: Hemodynamic stability will improve Outcome: Progressing   Problem: Clinical Measurements: Goal: Diagnostic test results will improve Outcome: Progressing Goal: Signs and symptoms of infection will decrease Outcome: Progressing   Problem: Respiratory: Goal: Ability to maintain adequate ventilation will improve Outcome: Progressing   Problem: Education: Goal: Knowledge of General Education information will improve Description: Including pain rating scale, medication(s)/side effects and non-pharmacologic comfort measures Outcome: Progressing   Problem: Health Behavior/Discharge Planning: Goal: Ability to manage health-related needs will improve Outcome: Progressing   Problem: Clinical Measurements: Goal: Ability to maintain clinical measurements within normal limits will improve Outcome: Progressing Goal: Will remain free from infection Outcome: Progressing Goal: Diagnostic test results will improve Outcome: Progressing Goal: Respiratory complications will improve Outcome: Progressing Goal: Cardiovascular complication will be avoided Outcome: Progressing   Problem: Activity: Goal: Risk for activity intolerance will decrease Outcome: Progressing   Problem: Nutrition: Goal: Adequate nutrition will be maintained Outcome: Progressing   Problem: Coping: Goal: Level of anxiety will decrease Outcome: Progressing   Problem: Elimination: Goal: Will not experience complications related to bowel motility Outcome: Progressing Goal: Will not experience complications related to urinary retention Outcome: Progressing   Problem: Pain Managment: Goal: General experience of comfort will improve Outcome: Progressing   Problem: Safety: Goal: Ability to remain free from injury will improve Outcome: Progressing   Problem: Skin Integrity: Goal: Risk for impaired skin integrity will decrease Outcome: Progressing   Problem: Education: Goal:  Knowledge of the prescribed therapeutic regimen will improve Outcome: Progressing Goal: Ability to verbalize activity precautions or restrictions will improve Outcome: Progressing Goal: Understanding of discharge needs will improve Outcome: Progressing   Problem: Activity: Goal: Ability to perform//tolerate increased activity and mobilize with assistive devices will improve Outcome: Progressing   Problem: Clinical Measurements: Goal: Postoperative complications will be avoided or minimized Outcome: Progressing   Problem: Self-Care: Goal: Ability to meet self-care needs will improve Outcome: Progressing   Problem: Self-Concept: Goal: Ability to maintain and perform role responsibilities to the fullest extent possible will improve Outcome: Progressing   Problem: Pain Management: Goal: Pain level will decrease with appropriate interventions Outcome: Progressing   Problem: Skin Integrity: Goal: Demonstration of wound healing without infection will improve Outcome: Progressing

## 2022-12-25 NOTE — Progress Notes (Signed)
Patient was transported with belongings to [place].  *late entry

## 2022-12-25 NOTE — Progress Notes (Addendum)
CSW spoke with [facility] [phone]) of [employer] (managing entity of patient's Medicaid) who states she is a part of the utilization review team following this patient. Misty had questions about placement efforts and current plan of care. All questions were answered and Misty states the patient's clinicals would be reviewed by the medical director.  Madilyn Fireman, MSW, LCSW Transitions of Care  Clinical Social Worker II [phone]

## 2022-12-25 NOTE — Progress Notes (Signed)
PROGRESS NOTE        PATIENT DETAILS Name: Hunter Reyes Age: 47 y.o. Sex: male Date of Birth: May 22, 1976 Admit Date: 12/02/2022 Admitting Physician [name], MD PZW:CHEN, Edwinna Areola, MD  Brief Summary: Patient is a 47 y.o.  male with history of bipolar disorder, crack cocaine use, peripheral neuropathy-who was transferred from [facility] for AKI/urinary retention-upon further evaluation-he was found to have MRSA bacteremia with right foot abscess requiring TMA, lumbar discitis/abscess-requiring emergent laminectomy.  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  Significant events: 12/10>> admit to [facility] at [facility]-AKI-urinary retention.  Plans to transfer to [facility]. 12/13>> laminectomy/decompression of ventral lumbar epidural abscess 12/17>> transmetatarsal amputation of right foot 12/21>>voiding trial-foley removed 12/22>>worsening renal function-urinary retention-foley replaced 12/25>>Urine protein creatinine ratio of 7.68-w hematuria-suggestion of new AKI is 2/2 infection GN 12/26>>Temp HD cath placed by IR-HD started  Significant studies: 12/11>> CT renal stone study: Possible discitis L2-L3/L3-L4, moderate bilateral hydronephrosis/hydroureter 12/13>> MRI L-spine: Discitis/osteomyelitis at L3-L4 with 9 mm thick ventral canal phlegmon/abscess which extends from L3 to L4-L5.  Multiple paraspinal abscesses within the psoas musculature. 12/13>> echo: EF 60-65%, no evidence of valvular vegetations. 12/14>> MRI left foot: No osteomyelitis/abscess 12/15>> MRI right foot: Abscess with osteomyelitis of second/third metatarsals. 12/25>>Urine protein creatinine ratio of 7.68 12/25>> ASO titer: Negative 12/25>> ANA: Negative 12/25>> ANCA titers: Negative 12/25>> GBM antibody: Negative 12/25>> C3/C4: Normal limits. 12/25>> HBV/HCV serology:   Significant microbiology data: 12/10>> COVID/influenza PCR:  Negative 12/11>> blood culture: MRSA 12/13>> blood culture: MRSA 12/13>> epidural abscess culture: MRSA 12/14>> blood culture: No growth  Procedures: 12/13>> right L4 laminectomy-sublaminar decompression of ventral epidural abscess. 12/17>> transmetatarsal amputation of right foot 12/26>>Right IJ Trialysis CVC placed by IR  Consults: ID Neurosurgery IR Renal  Subjective:  Patient in bed, appears comfortable, mild +ve headache, no fever, no chest pain or pressure, no shortness of breath , no abdominal pain. No new focal weakness.    Objective:  Vitals: Blood pressure 125/67, pulse 82, temperature 98.2 F (36.8 C), temperature source Oral, resp. rate 19, height 6\' 2"  (1.88 m), weight 134.1 kg, SpO2 96 %.   Exam:  Awake Alert, No new F.N deficits, Normal affect [place].AT,PERRAL Supple Neck, No JVD,   Symmetrical Chest wall movement, Good air movement bilaterally, CTAB RRR,No Gallops, Rubs or new Murmurs,  +ve B.Sounds, Abd Soft, No tenderness,    Right transmetatarsal partial amputation with wound VAC, right IJ dialysis catheter in place, 1+ edema, Foley catheter in place,     Assessment/Plan:  MRSA bacteremia, L3-L4 discitis with epidural abscess-s/p laminectomy/decompression on 12/05/22 - Right foot osteomyelitis/abscess-s/p transmetatarsal amputation on 12/09/22 Overall improved-repeat cultures negative so far Orthopedics/neurosurgery following-able to lift both lower extremities off the bed. Continue to work with PT/OT ID recommending 8 weeks of IV Daptomycin-end date 02/03/2023, repeat MRI L spine on 01/02/23- and to reconsult ID around 1/10 to revisit  antibiotic plan following repeat imaging Given history of drug use-unfortunately will remain inpatient to complete IV antibiotic duration.  Not a candidate for home therapies.   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   Great urine output overnight-nephrology following-on intermittent HD.  Per nephrology dialysis catheter to be removed on 12/24/2022 ? and monitor renal function  which is serially improving. Continue to follow signs for renal recovery.   Will defer biopsy to nephrology service. Plan to keep Foley catheter in for the time being (failed voiding trial)-will need urology follow-up over the course of the next several weeks as AKI issue evolves. On flomax   Hypokalemia: Replete/recheck.  Normocytic anemia Received 1 unit of packed RBC on 12/17/2022, H&H gradually dropping, anemia panel suggestive of mixed picture of some iron deficiency and relative B12 deficiency.  Placed on replacement.  No signs of bleeding.  On absurd continue to monitor.  Peripheral neuropathy No longer on Neurontin-given worsening AKI.  Bipolar 1 disorder Stable Continue zoloft  Polysubstance abuse History of cocaine use Remains on Suboxone  Dental caries Outpatient dentistry follow-up  Morbid Obesity: Estimated body mass index is 37.96 kg/m as calculated from the following:   Height as of this encounter: 6\' 2"  (1.88 m).   Weight as of this encounter: 134.1 kg.   Code status:   Code Status: Full Code   DVT Prophylaxis: heparin injection 5,000 Units Start: 12/16/22 1000 SCD's Start: 12/09/22 1137   Family Communication: None at bedside   Disposition Plan: Status is: Inpatient Remains inpatient appropriate because: Severity of illness   Planned Discharge Destination:Home ultimately-but will need to remain inpatient to complete IV daptomycin-end date 02/04/20   Diet: Diet Order             Diet renal with fluid restriction Fluid restriction: 1200 mL Fluid; Room service appropriate? Yes; Fluid consistency: Thin  Diet effective now                    MEDICATIONS: Scheduled Meds:  buprenorphine-naloxone  1 tablet Sublingual BID    Chlorhexidine Gluconate Cloth  6 each Topical Q0600   cyanocobalamin  1,000 mcg Subcutaneous Daily   [START ON 12/26/2022] vitamin B-12  1,000 mcg Oral Daily   docusate sodium  200 mg Oral BID   ferrous sulfate  325 mg Oral BID WC   folic acid  1 mg Oral Daily   heparin injection (subcutaneous)  5,000 Units Subcutaneous Q8H   nutrition supplement (JUVEN)  1 packet Oral BID BM   pantoprazole  40 mg Oral Daily   polyethylene glycol  17 g Oral Daily   sertraline  25 mg Oral Daily   tamsulosin  0.4 mg Oral QPC supper   thiamine  500 mg Oral Daily   Continuous Infusions:  DAPTOmycin (CUBICIN) 850 mg in sodium chloride 0.9 % IVPB 850 mg (12/24/22 2209)   PRN Meds:.alum & mag hydroxide-simeth, bisacodyl, diphenhydrAMINE, guaiFENesin-dextromethorphan, ondansetron **OR** ondansetron (ZOFRAN) IV, oxyCODONE, phenol, tiZANidine, traZODone   I have personally reviewed following labs and imaging studies  LABORATORY DATA:  Recent Labs  Lab 12/21/22 0111 12/22/22 0233 12/23/22 0310 12/24/22 0341 12/25/22 0342  WBC 8.8 9.6 10.4 10.9* 11.5*  HGB 7.9* 7.6* 8.3* 8.0* 8.2*  HCT 23.8* 23.8* 25.8* 25.4* 25.2*  PLT 347 399 454* 479* 431*  MCV 87.5 87.8 89.6 89.4 89.4  MCH 29.0 28.0 28.8 28.2 29.1  MCHC 33.2 31.9 32.2 31.5 32.5  RDW 14.1 14.2 14.3 14.2 14.2  LYMPHSABS  --  1.7 1.9 2.0 2.4  MONOABS  --  1.6* 1.6* 1.6* 1.6*  EOSABS  --  0.4 0.5 0.4 0.3  BASOSABS  --  0.1 0.1 0.1 0.1    Recent Labs  Lab 12/21/22 0111 12/22/22 0233 12/23/22 0310 12/24/22 0341 12/25/22 0342  NA 141 143 145 141 143  K  3.3* 3.2* 3.5 3.1* 4.1  CL 105 104 105 103 105  CO2 22 24 24 23 24   ANIONGAP 14 15 16* 15 14  GLUCOSE 92 96 94 86 87  BUN 69* 66* 60* 54* 47*  CREATININE 4.95* 4.57* 4.33* 3.84* 3.68*  ALBUMIN 1.6* 1.7* 1.8*  --   --   CRP  --  14.1* 13.1*  --   --   BNP  --   --   --  102.1* 124.0*  MG  --  1.5* 2.2 1.9 1.8  CALCIUM 7.6* 7.8* 8.1* 7.9* 8.4*    RADIOLOGY STUDIES/RESULTS: No results  found.   LOS: 22 days   Signature  -    Lala Lund M.D on 12/25/2022 at 8:56 AM   -  To page go to [email or web address]

## 2022-12-26 DIAGNOSIS — B9562 Methicillin resistant Staphylococcus aureus infection as the cause of diseases classified elsewhere: Secondary | ICD-10-CM | POA: Diagnosis not present

## 2022-12-26 DIAGNOSIS — R7881 Bacteremia: Secondary | ICD-10-CM | POA: Diagnosis not present

## 2022-12-26 LAB — CBC WITH DIFFERENTIAL/PLATELET
Abs Immature Granulocytes: 0 10*3/uL (ref 0.00–0.07)
Basophils Absolute: 0.3 10*3/uL — ABNORMAL HIGH (ref 0.0–0.1)
Basophils Relative: 2 %
Eosinophils Absolute: 0.3 10*3/uL (ref 0.0–0.5)
Eosinophils Relative: 2 %
HCT: 24.9 % — ABNORMAL LOW (ref 39.0–52.0)
Hemoglobin: 8.2 g/dL — ABNORMAL LOW (ref 13.0–17.0)
Lymphocytes Relative: 23 %
Lymphs Abs: 2.9 10*3/uL (ref 0.7–4.0)
MCH: 29.1 pg (ref 26.0–34.0)
MCHC: 32.9 g/dL (ref 30.0–36.0)
MCV: 88.3 fL (ref 80.0–100.0)
Monocytes Absolute: 1.6 10*3/uL — ABNORMAL HIGH (ref 0.1–1.0)
Monocytes Relative: 13 %
Neutro Abs: 7.6 10*3/uL (ref 1.7–7.7)
Neutrophils Relative %: 60 %
Platelets: 431 10*3/uL — ABNORMAL HIGH (ref 150–400)
RBC: 2.82 MIL/uL — ABNORMAL LOW (ref 4.22–5.81)
RDW: 14.1 % (ref 11.5–15.5)
WBC: 12.6 10*3/uL — ABNORMAL HIGH (ref 4.0–10.5)
nRBC: 0 % (ref 0.0–0.2)
nRBC: 0 /100 WBC

## 2022-12-26 LAB — BASIC METABOLIC PANEL
Anion gap: 13 (ref 5–15)
BUN: 45 mg/dL — ABNORMAL HIGH (ref 6–20)
CO2: 27 mmol/L (ref 22–32)
Calcium: 8 mg/dL — ABNORMAL LOW (ref 8.9–10.3)
Chloride: 102 mmol/L (ref 98–111)
Creatinine, Ser: 3.55 mg/dL — ABNORMAL HIGH (ref 0.61–1.24)
GFR, Estimated: 21 mL/min — ABNORMAL LOW (ref >60)
Glucose, Bld: 89 mg/dL (ref 70–99)
Potassium: 3.1 mmol/L — ABNORMAL LOW (ref 3.5–5.1)
Sodium: 142 mmol/L (ref 135–145)

## 2022-12-26 LAB — BRAIN NATRIURETIC PEPTIDE: B Natriuretic Peptide: 46.3 pg/mL (ref 0.0–100.0)

## 2022-12-26 LAB — MAGNESIUM: Magnesium: 1.6 mg/dL — ABNORMAL LOW (ref 1.7–2.4)

## 2022-12-26 MED ORDER — POTASSIUM CHLORIDE CRYS ER 20 MEQ PO TBCR
40.0000 meq | EXTENDED_RELEASE_TABLET | Freq: Two times a day (BID) | ORAL | Status: AC
  Administered 2022-12-26 (×2): 40 meq via ORAL
  Filled 2022-12-26 (×2): qty 2

## 2022-12-26 MED ORDER — MAGNESIUM SULFATE 4 GM/100ML IV SOLN
4.0000 g | Freq: Once | INTRAVENOUS | Status: AC
  Administered 2022-12-26: 4 g via INTRAVENOUS
  Filled 2022-12-26: qty 100

## 2022-12-26 NOTE — Progress Notes (Signed)
PROGRESS NOTE        PATIENT DETAILS Name: Hunter Reyes Age: 47 y.o. Sex: male Date of Birth: 11/25/1976 Admit Date: 12/02/2022 Admitting Physician [name], MD SWN:IOEV, Edwinna Areola, MD  Brief Summary: Patient is a 47 y.o.  male with history of bipolar disorder, crack cocaine use, peripheral neuropathy-who was transferred from [facility] for AKI/urinary retention-upon further evaluation-he was found to have MRSA bacteremia with right foot abscess requiring TMA, lumbar discitis/abscess-requiring emergent laminectomy.  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  Significant events: 12/10>> admit to [facility] at AP-AKI-urinary retention.  Plans to transfer to [facility]. 12/13>> laminectomy/decompression of ventral lumbar epidural abscess 12/17>> transmetatarsal amputation of right foot 12/21>>voiding trial-foley removed 12/22>>worsening renal function-urinary retention-foley replaced 12/25>>Urine protein creatinine ratio of 7.68-w hematuria-suggestion of new AKI is 2/2 infection GN 12/26>>Temp HD cath placed by IR-HD started  Significant studies: 12/11>> CT renal stone study: Possible discitis L2-L3/L3-L4, moderate bilateral hydronephrosis/hydroureter 12/13>> MRI L-spine: Discitis/osteomyelitis at L3-L4 with 9 mm thick ventral canal phlegmon/abscess which extends from L3 to L4-L5.  Multiple paraspinal abscesses within the psoas musculature. 12/13>> echo: EF 60-65%, no evidence of valvular vegetations. 12/14>> MRI left foot: No osteomyelitis/abscess 12/15>> MRI right foot: Abscess with osteomyelitis of second/third metatarsals. 12/25>>Urine protein creatinine ratio of 7.68 12/25>> ASO titer: Negative 12/25>> ANA: Negative 12/25>> ANCA titers: Negative 12/25>> GBM antibody: Negative 12/25>> C3/C4: Normal limits. 12/25>> HBV/HCV serology:   Significant microbiology data: 12/10>> COVID/influenza PCR:  Negative 12/11>> blood culture: MRSA 12/13>> blood culture: MRSA 12/13>> epidural abscess culture: MRSA 12/14>> blood culture: No growth  Procedures: 12/13>> right L4 laminectomy-sublaminar decompression of ventral epidural abscess. 12/17>> transmetatarsal amputation of right foot 12/26>>Right IJ Trialysis CVC placed by IR  Consults: ID Neurosurgery IR Renal  Subjective:  Patient in bed, appears comfortable, mild +ve headache, no fever, no chest pain or pressure, no shortness of breath , no abdominal pain. No new focal weakness.    Objective:  Vitals: Blood pressure 116/62, pulse 72, temperature 98.4 F (36.9 C), resp. rate 17, height 6\' 2"  (1.88 m), weight 134.1 kg, SpO2 97 %.   Exam:  Awake Alert, No new F.N deficits, Normal affect [place].AT,PERRAL Supple Neck, No JVD,   Symmetrical Chest wall movement, Good air movement bilaterally, CTAB RRR,No Gallops, Rubs or new Murmurs,  +ve B.Sounds, Abd Soft, No tenderness,   Right transmetatarsal partial amputation with wound VAC, 1+ edema, Foley catheter in place,     Assessment/Plan:  MRSA bacteremia, L3-L4 discitis with epidural abscess-s/p laminectomy/decompression on 12/05/22 - Right foot osteomyelitis/abscess-s/p transmetatarsal amputation on 12/09/22  -   Overall improved-repeat cultures negative so far, Orthopedics/neurosurgery following-able to lift both lower extremities off the bed. Continue to work with PT/OT,   ID recommending 8 weeks of IV Daptomycin - end date 02/03/2023, repeat MRI L spine on 01/02/23- and to reconsult ID around 1/10 to revisit  antibiotic plan following repeat imaging Given history of drug use-unfortunately will remain inpatient to complete IV antibiotic duration.  Not a candidate for home therapies.   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 Great urine output overnight-nephrology following-on intermittent HD.  Per nephrology dialysis catheter was removed on 12/25/2022, monitor renal function which is serially improving. Continue to  follow signs for renal recovery.  Continue Foley catheter for close monitoring along with Flomax.     Hypokalemia, hypomagnesemia: Replete/recheck.  Normocytic anemia Received 1 unit of packed RBC on 12/17/2022, H&H gradually dropping, anemia panel suggestive of mixed picture of some iron deficiency and relative B12 deficiency.  Placed on replacement.  No signs of bleeding.  On absurd continue to monitor.  Peripheral neuropathy No longer on Neurontin-given worsening AKI.  Bipolar 1 disorder Stable Continue zoloft  Polysubstance abuse History of cocaine use Remains on Suboxone  Dental caries Outpatient dentistry follow-up  Morbid Obesity: Estimated body mass index is 37.96 kg/m as calculated from the following:   Height as of this encounter: 6\' 2"  (1.88 m).   Weight as of this encounter: 134.1 kg.   Code status:   Code Status: Full Code   DVT Prophylaxis: heparin injection 5,000 Units Start: 12/16/22 1000 SCD's Start: 12/09/22 1137   Family Communication: None at bedside   Disposition Plan: Status is: Inpatient Remains inpatient appropriate because: Severity of illness   Planned Discharge Destination:Home ultimately-but will need to remain inpatient to complete IV daptomycin-end date 02/04/20   Diet: Diet Order             Diet renal with fluid restriction Fluid restriction: 1200 mL Fluid; Room service appropriate? Yes; Fluid consistency: Thin  Diet effective now                    MEDICATIONS: Scheduled Meds:  buprenorphine-naloxone  1 tablet Sublingual BID   Chlorhexidine Gluconate Cloth  6 each Topical Q0600   vitamin B-12  1,000 mcg Oral Daily   docusate sodium  200 mg Oral BID   ferrous sulfate  325 mg Oral BID WC   folic acid  1 mg Oral Daily    heparin injection (subcutaneous)  5,000 Units Subcutaneous Q8H   nutrition supplement (JUVEN)  1 packet Oral BID BM   pantoprazole  40 mg Oral Daily   polyethylene glycol  17 g Oral Daily   potassium chloride  40 mEq Oral BID WC   sertraline  25 mg Oral Daily   tamsulosin  0.4 mg Oral QPC supper   thiamine  500 mg Oral Daily   Continuous Infusions:  DAPTOmycin (CUBICIN) 850 mg in sodium chloride 0.9 % IVPB 850 mg (12/25/22 2123)   PRN Meds:.alum & mag hydroxide-simeth, bisacodyl, diphenhydrAMINE, guaiFENesin-dextromethorphan, ondansetron **OR** ondansetron (ZOFRAN) IV, oxyCODONE, phenol, tiZANidine, traZODone   I have personally reviewed following labs and imaging studies  LABORATORY DATA:  Recent Labs  Lab 12/22/22 0233 12/23/22 0310 12/24/22 0341 12/25/22 0342 12/26/22 0209  WBC 9.6 10.4 10.9* 11.5* 12.6*  HGB 7.6* 8.3* 8.0* 8.2* 8.2*  HCT 23.8* 25.8* 25.4* 25.2* 24.9*  PLT 399 454* 479* 431* 431*  MCV 87.8 89.6 89.4 89.4 88.3  MCH 28.0 28.8 28.2 29.1 29.1  MCHC 31.9 32.2 31.5 32.5 32.9  RDW 14.2 14.3 14.2 14.2 14.1  LYMPHSABS 1.7 1.9 2.0 2.4 2.9  MONOABS 1.6* 1.6* 1.6* 1.6* 1.6*  EOSABS 0.4 0.5 0.4 0.3 0.3  BASOSABS 0.1 0.1 0.1 0.1 0.3*    Recent Labs  Lab 12/21/22 0111 12/22/22 0233 12/23/22 0310 12/24/22 0341 12/25/22 0342 12/26/22 0209  NA 141 143 145 141 143 142  K 3.3* 3.2* 3.5 3.1* 4.1 3.1*  CL 105 104 105 103 105 102  CO2 22 24 24 23 24 27   ANIONGAP 14 15 16* 15 14 13   GLUCOSE 92 96 94 86 87  89  BUN 69* 66* 60* 54* 47* 45*  CREATININE 4.95* 4.57* 4.33* 3.84* 3.68* 3.55*  ALBUMIN 1.6* 1.7* 1.8*  --   --   --   CRP  --  14.1* 13.1*  --   --   --   BNP  --   --   --  102.1* 124.0* 46.3  MG  --  1.5* 2.2 1.9 1.8 1.6*  CALCIUM 7.6* 7.8* 8.1* 7.9* 8.4* 8.0*    RADIOLOGY STUDIES/RESULTS: No results found.   LOS: 23 days   Signature  -    Lala Lund M.D on 12/26/2022 at 8:02 AM   -  To page go to [email or web address]

## 2022-12-26 NOTE — Progress Notes (Signed)
Occupational Therapy Treatment Patient Details Name: Hunter Reyes MRN: [id number] DOB: 05/04/1976 Today's Date: 12/26/2022   History of present illness 47 y.o. male admitted 12/10 with AMS, urinary retention and low back pain. Underwent L4 lumbar laminectomy for epidural abscess 12/13. s/p R foot transmet amp 12/17. Pt initiated HD on 12/26. PMH:  bipolar 1 disorder, borderline personality disorder, COPD, crack cocaine abuse, depression, peripheral neuropathy, history of right foot osteomyelitis, panic attack and history of seizures   OT comments  Reinforced back precautions during bed mobility. Supervision for rolling and side to sit, min assist for sit to sidelying. Pt able to scoot toward HOB, simulated lateral transfer with min assist with use of bed pad to advance hips. Pt completed grooming seated at EOB. Agreed to get OOB to drop arm chair, but mid transfer he determined he needed to have a BM. Placed pt on bed pan, NT notified.   Recommendations for follow up therapy are one component of a multi-disciplinary discharge planning process, led by the attending physician.  Recommendations may be updated based on patient status, additional functional criteria and insurance authorization.    Follow Up Recommendations  Skilled nursing-short term rehab (<3 hours/day)     Assistance Recommended at Discharge Intermittent Supervision/Assistance  Patient can return home with the following  A lot of help with bathing/dressing/bathroom;Assistance with cooking/housework;Help with stairs or ramp for entrance;Assist for transportation;A lot of help with walking and/or transfers   Equipment Recommendations  Wheelchair (measurements OT);Wheelchair cushion (measurements OT);Other (comment) (bari drop arm commode)    Recommendations for Other Services      Precautions / Restrictions Precautions Precautions: Fall;Back Restrictions Weight Bearing Restrictions: Yes RLE Weight Bearing: Non weight  bearing       Mobility Bed Mobility Overal bed mobility: Needs Assistance Bed Mobility: Rolling, Sidelying to Sit, Sit to Sidelying Rolling: Supervision Sidelying to sit: Supervision     Sit to sidelying: Min assist General bed mobility comments: cues for technique, use of rail, assist for LEs back into bed    Transfers                   General transfer comment: Pt scooting toward HOB to reposition with min assist of bed pad under hips. Began transfer to drop arm recliner when pt stated he needed to have BM.     Balance Overall balance assessment: Needs assistance   Sitting balance-[name] Scale: Good                                     ADL either performed or assessed with clinical judgement   ADL Overall ADL's : Needs assistance/impaired     Grooming: Wash/dry hands;Wash/dry face;Sitting                     Armed forces technical officer Details (indicate cue type and reason): rolled for placement of bed pan Toileting- Clothing Manipulation and Hygiene: Total assistance;Bed level         General ADL Comments: Verbal cues for log roll technique and to avoid pulling into long sitting in bed.    Extremity/Trunk Assessment              Vision       Perception     Praxis      Cognition Arousal/Alertness: Awake/alert Behavior During Therapy: Flat affect Overall Cognitive Status: No family/caregiver present to determine baseline cognitive functioning Area  of Impairment: Attention, Problem solving, Memory                   Current Attention Level: Sustained Memory: Decreased recall of precautions       Problem Solving: Slow processing General Comments: Pt stating he had already bathed and dressed today without help, pt in soiled gown and needing assistance for pericare and change of bed pad.        Exercises      Shoulder Instructions       General Comments      Pertinent Vitals/ Pain       Pain Assessment Pain  Assessment: No/denies pain  Home Living                                          Prior Functioning/Environment              Frequency  Min 2X/week        Progress Toward Goals  OT Goals(current goals can now be found in the care plan section)  Progress towards OT goals: Progressing toward goals  Acute Rehab OT Goals OT Goal Formulation: With patient Time For Goal Achievement: 01/09/23 Potential to Achieve Goals: Fair ADL Goals Pt Will Perform Grooming: with set-up;sitting Pt Will Perform Lower Body Dressing: with modified independence;sitting/lateral leans Pt Will Transfer to Toilet: with transfer board;with modified independence Additional ADL Goal #1: Patient will maintain NWB on RLE and spinal precautions throughout ADLs.  Plan Discharge plan remains appropriate    Co-evaluation                 AM-PAC OT "6 Clicks" Daily Activity     Outcome Measure   Help from another person eating meals?: None Help from another person taking care of personal grooming?: A Little Help from another person toileting, which includes using toliet, bedpan, or urinal?: Total Help from another person bathing (including washing, rinsing, drying)?: A Lot Help from another person to put on and taking off regular upper body clothing?: A Little Help from another person to put on and taking off regular lower body clothing?: A Lot 6 Click Score: 15    End of Session    OT Visit Diagnosis: Unsteadiness on feet (R26.81);Muscle weakness (generalized) (M62.81);Pain;Other symptoms and signs involving cognitive function;Other abnormalities of gait and mobility (R26.89)   Activity Tolerance Patient tolerated treatment well   Patient Left in bed;with call bell/phone within reach;with bed alarm set   Nurse Communication Other (comment) (left on bedpan, NT aware)        Time: 0623-7628 OT Time Calculation (min): 15 min  Charges: OT General Charges $OT Visit: 1  Visit OT Treatments $Self Care/Home Management : 8-22 mins  Cleta Alberts, OTR/L Acute Rehabilitation Services Office: [phone]   Malka So 12/26/2022, 3:27 PM

## 2022-12-27 DIAGNOSIS — B9562 Methicillin resistant Staphylococcus aureus infection as the cause of diseases classified elsewhere: Secondary | ICD-10-CM | POA: Diagnosis not present

## 2022-12-27 DIAGNOSIS — R7881 Bacteremia: Secondary | ICD-10-CM | POA: Diagnosis not present

## 2022-12-27 LAB — CBC WITH DIFFERENTIAL/PLATELET
Abs Immature Granulocytes: 0.71 10*3/uL — ABNORMAL HIGH (ref 0.00–0.07)
Basophils Absolute: 0.1 10*3/uL (ref 0.0–0.1)
Basophils Relative: 1 %
Eosinophils Absolute: 0.5 10*3/uL (ref 0.0–0.5)
Eosinophils Relative: 4 %
HCT: 26.3 % — ABNORMAL LOW (ref 39.0–52.0)
Hemoglobin: 8.1 g/dL — ABNORMAL LOW (ref 13.0–17.0)
Immature Granulocytes: 5 %
Lymphocytes Relative: 18 %
Lymphs Abs: 2.5 10*3/uL (ref 0.7–4.0)
MCH: 28 pg (ref 26.0–34.0)
MCHC: 30.8 g/dL (ref 30.0–36.0)
MCV: 91 fL (ref 80.0–100.0)
Monocytes Absolute: 1.9 10*3/uL — ABNORMAL HIGH (ref 0.1–1.0)
Monocytes Relative: 14 %
Neutro Abs: 8.3 10*3/uL — ABNORMAL HIGH (ref 1.7–7.7)
Neutrophils Relative %: 58 %
Platelets: 410 10*3/uL — ABNORMAL HIGH (ref 150–400)
RBC: 2.89 MIL/uL — ABNORMAL LOW (ref 4.22–5.81)
RDW: 14.2 % (ref 11.5–15.5)
WBC: 13.9 10*3/uL — ABNORMAL HIGH (ref 4.0–10.5)
nRBC: 0 % (ref 0.0–0.2)

## 2022-12-27 LAB — BASIC METABOLIC PANEL
Anion gap: 11 (ref 5–15)
BUN: 44 mg/dL — ABNORMAL HIGH (ref 6–20)
CO2: 27 mmol/L (ref 22–32)
Calcium: 8.2 mg/dL — ABNORMAL LOW (ref 8.9–10.3)
Chloride: 102 mmol/L (ref 98–111)
Creatinine, Ser: 4 mg/dL — ABNORMAL HIGH (ref 0.61–1.24)
GFR, Estimated: 18 mL/min — ABNORMAL LOW (ref >60)
Glucose, Bld: 93 mg/dL (ref 70–99)
Potassium: 3.7 mmol/L (ref 3.5–5.1)
Sodium: 140 mmol/L (ref 135–145)

## 2022-12-27 LAB — MAGNESIUM: Magnesium: 2.4 mg/dL (ref 1.7–2.4)

## 2022-12-27 LAB — BRAIN NATRIURETIC PEPTIDE: B Natriuretic Peptide: 24.8 pg/mL (ref 0.0–100.0)

## 2022-12-27 NOTE — Progress Notes (Signed)
PROGRESS NOTE        PATIENT DETAILS Name: Hunter Reyes Age: 47 y.o. Sex: male Date of Birth: [birth year] Admit Date: 12/02/2022 Admitting Physician [name], MD [id number], Edwinna Areola, MD  Brief Summary: Patient is a 47 y.o.  male with history of bipolar disorder, crack cocaine use, peripheral neuropathy-who was transferred from [facility] for AKI/urinary retention-upon further evaluation-he was found to have MRSA bacteremia with right foot abscess requiring TMA, lumbar discitis/abscess-requiring emergent laminectomy.  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  Significant events: 12/10>> admit to [facility] at [facility]-AKI-urinary retention.  Plans to transfer to [facility]. 12/13>> laminectomy/decompression of ventral lumbar epidural abscess 12/17>> transmetatarsal amputation of right foot 12/21>>voiding trial-foley removed 12/22>>worsening renal function-urinary retention-foley replaced 12/25>>Urine protein creatinine ratio of 7.68-w hematuria-suggestion of new AKI is 2/2 infection GN 12/26>>Temp HD cath placed by IR-HD started  Significant studies: 12/11>> CT renal stone study: Possible discitis L2-L3/L3-L4, moderate bilateral hydronephrosis/hydroureter 12/13>> MRI L-spine: Discitis/osteomyelitis at L3-L4 with 9 mm thick ventral canal phlegmon/abscess which extends from L3 to L4-L5.  Multiple paraspinal abscesses within the psoas musculature. 12/13>> echo: EF 60-65%, no evidence of valvular vegetations. 12/14>> MRI left foot: No osteomyelitis/abscess 12/15>> MRI right foot: Abscess with osteomyelitis of second/third metatarsals. 12/25>>Urine protein creatinine ratio of 7.68 12/25>> ASO titer: Negative 12/25>> ANA: Negative 12/25>> ANCA titers: Negative 12/25>> GBM antibody: Negative 12/25>> C3/C4: Normal limits. 12/25>> HBV/HCV serology:   Significant microbiology data: 12/10>> COVID/influenza PCR:  Negative 12/11>> blood culture: MRSA 12/13>> blood culture: MRSA 12/13>> epidural abscess culture: MRSA 12/14>> blood culture: No growth  Procedures: 12/13>> right L4 laminectomy-sublaminar decompression of ventral epidural abscess. 12/17>> transmetatarsal amputation of right foot 12/26>>Right IJ Trialysis CVC placed by IR  Consults: ID Neurosurgery IR Renal  Subjective:  Patient in bed, appears comfortable, denies any headache, no fever, no chest pain or pressure, no shortness of breath , no abdominal pain. No new focal weakness.   Objective:  Vitals: Blood pressure 117/61, pulse 76, temperature 98 F (36.7 C), temperature source Oral, resp. rate 16, height 6\' 2"  (1.88 m), weight 134.1 kg, SpO2 97 %.   Exam:  Awake Alert, No new F.N deficits, Normal affect [place].AT,PERRAL Supple Neck, No JVD,   Symmetrical Chest wall movement, Good air movement bilaterally, CTAB RRR,No Gallops, Rubs or new Murmurs,  +ve B.Sounds, Abd Soft, No tenderness,   Right transmetatarsal partial amputation with wound VAC, 1+ edema, Foley catheter in place,     Assessment/Plan:  MRSA bacteremia, L3-L4 discitis with epidural abscess-s/p laminectomy/decompression on 12/05/22 - Right foot osteomyelitis/abscess-s/p transmetatarsal amputation on 12/09/22  -   Overall improved-repeat cultures negative so far, Orthopedics/neurosurgery following-able to lift both lower extremities off the bed. Continue to work with PT/OT.  ID recommending 8 weeks of IV Daptomycin - end date 02/03/2023, repeat MRI L spine on 01/02/23- and to reconsult ID around 1/10 to revisit  antibiotic plan following repeat imaging. Given history of drug use-unfortunately will remain inpatient to complete IV antibiotic duration.  Not a candidate for home therapies.   AKI initially from obstructive uropathy, Recurrent AKI lately likely due to infectious glomerulonephritis - Urinary retention-Foley removed on 12/21-failed voiding trial-Foley  catheter reinserted on 12/22. Great urine output overnight-nephrology following-on intermittent HD.  Per nephrology dialysis catheter was removed on 12/25/2022, renal function plateaued but slight rise in creatinine  on 12/27/2022 will inform nephrology.   Hypokalemia, hypomagnesemia: Replete/recheck.  Normocytic anemia - Received 1 unit of packed RBC on 12/17/2022, H&H gradually dropping, anemia panel suggestive of mixed picture of some iron deficiency and relative B12 deficiency.  Placed on replacement.  No signs of bleeding.  On absurd continue to monitor.  Peripheral neuropathy - No longer on Neurontin-given worsening AKI.  Bipolar 1 disorder - Stable, Continue zoloft  Polysubstance abuse - History of cocaine use, able on present regimen at home was on Suboxone which was held earlier during this hospitalization.  Dental caries - Outpatient dentistry follow-up  Morbid Obesity: Estimated body mass index is 37.96 kg/m as calculated from the following:   Height as of this encounter: 6\' 2"  (1.88 m).   Weight as of this encounter: 134.1 kg.   Code status:   Code Status: Full Code   DVT Prophylaxis: heparin injection 5,000 Units Start: 12/16/22 1000 SCD's Start: 12/09/22 1137   Family Communication: None at bedside   Disposition Plan: Status is: Inpatient Remains inpatient appropriate because: Severity of illness   Planned Discharge Destination:Home ultimately-but will need to remain inpatient to complete IV daptomycin-end date 02/04/20   Diet: Diet Order             Diet renal with fluid restriction Fluid restriction: 1200 mL Fluid; Room service appropriate? Yes; Fluid consistency: Thin  Diet effective now                    MEDICATIONS: Scheduled Meds:  buprenorphine-naloxone  1 tablet Sublingual BID   Chlorhexidine Gluconate Cloth  6 each Topical Q0600   vitamin B-12  1,000 mcg Oral Daily   docusate sodium  200 mg Oral BID   ferrous sulfate  325 mg Oral BID WC    folic acid  1 mg Oral Daily   heparin injection (subcutaneous)  5,000 Units Subcutaneous Q8H   nutrition supplement (JUVEN)  1 packet Oral BID BM   pantoprazole  40 mg Oral Daily   polyethylene glycol  17 g Oral Daily   sertraline  25 mg Oral Daily   tamsulosin  0.4 mg Oral QPC supper   thiamine  500 mg Oral Daily   Continuous Infusions:  DAPTOmycin (CUBICIN) 850 mg in sodium chloride 0.9 % IVPB 850 mg (12/26/22 2059)   PRN Meds:.alum & mag hydroxide-simeth, bisacodyl, diphenhydrAMINE, guaiFENesin-dextromethorphan, ondansetron **OR** ondansetron (ZOFRAN) IV, oxyCODONE, phenol, tiZANidine, traZODone   I have personally reviewed following labs and imaging studies  LABORATORY DATA:  Recent Labs  Lab 12/23/22 0310 12/24/22 0341 12/25/22 0342 12/26/22 0209 12/27/22 0234  WBC 10.4 10.9* 11.5* 12.6* 13.9*  HGB 8.3* 8.0* 8.2* 8.2* 8.1*  HCT 25.8* 25.4* 25.2* 24.9* 26.3*  PLT 454* 479* 431* 431* 410*  MCV 89.6 89.4 89.4 88.3 91.0  MCH 28.8 28.2 29.1 29.1 28.0  MCHC 32.2 31.5 32.5 32.9 30.8  RDW 14.3 14.2 14.2 14.1 14.2  LYMPHSABS 1.9 2.0 2.4 2.9 2.5  MONOABS 1.6* 1.6* 1.6* 1.6* 1.9*  EOSABS 0.5 0.4 0.3 0.3 0.5  BASOSABS 0.1 0.1 0.1 0.3* 0.1    Recent Labs  Lab 12/21/22 0111 12/21/22 0111 12/22/22 0233 12/23/22 0310 12/24/22 0341 12/25/22 0342 12/26/22 0209 12/27/22 0234  NA 141  --  143 145 141 143 142 140  K 3.3*  --  3.2* 3.5 3.1* 4.1 3.1* 3.7  CL 105  --  104 105 103 105 102 102  CO2 22  --  24 24 23 24  27  27  ANIONGAP 14  --  15 16* 15 14 13 11   GLUCOSE 92  --  96 94 86 87 89 93  BUN 69*  --  66* 60* 54* 47* 45* 44*  CREATININE 4.95*  --  4.57* 4.33* 3.84* 3.68* 3.55* 4.00*  ALBUMIN 1.6*  --  1.7* 1.8*  --   --   --   --   CRP  --   --  14.1* 13.1*  --   --   --   --   BNP  --   --   --   --  102.1* 124.0* 46.3 24.8  MG  --    < > 1.5* 2.2 1.9 1.8 1.6* 2.4  CALCIUM 7.6*  --  7.8* 8.1* 7.9* 8.4* 8.0* 8.2*   < > = values in this interval not displayed.     RADIOLOGY STUDIES/RESULTS: No results found.   LOS: 24 days   Signature  -    Lala Lund M.D on 12/27/2022 at 9:43 AM   -  To page go to [email or web address]

## 2022-12-28 DIAGNOSIS — B9562 Methicillin resistant Staphylococcus aureus infection as the cause of diseases classified elsewhere: Secondary | ICD-10-CM | POA: Diagnosis not present

## 2022-12-28 DIAGNOSIS — R7881 Bacteremia: Secondary | ICD-10-CM | POA: Diagnosis not present

## 2022-12-28 LAB — CBC WITH DIFFERENTIAL/PLATELET
Abs Immature Granulocytes: 0.7 10*3/uL — ABNORMAL HIGH (ref 0.00–0.07)
Basophils Absolute: 0.1 10*3/uL (ref 0.0–0.1)
Basophils Relative: 1 %
Eosinophils Absolute: 0.5 10*3/uL (ref 0.0–0.5)
Eosinophils Relative: 4 %
HCT: 27.6 % — ABNORMAL LOW (ref 39.0–52.0)
Hemoglobin: 8.5 g/dL — ABNORMAL LOW (ref 13.0–17.0)
Immature Granulocytes: 5 %
Lymphocytes Relative: 16 %
Lymphs Abs: 2.3 10*3/uL (ref 0.7–4.0)
MCH: 27.8 pg (ref 26.0–34.0)
MCHC: 30.8 g/dL (ref 30.0–36.0)
MCV: 90.2 fL (ref 80.0–100.0)
Monocytes Absolute: 1.7 10*3/uL — ABNORMAL HIGH (ref 0.1–1.0)
Monocytes Relative: 11 %
Neutro Abs: 9.4 10*3/uL — ABNORMAL HIGH (ref 1.7–7.7)
Neutrophils Relative %: 63 %
Platelets: 382 10*3/uL (ref 150–400)
RBC: 3.06 MIL/uL — ABNORMAL LOW (ref 4.22–5.81)
RDW: 14.5 % (ref 11.5–15.5)
WBC: 14.7 10*3/uL — ABNORMAL HIGH (ref 4.0–10.5)
nRBC: 0 % (ref 0.0–0.2)

## 2022-12-28 LAB — BASIC METABOLIC PANEL
Anion gap: 11 (ref 5–15)
BUN: 45 mg/dL — ABNORMAL HIGH (ref 6–20)
CO2: 26 mmol/L (ref 22–32)
Calcium: 8.2 mg/dL — ABNORMAL LOW (ref 8.9–10.3)
Chloride: 103 mmol/L (ref 98–111)
Creatinine, Ser: 4.25 mg/dL — ABNORMAL HIGH (ref 0.61–1.24)
GFR, Estimated: 17 mL/min — ABNORMAL LOW (ref >60)
Glucose, Bld: 96 mg/dL (ref 70–99)
Potassium: 3.9 mmol/L (ref 3.5–5.1)
Sodium: 140 mmol/L (ref 135–145)

## 2022-12-28 MED ORDER — LACTATED RINGERS IV SOLN: INTRAVENOUS | Status: AC

## 2022-12-28 NOTE — Progress Notes (Signed)
PROGRESS NOTE        PATIENT DETAILS Name: Hunter Reyes Age: 47 y.o. Sex: male Date of Birth: [birth year] Admit Date: 12/02/2022 Admitting Physician [name], MD [id number], Edwinna Areola, MD  Brief Summary: Patient is a 47 y.o.  male with history of bipolar disorder, crack cocaine use, peripheral neuropathy-who was transferred from [facility] for AKI/urinary retention-upon further evaluation-he was found to have MRSA bacteremia with right foot abscess requiring TMA, lumbar discitis/abscess-requiring emergent laminectomy.  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  Significant events: 12/10>> admit to [facility] at [facility]-AKI-urinary retention.  Plans to transfer to [facility]. 12/13>> laminectomy/decompression of ventral lumbar epidural abscess 12/17>> transmetatarsal amputation of right foot 12/21>>voiding trial-foley removed 12/22>>worsening renal function-urinary retention-foley replaced 12/25>>Urine protein creatinine ratio of 7.68-w hematuria-suggestion of new AKI is 2/2 infection GN 12/26>>Temp HD cath placed by IR-HD started  Significant studies: 12/11>> CT renal stone study: Possible discitis L2-L3/L3-L4, moderate bilateral hydronephrosis/hydroureter 12/13>> MRI L-spine: Discitis/osteomyelitis at L3-L4 with 9 mm thick ventral canal phlegmon/abscess which extends from L3 to L4-L5.  Multiple paraspinal abscesses within the psoas musculature. 12/13>> echo: EF 60-65%, no evidence of valvular vegetations. 12/14>> MRI left foot: No osteomyelitis/abscess 12/15>> MRI right foot: Abscess with osteomyelitis of second/third metatarsals. 12/25>>Urine protein creatinine ratio of 7.68 12/25>> ASO titer: Negative 12/25>> ANA: Negative 12/25>> ANCA titers: Negative 12/25>> GBM antibody: Negative 12/25>> C3/C4: Normal limits. 12/25>> HBV/HCV serology:   Significant microbiology data: 12/10>> COVID/influenza PCR:  Negative 12/11>> blood culture: MRSA 12/13>> blood culture: MRSA 12/13>> epidural abscess culture: MRSA 12/14>> blood culture: No growth  Procedures: 12/13>> right L4 laminectomy-sublaminar decompression of ventral epidural abscess. 12/17>> transmetatarsal amputation of right foot 12/26>>Right IJ Trialysis CVC placed by IR  Consults: ID Neurosurgery IR Renal  Subjective:  Patient in bed, appears comfortable, denies any headache, no fever, no chest pain or pressure, no shortness of breath , no abdominal pain. No new focal weakness.  Objective:  Vitals: Blood pressure 104/60, pulse 75, temperature 98.3 F (36.8 C), temperature source Oral, resp. rate 16, height 6\' 2"  (1.88 m), weight 134.1 kg, SpO2 97 %.   Exam:  Awake Alert, No new F.N deficits, Normal affect [place].AT,PERRAL Supple Neck, No JVD,   Symmetrical Chest wall movement, Good air movement bilaterally, CTAB RRR,No Gallops, Rubs or new Murmurs,  +ve B.Sounds, Abd Soft, No tenderness,   Right transmetatarsal partial amputation with wound VAC, 1+ edema, Foley catheter in place,     Assessment/Plan:  MRSA bacteremia, L3-L4 discitis with epidural abscess-s/p laminectomy/decompression on 12/05/22 - Right foot osteomyelitis/abscess-s/p transmetatarsal amputation on 12/09/22  -   Overall improved-repeat cultures negative so far, Orthopedics/neurosurgery following-able to lift both lower extremities off the bed. Continue to work with PT/OT.  ID recommending 8 weeks of IV Daptomycin - end date 02/03/2023, repeat MRI L spine on 01/02/23- and to reconsult ID around 1/10 to revisit  antibiotic plan following repeat imaging. Given history of drug use-unfortunately will remain inpatient to complete IV antibiotic duration.  Not a candidate for home therapies.   AKI initially from obstructive uropathy, Recurrent AKI lately likely due to infectious glomerulonephritis - Urinary retention-Foley removed on 12/21-failed voiding trial-Foley  catheter reinserted on 12/22. Great urine output overnight-nephrology following-on intermittent HD.  Per nephrology dialysis catheter was removed on 12/25/2022, renal function plateaued but slightly rising since 12/27/2023 discussed  with Dr. Joelyn Oms nephrologist on 12/27/2022 who will remotely monitor.  Will gently hydrate on 12/28/2022 at urine continues to look quite concentrated.   Hypokalemia, hypomagnesemia: Replete/recheck.  Normocytic anemia - Received 1 unit of packed RBC on 12/17/2022, H&H gradually dropping, anemia panel suggestive of mixed picture of some iron deficiency and relative B12 deficiency.  Placed on replacement.  No signs of bleeding.  On absurd continue to monitor.  Peripheral neuropathy - No longer on Neurontin-given worsening AKI.  Bipolar 1 disorder - Stable, Continue zoloft  Polysubstance abuse - History of cocaine use, able on present regimen at home was on Suboxone which was held earlier during this hospitalization.  Dental caries - Outpatient dentistry follow-up  Morbid Obesity: Estimated body mass index is 37.96 kg/m as calculated from the following:   Height as of this encounter: 6\' 2"  (1.88 m).   Weight as of this encounter: 134.1 kg.   Code status:   Code Status: Full Code   DVT Prophylaxis: heparin injection 5,000 Units Start: 12/16/22 1000 SCD's Start: 12/09/22 1137   Family Communication: None at bedside   Disposition Plan: Status is: Inpatient Remains inpatient appropriate because: Severity of illness   Planned Discharge Destination:Home ultimately-but will need to remain inpatient to complete IV daptomycin-end date 02/04/20   Diet: Diet Order             Diet renal with fluid restriction Fluid restriction: 1200 mL Fluid; Room service appropriate? Yes; Fluid consistency: Thin  Diet effective now                    MEDICATIONS: Scheduled Meds:  buprenorphine-naloxone  1 tablet Sublingual BID   Chlorhexidine Gluconate Cloth  6 each  Topical Q0600   vitamin B-12  1,000 mcg Oral Daily   docusate sodium  200 mg Oral BID   ferrous sulfate  325 mg Oral BID WC   folic acid  1 mg Oral Daily   heparin injection (subcutaneous)  5,000 Units Subcutaneous Q8H   nutrition supplement (JUVEN)  1 packet Oral BID BM   pantoprazole  40 mg Oral Daily   polyethylene glycol  17 g Oral Daily   sertraline  25 mg Oral Daily   tamsulosin  0.4 mg Oral QPC supper   thiamine  500 mg Oral Daily   Continuous Infusions:  DAPTOmycin (CUBICIN) 850 mg in sodium chloride 0.9 % IVPB 850 mg (12/27/22 2137)   PRN Meds:.alum & mag hydroxide-simeth, bisacodyl, diphenhydrAMINE, guaiFENesin-dextromethorphan, ondansetron **OR** ondansetron (ZOFRAN) IV, oxyCODONE, phenol, tiZANidine, traZODone   I have personally reviewed following labs and imaging studies  LABORATORY DATA:  Recent Labs  Lab 12/24/22 0341 12/25/22 0342 12/26/22 0209 12/27/22 0234 12/28/22 0432  WBC 10.9* 11.5* 12.6* 13.9* 14.7*  HGB 8.0* 8.2* 8.2* 8.1* 8.5*  HCT 25.4* 25.2* 24.9* 26.3* 27.6*  PLT 479* 431* 431* 410* 382  MCV 89.4 89.4 88.3 91.0 90.2  MCH 28.2 29.1 29.1 28.0 27.8  MCHC 31.5 32.5 32.9 30.8 30.8  RDW 14.2 14.2 14.1 14.2 14.5  LYMPHSABS 2.0 2.4 2.9 2.5 2.3  MONOABS 1.6* 1.6* 1.6* 1.9* 1.7*  EOSABS 0.4 0.3 0.3 0.5 0.5  BASOSABS 0.1 0.1 0.3* 0.1 0.1    Recent Labs  Lab 12/22/22 0233 12/23/22 0310 12/24/22 0341 12/25/22 0342 12/26/22 0209 12/27/22 0234 12/28/22 0432  NA 143 145 141 143 142 140 140  K 3.2* 3.5 3.1* 4.1 3.1* 3.7 3.9  CL 104 105 103 105 102 102 103  CO2 24 24  23 24 27 27 26   ANIONGAP 15 16* 15 14 13 11 11   GLUCOSE 96 94 86 87 89 93 96  BUN 66* 60* 54* 47* 45* 44* 45*  CREATININE 4.57* 4.33* 3.84* 3.68* 3.55* 4.00* 4.25*  ALBUMIN 1.7* 1.8*  --   --   --   --   --   CRP 14.1* 13.1*  --   --   --   --   --   BNP  --   --  102.1* 124.0* 46.3 24.8  --   MG 1.5* 2.2 1.9 1.8 1.6* 2.4  --   CALCIUM 7.8* 8.1* 7.9* 8.4* 8.0* 8.2* 8.2*     RADIOLOGY STUDIES/RESULTS: No results found.   LOS: 25 days   Signature  -    Lala Lund M.D on 12/28/2022 at 8:31 AM   -  To page go to [email or web address]

## 2022-12-28 NOTE — Progress Notes (Signed)
Physical Therapy Treatment Patient Details Name: Hunter Reyes MRN: [id number] DOB: August 29, 1976 Today's Date: 12/28/2022   History of Present Illness 47 y.o. male admitted 12/10 with AMS, urinary retention and low back pain. Underwent L4 lumbar laminectomy for epidural abscess 12/13. s/p R foot transmet amp 12/17. Pt initiated HD on 12/26. PMH:  bipolar 1 disorder, borderline personality disorder, COPD, crack cocaine abuse, depression, peripheral neuropathy, history of right foot osteomyelitis, panic attack and history of seizures    PT Comments    Patient seen for progressing mobility out of the room in wheelchair.  Needs much extended time due to pain in his back and L arm with IV and stops each time he feels pain.  Also had to place on bed pan initially prior to mobility.  Patient able to propel wheelchair with rest breaks but did not need propelling help but did have to help to manage parts and for set up for transfers.  Scooting transfers more difficult than to flat drop arm recliner so initially pt performed squat pivot placing weight on R foot despite cues.  Then to bed needed sliding board for improved ease of transfer, but pt still placed weight on feet to reposition hips in bed.  Feel he remains appropriate for STSNF level rehab at d/c.    Recommendations for follow up therapy are one component of a multi-disciplinary discharge planning process, led by the attending physician.  Recommendations may be updated based on patient status, additional functional criteria and insurance authorization.  Follow Up Recommendations  Skilled nursing-short term rehab (<3 hours/day) Can patient physically be transported by private vehicle: No   Assistance Recommended at Discharge Frequent or constant Supervision/Assistance  Patient can return home with the following Assistance with cooking/housework;Assist for transportation;Help with stairs or ramp for entrance;A lot of help with walking and/or  transfers;A little help with bathing/dressing/bathroom   Equipment Recommendations  Rolling walker (2 wheels);Wheelchair (measurements PT)    Recommendations for Other Services       Precautions / Restrictions Precautions Precautions: Fall;Back Precaution Comments: watch HR, incontinent stool Required Braces or Orthoses: Other Brace Other Brace: post op shoe- not present during session Restrictions RLE Weight Bearing: Non weight bearing     Mobility  Bed Mobility Overal bed mobility: Needs Assistance Bed Mobility: Sidelying to Sit, Rolling Rolling: Supervision Sidelying to sit: Supervision     Sit to sidelying: Supervision General bed mobility comments: increased time and cues for technique    Transfers   Equipment used: Sliding board Transfers: Bed to chair/wheelchair/BSC            Lateral/Scoot Transfers: Min guard, Min assist General transfer comment: scooting to wheelchair with increased time, finally did squat pivot despite cues for NWB; much c/o back pain throughout, to bed from wheelchair utilized slide board and pt assisted to scoot, but still up on feet to reposition hips on EOB despite cues    Ambulation/Gait                   Theme park manager mobility: Yes Wheelchair propulsion: Both upper extremities Wheelchair parts: Needs assistance Distance: 150 Wheelchair Assistance Details (indicate cue type and reason): increased time with two rest breaks in hallway and cues for safely negotiating doorways; assist for legrests, armrest, brakes  Modified Rankin (Stroke Patients Only)       Balance Overall balance assessment: Needs assistance Sitting-balance support: No upper  extremity supported, Feet supported Sitting balance-[name] Scale: Good Sitting balance - Comments: can sit without support, but flexed posture                                    Cognition  Arousal/Alertness: Awake/alert Behavior During Therapy: Flat affect Overall Cognitive Status: No family/caregiver present to determine baseline cognitive functioning Area of Impairment: Attention, Problem solving, Memory                   Current Attention Level: Sustained Memory: Decreased recall of precautions   Safety/Judgement: Decreased awareness of safety   Problem Solving: Slow processing General Comments: keeps putting weight on R foot despite cues        Exercises      General Comments General comments (skin integrity, edema, etc.): First time sitting EOB pt requesting to use bed pan, assisted to place back in supine per his request.  Stayed on extended time while PT charting, finally removed but pt did not have BM assisted for hygiene and completed session with increased time      Pertinent Vitals/Pain Pain Assessment Faces Pain Scale: Hurts even more Pain Location: back Pain Descriptors / Indicators: Grimacing, Guarding Pain Intervention(s): Monitored during session, Repositioned    Home Living                          Prior Function            PT Goals (current goals can now be found in the care plan section) Progress towards PT goals: Progressing toward goals    Frequency    Min 2X/week      PT Plan Current plan remains appropriate    Co-evaluation              AM-PAC PT "6 Clicks" Mobility   Outcome Measure  Help needed turning from your back to your side while in a flat bed without using bedrails?: None Help needed moving from lying on your back to sitting on the side of a flat bed without using bedrails?: A Little Help needed moving to and from a bed to a chair (including a wheelchair)?: A Little Help needed standing up from a chair using your arms (e.g., wheelchair or bedside chair)?: Total Help needed to walk in hospital room?: Total Help needed climbing 3-5 steps with a railing? : Total 6 Click Score: 13    End  of Session   Activity Tolerance: Patient limited by pain Patient left: in bed;with call bell/phone within reach   PT Visit Diagnosis: Muscle weakness (generalized) (M62.81);Pain;Other abnormalities of gait and mobility (R26.89) Pain - part of body:  (back)     Time: 1505-1600 PT Time Calculation (min) (ACUTE ONLY): 55 min  Charges:  $Therapeutic Activity: 23-37 mins $Wheel Chair Management: 23-37 mins                     Magda Kiel, PT Acute Rehabilitation Services Office:[phone] 12/28/2022    Reginia Naas 12/28/2022, 5:24 PM

## 2022-12-28 NOTE — Progress Notes (Signed)
Occupational Therapy Treatment Patient Details Name: Hunter Reyes MRN: [id number] DOB: 1976-03-08 Today's Date: 12/28/2022   History of present illness 47 y.o. male admitted 12/10 with AMS, urinary retention and low back pain. Underwent L4 lumbar laminectomy for epidural abscess 12/13. s/p R foot transmet amp 12/17. Pt initiated HD on 12/26. PMH:  bipolar 1 disorder, borderline personality disorder, COPD, crack cocaine abuse, depression, peripheral neuropathy, history of right foot osteomyelitis, panic attack and history of seizures   OT comments  Patient received in supine and agreeable to OT session. Patient able to get to EOB with increased time supervision and performed lateral scoot transfer to recliner with min guard assist. Patient stated he wanted to return to bed to use bed pan and declined BSC stating it was not big enough . Patient assisted back to bed, bed pan, and assistance with cleaning. Patient returned to recliner but stated he felt too much back pain and asked to return back to supine. Discharge recommendations continue to be appropriate and will benefit from further OT services to increase safety and independence with self care and functional transfers.    Recommendations for follow up therapy are one component of a multi-disciplinary discharge planning process, led by the attending physician.  Recommendations may be updated based on patient status, additional functional criteria and insurance authorization.    Follow Up Recommendations  Skilled nursing-short term rehab (<3 hours/day)     Assistance Recommended at Discharge Intermittent Supervision/Assistance  Patient can return home with the following  A lot of help with bathing/dressing/bathroom;Assistance with cooking/housework;Help with stairs or ramp for entrance;Assist for transportation;A lot of help with walking and/or transfers   Equipment Recommendations  Wheelchair (measurements OT);Wheelchair cushion  (measurements OT);Other (comment) (bari drop arm commode)    Recommendations for Other Services      Precautions / Restrictions Precautions Precautions: Fall;Back Precaution Booklet Issued: No Precaution Comments: watch HR, incontinent stool Required Braces or Orthoses: Other Brace Other Brace: post op shoe- not present during session Restrictions Weight Bearing Restrictions: Yes RLE Weight Bearing: Non weight bearing       Mobility Bed Mobility Overal bed mobility: Needs Assistance Bed Mobility: Rolling, Sidelying to Sit, Sit to Sidelying Rolling: Supervision Sidelying to sit: Supervision     Sit to sidelying: Supervision General bed mobility comments: increased time and cues for technique    Transfers Overall transfer level: Needs assistance Equipment used: None Transfers: Bed to chair/wheelchair/BSC            Lateral/Scoot Transfers: Min guard General transfer comment: performed transfer bed <>recliner x2 with min guard assist and cues for NWB     Balance Overall balance assessment: Needs assistance Sitting-balance support: No upper extremity supported, Feet supported Sitting balance-[name] Scale: Good Sitting balance - Comments: sitting without support                                   ADL either performed or assessed with clinical judgement   ADL Overall ADL's : Needs assistance/impaired                           Toilet Transfer Details (indicate cue type and reason): rolled for placement of bed pan Toileting- Clothing Manipulation and Hygiene: Total assistance;Bed level Toileting - Clothing Manipulation Details (indicate cue type and reason): assisted with cleaning following bed pan use       General ADL  Comments: patient focused on bed pan use    Extremity/Trunk Assessment              Vision       Perception     Praxis      Cognition Arousal/Alertness: Awake/alert Behavior During Therapy: Flat  affect Overall Cognitive Status: No family/caregiver present to determine baseline cognitive functioning Area of Impairment: Attention, Problem solving, Memory                   Current Attention Level: Sustained Memory: Decreased recall of precautions   Safety/Judgement: Decreased awareness of safety   Problem Solving: Slow processing General Comments: cues for NWB during transfers        Exercises      Shoulder Instructions       General Comments      Pertinent Vitals/ Pain       Pain Assessment Pain Assessment: Faces Faces Pain Scale: Hurts even more Pain Location: back Pain Descriptors / Indicators: Grimacing, Guarding Pain Intervention(s): Limited activity within patient's tolerance, Monitored during session, Repositioned  Home Living                                          Prior Functioning/Environment              Frequency  Min 2X/week        Progress Toward Goals  OT Goals(current goals can now be found in the care plan section)  Progress towards OT goals: Progressing toward goals  Acute Rehab OT Goals Patient Stated Goal: get better OT Goal Formulation: With patient Time For Goal Achievement: 01/09/23 Potential to Achieve Goals: Fair ADL Goals Pt Will Perform Grooming: sitting;with set-up Pt Will Perform Lower Body Dressing: sitting/lateral leans;with supervision Pt Will Transfer to Toilet: with supervision;with transfer board;bedside commode Pt Will Perform Tub/Shower Transfer: Tub transfer;Stand pivot transfer;with min assist;tub bench Additional ADL Goal #1: Patient will maintain NWB on RLE and spinal precautions throughout ADLs.  Plan Discharge plan remains appropriate    Co-evaluation                 AM-PAC OT "6 Clicks" Daily Activity     Outcome Measure   Help from another person eating meals?: None Help from another person taking care of personal grooming?: A Little Help from another person  toileting, which includes using toliet, bedpan, or urinal?: Total Help from another person bathing (including washing, rinsing, drying)?: A Lot Help from another person to put on and taking off regular upper body clothing?: A Little Help from another person to put on and taking off regular lower body clothing?: A Lot 6 Click Score: 15    End of Session    OT Visit Diagnosis: Unsteadiness on feet (R26.81);Muscle weakness (generalized) (M62.81);Pain;Other symptoms and signs involving cognitive function;Other abnormalities of gait and mobility (R26.89)   Activity Tolerance Patient limited by pain   Patient Left in bed;with call bell/phone within reach;with bed alarm set   Nurse Communication Mobility status;Other (comment) (spot of blood on bed sheets, possible from back)        Time: 6761-9509 OT Time Calculation (min): 31 min  Charges: OT General Charges $OT Visit: 1 Visit OT Treatments $Self Care/Home Management : 8-22 mins $Therapeutic Activity: 8-22 mins  Lodema Hong, OTA Acute Rehabilitation Services  Office [phone]   Trixie Dredge 12/28/2022, 11:19 AM

## 2022-12-29 DIAGNOSIS — B9562 Methicillin resistant Staphylococcus aureus infection as the cause of diseases classified elsewhere: Secondary | ICD-10-CM | POA: Diagnosis not present

## 2022-12-29 DIAGNOSIS — R7881 Bacteremia: Secondary | ICD-10-CM | POA: Diagnosis not present

## 2022-12-29 LAB — CBC WITH DIFFERENTIAL/PLATELET
Abs Immature Granulocytes: 0.44 10*3/uL — ABNORMAL HIGH (ref 0.00–0.07)
Basophils Absolute: 0.1 10*3/uL (ref 0.0–0.1)
Basophils Relative: 0 %
Eosinophils Absolute: 0.5 10*3/uL (ref 0.0–0.5)
Eosinophils Relative: 4 %
HCT: 25.3 % — ABNORMAL LOW (ref 39.0–52.0)
Hemoglobin: 8.3 g/dL — ABNORMAL LOW (ref 13.0–17.0)
Immature Granulocytes: 3 %
Lymphocytes Relative: 13 %
Lymphs Abs: 2 10*3/uL (ref 0.7–4.0)
MCH: 28.8 pg (ref 26.0–34.0)
MCHC: 32.8 g/dL (ref 30.0–36.0)
MCV: 87.8 fL (ref 80.0–100.0)
Monocytes Absolute: 1.8 10*3/uL — ABNORMAL HIGH (ref 0.1–1.0)
Monocytes Relative: 12 %
Neutro Abs: 10.1 10*3/uL — ABNORMAL HIGH (ref 1.7–7.7)
Neutrophils Relative %: 68 %
Platelets: 345 10*3/uL (ref 150–400)
RBC: 2.88 MIL/uL — ABNORMAL LOW (ref 4.22–5.81)
RDW: 14.5 % (ref 11.5–15.5)
WBC: 15 10*3/uL — ABNORMAL HIGH (ref 4.0–10.5)
nRBC: 0 % (ref 0.0–0.2)

## 2022-12-29 LAB — BASIC METABOLIC PANEL
Anion gap: 13 (ref 5–15)
BUN: 41 mg/dL — ABNORMAL HIGH (ref 6–20)
CO2: 24 mmol/L (ref 22–32)
Calcium: 8 mg/dL — ABNORMAL LOW (ref 8.9–10.3)
Chloride: 101 mmol/L (ref 98–111)
Creatinine, Ser: 4.36 mg/dL — ABNORMAL HIGH (ref 0.61–1.24)
GFR, Estimated: 16 mL/min — ABNORMAL LOW (ref >60)
Glucose, Bld: 97 mg/dL (ref 70–99)
Potassium: 3.4 mmol/L — ABNORMAL LOW (ref 3.5–5.1)
Sodium: 138 mmol/L (ref 135–145)

## 2022-12-29 MED ORDER — LACTATED RINGERS IV SOLN: INTRAVENOUS | Status: AC

## 2022-12-29 MED ORDER — POTASSIUM CHLORIDE CRYS ER 20 MEQ PO TBCR
40.0000 meq | EXTENDED_RELEASE_TABLET | Freq: Once | ORAL | Status: AC
  Administered 2022-12-29: 40 meq via ORAL
  Filled 2022-12-29: qty 2

## 2022-12-29 NOTE — Progress Notes (Signed)
PROGRESS NOTE        PATIENT DETAILS Name: Hunter Reyes Age: 47 y.o. Sex: male Date of Birth: 28-Feb-1976 Admit Date: 12/02/2022 Admitting Physician [name], MD PFX:[id number], Edwinna Areola, MD  Brief Summary: Patient is a 47 y.o.  male with history of bipolar disorder, crack cocaine use, peripheral neuropathy-who was transferred from [facility] for AKI/urinary retention-upon further evaluation-he was found to have MRSA bacteremia with right foot abscess requiring TMA, lumbar discitis/abscess-requiring emergent laminectomy.  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  Significant events: 12/10>> admit to [facility] at [facility]-AKI-urinary retention.  Plans to transfer to [facility]. 12/13>> laminectomy/decompression of ventral lumbar epidural abscess 12/17>> transmetatarsal amputation of right foot 12/21>>voiding trial-foley removed 12/22>>worsening renal function-urinary retention-foley replaced 12/25>>Urine protein creatinine ratio of 7.68-w hematuria-suggestion of new AKI is 2/2 infection GN 12/26>>Temp HD cath placed by IR-HD started  Significant studies: 12/11>> CT renal stone study: Possible discitis L2-L3/L3-L4, moderate bilateral hydronephrosis/hydroureter 12/13>> MRI L-spine: Discitis/osteomyelitis at L3-L4 with 9 mm thick ventral canal phlegmon/abscess which extends from L3 to L4-L5.  Multiple paraspinal abscesses within the psoas musculature. 12/13>> echo: EF 60-65%, no evidence of valvular vegetations. 12/14>> MRI left foot: No osteomyelitis/abscess 12/15>> MRI right foot: Abscess with osteomyelitis of second/third metatarsals. 12/25>>Urine protein creatinine ratio of 7.68 12/25>> ASO titer: Negative 12/25>> ANA: Negative 12/25>> ANCA titers: Negative 12/25>> GBM antibody: Negative 12/25>> C3/C4: Normal limits. 12/25>> HBV/HCV serology:   Significant microbiology data: 12/10>> COVID/influenza PCR:  Negative 12/11>> blood culture: MRSA 12/13>> blood culture: MRSA 12/13>> epidural abscess culture: MRSA 12/14>> blood culture: No growth  Procedures: 12/13>> right L4 laminectomy-sublaminar decompression of ventral epidural abscess. 12/17>> transmetatarsal amputation of right foot 12/26>>Right IJ Trialysis CVC placed by IR  Consults: ID Neurosurgery IR Renal  Subjective:  Patient in bed, appears comfortable, denies any headache, no fever, no chest pain or pressure, no shortness of breath , no abdominal pain. No new focal weakness.   Objective:  Vitals: Blood pressure 115/65, pulse 72, temperature 97.9 F (36.6 C), temperature source Oral, resp. rate 18, height 6\' 2"  (1.88 m), weight 134.1 kg, SpO2 95 %.   Exam:  Awake Alert, No new F.N deficits, Normal affect [place].AT,PERRAL Supple Neck, No JVD,   Symmetrical Chest wall movement, Good air movement bilaterally, CTAB RRR,No Gallops, Rubs or new Murmurs,  +ve B.Sounds, Abd Soft, No tenderness,   Right transmetatarsal partial amputation with wound VAC, 1+ edema, Foley catheter in place,     Assessment/Plan:  MRSA bacteremia, L3-L4 discitis with epidural abscess-s/p laminectomy/decompression on 12/05/22 - Right foot osteomyelitis/abscess-s/p transmetatarsal amputation on 12/09/22  -   Overall improved-repeat cultures negative so far, Orthopedics/neurosurgery following-able to lift both lower extremities off the bed. Continue to work with PT/OT.  ID recommending 8 weeks of IV Daptomycin - end date 02/03/2023, repeat MRI L spine on 01/02/23- and to reconsult ID around 1/10 to revisit  antibiotic plan following repeat imaging. Given history of drug use-unfortunately will remain inpatient to complete IV antibiotic duration.  Not a candidate for home therapies.   AKI initially from obstructive uropathy, Recurrent AKI lately likely due to infectious glomerulonephritis - Urinary retention-Foley removed on 12/21-failed voiding trial-Foley  catheter reinserted on 12/22. Great urine output overnight-nephrology following-on intermittent HD.  Per nephrology dialysis catheter was removed on 12/25/2022, renal function plateaued but slightly rising since 12/27/2023  discussed with Dr. Joelyn Oms nephrologist on 12/27/2022 who will remotely monitor.  Will gently hydrate on 12/28/2022 - 12/29/22 as urine continues to look quite concentrated in the Foley bag, continue to monitor.   Hypokalemia, hypomagnesemia: Replete/recheck.  Normocytic anemia - Received 1 unit of packed RBC on 12/17/2022, H&H gradually dropping, anemia panel suggestive of mixed picture of some iron deficiency and relative B12 deficiency.  Placed on replacement.  No signs of bleeding.  On absurd continue to monitor.  Peripheral neuropathy - No longer on Neurontin-given worsening AKI.  Bipolar 1 disorder - Stable, Continue zoloft  Polysubstance abuse - History of cocaine use, able on present regimen at home was on Suboxone which was held earlier during this hospitalization.  Dental caries - Outpatient dentistry follow-up  Morbid Obesity: Estimated body mass index is 37.96 kg/m as calculated from the following:   Height as of this encounter: 6\' 2"  (1.88 m).   Weight as of this encounter: 134.1 kg.   Code status:   Code Status: Full Code   DVT Prophylaxis: heparin injection 5,000 Units Start: 12/16/22 1000 SCD's Start: 12/09/22 1137   Family Communication: None at bedside   Disposition Plan: Status is: Inpatient Remains inpatient appropriate because: Severity of illness   Planned Discharge Destination:Home ultimately-but will need to remain inpatient to complete IV daptomycin-end date 02/04/20   Diet: Diet Order             Diet renal with fluid restriction Fluid restriction: 1200 mL Fluid; Room service appropriate? Yes; Fluid consistency: Thin  Diet effective now                    MEDICATIONS: Scheduled Meds:  buprenorphine-naloxone  1 tablet Sublingual BID    Chlorhexidine Gluconate Cloth  6 each Topical Q0600   vitamin B-12  1,000 mcg Oral Daily   docusate sodium  200 mg Oral BID   ferrous sulfate  325 mg Oral BID WC   folic acid  1 mg Oral Daily   heparin injection (subcutaneous)  5,000 Units Subcutaneous Q8H   nutrition supplement (JUVEN)  1 packet Oral BID BM   pantoprazole  40 mg Oral Daily   polyethylene glycol  17 g Oral Daily   sertraline  25 mg Oral Daily   tamsulosin  0.4 mg Oral QPC supper   thiamine  500 mg Oral Daily   Continuous Infusions:  DAPTOmycin (CUBICIN) 850 mg in sodium chloride 0.9 % IVPB Stopped (12/28/22 2117)   lactated ringers 100 mL/hr at 12/29/22 0907   PRN Meds:.alum & mag hydroxide-simeth, bisacodyl, diphenhydrAMINE, guaiFENesin-dextromethorphan, ondansetron **OR** ondansetron (ZOFRAN) IV, oxyCODONE, phenol, tiZANidine, traZODone   I have personally reviewed following labs and imaging studies  LABORATORY DATA:  Recent Labs  Lab 12/24/22 0341 12/25/22 0342 12/26/22 0209 12/27/22 0234 12/28/22 0432  WBC 10.9* 11.5* 12.6* 13.9* 14.7*  HGB 8.0* 8.2* 8.2* 8.1* 8.5*  HCT 25.4* 25.2* 24.9* 26.3* 27.6*  PLT 479* 431* 431* 410* 382  MCV 89.4 89.4 88.3 91.0 90.2  MCH 28.2 29.1 29.1 28.0 27.8  MCHC 31.5 32.5 32.9 30.8 30.8  RDW 14.2 14.2 14.1 14.2 14.5  LYMPHSABS 2.0 2.4 2.9 2.5 2.3  MONOABS 1.6* 1.6* 1.6* 1.9* 1.7*  EOSABS 0.4 0.3 0.3 0.5 0.5  BASOSABS 0.1 0.1 0.3* 0.1 0.1    Recent Labs  Lab 12/23/22 0310 12/24/22 0341 12/25/22 0342 12/26/22 0209 12/27/22 0234 12/28/22 0432  NA 145 141 143 142 140 140  K 3.5 3.1* 4.1 3.1* 3.7  3.9  CL 105 103 105 102 102 103  CO2 24 23 24 27 27 26   ANIONGAP 16* 15 14 13 11 11   GLUCOSE 94 86 87 89 93 96  BUN 60* 54* 47* 45* 44* 45*  CREATININE 4.33* 3.84* 3.68* 3.55* 4.00* 4.25*  ALBUMIN 1.8*  --   --   --   --   --   CRP 13.1*  --   --   --   --   --   BNP  --  102.1* 124.0* 46.3 24.8  --   MG 2.2 1.9 1.8 1.6* 2.4  --   CALCIUM 8.1* 7.9* 8.4* 8.0*  8.2* 8.2*    RADIOLOGY STUDIES/RESULTS: No results found.   LOS: 26 days   Signature  -    Lala Lund M.D on 12/29/2022 at 10:36 AM   -  To page go to [email or web address]

## 2022-12-30 DIAGNOSIS — R7881 Bacteremia: Secondary | ICD-10-CM | POA: Diagnosis not present

## 2022-12-30 DIAGNOSIS — B9562 Methicillin resistant Staphylococcus aureus infection as the cause of diseases classified elsewhere: Secondary | ICD-10-CM | POA: Diagnosis not present

## 2022-12-30 LAB — CBC WITH DIFFERENTIAL/PLATELET
Abs Immature Granulocytes: 0.3 10*3/uL — ABNORMAL HIGH (ref 0.00–0.07)
Basophils Absolute: 0.1 10*3/uL (ref 0.0–0.1)
Basophils Relative: 0 %
Eosinophils Absolute: 0.5 10*3/uL (ref 0.0–0.5)
Eosinophils Relative: 3 %
HCT: 24.9 % — ABNORMAL LOW (ref 39.0–52.0)
Hemoglobin: 8.2 g/dL — ABNORMAL LOW (ref 13.0–17.0)
Immature Granulocytes: 2 %
Lymphocytes Relative: 13 %
Lymphs Abs: 2 10*3/uL (ref 0.7–4.0)
MCH: 28.9 pg (ref 26.0–34.0)
MCHC: 32.9 g/dL (ref 30.0–36.0)
MCV: 87.7 fL (ref 80.0–100.0)
Monocytes Absolute: 1.6 10*3/uL — ABNORMAL HIGH (ref 0.1–1.0)
Monocytes Relative: 11 %
Neutro Abs: 10.5 10*3/uL — ABNORMAL HIGH (ref 1.7–7.7)
Neutrophils Relative %: 71 %
Platelets: 321 10*3/uL (ref 150–400)
RBC: 2.84 MIL/uL — ABNORMAL LOW (ref 4.22–5.81)
RDW: 14.5 % (ref 11.5–15.5)
WBC: 14.9 10*3/uL — ABNORMAL HIGH (ref 4.0–10.5)
nRBC: 0 % (ref 0.0–0.2)

## 2022-12-30 LAB — BASIC METABOLIC PANEL
Anion gap: 12 (ref 5–15)
BUN: 40 mg/dL — ABNORMAL HIGH (ref 6–20)
CO2: 23 mmol/L (ref 22–32)
Calcium: 8.2 mg/dL — ABNORMAL LOW (ref 8.9–10.3)
Chloride: 104 mmol/L (ref 98–111)
Creatinine, Ser: 4.36 mg/dL — ABNORMAL HIGH (ref 0.61–1.24)
GFR, Estimated: 16 mL/min — ABNORMAL LOW (ref >60)
Glucose, Bld: 102 mg/dL — ABNORMAL HIGH (ref 70–99)
Potassium: 3 mmol/L — ABNORMAL LOW (ref 3.5–5.1)
Sodium: 139 mmol/L (ref 135–145)

## 2022-12-30 MED ORDER — POTASSIUM CHLORIDE CRYS ER 20 MEQ PO TBCR
40.0000 meq | EXTENDED_RELEASE_TABLET | Freq: Two times a day (BID) | ORAL | Status: AC
  Administered 2022-12-30 (×2): 40 meq via ORAL
  Filled 2022-12-30 (×2): qty 2

## 2022-12-30 NOTE — Progress Notes (Signed)
PROGRESS NOTE        PATIENT DETAILS Name: Hunter Reyes Age: 47 y.o. Sex: male Date of Birth: Jan 15, 1976 Admit Date: 12/02/2022 Admitting Physician [name], MD WJX:BJYN, Edwinna Areola, MD  Brief Summary: Patient is a 47 y.o.  male with history of bipolar disorder, crack cocaine use, peripheral neuropathy-who was transferred from [facility] for AKI/urinary retention-upon further evaluation-he was found to have MRSA bacteremia with right foot abscess requiring TMA, lumbar discitis/abscess-requiring emergent laminectomy.  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  Significant events: 12/10>> admit to [facility] at [facility]-AKI-urinary retention.  Plans to transfer to [facility]. 12/13>> laminectomy/decompression of ventral lumbar epidural abscess 12/17>> transmetatarsal amputation of right foot 12/21>>voiding trial-foley removed 12/22>>worsening renal function-urinary retention-foley replaced 12/25>>Urine protein creatinine ratio of 7.68-w hematuria-suggestion of new AKI is 2/2 infection GN 12/26>>Temp HD cath placed by IR-HD started  Significant studies: 12/11>> CT renal stone study: Possible discitis L2-L3/L3-L4, moderate bilateral hydronephrosis/hydroureter 12/13>> MRI L-spine: Discitis/osteomyelitis at L3-L4 with 9 mm thick ventral canal phlegmon/abscess which extends from L3 to L4-L5.  Multiple paraspinal abscesses within the psoas musculature. 12/13>> echo: EF 60-65%, no evidence of valvular vegetations. 12/14>> MRI left foot: No osteomyelitis/abscess 12/15>> MRI right foot: Abscess with osteomyelitis of second/third metatarsals. 12/25>>Urine protein creatinine ratio of 7.68 12/25>> ASO titer: Negative 12/25>> ANA: Negative 12/25>> ANCA titers: Negative 12/25>> GBM antibody: Negative 12/25>> C3/C4: Normal limits. 12/25>> HBV/HCV serology:   Significant microbiology data: 12/10>> COVID/influenza PCR:  Negative 12/11>> blood culture: MRSA 12/13>> blood culture: MRSA 12/13>> epidural abscess culture: MRSA 12/14>> blood culture: No growth  Procedures: 12/13>> right L4 laminectomy-sublaminar decompression of ventral epidural abscess. 12/17>> transmetatarsal amputation of right foot 12/26>>Right IJ Trialysis CVC placed by IR  Consults: ID Neurosurgery IR Renal  Subjective:  Patient in bed, appears comfortable, denies any headache, no fever, no chest pain or pressure, no shortness of breath , no abdominal pain. No new focal weakness.   Objective:  Vitals: Blood pressure 108/64, pulse 90, temperature 98 F (36.7 C), temperature source Oral, resp. rate 16, height 6\' 2"  (1.88 m), weight 134.1 kg, SpO2 95 %.   Exam:  Awake Alert, No new F.N deficits, Normal affect [place].AT,PERRAL Supple Neck, No JVD,   Symmetrical Chest wall movement, Good air movement bilaterally, CTAB RRR,No Gallops, Rubs or new Murmurs,  +ve B.Sounds, Abd Soft, No tenderness,   Right transmetatarsal partial amputation with wound VAC, 1+ edema, Foley catheter in place,    Assessment/Plan:  MRSA bacteremia, L3-L4 discitis with epidural abscess-s/p laminectomy/decompression on 12/05/22 - Right foot osteomyelitis/abscess-s/p transmetatarsal amputation on 12/09/22  -   Overall improved-repeat cultures negative so far, Orthopedics/neurosurgery following-able to lift both lower extremities off the bed. Continue to work with PT/OT.  ID recommending 8 weeks of IV Daptomycin - end date 02/03/2023, repeat MRI L spine on 01/02/23- and to reconsult ID around 1/10 to revisit  antibiotic plan following repeat imaging. Given history of drug use-unfortunately will remain inpatient to complete IV antibiotic duration.  Not a candidate for home therapies.   AKI initially from obstructive uropathy, Recurrent AKI lately likely due to infectious glomerulonephritis - Urinary retention-Foley removed on 12/21-failed voiding trial-Foley  catheter reinserted on 12/22. Great urine output overnight-nephrology following-on intermittent HD.  Per nephrology dialysis catheter was removed on 12/25/2022, renal function plateaued but slightly rising since 12/27/2023 discussed  with Dr. Joelyn Oms nephrologist on 12/27/2022 who will remotely monitor.  Will gently hydrate on 12/28/2022 - 12/29/22 as urine continues to look quite concentrated in the Foley bag, continue to monitor.   Hypokalemia, hypomagnesemia: Replete/recheck.  Normocytic anemia - Received 1 unit of packed RBC on 12/17/2022, H&H gradually dropping, anemia panel suggestive of mixed picture of some iron deficiency and relative B12 deficiency.  Placed on replacement.  No signs of bleeding.  On absurd continue to monitor.  Peripheral neuropathy - No longer on Neurontin-given worsening AKI.  Bipolar 1 disorder - Stable, Continue zoloft  Polysubstance abuse - History of cocaine use, able on present regimen at home was on Suboxone which was held earlier during this hospitalization.  Dental caries - Outpatient dentistry follow-up  Morbid Obesity: Estimated body mass index is 37.96 kg/m as calculated from the following:   Height as of this encounter: 6\' 2"  (1.88 m).   Weight as of this encounter: 134.1 kg.   Code status:   Code Status: Full Code   DVT Prophylaxis: heparin injection 5,000 Units Start: 12/16/22 1000 SCD's Start: 12/09/22 1137   Family Communication: None at bedside   Disposition Plan: Status is: Inpatient Remains inpatient appropriate because: Severity of illness   Planned Discharge Destination:Home ultimately-but will need to remain inpatient to complete IV daptomycin-end date 02/04/20   Diet: Diet Order             Diet renal with fluid restriction Fluid restriction: 1200 mL Fluid; Room service appropriate? Yes; Fluid consistency: Thin  Diet effective now                    MEDICATIONS: Scheduled Meds:  buprenorphine-naloxone  1 tablet Sublingual BID    Chlorhexidine Gluconate Cloth  6 each Topical Q0600   vitamin B-12  1,000 mcg Oral Daily   docusate sodium  200 mg Oral BID   ferrous sulfate  325 mg Oral BID WC   folic acid  1 mg Oral Daily   heparin injection (subcutaneous)  5,000 Units Subcutaneous Q8H   nutrition supplement (JUVEN)  1 packet Oral BID BM   pantoprazole  40 mg Oral Daily   polyethylene glycol  17 g Oral Daily   potassium chloride  40 mEq Oral BID   sertraline  25 mg Oral Daily   tamsulosin  0.4 mg Oral QPC supper   thiamine  500 mg Oral Daily   Continuous Infusions:  DAPTOmycin (CUBICIN) 850 mg in sodium chloride 0.9 % IVPB 850 mg (12/29/22 2100)   PRN Meds:.alum & mag hydroxide-simeth, bisacodyl, diphenhydrAMINE, guaiFENesin-dextromethorphan, ondansetron **OR** ondansetron (ZOFRAN) IV, oxyCODONE, phenol, tiZANidine, traZODone   I have personally reviewed following labs and imaging studies  LABORATORY DATA:  Recent Labs  Lab 12/26/22 0209 12/27/22 0234 12/28/22 0432 12/29/22 1047 12/30/22 0227  WBC 12.6* 13.9* 14.7* 15.0* 14.9*  HGB 8.2* 8.1* 8.5* 8.3* 8.2*  HCT 24.9* 26.3* 27.6* 25.3* 24.9*  PLT 431* 410* 382 345 321  MCV 88.3 91.0 90.2 87.8 87.7  MCH 29.1 28.0 27.8 28.8 28.9  MCHC 32.9 30.8 30.8 32.8 32.9  RDW 14.1 14.2 14.5 14.5 14.5  LYMPHSABS 2.9 2.5 2.3 2.0 2.0  MONOABS 1.6* 1.9* 1.7* 1.8* 1.6*  EOSABS 0.3 0.5 0.5 0.5 0.5  BASOSABS 0.3* 0.1 0.1 0.1 0.1    Recent Labs  Lab 12/24/22 0341 12/25/22 0342 12/26/22 0209 12/27/22 0234 12/28/22 0432 12/29/22 1047 12/30/22 0227  NA 141 143 142 140 140 138 139  K 3.1* 4.1  3.1* 3.7 3.9 3.4* 3.0*  CL 103 105 102 102 103 101 104  CO2 23 24 27 27 26 24 23   ANIONGAP 15 14 13 11 11 13 12   GLUCOSE 86 87 89 93 96 97 102*  BUN 54* 47* 45* 44* 45* 41* 40*  CREATININE 3.84* 3.68* 3.55* 4.00* 4.25* 4.36* 4.36*  BNP 102.1* 124.0* 46.3 24.8  --   --   --   MG 1.9 1.8 1.6* 2.4  --   --   --   CALCIUM 7.9* 8.4* 8.0* 8.2* 8.2* 8.0* 8.2*    RADIOLOGY  STUDIES/RESULTS: No results found.   LOS: 27 days   Signature  -    Lala Lund M.D on 12/30/2022 at 9:27 AM   -  To page go to [email or web address]

## 2022-12-31 ENCOUNTER — Inpatient Hospital Stay (HOSPITAL_COMMUNITY): Payer: Medicaid Other

## 2022-12-31 DIAGNOSIS — B9562 Methicillin resistant Staphylococcus aureus infection as the cause of diseases classified elsewhere: Secondary | ICD-10-CM | POA: Diagnosis not present

## 2022-12-31 DIAGNOSIS — R7881 Bacteremia: Secondary | ICD-10-CM | POA: Diagnosis not present

## 2022-12-31 LAB — CBC WITH DIFFERENTIAL/PLATELET
Abs Immature Granulocytes: 0.19 10*3/uL — ABNORMAL HIGH (ref 0.00–0.07)
Basophils Absolute: 0.1 10*3/uL (ref 0.0–0.1)
Basophils Relative: 1 %
Eosinophils Absolute: 0.5 10*3/uL (ref 0.0–0.5)
Eosinophils Relative: 4 %
HCT: 26.2 % — ABNORMAL LOW (ref 39.0–52.0)
Hemoglobin: 8.3 g/dL — ABNORMAL LOW (ref 13.0–17.0)
Immature Granulocytes: 1 %
Lymphocytes Relative: 12 %
Lymphs Abs: 1.8 10*3/uL (ref 0.7–4.0)
MCH: 28.2 pg (ref 26.0–34.0)
MCHC: 31.7 g/dL (ref 30.0–36.0)
MCV: 89.1 fL (ref 80.0–100.0)
Monocytes Absolute: 1.9 10*3/uL — ABNORMAL HIGH (ref 0.1–1.0)
Monocytes Relative: 13 %
Neutro Abs: 10.3 10*3/uL — ABNORMAL HIGH (ref 1.7–7.7)
Neutrophils Relative %: 69 %
Platelets: 310 10*3/uL (ref 150–400)
RBC: 2.94 MIL/uL — ABNORMAL LOW (ref 4.22–5.81)
RDW: 14.7 % (ref 11.5–15.5)
WBC: 14.8 10*3/uL — ABNORMAL HIGH (ref 4.0–10.5)
nRBC: 0 % (ref 0.0–0.2)

## 2022-12-31 LAB — BASIC METABOLIC PANEL
Anion gap: 10 (ref 5–15)
BUN: 40 mg/dL — ABNORMAL HIGH (ref 6–20)
CO2: 23 mmol/L (ref 22–32)
Calcium: 8.2 mg/dL — ABNORMAL LOW (ref 8.9–10.3)
Chloride: 107 mmol/L (ref 98–111)
Creatinine, Ser: 4.67 mg/dL — ABNORMAL HIGH (ref 0.61–1.24)
GFR, Estimated: 15 mL/min — ABNORMAL LOW (ref >60)
Glucose, Bld: 90 mg/dL (ref 70–99)
Potassium: 3.2 mmol/L — ABNORMAL LOW (ref 3.5–5.1)
Sodium: 140 mmol/L (ref 135–145)

## 2022-12-31 LAB — CK: Total CK: 981 U/L — ABNORMAL HIGH (ref 49–397)

## 2022-12-31 MED ORDER — POTASSIUM CHLORIDE CRYS ER 20 MEQ PO TBCR
40.0000 meq | EXTENDED_RELEASE_TABLET | Freq: Two times a day (BID) | ORAL | Status: AC
  Administered 2022-12-31 – 2023-01-01 (×2): 40 meq via ORAL
  Filled 2022-12-31 (×4): qty 2

## 2022-12-31 MED ORDER — SODIUM CHLORIDE 0.9 % IV SOLN
8.0000 mg/kg | INTRAVENOUS | Status: DC
  Administered 2023-01-02 – 2023-01-25 (×13): 850 mg via INTRAVENOUS
  Filled 2022-12-31 (×19): qty 17

## 2022-12-31 MED ORDER — GADOBUTROL 1 MMOL/ML IV SOLN
10.0000 mL | Freq: Once | INTRAVENOUS | Status: AC | PRN
  Administered 2022-12-31: 10 mL via INTRAVENOUS

## 2022-12-31 NOTE — Progress Notes (Signed)
PROGRESS NOTE        PATIENT DETAILS Name: Hunter Reyes Age: 47 y.o. Sex: male Date of Birth: 1976/02/16 Admit Date: 12/02/2022 Admitting Physician [name], MD HCW:CBJS, Edwinna Areola, MD  Brief Summary: Patient is a 47 y.o.  male with history of bipolar disorder, crack cocaine use, peripheral neuropathy-who was transferred from [facility] for AKI/urinary retention-upon further evaluation-he was found to have MRSA bacteremia with right foot abscess requiring TMA, lumbar discitis/abscess-requiring emergent laminectomy.  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  Significant events: 12/10>> admit to [facility] at [facility]-AKI-urinary retention.  Plans to transfer to [facility]. 12/13>> laminectomy/decompression of ventral lumbar epidural abscess 12/17>> transmetatarsal amputation of right foot 12/21>>voiding trial-foley removed 12/22>>worsening renal function-urinary retention-foley replaced 12/25>>Urine protein creatinine ratio of 7.68-w hematuria-suggestion of new AKI is 2/2 infection GN 12/26>>Temp HD cath placed by IR-HD started  Significant studies: 12/11>> CT renal stone study: Possible discitis L2-L3/L3-L4, moderate bilateral hydronephrosis/hydroureter 12/13>> MRI L-spine: Discitis/osteomyelitis at L3-L4 with 9 mm thick ventral canal phlegmon/abscess which extends from L3 to L4-L5.  Multiple paraspinal abscesses within the psoas musculature. 12/13>> echo: EF 60-65%, no evidence of valvular vegetations. 12/14>> MRI left foot: No osteomyelitis/abscess 12/15>> MRI right foot: Abscess with osteomyelitis of second/third metatarsals. 12/25>>Urine protein creatinine ratio of 7.68 12/25>> ASO titer: Negative 12/25>> ANA: Negative 12/25>> ANCA titers: Negative 12/25>> GBM antibody: Negative 12/25>> C3/C4: Normal limits. 12/25>> HBV/HCV serology:   Significant microbiology data: 12/10>> COVID/influenza PCR:  Negative 12/11>> blood culture: MRSA 12/13>> blood culture: MRSA 12/13>> epidural abscess culture: MRSA 12/14>> blood culture: No growth  Procedures: 12/13>> right L4 laminectomy-sublaminar decompression of ventral epidural abscess. 12/17>> transmetatarsal amputation of right foot 12/26>>Right IJ Trialysis CVC placed by IR  Consults: ID Neurosurgery IR Renal  Subjective:  Patient in bed, appears comfortable, denies any headache, no fever, no chest pain or pressure, no shortness of breath , no abdominal pain. No new focal weakness.   Objective:  Vitals: Blood pressure 138/81, pulse 88, temperature 98.4 F (36.9 C), temperature source Oral, resp. rate 16, height 6\' 2"  (1.88 m), weight 134.1 kg, SpO2 97 %.   Exam:  Awake Alert, No new F.N deficits, Normal affect [place].AT,PERRAL Supple Neck, No JVD,   Symmetrical Chest wall movement, Good air movement bilaterally, CTAB RRR,No Gallops, Rubs or new Murmurs,  +ve B.Sounds, Abd Soft, No tenderness,   Right transmetatarsal partial amputation with wound VAC, 1+ edema, Foley catheter in place,    Assessment/Plan:  MRSA bacteremia, L3-L4 discitis with epidural abscess-s/p laminectomy/decompression on 12/05/22 - Right foot osteomyelitis/abscess-s/p transmetatarsal amputation on 12/09/22  -   Overall improved-repeat cultures negative so far, Orthopedics/neurosurgery following-able to lift both lower extremities off the bed. Continue to work with PT/OT.  ID recommending 8 weeks of IV Daptomycin - end date 02/03/2023, repeat MRI L spine on 01/02/23- and to reconsult ID around 1/10 to revisit  antibiotic plan following repeat imaging. MRI ordered for 01/01/22 - Given history of drug use-unfortunately will remain inpatient to complete IV antibiotic duration.  Not a candidate for home therapies.   AKI initially from obstructive uropathy, Recurrent AKI lately likely due to infectious glomerulonephritis - Urinary retention-Foley removed on  12/21-failed voiding trial-Foley catheter reinserted on 12/22. Great urine output overnight-nephrology following-on intermittent HD.  Per nephrology dialysis catheter was removed on 12/25/2022, renal function plateaued but  slightly rising since 12/27/2023 discussed with Dr. Joelyn Oms nephrologist on 12/27/2022 who will remotely monitor.  Will gently hydrate on 12/28/2022 - 12/29/22 as urine continues to look quite concentrated in the Foley bag, Ur output > 2 lits/day with IVF, hydrate again 01/01/22.   Hypokalemia, hypomagnesemia: Replete/recheck.  Normocytic anemia - Received 1 unit of packed RBC on 12/17/2022, H&H gradually dropping, anemia panel suggestive of mixed picture of some iron deficiency and relative B12 deficiency.  Placed on replacement.  No signs of bleeding.  On absurd continue to monitor.  Peripheral neuropathy - No longer on Neurontin-given worsening AKI.  Bipolar 1 disorder - Stable, Continue zoloft  Polysubstance abuse - History of cocaine use, able on present regimen at home was on Suboxone which was held earlier during this hospitalization.  Dental caries - Outpatient dentistry follow-up  Morbid Obesity: Estimated body mass index is 37.96 kg/m as calculated from the following:   Height as of this encounter: 6\' 2"  (1.88 m).   Weight as of this encounter: 134.1 kg.   Code status:   Code Status: Full Code   DVT Prophylaxis: heparin injection 5,000 Units Start: 12/16/22 1000 SCD's Start: 12/09/22 1137   Family Communication: None at bedside   Disposition Plan: Status is: Inpatient Remains inpatient appropriate because: Severity of illness   Planned Discharge Destination:Home ultimately-but will need to remain inpatient to complete IV daptomycin-end date 02/04/20   Diet: Diet Order             Diet renal with fluid restriction Fluid restriction: 1200 mL Fluid; Room service appropriate? Yes; Fluid consistency: Thin  Diet effective now                     MEDICATIONS: Scheduled Meds:  buprenorphine-naloxone  1 tablet Sublingual BID   Chlorhexidine Gluconate Cloth  6 each Topical Q0600   vitamin B-12  1,000 mcg Oral Daily   docusate sodium  200 mg Oral BID   ferrous sulfate  325 mg Oral BID WC   folic acid  1 mg Oral Daily   heparin injection (subcutaneous)  5,000 Units Subcutaneous Q8H   nutrition supplement (JUVEN)  1 packet Oral BID BM   pantoprazole  40 mg Oral Daily   polyethylene glycol  17 g Oral Daily   potassium chloride  40 mEq Oral BID   sertraline  25 mg Oral Daily   tamsulosin  0.4 mg Oral QPC supper   thiamine  500 mg Oral Daily   Continuous Infusions:  DAPTOmycin (CUBICIN) 850 mg in sodium chloride 0.9 % IVPB 850 mg (12/30/22 2252)   PRN Meds:.alum & mag hydroxide-simeth, bisacodyl, diphenhydrAMINE, guaiFENesin-dextromethorphan, ondansetron **OR** ondansetron (ZOFRAN) IV, oxyCODONE, phenol, tiZANidine, traZODone   I have personally reviewed following labs and imaging studies  LABORATORY DATA:  Recent Labs  Lab 12/27/22 0234 12/28/22 0432 12/29/22 1047 12/30/22 0227 12/31/22 0634  WBC 13.9* 14.7* 15.0* 14.9* 14.8*  HGB 8.1* 8.5* 8.3* 8.2* 8.3*  HCT 26.3* 27.6* 25.3* 24.9* 26.2*  PLT 410* 382 345 321 310  MCV 91.0 90.2 87.8 87.7 89.1  MCH 28.0 27.8 28.8 28.9 28.2  MCHC 30.8 30.8 32.8 32.9 31.7  RDW 14.2 14.5 14.5 14.5 14.7  LYMPHSABS 2.5 2.3 2.0 2.0 1.8  MONOABS 1.9* 1.7* 1.8* 1.6* 1.9*  EOSABS 0.5 0.5 0.5 0.5 0.5  BASOSABS 0.1 0.1 0.1 0.1 0.1    Recent Labs  Lab 12/25/22 0342 12/26/22 0209 12/27/22 0234 12/28/22 0432 12/29/22 1047 12/30/22 0227 12/31/22 6270  NA 143 142 140 140 138 139 140  K 4.1 3.1* 3.7 3.9 3.4* 3.0* 3.2*  CL 105 102 102 103 101 104 107  CO2 24 27 27 26 24 23 23   ANIONGAP 14 13 11 11 13 12 10   GLUCOSE 87 89 93 96 97 102* 90  BUN 47* 45* 44* 45* 41* 40* 40*  CREATININE 3.68* 3.55* 4.00* 4.25* 4.36* 4.36* 4.67*  BNP 124.0* 46.3 24.8  --   --   --   --   MG 1.8 1.6* 2.4   --   --   --   --   CALCIUM 8.4* 8.0* 8.2* 8.2* 8.0* 8.2* 8.2*    RADIOLOGY STUDIES/RESULTS: No results found.   LOS: 28 days   Signature  -    Lala Lund M.D on 12/31/2022 at 9:10 AM   -  To page go to [email or web address]

## 2022-12-31 NOTE — Progress Notes (Signed)
Pharmacy Antibiotic Note  Hunter Reyes is a 47 y.o. male admitted on 12/02/2022 with disseminated MRSA bacteremia and epidural abscess with discitis/osteomyelitis. Pharmacy has been consulted for Daptomycin dosing. Repeat blood culture on 12/14 no growth. ID team following and planning end date 02/03/23 (8 weeks from 12/09/22).  AKI, Daptomycin regimen adjusted from q24h to Q48h on 12/15/22. [place] placed 12/26 and hemodialysis done.  Creatinine now improving.  12/31: Q48 regimen remains appropriate for renal function, Scr 4.33 today. Resulting in a CrCl of 31.5 mL/min today, patient just slightly above cut off to increase to Q24 hour dosing.  Last dose given 12/30 PM, next dose would be due 12/24/21 at 2000 without adjustment. Patient is afebrile with normal HR and BP. Urine output increasing, indicating possible renal recovery.  CK 134 on 12/28, within normal limits, next measurement 12/27/2021.   12/31/22:   on Daptomycin 850 mg (~ 8mg /kg adjBW) IV q24hr.  On Daptomycin  since 12/05/22. AKI:  Scr increased to 4.67 today.  CrCl 29.1 ml/min. (AdjBW) Weekly CK today increased to 981  (1/8).  Increased from 88 last week (1/1).  Today's CK remains less than 5x the upper normal limit, thus okay to continue.  Will monitor CK more frequently.   Will adjust Dapto dosing interval form q24hr to q48 hr due to CrCl is borderline for q24h vs q48h, Scr trending back up.   Plan:  Will adjust Daptomycin to 850 mg (~8 mg/kg adjBW) IV to every 48 hours,based on  renal function, crcl 29 ml/min. Next Dose due  tomorrow  1/9 @ 23:00. Will recheck  CK on Wed 1/10 then plan CK every Mon and Friday. . Follow renal function   Height: 6\' 2"  (188 cm) Weight: 134.1 kg (295 lb 10.2 oz) IBW/kg (Calculated) : 82.2  Temp (24hrs), Avg:98.3 F (36.8 C), Min:98.2 F (36.8 C), Max:98.4 F (36.9 C)  Recent Labs  Lab 12/27/22 0234 12/28/22 0432 12/29/22 1047 12/30/22 0227 12/31/22 0634  WBC 13.9* 14.7* 15.0* 14.9* 14.8*   CREATININE 4.00* 4.25* 4.36* 4.36* 4.67*     Estimated Creatinine Clearance: 29.1 mL/min (A) (by C-G formula based on SCr of 4.67 mg/dL (H)).    Allergies  Allergen Reactions   Darvocet [Propoxyphene N-Acetaminophen] Hives and Itching   Tramadol Hives and Itching   Other Hives    Antimicrobials this admission: Ceftriaxone 12/11 x 1 dose Vancomycin 12/11 x 1 dose Cefepime 12/11>>12/12 Metronidazole 12/11>>12/12 Daptomycin 12/13 >>(02/03/23)  CK now QThursday 12/11: 40 12/13: 404 12/14: 201 12/20: 90  12/21: 84 12/28: 134 1/1: 88 (random, off schedule measurement)  1/8: 981  (less than 5x upper normal> okay> will recheck CK on 01/02/23  Microbiology results: 12/03 urine: >100K/ml MRSA and Staph epi 12/10 COVID, flu and RSV: negative 12/11 blood: MRSA 12/11 urine: 20K/ml MRSA 12/13 blood: MRSA and Staph epi 12/13 abscess(lumbar 4 epidural space): rare MRSA  12/14 blood: negative  Thank you for allowing pharmacy to be a part of this patient's care.  Vicenta Dunning, PharmD  PGY1 Pharmacy Resident

## 2023-01-01 DIAGNOSIS — M4647 Discitis, unspecified, lumbosacral region: Secondary | ICD-10-CM | POA: Diagnosis not present

## 2023-01-01 DIAGNOSIS — G062 Extradural and subdural abscess, unspecified: Secondary | ICD-10-CM

## 2023-01-01 DIAGNOSIS — B9562 Methicillin resistant Staphylococcus aureus infection as the cause of diseases classified elsewhere: Secondary | ICD-10-CM | POA: Diagnosis not present

## 2023-01-01 DIAGNOSIS — R7881 Bacteremia: Secondary | ICD-10-CM | POA: Diagnosis not present

## 2023-01-01 DIAGNOSIS — L02611 Cutaneous abscess of right foot: Secondary | ICD-10-CM | POA: Diagnosis not present

## 2023-01-01 DIAGNOSIS — Z9889 Other specified postprocedural states: Secondary | ICD-10-CM | POA: Diagnosis not present

## 2023-01-01 LAB — BASIC METABOLIC PANEL
Anion gap: 15 (ref 5–15)
BUN: 43 mg/dL — ABNORMAL HIGH (ref 6–20)
CO2: 22 mmol/L (ref 22–32)
Calcium: 8.2 mg/dL — ABNORMAL LOW (ref 8.9–10.3)
Chloride: 102 mmol/L (ref 98–111)
Creatinine, Ser: 5 mg/dL — ABNORMAL HIGH (ref 0.61–1.24)
GFR, Estimated: 14 mL/min — ABNORMAL LOW (ref >60)
Glucose, Bld: 89 mg/dL (ref 70–99)
Potassium: 3.3 mmol/L — ABNORMAL LOW (ref 3.5–5.1)
Sodium: 139 mmol/L (ref 135–145)

## 2023-01-01 LAB — CBC WITH DIFFERENTIAL/PLATELET
Abs Immature Granulocytes: 0.16 10*3/uL — ABNORMAL HIGH (ref 0.00–0.07)
Basophils Absolute: 0.1 10*3/uL (ref 0.0–0.1)
Basophils Relative: 1 %
Eosinophils Absolute: 0.6 10*3/uL — ABNORMAL HIGH (ref 0.0–0.5)
Eosinophils Relative: 4 %
HCT: 25.7 % — ABNORMAL LOW (ref 39.0–52.0)
Hemoglobin: 8.1 g/dL — ABNORMAL LOW (ref 13.0–17.0)
Immature Granulocytes: 1 %
Lymphocytes Relative: 13 %
Lymphs Abs: 2 10*3/uL (ref 0.7–4.0)
MCH: 28.2 pg (ref 26.0–34.0)
MCHC: 31.5 g/dL (ref 30.0–36.0)
MCV: 89.5 fL (ref 80.0–100.0)
Monocytes Absolute: 2.2 10*3/uL — ABNORMAL HIGH (ref 0.1–1.0)
Monocytes Relative: 15 %
Neutro Abs: 10.1 10*3/uL — ABNORMAL HIGH (ref 1.7–7.7)
Neutrophils Relative %: 66 %
Platelets: 286 10*3/uL (ref 150–400)
RBC: 2.87 MIL/uL — ABNORMAL LOW (ref 4.22–5.81)
RDW: 14.6 % (ref 11.5–15.5)
WBC: 15.2 10*3/uL — ABNORMAL HIGH (ref 4.0–10.5)
nRBC: 0 % (ref 0.0–0.2)

## 2023-01-01 MED ORDER — POTASSIUM CHLORIDE CRYS ER 20 MEQ PO TBCR
40.0000 meq | EXTENDED_RELEASE_TABLET | Freq: Two times a day (BID) | ORAL | Status: AC
  Administered 2023-01-01 (×2): 40 meq via ORAL
  Filled 2023-01-01 (×2): qty 2

## 2023-01-01 MED ORDER — LACTATED RINGERS IV SOLN: INTRAVENOUS | Status: AC

## 2023-01-01 NOTE — Progress Notes (Addendum)
PROGRESS NOTE        PATIENT DETAILS Name: Hunter Reyes Age: 47 y.o. Sex: male Date of Birth: Nov 26, 1976 Admit Date: 12/02/2022 Admitting Physician [name], MD [id number], Edwinna Areola, MD  Brief Summary: Patient is a 47 y.o.  male with history of bipolar disorder, crack cocaine use, peripheral neuropathy-who was transferred from [facility] for AKI/urinary retention-upon further evaluation-he was found to have MRSA bacteremia with right foot abscess requiring TMA, lumbar discitis/abscess-requiring emergent laminectomy.  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  Significant events: 12/10>> admit to [facility]-AKI-urinary retention.  Plans to transfer to [facility]. 12/13>> laminectomy/decompression of ventral lumbar epidural abscess 12/17>> transmetatarsal amputation of right foot 12/21>>voiding trial-foley removed 12/22>>worsening renal function-urinary retention-foley replaced 12/25>>Urine protein creatinine ratio of 7.68-w hematuria-suggestion of new AKI is 2/2 infection GN 12/26>>Temp HD cath placed by IR-HD started  Significant studies: 12/11>> CT renal stone study: Possible discitis L2-L3/L3-L4, moderate bilateral hydronephrosis/hydroureter 12/13>> MRI L-spine: Discitis/osteomyelitis at L3-L4 with 9 mm thick ventral canal phlegmon/abscess which extends from L3 to L4-L5.  Multiple paraspinal abscesses within the psoas musculature. 12/13>> echo: EF 60-65%, no evidence of valvular vegetations. 12/14>> MRI left foot: No osteomyelitis/abscess 12/15>> MRI right foot: Abscess with osteomyelitis of second/third metatarsals. 12/25>>Urine protein creatinine ratio of 7.68 12/25>> ASO titer: Negative 12/25>> ANA: Negative 12/25>> ANCA titers: Negative 12/25>> GBM antibody: Negative 12/25>> C3/C4: Normal limits. 12/25>> HBV/HCV serology:  12/31/22 >>MRI - 1. Interval decrease in size of the epidural abscess at the  L4 level, now measuring up to 2.4 x 0.9 cm, previously 2.7 x 0.9 cm. The degree of spinal canal stenosis at the L3-L4 level is also improved, now moderate, previously severe. 2. 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 3. Heterogeneous appearance of the right kidney with interval decrease in size of previously seen T2 hyperintense structures, favored to represent evolving and resolving infection  Significant microbiology data: 12/10>> COVID/influenza PCR: Negative 12/11>> blood culture: MRSA 12/13>> blood culture: MRSA 12/13>> epidural abscess culture: MRSA 12/14>> blood culture: No growth  Procedures: 12/13>> right L4 laminectomy-sublaminar decompression of ventral epidural abscess. 12/17>> transmetatarsal amputation of right foot 12/26>>Right IJ Trialysis CVC placed by IR  Consults: ID Neurosurgery IR Renal  Subjective:  Patient in bed, appears comfortable, denies any headache, no fever, no chest pain or pressure, no shortness of breath , no abdominal pain. No new focal weakness.   Objective:  Vitals: Blood pressure (!) 110/57, pulse 68, temperature (!) 97.5 F (36.4 C), temperature source Oral, resp. rate 17, height 6\' 2"  (1.88 m), weight 134.1 kg, SpO2 95 %.   Exam:  Awake Alert, No new F.N deficits, Normal affect [place].AT,PERRAL Supple Neck, No JVD,   Symmetrical Chest wall movement, Good air movement bilaterally, CTAB RRR,No Gallops, Rubs or new Murmurs,  +ve B.Sounds, Abd Soft, No tenderness,   Right transmetatarsal partial amputation with wound VAC, 1+ edema, Foley catheter in place,    Assessment/Plan:  MRSA bacteremia, L3-L4 discitis with epidural abscess-s/p laminectomy/decompression on 12/05/22 - Right foot osteomyelitis/abscess-s/p transmetatarsal amputation on 12/09/22  -    Overall improved-repeat cultures negative so far, Orthopedics/neurosurgery following-able to lift both lower extremities off the bed. Continue to work  with PT/OT.  ID recommending 8 weeks of IV Daptomycin - end date 02/03/2023, repeat MRI L spine on 01/02/23- and to reconsult ID around 1/10 to revisit  antibiotic plan for repeat MRI was done on 12/31/2022, was done per radiology protocol checked with MRI tech [place] >> radiologist Dr. Tobias Alexander, results suggest improving infection - Given history of drug use-unfortunately will remain inpatient to complete IV antibiotic duration.  Not a candidate for home therapies.  Will request ID to reevaluate him on 01/01/2023.   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 Great urine output overnight-nephrology following-on intermittent HD.  Per nephrology dialysis catheter was removed on 12/25/2022, renal function plateaued but slightly rising since 12/27/2023 discussed with Dr. Joelyn Oms nephrologist on 12/27/2022 who will remotely monitor.  Will gently hydrate on 12/28/2022 - 12/29/22 as urine continues to look quite concentrated in the Foley bag, Ur output > 2 lits/day with IVF, hydrate again 01/01/22.   Hypokalemia, hypomagnesemia: Replete/recheck.  Normocytic anemia - Received 1 unit of packed RBC on 12/17/2022, H&H gradually dropping, anemia panel suggestive of mixed picture of some iron deficiency and relative B12 deficiency.  Placed on replacement.  No signs of bleeding.  On absurd continue to monitor.  Peripheral neuropathy - No longer on Neurontin-given worsening AKI.  Bipolar 1 disorder - Stable, Continue zoloft  Polysubstance abuse - History of cocaine use, able on present regimen at home was on Suboxone which was held earlier during this hospitalization.  Dental caries - Outpatient dentistry  follow-up  Morbid Obesity: Estimated body mass index is 37.96 kg/m as calculated from the following:   Height as of this encounter: 6\' 2"  (1.88 m).   Weight as of this encounter: 134.1 kg.   Code status:   Code Status: Full Code   DVT Prophylaxis: heparin injection 5,000 Units Start: 12/16/22 1000 SCD's Start: 12/09/22 1137   Family Communication: None at bedside   Disposition Plan: Status is: Inpatient Remains inpatient appropriate because: Severity of illness   Planned Discharge Destination:Home ultimately-but will need to remain inpatient to complete IV daptomycin-end date 02/04/20   Diet: Diet Order             Diet renal with fluid restriction Fluid restriction: 1200 mL Fluid; Room service appropriate? Yes; Fluid consistency: Thin  Diet effective now                    MEDICATIONS: Scheduled Meds:  buprenorphine-naloxone  1 tablet Sublingual BID   Chlorhexidine Gluconate Cloth  6 each Topical Q0600   vitamin B-12  1,000 mcg Oral Daily   docusate sodium  200 mg Oral BID   ferrous sulfate  325 mg Oral BID WC   folic acid  1 mg Oral Daily   heparin injection (subcutaneous)  5,000 Units Subcutaneous Q8H   nutrition supplement (JUVEN)  1 packet Oral BID BM   pantoprazole  40 mg Oral Daily   polyethylene glycol  17 g Oral Daily   potassium chloride  40 mEq Oral BID   sertraline  25 mg Oral Daily   tamsulosin  0.4 mg Oral QPC supper   thiamine  500 mg Oral Daily   Continuous Infusions:  DAPTOmycin (CUBICIN) 850 mg in sodium chloride 0.9 % IVPB     lactated ringers     PRN Meds:.alum & mag hydroxide-simeth, bisacodyl, diphenhydrAMINE, guaiFENesin-dextromethorphan, ondansetron **OR** ondansetron (ZOFRAN) IV, oxyCODONE,  phenol, tiZANidine, traZODone   I have personally reviewed following labs and imaging studies  LABORATORY DATA:  Recent Labs  Lab 12/28/22 0432 12/29/22 1047 12/30/22 0227 12/31/22 0634 01/01/23 0316  WBC 14.7* 15.0* 14.9* 14.8* 15.2*   HGB 8.5* 8.3* 8.2* 8.3* 8.1*  HCT 27.6* 25.3* 24.9* 26.2* 25.7*  PLT 382 345 321 310 286  MCV 90.2 87.8 87.7 89.1 89.5  MCH 27.8 28.8 28.9 28.2 28.2  MCHC 30.8 32.8 32.9 31.7 31.5  RDW 14.5 14.5 14.5 14.7 14.6  LYMPHSABS 2.3 2.0 2.0 1.8 2.0  MONOABS 1.7* 1.8* 1.6* 1.9* 2.2*  EOSABS 0.5 0.5 0.5 0.5 0.6*  BASOSABS 0.1 0.1 0.1 0.1 0.1    Recent Labs  Lab 12/26/22 0209 12/27/22 0234 12/28/22 0432 12/29/22 1047 12/30/22 0227 12/31/22 0634 01/01/23 0316  NA 142 140 140 138 139 140 139  K 3.1* 3.7 3.9 3.4* 3.0* 3.2* 3.3*  CL 102 102 103 101 104 107 102  CO2 27 27 26 24 23 23 22   ANIONGAP 13 11 11 13 12 10 15   GLUCOSE 89 93 96 97 102* 90 89  BUN 45* 44* 45* 41* 40* 40* 43*  CREATININE 3.55* 4.00* 4.25* 4.36* 4.36* 4.67* 5.00*  BNP 46.3 24.8  --   --   --   --   --   MG 1.6* 2.4  --   --   --   --   --   CALCIUM 8.0* 8.2* 8.2* 8.0* 8.2* 8.2* 8.2*    RADIOLOGY STUDIES/RESULTS: MR Lumbar Spine W Wo Contrast  Result Date: 12/31/2022 CLINICAL DATA:  Low back pain. Bacteremia. 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 EXAM: MRI LUMBAR SPINE WITHOUT AND WITH CONTRAST TECHNIQUE: Multiplanar and multiecho pulse sequences of the lumbar spine were obtained without and with intravenous contrast. CONTRAST:  92mL GADAVIST GADOBUTROL 1 MMOL/ML IV SOLN COMPARISON:  MRI L Spine 12/05/22 FINDINGS: Segmentation:  Standard. Alignment:  Physiologic. Vertebrae: Interval postsurgical changes from L4 laminectomy. There is persistent abnormal T2/stir hyperintense signal within the L3-L4 disc space and abnormal contrast enhancement in the L3-L4 vertebral bodies as well as the prevertebral space at these levels. There is now abnormal T2/stir hyperintense signal in the interspinous space at the L3-L4 levels as well as the subcutaneous soft tissues, which is nonspecific, and may be due to recent surgical intervention, but superimposed infectious not excluded. Conus  medullaris and cauda equina: Conus extends to the L1 level. Conus and cauda equina appear normal. Paraspinal and other soft tissues: There are multiple small fluid collections in the bilateral psoas muscle bodies (series 9, image 28, 34, 26). There is also a 3.0 x 1.0 cm soft tissue abscess in the prevertebral space (series 9, image 20). That likely slightly decreased in size compared to prior exam. Heterogeneous appearance of the right kidney with interval decrease in size of previously seen T2 hyperintense structures, favored to represent evolving and resolving infection Disc levels: Compared to prior exam there is interval decrease in size of the epidural fluid collection at the L4 level, now measuring up to 2.4 x 0.9 cm, previously 2.7 x 0.9 cm. The degree of spinal canal stenosis at the L3-L4 level is also improved, now moderate, previously severe Otherwise there is no significant change in the degree of degenerative change with persistent severe narrowing of the right lateral recess and bilateral neuroforamina at the L5-S1 level IMPRESSION: 1. Interval decrease in size of the epidural abscess at the L4 level,  now measuring up to 2.4 x 0.9 cm, previously 2.7 x 0.9 cm. The degree of spinal canal stenosis at the L3-L4 level is also improved, now moderate, previously severe. 2. 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 3. Heterogeneous appearance of the right kidney with interval decrease in size of previously seen T2 hyperintense structures, favored to represent evolving and resolving infection. Recommend correlation with urinalysis and renal ultrasound for further evaluation. Electronically Signed   By: Marin Roberts M.D.   On: 12/31/2022 15:56     LOS: 29 days   Signature  -    Lala Lund M.D on 01/01/2023 at 8:33 AM    -  To page go to [email or web address]

## 2023-01-01 NOTE — Progress Notes (Signed)
Subjective: No new complaints   Antibiotics:  Anti-infectives (From admission, onward)    Start     Dose/Rate Route Frequency Ordered Stop   01/01/23 2300  DAPTOmycin (CUBICIN) 850 mg in sodium chloride 0.9 % IVPB        8 mg/kg  108.9 kg (Adjusted) 134 mL/hr over 30 Minutes Intravenous Every 48 hours 12/31/22 1624     12/23/22 2000  DAPTOmycin (CUBICIN) 850 mg in sodium chloride 0.9 % IVPB  Status:  Discontinued        8 mg/kg  108.9 kg (Adjusted) 134 mL/hr over 30 Minutes Intravenous Every 24 hours 12/23/22 0821 12/31/22 1624   12/16/22 2000  DAPTOmycin (CUBICIN) 850 mg in sodium chloride 0.9 % IVPB  Status:  Discontinued        8 mg/kg  108.9 kg (Adjusted) 134 mL/hr over 30 Minutes Intravenous Every 48 hours 12/15/22 1145 12/23/22 0821   12/05/22 1800  DAPTOmycin (CUBICIN) 850 mg in sodium chloride 0.9 % IVPB  Status:  Discontinued        8 mg/kg  108.9 kg (Adjusted) 134 mL/hr over 30 Minutes Intravenous Daily 12/05/22 1654 12/15/22 1145   12/05/22 1200  DAPTOmycin (CUBICIN) 850 mg in sodium chloride 0.9 % IVPB  Status:  Discontinued        8 mg/kg  108.9 kg (Adjusted) 134 mL/hr over 30 Minutes Intravenous Daily 12/05/22 0923 12/05/22 1654   12/05/22 0800  vancomycin (VANCOREADY) IVPB 2000 mg/400 mL  Status:  Discontinued        2,000 mg 200 mL/hr over 120 Minutes Intravenous Every 24 hours 12/05/22 0632 12/05/22 0923   12/04/22 2200  ceFEPIme (MAXIPIME) 2 g in sodium chloride 0.9 % 100 mL IVPB  Status:  Discontinued        2 g 200 mL/hr over 30 Minutes Intravenous Every 12 hours 12/04/22 1242 12/05/22 0237   12/04/22 0600  ceFEPIme (MAXIPIME) 1 g in sodium chloride 0.9 % 100 mL IVPB  Status:  Discontinued        1 g 200 mL/hr over 30 Minutes Intravenous Every 24 hours 12/03/22 0508 12/04/22 1242   12/03/22 1100  ceFEPIme (MAXIPIME) 2 g in sodium chloride 0.9 % 100 mL IVPB        2 g 200 mL/hr over 30 Minutes Intravenous  Once 12/03/22 0418 12/03/22 1120    12/03/22 0508  vancomycin variable dose per unstable renal function (pharmacist dosing)  Status:  Discontinued         Does not apply See admin instructions 12/03/22 0508 12/05/22 0849   12/03/22 0500  metroNIDAZOLE (FLAGYL) IVPB 500 mg  Status:  Discontinued        500 mg 100 mL/hr over 60 Minutes Intravenous Every 12 hours 12/03/22 0418 12/05/22 0237   12/03/22 0430  vancomycin (VANCOCIN) IVPB 1000 mg/200 mL premix  Status:  Discontinued        1,000 mg 200 mL/hr over 60 Minutes Intravenous  Once 12/03/22 0418 12/03/22 0421   12/03/22 0415  vancomycin (VANCOREADY) IVPB 2000 mg/400 mL        2,000 mg 200 mL/hr over 120 Minutes Intravenous  Once 12/03/22 0404 12/03/22 0630   12/03/22 0200  cefTRIAXone (ROCEPHIN) 2 g in sodium chloride 0.9 % 100 mL IVPB        2 g 200 mL/hr over 30 Minutes Intravenous  Once 12/03/22 0153 12/03/22 0238       Medications: Scheduled Meds:  buprenorphine-naloxone  1 tablet Sublingual BID   Chlorhexidine Gluconate Cloth  6 each Topical Q0600   vitamin B-12  1,000 mcg Oral Daily   docusate sodium  200 mg Oral BID   ferrous sulfate  325 mg Oral BID WC   folic acid  1 mg Oral Daily   heparin injection (subcutaneous)  5,000 Units Subcutaneous Q8H   nutrition supplement (JUVEN)  1 packet Oral BID BM   pantoprazole  40 mg Oral Daily   polyethylene glycol  17 g Oral Daily   potassium chloride  40 mEq Oral BID   sertraline  25 mg Oral Daily   tamsulosin  0.4 mg Oral QPC supper   thiamine  500 mg Oral Daily   Continuous Infusions:  DAPTOmycin (CUBICIN) 850 mg in sodium chloride 0.9 % IVPB     lactated ringers 100 mL/hr at 01/01/23 0844   PRN Meds:.alum & mag hydroxide-simeth, bisacodyl, diphenhydrAMINE, guaiFENesin-dextromethorphan, ondansetron **OR** ondansetron (ZOFRAN) IV, oxyCODONE, phenol, tiZANidine, traZODone    Objective: Weight change:   Intake/Output Summary (Last 24 hours) at 01/01/2023 1822 Last data filed at 01/01/2023 4656 Gross per 24 hour   Intake 600 ml  Output 1250 ml  Net -650 ml   Blood pressure 129/77, pulse 76, temperature 98 F (36.7 C), temperature source Oral, resp. rate 16, height 6\' 2"  (1.88 m), weight 134.1 kg, SpO2 95 %. Temp:  [97.5 F (36.4 C)-98.5 F (36.9 C)] 98 F (36.7 C) (01/09 1630) Pulse Rate:  [68-83] 76 (01/09 1630) Resp:  [16-17] 16 (01/09 1630) BP: (110-129)/(57-77) 129/77 (01/09 1630) SpO2:  [91 %-100 %] 95 % (01/09 1630)  Physical Exam: Physical Exam Constitutional:      Appearance: He is well-developed.  HENT:     Head: Normocephalic and atraumatic.  Eyes:     Conjunctiva/sclera: Conjunctivae normal.  Cardiovascular:     Rate and Rhythm: Normal rate and regular rhythm.  Pulmonary:     Effort: Pulmonary effort is normal. No respiratory distress.     Breath sounds: No wheezing.  Abdominal:     General: There is no distension.     Palpations: Abdomen is soft.  Musculoskeletal:     Cervical back: Normal range of motion and neck supple.  Skin:    General: Skin is warm and dry.     Findings: No erythema or rash.  Neurological:     General: No focal deficit present.     Mental Status: He is alert and oriented to person, place, and time.  Psychiatric:        Mood and Affect: Mood normal.        Behavior: Behavior normal.        Thought Content: Thought content normal.        Judgment: Judgment normal.     Lett TMA site with vacuum dressing  CBC:    BMET Recent Labs    12/31/22 0634 01/01/23 0316  NA 140 139  K 3.2* 3.3*  CL 107 102  CO2 23 22  GLUCOSE 90 89  BUN 40* 43*  CREATININE 4.67* 5.00*  CALCIUM 8.2* 8.2*     Liver Panel  No results for input(s): "PROT", "ALBUMIN", "AST", "ALT", "ALKPHOS", "BILITOT", "BILIDIR", "IBILI" in the last 72 hours.     Sedimentation Rate No results for input(s): "ESRSEDRATE" in the last 72 hours. C-Reactive Protein No results for input(s): "CRP" in the last 72 hours.  Micro Results: Recent Results (from the past 720  hour(s))  Resp Panel by RT-PCR (  Flu A&B, Covid) Anterior Nasal Swab     Status: None   Collection Time: 12/02/22 11:30 PM   Specimen: Anterior Nasal Swab  Result Value Ref Range Status   SARS Coronavirus 2 by RT PCR NEGATIVE NEGATIVE Final    Comment: (NOTE) SARS-CoV-2 target nucleic acids are NOT DETECTED.  The SARS-CoV-2 RNA is generally detectable in upper respiratory specimens during the acute phase of infection. The lowest concentration of SARS-CoV-2 viral copies this assay can detect is 138 copies/mL. A negative result does not preclude SARS-Cov-2 infection and should not be used as the sole basis for treatment or other patient management decisions. A negative result may occur with  improper specimen collection/handling, submission of specimen other than nasopharyngeal swab, presence of viral mutation(s) within the areas targeted by this assay, and inadequate number of viral copies(<138 copies/mL). A negative result must be combined with clinical observations, patient history, and epidemiological information. The expected result is Negative.  Fact Sheet for Patients:  EntrepreneurPulse.com.au  Fact Sheet for Healthcare Providers:  IncredibleEmployment.be  This test is no t yet approved or cleared by the [place] FDA and  has been authorized for detection and/or diagnosis of SARS-CoV-2 by FDA under an Emergency Use Authorization (EUA). This EUA will remain  in effect (meaning this test can be used) for the duration of the COVID-19 declaration under Section 564(b)(1) of the Act, 21 U.S.C.section 360bbb-3(b)(1), unless the authorization is terminated  or revoked sooner.       Influenza A by PCR NEGATIVE NEGATIVE Final   Influenza B by PCR NEGATIVE NEGATIVE Final    Comment: (NOTE) The Xpert Xpress SARS-CoV-2/FLU/RSV plus assay is intended as an aid in the diagnosis of influenza from Nasopharyngeal swab specimens and should not be  used as a sole basis for treatment. Nasal washings and aspirates are unacceptable for Xpert Xpress SARS-CoV-2/FLU/RSV testing.  Fact Sheet for Patients: EntrepreneurPulse.com.au  Fact Sheet for Healthcare Providers: IncredibleEmployment.be  This test is not yet approved or cleared by the [place] FDA and has been authorized for detection and/or diagnosis of SARS-CoV-2 by FDA under an Emergency Use Authorization (EUA). This EUA will remain in effect (meaning this test can be used) for the duration of the COVID-19 declaration under Section 564(b)(1) of the Act, 21 U.S.C. section 360bbb-3(b)(1), unless the authorization is terminated or revoked.  Performed at [facility], [address], [place], [place] [address]   Blood culture (routine x 2)     Status: Abnormal   Collection Time: 12/03/22  4:00 AM   Specimen: Site Not Specified; Blood  Result Value Ref Range Status   Specimen Description   Final    SITE NOT SPECIFIED BOTTLES DRAWN AEROBIC AND ANAEROBIC Performed at [facility], [address], [place], [place] [address]    Special Requests   Final    Blood Culture adequate volume Performed at [facility] [place] [place], [place] [address]    Culture  Setup Time   Final    GRAM POSITIVE COCCI AEB BOTTLES DRAWN AEROBIC ONLY Gram Stain Report Called to,Read Back By and Verified With: REECE THOMPSON,RN @2143  ON 12/04/22 BY SSLANE CRITICAL RESULT CALLED TO, READ BACK BY AND VERIFIED WITH: G ABBOTT,PHARMD@0200  12/05/22 [place] GRAM POSITIVE COCCI ANAEROBIC BOTTLE ONLY RESULT PREVIOUSLY CALLED Performed at [facility], [address] [place], [place] [address]    Culture METHICILLIN RESISTANT STAPHYLOCOCCUS AUREUS (A)  Final   Report Status 12/08/2022 FINAL  Final   Organism ID, Bacteria METHICILLIN RESISTANT  STAPHYLOCOCCUS AUREUS  Final      Susceptibility   Methicillin resistant staphylococcus aureus - MIC*    CIPROFLOXACIN >=8  RESISTANT Resistant     ERYTHROMYCIN >=8 RESISTANT Resistant     GENTAMICIN <=0.5 SENSITIVE Sensitive     OXACILLIN >=4 RESISTANT Resistant     TETRACYCLINE <=1 SENSITIVE Sensitive     VANCOMYCIN <=0.5 SENSITIVE Sensitive     TRIMETH/SULFA 20 SENSITIVE Sensitive     CLINDAMYCIN <=0.25 SENSITIVE Sensitive     RIFAMPIN <=0.5 SENSITIVE Sensitive     Inducible Clindamycin NEGATIVE Sensitive     * METHICILLIN RESISTANT STAPHYLOCOCCUS AUREUS  Blood Culture ID Panel (Reflexed)     Status: Abnormal   Collection Time: 12/03/22  4:00 AM  Result Value Ref Range Status   Enterococcus faecalis NOT DETECTED NOT DETECTED Final   Enterococcus Faecium NOT DETECTED NOT DETECTED Final   Listeria monocytogenes NOT DETECTED NOT DETECTED Final   Staphylococcus species DETECTED (A) NOT DETECTED Final    Comment: CRITICAL RESULT CALLED TO, READ BACK BY AND VERIFIED WITH: G ABBOTT,PHARMD@0200  12/05/22 [facility]    Staphylococcus aureus (BCID) DETECTED (A) NOT DETECTED Final    Comment: Methicillin (oxacillin)-resistant Staphylococcus aureus (MRSA). MRSA is predictably resistant to beta-lactam antibiotics (except ceftaroline). Preferred therapy is vancomycin unless clinically contraindicated. Patient requires contact precautions if  hospitalized. CRITICAL RESULT CALLED TO, READ BACK BY AND VERIFIED WITH: G ABBOTT,PHARMD@0200  12/05/22 [place]    Staphylococcus epidermidis NOT DETECTED NOT DETECTED Final   Staphylococcus lugdunensis NOT DETECTED NOT DETECTED Final   Streptococcus species NOT DETECTED NOT DETECTED Final   Streptococcus agalactiae NOT DETECTED NOT DETECTED Final   Streptococcus pneumoniae NOT DETECTED NOT DETECTED Final   Streptococcus pyogenes NOT DETECTED NOT DETECTED Final   A.calcoaceticus-baumannii NOT DETECTED NOT DETECTED Final   Bacteroides fragilis NOT DETECTED NOT DETECTED Final   Enterobacterales NOT DETECTED NOT DETECTED Final   Enterobacter cloacae complex NOT DETECTED NOT DETECTED Final    Escherichia coli NOT DETECTED NOT DETECTED Final   Klebsiella aerogenes NOT DETECTED NOT DETECTED Final   Klebsiella oxytoca NOT DETECTED NOT DETECTED Final   Klebsiella pneumoniae NOT DETECTED NOT DETECTED Final   Proteus species NOT DETECTED NOT DETECTED Final   Salmonella species NOT DETECTED NOT DETECTED Final   Serratia marcescens NOT DETECTED NOT DETECTED Final   Haemophilus influenzae NOT DETECTED NOT DETECTED Final   Neisseria meningitidis NOT DETECTED NOT DETECTED Final   Pseudomonas aeruginosa NOT DETECTED NOT DETECTED Final   Stenotrophomonas maltophilia NOT DETECTED NOT DETECTED Final   Candida albicans NOT DETECTED NOT DETECTED Final   Candida auris NOT DETECTED NOT DETECTED Final   Candida glabrata NOT DETECTED NOT DETECTED Final   Candida krusei NOT DETECTED NOT DETECTED Final   Candida parapsilosis NOT DETECTED NOT DETECTED Final   Candida tropicalis NOT DETECTED NOT DETECTED Final   Cryptococcus neoformans/gattii NOT DETECTED NOT DETECTED Final   Meth resistant mecA/C and MREJ DETECTED (A) NOT DETECTED Final    Comment: CRITICAL RESULT CALLED TO, READ BACK BY AND VERIFIED WITH: G ABBOTT,PHARMD@0200  12/05/22 [place] Performed at [facility], [address] [place], [place] [address]   Urine Culture     Status: Abnormal   Collection Time: 12/03/22  4:01 AM   Specimen: Urine, Catheterized  Result Value Ref Range Status   Specimen Description   Final    URINE, CATHETERIZED Performed at [facility], [address], [place],  [address]    Special Requests   Final  NONE Performed at [facility], [address], [place], [place] [address]    Culture (A)  Final    20,000 COLONIES/mL METHICILLIN RESISTANT STAPHYLOCOCCUS AUREUS   Report Status 12/05/2022 FINAL  Final   Organism ID, Bacteria METHICILLIN RESISTANT STAPHYLOCOCCUS AUREUS (A)  Final      Susceptibility   Methicillin resistant staphylococcus aureus - MIC*    CIPROFLOXACIN >=8 RESISTANT Resistant      GENTAMICIN <=0.5 SENSITIVE Sensitive     NITROFURANTOIN 32 SENSITIVE Sensitive     OXACILLIN >=4 RESISTANT Resistant     TETRACYCLINE <=1 SENSITIVE Sensitive     VANCOMYCIN <=0.5 SENSITIVE Sensitive     TRIMETH/SULFA <=10 SENSITIVE Sensitive     CLINDAMYCIN <=0.25 SENSITIVE Sensitive     RIFAMPIN <=0.5 SENSITIVE Sensitive     Inducible Clindamycin NEGATIVE Sensitive     * 20,000 COLONIES/mL METHICILLIN RESISTANT STAPHYLOCOCCUS AUREUS  Blood culture (routine x 2)     Status: Abnormal   Collection Time: 12/03/22  4:10 AM   Specimen: BLOOD  Result Value Ref Range Status   Specimen Description BLOOD SITE NOT SPECIFIED  Final   Special Requests   Final    BOTTLES DRAWN AEROBIC AND ANAEROBIC Blood Culture adequate volume   Culture  Setup Time   Final    CRITICAL RESULT CALLED TO, READ BACK BY AND VERIFIED WITH: IN BOTH AEROBIC AND ANAEROBIC BOTTLES GRAM POSITIVE COCCI Gram Stain Report Called to,Read Back By and Verified With: NAKIA MCGOWAN @ 1191 ON 12/05/22 [place] CRITICAL RESULT CALLED TO, READ BACK BY AND VERIFIED WITH: RN Minna Merritt[facility] [address] ON 12/05/22 @ 1950 BY DRT    Culture (A)  Final    STAPHYLOCOCCUS AUREUS SUSCEPTIBILITIES PERFORMED ON PREVIOUS CULTURE WITHIN THE LAST 5 DAYS. Performed at [facility], [place] [address] [place], [place] [address]    Report Status 12/08/2022 FINAL  Final  Blood Culture ID Panel (Reflexed)     Status: Abnormal   Collection Time: 12/03/22  4:10 AM  Result Value Ref Range Status   Enterococcus faecalis NOT DETECTED NOT DETECTED Final   Enterococcus Faecium NOT DETECTED NOT DETECTED Final   Listeria monocytogenes NOT DETECTED NOT DETECTED Final   Staphylococcus species DETECTED (A) NOT DETECTED Final    Comment: CRITICAL RESULT CALLED TO, READ BACK BY AND VERIFIED WITH: RN Minna Merritts [facility] ON 12/05/22 @ 1950 BY DRT    Staphylococcus aureus (BCID) DETECTED (A) NOT DETECTED Final    Comment: Methicillin (oxacillin)-resistant Staphylococcus  aureus (MRSA). MRSA is predictably resistant to beta-lactam antibiotics (except ceftaroline). Preferred therapy is vancomycin unless clinically contraindicated. Patient requires contact precautions if  hospitalized. CRITICAL RESULT CALLED TO, READ BACK BY AND VERIFIED WITH: RN Minna Merritts [facility] ON 12/05/22 @ 1950 BY DRT    Staphylococcus epidermidis NOT DETECTED NOT DETECTED Final   Staphylococcus lugdunensis NOT DETECTED NOT DETECTED Final   Streptococcus species NOT DETECTED NOT DETECTED Final   Streptococcus agalactiae NOT DETECTED NOT DETECTED Final   Streptococcus pneumoniae NOT DETECTED NOT DETECTED Final   Streptococcus pyogenes NOT DETECTED NOT DETECTED Final   A.calcoaceticus-baumannii NOT DETECTED NOT DETECTED Final   Bacteroides fragilis NOT DETECTED NOT DETECTED Final   Enterobacterales NOT DETECTED NOT DETECTED Final   Enterobacter cloacae complex NOT DETECTED NOT DETECTED Final   Escherichia coli NOT DETECTED NOT DETECTED Final   Klebsiella aerogenes NOT DETECTED NOT DETECTED Final   Klebsiella oxytoca NOT DETECTED NOT DETECTED Final   Klebsiella pneumoniae NOT DETECTED NOT DETECTED Final  Proteus species NOT DETECTED NOT DETECTED Final   Salmonella species NOT DETECTED NOT DETECTED Final   Serratia marcescens NOT DETECTED NOT DETECTED Final   Haemophilus influenzae NOT DETECTED NOT DETECTED Final   Neisseria meningitidis NOT DETECTED NOT DETECTED Final   Pseudomonas aeruginosa NOT DETECTED NOT DETECTED Final   Stenotrophomonas maltophilia NOT DETECTED NOT DETECTED Final   Candida albicans NOT DETECTED NOT DETECTED Final   Candida auris NOT DETECTED NOT DETECTED Final   Candida glabrata NOT DETECTED NOT DETECTED Final   Candida krusei NOT DETECTED NOT DETECTED Final   Candida parapsilosis NOT DETECTED NOT DETECTED Final   Candida tropicalis NOT DETECTED NOT DETECTED Final   Cryptococcus neoformans/gattii NOT DETECTED NOT DETECTED Final   Meth resistant mecA/C and MREJ  DETECTED (A) NOT DETECTED Final    Comment: CRITICAL RESULT CALLED TO, READ BACK BY AND VERIFIED WITH: RN Demetrio Lapping ON 12/05/22 @ 1950 BY DRT Performed at [facility] [address], [place], [place] [address]   Culture, blood (Routine X 2) w Reflex to ID Panel     Status: Abnormal   Collection Time: 12/05/22  9:12 AM   Specimen: BLOOD  Result Value Ref Range Status   Specimen Description BLOOD LEFT ANTECUBITAL  Final   Special Requests   Final    BOTTLES DRAWN AEROBIC AND ANAEROBIC Blood Culture adequate volume   Culture  Setup Time   Final    GRAM POSITIVE COCCI IN CLUSTERS IN BOTH AEROBIC AND ANAEROBIC BOTTLES CRITICAL RESULT CALLED TO, READ BACK BY AND VERIFIED WITH: [place], AT 1334 12/06/22 [place]    Culture (A)  Final    STAPHYLOCOCCUS SPECIES (COAGULASE NEGATIVE) STAPHYLOCOCCUS AUREUS SUSCEPTIBILITIES PERFORMED ON PREVIOUS CULTURE WITHIN THE LAST 5 DAYS. Performed at [facility], [place] [address] [place], [place] [address]    Report Status 12/08/2022 FINAL  Final   Organism ID, Bacteria STAPHYLOCOCCUS SPECIES (COAGULASE NEGATIVE)  Final      Susceptibility   Staphylococcus species (coagulase negative) - MIC*    CIPROFLOXACIN >=8 RESISTANT Resistant     ERYTHROMYCIN RESISTANT Resistant     GENTAMICIN <=0.5 SENSITIVE Sensitive     OXACILLIN >=4 RESISTANT Resistant     TETRACYCLINE <=1 SENSITIVE Sensitive     VANCOMYCIN <=0.5 SENSITIVE Sensitive     TRIMETH/SULFA <=10 SENSITIVE Sensitive     CLINDAMYCIN RESISTANT Resistant     RIFAMPIN <=0.5 SENSITIVE Sensitive     Inducible Clindamycin POSITIVE Resistant     * STAPHYLOCOCCUS SPECIES (COAGULASE NEGATIVE)  Culture, blood (Routine X 2) w Reflex to ID Panel     Status: Abnormal   Collection Time: 12/05/22  9:15 AM   Specimen: BLOOD LEFT WRIST  Result Value Ref Range Status   Specimen Description BLOOD LEFT WRIST  Final   Special Requests   Final    BOTTLES DRAWN AEROBIC AND ANAEROBIC  Blood Culture adequate volume   Culture  Setup Time   Final    GRAM POSITIVE COCCI IN CLUSTERS AEROBIC BOTTLE ONLY CRITICAL VALUE NOTED.  VALUE IS CONSISTENT WITH PREVIOUSLY REPORTED AND CALLED VALUE.    Culture (A)  Final    STAPHYLOCOCCUS SPECIES (COAGULASE NEGATIVE) SUSCEPTIBILITIES PERFORMED ON PREVIOUS CULTURE WITHIN THE LAST 5 DAYS. Performed at [facility], [place] [address], [place], [place] [address]    Report Status 12/08/2022 FINAL  Final  Aerobic/Anaerobic Culture w Gram Stain (surgical/deep wound)     Status: None   Collection Time:  12/05/22  7:52 PM   Specimen: Abscess  Result Value Ref Range Status   Specimen Description ABSCESS  Final   Special Requests LUMBAR 4 EPIDURAL SPACE  Final   Gram Stain   Final    RARE WBC PRESENT, PREDOMINANTLY PMN NO ORGANISMS SEEN    Culture   Final    RARE METHICILLIN RESISTANT STAPHYLOCOCCUS AUREUS NO ANAEROBES ISOLATED Performed at [facility], [place] [address] [place], [place] [address]    Report Status 12/10/2022 FINAL  Final   Organism ID, Bacteria METHICILLIN RESISTANT STAPHYLOCOCCUS AUREUS  Final      Susceptibility   Methicillin resistant staphylococcus aureus - MIC*    CIPROFLOXACIN >=8 RESISTANT Resistant     ERYTHROMYCIN >=8 RESISTANT Resistant     GENTAMICIN <=0.5 SENSITIVE Sensitive     OXACILLIN >=4 RESISTANT Resistant     TETRACYCLINE <=1 SENSITIVE Sensitive     VANCOMYCIN <=0.5 SENSITIVE Sensitive     TRIMETH/SULFA <=10 SENSITIVE Sensitive     CLINDAMYCIN <=0.25 SENSITIVE Sensitive     RIFAMPIN <=0.5 SENSITIVE Sensitive     Inducible Clindamycin NEGATIVE Sensitive     * RARE METHICILLIN RESISTANT STAPHYLOCOCCUS AUREUS  Culture, blood (Routine X 2) w Reflex to ID Panel     Status: None   Collection Time: 12/06/22  3:09 PM   Specimen: BLOOD LEFT HAND  Result Value Ref Range Status   Specimen Description BLOOD LEFT HAND  Final   Special Requests   Final    BOTTLES DRAWN AEROBIC ONLY Blood Culture  adequate volume   Culture   Final    NO GROWTH 5 DAYS Performed at [facility], [address], [place], [place] [address]    Report Status 12/11/2022 FINAL  Final  Culture, blood (Routine X 2) w Reflex to ID Panel     Status: None   Collection Time: 12/06/22  3:10 PM   Specimen: BLOOD RIGHT HAND  Result Value Ref Range Status   Specimen Description BLOOD RIGHT HAND  Final   Special Requests   Final    BOTTLES DRAWN AEROBIC AND ANAEROBIC Blood Culture adequate volume   Culture   Final    NO GROWTH 5 DAYS Performed at [facility], [place] [address], [place], [place] [address]    Report Status 12/11/2022 FINAL  Final    Studies/Results: MR Lumbar Spine W Wo Contrast  Result Date: 12/31/2022 CLINICAL DATA:  Low back pain. Bacteremia. 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 EXAM: MRI LUMBAR SPINE WITHOUT AND WITH CONTRAST TECHNIQUE: Multiplanar and multiecho pulse sequences of the lumbar spine were obtained without and with intravenous contrast. CONTRAST:  47mL GADAVIST GADOBUTROL 1 MMOL/ML IV SOLN COMPARISON:  MRI L Spine 12/05/22 FINDINGS: Segmentation:  Standard. Alignment:  Physiologic. Vertebrae: Interval postsurgical changes from L4 laminectomy. There is persistent abnormal T2/stir hyperintense signal within the L3-L4 disc space and abnormal contrast enhancement in the L3-L4 vertebral bodies as well as the prevertebral space at these levels. There is now abnormal T2/stir hyperintense signal in the interspinous space at the L3-L4 levels as well as the subcutaneous soft tissues, which is nonspecific, and may be due to recent surgical intervention, but superimposed infectious not excluded. Conus medullaris and cauda equina: Conus extends to the L1 level. Conus and cauda equina appear normal. Paraspinal and other soft tissues: There are multiple small fluid collections in the bilateral psoas muscle bodies (series 9, image 28, 34, 26).  There  is also a 3.0 x 1.0 cm soft tissue abscess in the prevertebral space (series 9, image 20). That likely slightly decreased in size compared to prior exam. Heterogeneous appearance of the right kidney with interval decrease in size of previously seen T2 hyperintense structures, favored to represent evolving and resolving infection Disc levels: Compared to prior exam there is interval decrease in size of the epidural fluid collection at the L4 level, now measuring up to 2.4 x 0.9 cm, previously 2.7 x 0.9 cm. The degree of spinal canal stenosis at the L3-L4 level is also improved, now moderate, previously severe Otherwise there is no significant change in the degree of degenerative change with persistent severe narrowing of the right lateral recess and bilateral neuroforamina at the L5-S1 level IMPRESSION: 1. Interval decrease in size of the epidural abscess at the L4 level, now measuring up to 2.4 x 0.9 cm, previously 2.7 x 0.9 cm. The degree of spinal canal stenosis at the L3-L4 level is also improved, now moderate, previously severe. 2. 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 3. Heterogeneous appearance of the right kidney with interval decrease in size of previously seen T2 hyperintense structures, favored to represent evolving and resolving infection. Recommend correlation with urinalysis and renal ultrasound for further evaluation. Electronically Signed   By: Marin Roberts M.D.   On: 12/31/2022 15:56      Assessment/Plan:  INTERVAL HISTORY: repeat MRI complete   Principal Problem:   MRSA bacteremia Active Problems:   AKI (acute kidney injury) ([place])   Diskitis   Bipolar 1 disorder (HCC)   Peripheral neuropathy   Polysubstance abuse (HCC)   Metabolic acidosis   Chronic viral hepatitis B without  delta-agent (HCC)   Cutaneous abscess of right foot   H/O laminectomy   Acute urinary retention   Acute renal failure (HCC)   Hyperkalemia   Acute anemia   Opioid dependence in remission ([place])    Hunter Reyes is a 47 y.o. 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  #1 Verebral osteomyelitis, diskitis, epidural abscess:  Repeat MRI shows improvement but not resolution  Would continue current course of daptomycin and repeat MRI prior to his stop date  #2 TMA site: healing with vacuum dressing  #3 MRSA bacteremia with above complications  #4 IVDU: does not have Hep C or B, would benefit from HAV and HBV vaccination   I spent 52 minutes with the patient including than 50% of the time in face to face counseling of the patient re his metastatic MRSA infection personally reviewing MRI of the L spine along with review of medical records in preparation for the visit and during the visit and in coordination of his care.  I will remove him from rounding list but please repeat MRI prior to his IV antibiotic stop date and call us back to see him prior to DC  After finishing his IV antibiotics I would continue him further with po doxycycline 100mg  po BID and followup in our clinic       LOS: 29 days   Alcide Evener 01/01/2023, 6:22 PM

## 2023-01-01 NOTE — Progress Notes (Signed)
Occupational Therapy Treatment Patient Details Name: Hunter Reyes MRN: [id number] DOB: May 05, 1976 Today's Date: 01/01/2023   History of present illness 47 y.o. male admitted 12/10 with AMS, urinary retention and low back pain. Underwent L4 lumbar laminectomy for epidural abscess 12/13. s/p R foot transmet amp 12/17. Pt initiated HD on 12/26. PMH:  bipolar 1 disorder, borderline personality disorder, COPD, crack cocaine abuse, depression, peripheral neuropathy, history of right foot osteomyelitis, panic attack and history of seizures   OT comments  Patient up in recliner upon entry and asked to perform grooming. Patient provided with bariatric drop arm commode and performed transfer from recliner with min assist. Patient required increased time on commode and unable to perform toilet hygiene. Patient transferred back to recliner and asked to return to bed. Patient has made gains with toilet transfers but continue to require verbal cues to maintain WB precautions. Acute OT to continue to follow to further address ADL transfers and self care.    Recommendations for follow up therapy are one component of a multi-disciplinary discharge planning process, led by the attending physician.  Recommendations may be updated based on patient status, additional functional criteria and insurance authorization.    Follow Up Recommendations  Skilled nursing-short term rehab (<3 hours/day)     Assistance Recommended at Discharge Intermittent Supervision/Assistance  Patient can return home with the following  A lot of help with bathing/dressing/bathroom;Assistance with cooking/housework;Help with stairs or ramp for entrance;Assist for transportation;A lot of help with walking and/or transfers   Equipment Recommendations  Wheelchair (measurements OT);Wheelchair cushion (measurements OT);Other (comment) (bariatric drop arm commode)    Recommendations for Other Services      Precautions / Restrictions  Precautions Precautions: Fall;Back Required Braces or Orthoses: Other Brace Other Brace: post op shoe- not present during session Restrictions Weight Bearing Restrictions: Yes RLE Weight Bearing: Non weight bearing       Mobility Bed Mobility Overal bed mobility: Needs Assistance Bed Mobility: Sit to Sidelying         Sit to sidelying: Supervision General bed mobility comments: returned to bed with supervision    Transfers Overall transfer level: Needs assistance Equipment used: None Transfers: Bed to chair/wheelchair/BSC            Lateral/Scoot Transfers: Min assist General transfer comment: lateral scoot transfer from recliner<>BSC and recliner to EOB     Balance Overall balance assessment: Needs assistance Sitting-balance support: No upper extremity supported, Feet supported Sitting balance-[name] Scale: Good                                     ADL either performed or assessed with clinical judgement   ADL Overall ADL's : Needs assistance/impaired     Grooming: Wash/dry hands;Wash/dry face;Oral care;Set up;Sitting Grooming Details (indicate cue type and reason): in recliner                 Toilet Transfer: Minimal assistance;Requires drop arm;BSC/3in1 Toilet Transfer Details (indicate cue type and reason): lateral scoot transfer to drop arm BSC with min assist Toileting- Clothing Manipulation and Hygiene: Maximal assistance;Sitting/lateral lean Toileting - Clothing Manipulation Details (indicate cue type and reason): patient performed lateral leaning with max assist to perform toilet hygiene       General ADL Comments: patient pleased with drop arm commode and able to perform transfer but difficulty maintaining WB precautions    Extremity/Trunk Assessment  Vision       Perception     Praxis      Cognition Arousal/Alertness: Awake/alert Behavior During Therapy: Flat affect Overall Cognitive Status: No  family/caregiver present to determine baseline cognitive functioning Area of Impairment: Attention, Problem solving, Memory                   Current Attention Level: Sustained Memory: Decreased recall of precautions   Safety/Judgement: Decreased awareness of safety Awareness: Emergent Problem Solving: Slow processing General Comments: required frequent cues for NWB with poor carryover. Patient believed it was Saturday        Exercises      Shoulder Instructions       General Comments Pt sat EOB x 5-6", but agreeable to transfer into recliner.  Pt returned to sidelying> supine and sidelying to opposite side of bed, then transferred to sit.    Pertinent Vitals/ Pain       Pain Assessment Pain Assessment: Faces Faces Pain Scale: Hurts even more Pain Location: back Pain Descriptors / Indicators: Grimacing, Guarding Pain Intervention(s): Limited activity within patient's tolerance, Monitored during session, Repositioned  Home Living                                          Prior Functioning/Environment              Frequency  Min 2X/week        Progress Toward Goals  OT Goals(current goals can now be found in the care plan section)  Progress towards OT goals: Progressing toward goals  Acute Rehab OT Goals Patient Stated Goal: get better OT Goal Formulation: With patient Time For Goal Achievement: 01/09/23 Potential to Achieve Goals: Fair ADL Goals Pt Will Perform Grooming: sitting;with set-up Pt Will Perform Lower Body Dressing: sitting/lateral leans;with supervision Pt Will Transfer to Toilet: with supervision;with transfer board;bedside commode Pt Will Perform Tub/Shower Transfer: Tub transfer;Stand pivot transfer;with min assist;tub bench Additional ADL Goal #1: Patient will maintain NWB on RLE and spinal precautions throughout ADLs.  Plan Discharge plan remains appropriate    Co-evaluation                 AM-PAC OT "6  Clicks" Daily Activity     Outcome Measure   Help from another person eating meals?: None Help from another person taking care of personal grooming?: A Little Help from another person toileting, which includes using toliet, bedpan, or urinal?: Total Help from another person bathing (including washing, rinsing, drying)?: A Lot Help from another person to put on and taking off regular upper body clothing?: A Little Help from another person to put on and taking off regular lower body clothing?: A Lot 6 Click Score: 15    End of Session Equipment Utilized During Treatment: Other (comment) (drop arm BSC)  OT Visit Diagnosis: Unsteadiness on feet (R26.81);Muscle weakness (generalized) (M62.81);Pain;Other symptoms and signs involving cognitive function;Other abnormalities of gait and mobility (R26.89)   Activity Tolerance Patient tolerated treatment well   Patient Left in bed;with call bell/phone within reach;with bed alarm set   Nurse Communication Mobility status        Time: 4765-4650 OT Time Calculation (min): 53 min  Charges: OT General Charges $OT Visit: 1 Visit OT Treatments $Self Care/Home Management : 53-67 mins  Lodema Hong, [place]  Office [place] 01/01/2023, 1:11  PM

## 2023-01-01 NOTE — Progress Notes (Signed)
Physical Therapy Treatment Patient Details Name: Hunter Reyes MRN: [id number] DOB: 04-Jul-1976 Today's Date: 01/01/2023   History of Present Illness 47 y.o. male admitted 12/10 with AMS, urinary retention and low back pain. Underwent L4 lumbar laminectomy for epidural abscess 12/13. s/p R foot transmet amp 12/17. Pt initiated HD on 12/26. PMH:  bipolar 1 disorder, borderline personality disorder, COPD, crack cocaine abuse, depression, peripheral neuropathy, history of right foot osteomyelitis, panic attack and history of seizures    PT Comments    Pt presents semi-reclined in bed and agreeable to therapy.  Pt performs log roll transfer to B sides of bed w/ supervision and verbal cues for ease of independence.  Pt sat on R side of bed x 5-6' stating his doctor wants him to be up.  Pt then agreeable to transfer to recliner on opposite side of bed, so agreed to return to supine and perform scoot transfer.  Pt does require verbal cues and manual assist to maintain NWB RLE.  Pt performed sequential scoot bed to recliner w/ min A for RLE and then encouragement as pt tends to freeze up.  Pt remains sitting in recliner w/ LE s elevated and seat alarm on.  All needs in reach.  Continue acute PT, pt told will be in hospital longer for IV, understanding of need to continue to increase strength to return home.    Recommendations for follow up therapy are one component of a multi-disciplinary discharge planning process, led by the attending physician.  Recommendations may be updated based on patient status, additional functional criteria and insurance authorization.  Follow Up Recommendations  Skilled nursing-short term rehab (<3 hours/day)     Assistance Recommended at Discharge Frequent or constant Supervision/Assistance  Patient can return home with the following Assistance with cooking/housework;Assist for transportation;Help with stairs or ramp for entrance;A lot of help with walking and/or  transfers;A little help with bathing/dressing/bathroom   Equipment Recommendations       Recommendations for Other Services       Precautions / Restrictions Precautions Precautions: Fall;Back Required Braces or Orthoses: Other Brace Other Brace: post op shoe- not present during session Restrictions Weight Bearing Restrictions: Yes RLE Weight Bearing: Non weight bearing     Mobility  Bed Mobility Overal bed mobility: Needs Assistance Bed Mobility: Sidelying to Sit, Rolling, Sit to Sidelying Rolling: Supervision Sidelying to sit: Supervision     Sit to sidelying: Supervision General bed mobility comments: cues for log roll technique, especially for hand placement to avoid twisting.    Transfers Overall transfer level: Needs assistance   Transfers: Bed to chair/wheelchair/BSC            Lateral/Scoot Transfers: Min guard, Min assist General transfer comment: sequential scoot bed > recliner w/ drop arm, Pillow placed in space between for pt comfort. pt requires encouragement to start/continue.      Modified Rankin (Stroke Patients Only)       Balance   Sitting-balance support: No upper extremity supported, Feet supported Sitting balance-[name] Scale: Good                                      Cognition Arousal/Alertness: Awake/alert                               Safety/Judgement: Decreased awareness of safety     General Comments: keeps  putting weight on R foot despite cues, R foot held off ground for transfers.        Exercises      General Comments General comments (skin integrity, edema, etc.): Pt sat EOB x 5-6", but agreeable to transfer into recliner.  Pt returned to sidelying> supine and sidelying to opposite side of bed, then transferred to sit.      Pertinent Vitals/Pain Pain Assessment Pain Score: 7  Pain Location: back Pain Descriptors / Indicators: Grimacing, Guarding    Home Living                           Prior Function            PT Goals (current goals can now be found in the care plan section) Acute Rehab PT Goals PT Goal Formulation: With patient Time For Goal Achievement: 01/08/23 Potential to Achieve Goals: Fair Progress towards PT goals: Progressing toward goals    Frequency    Min 2X/week      PT Plan Current plan remains appropriate    Co-evaluation              AM-PAC PT "6 Clicks" Mobility   Outcome Measure  Help needed turning from your back to your side while in a flat bed without using bedrails?: None Help needed moving from lying on your back to sitting on the side of a flat bed without using bedrails?: None Help needed moving to and from a bed to a chair (including a wheelchair)?: None Help needed standing up from a chair using your arms (e.g., wheelchair or bedside chair)?: Total Help needed to walk in hospital room?: Total Help needed climbing 3-5 steps with a railing? : Total 6 Click Score: 15    End of Session   Activity Tolerance: Patient limited by pain Patient left: in chair;with call bell/phone within reach;with chair alarm set Nurse Communication: Mobility status PT Visit Diagnosis: Muscle weakness (generalized) (M62.81);Pain;Other abnormalities of gait and mobility (R26.89)     Time: 4917-9150 PT Time Calculation (min) (ACUTE ONLY): 33 min  Charges:  $Therapeutic Activity: 23-37 mins                     {Dashley Monts PSteffanie Dunn, PT    Ander Purpura Anahit Klumb 01/01/2023, 10:30 AM

## 2023-01-02 DIAGNOSIS — B9562 Methicillin resistant Staphylococcus aureus infection as the cause of diseases classified elsewhere: Secondary | ICD-10-CM | POA: Diagnosis not present

## 2023-01-02 DIAGNOSIS — R7881 Bacteremia: Secondary | ICD-10-CM | POA: Diagnosis not present

## 2023-01-02 LAB — BASIC METABOLIC PANEL
Anion gap: 13 (ref 5–15)
BUN: 43 mg/dL — ABNORMAL HIGH (ref 6–20)
CO2: 21 mmol/L — ABNORMAL LOW (ref 22–32)
Calcium: 8.3 mg/dL — ABNORMAL LOW (ref 8.9–10.3)
Chloride: 104 mmol/L (ref 98–111)
Creatinine, Ser: 5.18 mg/dL — ABNORMAL HIGH (ref 0.61–1.24)
GFR, Estimated: 13 mL/min — ABNORMAL LOW (ref >60)
Glucose, Bld: 83 mg/dL (ref 70–99)
Potassium: 3.6 mmol/L (ref 3.5–5.1)
Sodium: 138 mmol/L (ref 135–145)

## 2023-01-02 LAB — CBC WITH DIFFERENTIAL/PLATELET
Abs Immature Granulocytes: 0.11 10*3/uL — ABNORMAL HIGH (ref 0.00–0.07)
Basophils Absolute: 0.1 10*3/uL (ref 0.0–0.1)
Basophils Relative: 1 %
Eosinophils Absolute: 0.7 10*3/uL — ABNORMAL HIGH (ref 0.0–0.5)
Eosinophils Relative: 5 %
HCT: 24.3 % — ABNORMAL LOW (ref 39.0–52.0)
Hemoglobin: 7.8 g/dL — ABNORMAL LOW (ref 13.0–17.0)
Immature Granulocytes: 1 %
Lymphocytes Relative: 18 %
Lymphs Abs: 2.4 10*3/uL (ref 0.7–4.0)
MCH: 28.6 pg (ref 26.0–34.0)
MCHC: 32.1 g/dL (ref 30.0–36.0)
MCV: 89 fL (ref 80.0–100.0)
Monocytes Absolute: 1.7 10*3/uL — ABNORMAL HIGH (ref 0.1–1.0)
Monocytes Relative: 13 %
Neutro Abs: 8.3 10*3/uL — ABNORMAL HIGH (ref 1.7–7.7)
Neutrophils Relative %: 62 %
Platelets: 278 10*3/uL (ref 150–400)
RBC: 2.73 MIL/uL — ABNORMAL LOW (ref 4.22–5.81)
RDW: 14.4 % (ref 11.5–15.5)
WBC: 13.2 10*3/uL — ABNORMAL HIGH (ref 4.0–10.5)
nRBC: 0 % (ref 0.0–0.2)

## 2023-01-02 LAB — CK: Total CK: 933 U/L — ABNORMAL HIGH (ref 49–397)

## 2023-01-02 MED ORDER — LACTATED RINGERS IV SOLN: INTRAVENOUS | Status: DC

## 2023-01-02 MED ORDER — POTASSIUM CHLORIDE CRYS ER 20 MEQ PO TBCR
20.0000 meq | EXTENDED_RELEASE_TABLET | Freq: Three times a day (TID) | ORAL | Status: AC
  Administered 2023-01-02 – 2023-01-03 (×3): 20 meq via ORAL
  Filled 2023-01-02 (×3): qty 1

## 2023-01-02 NOTE — Progress Notes (Signed)
PROGRESS NOTE        PATIENT DETAILS Name: Hunter Reyes Age: 47 y.o. Sex: male Date of Birth: 04-26-1976 Admit Date: 12/02/2022 Admitting Physician [name], MD [id number], Edwinna Areola, MD  Brief Summary: Patient is a 47 y.o.  male with history of bipolar disorder, crack cocaine use, peripheral neuropathy-who was transferred from [facility] for AKI/urinary retention-upon further evaluation-he was found to have MRSA bacteremia with right foot abscess requiring TMA, lumbar discitis/abscess-requiring emergent laminectomy.  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  Significant events: 12/10>> admit to [facility]-AKI-urinary retention.  Plans to transfer to [facility]. 12/13>> laminectomy/decompression of ventral lumbar epidural abscess 12/17>> transmetatarsal amputation of right foot 12/21>>voiding trial-foley removed 12/22>>worsening renal function-urinary retention-foley replaced 12/25>>Urine protein creatinine ratio of 7.68-w hematuria-suggestion of new AKI is 2/2 infection GN 12/26>>Temp HD cath placed by IR-HD started 1/2 >> Temp HD cath removed  Significant studies: 12/11>> CT renal stone study: Possible discitis L2-L3/L3-L4, moderate bilateral hydronephrosis/hydroureter 12/13>> MRI L-spine: Discitis/osteomyelitis at L3-L4 with 9 mm thick ventral canal phlegmon/abscess which extends from L3 to L4-L5.  Multiple paraspinal abscesses within the psoas musculature. 12/13>> echo: EF 60-65%, no evidence of valvular vegetations. 12/14>> MRI left foot: No osteomyelitis/abscess 12/15>> MRI right foot: Abscess with osteomyelitis of second/third metatarsals. 12/25>>Urine protein creatinine ratio of 7.68 12/25>> ASO titer: Negative 12/25>> ANA: Negative 12/25>> ANCA titers: Negative 12/25>> GBM antibody: Negative 12/25>> C3/C4: Normal limits. 12/25>> HBV/HCV serology:  1/8 >>MRI - 1. Interval decrease in size of  the epidural abscess at the L4 level, now measuring up to 2.4 x 0.9 cm, previously 2.7 x 0.9 cm. The degree of spinal canal stenosis at the L3-L4 level is also improved, now moderate, previously severe. 2. 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 3. Heterogeneous appearance of the right kidney with interval decrease in size of previously seen T2 hyperintense structures, favored to represent evolving and resolving infection 1/10 >> Kidney function continues to decline, UOP appropriate  Significant microbiology data: 12/10>> COVID/influenza PCR: Negative 12/11>> blood culture: MRSA 12/13>> blood culture: MRSA 12/13>> epidural abscess culture: MRSA 12/14>> blood culture: No growth  Procedures: 12/13>> right L4 laminectomy-sublaminar decompression of ventral epidural abscess. 12/17>> transmetatarsal amputation of right foot 12/26>>Right IJ Trialysis CVC placed by IR  Consults: ID Neurosurgery IR Renal  Subjective: No acute issues or events overnight, denies nausea vomiting headache fevers chills or chest pain  Objective:  Vitals: Blood pressure 109/66, pulse 76, temperature 98 F (36.7 C), temperature source Oral, resp. rate 20, height 6\' 2"  (1.88 m), weight 134.1 kg, SpO2 96 %.   Exam:  Awake Alert, No new F.N deficits, Normal affect .AT,PERRAL Supple Neck, No JVD,   Symmetrical Chest wall movement, Good air movement bilaterally, CTAB RRR,No Gallops, Rubs or new Murmurs,  +ve B.Sounds, Abd Soft, No tenderness,   Right transmetatarsal partial amputation with wound VAC, 1+ edema, Foley catheter in place,    Assessment/Plan:  MRSA bacteremia, L3-L4 discitis with epidural abscess-s/p laminectomy/decompression on 12/05/22 - Right foot osteomyelitis/abscess-s/p transmetatarsal amputation on  12/09/22  -   Overall improved-repeat cultures negative so far, Orthopedics/neurosurgery following-able to lift both lower extremities off the bed. Continue to work with  PT/OT. - ID recommending 8 weeks of IV Daptomycin -tentative end date 02/03/2023 -Repeat MRI L spine on 01/02/23- repeat MRI was done on 12/31/2022, results suggest improving infection.  ID recommending repeat MRI prior to completion of antibiotics -Given history of drug use-unfortunately will remain inpatient to complete IV antibiotic duration.  Not a candidate for home therapies.  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  -Urine output continues to be appropriate however somewhat cloudy and red today -Creatinine continues to trend upwards despite urine output -Completed intermittent hemodialysis, temporary catheter removed 12/25/22 -Nephrology to reevaluate, continue IV fluids and liberalize free water intake given renal function   Hypokalemia, hypomagnesemia: Replete/recheck as appropriate.  Normocytic anemia - Received 1 unit of packed RBC on 12/17/2022,  -Slowly downtrending in the setting of iron deficiency versus chronic anemia given above  -Continue B12 replacement, iron replacement, folic acid replacement -Change labs to every 48 hours to decrease iatrogenic anemia  Peripheral neuropathy - No longer on Neurontin given worsening AKI. Bipolar 1 disorder - Stable, Continue zoloft Polysubstance abuse - History of cocaine/IV use, continue home Suboxone dosing Dental caries -Per imaging, recommend outpatient dentistry follow-up Morbid Obesity: Estimated body mass index is 37.96 kg/m as calculated from the following:   Height as of this encounter: 6\' 2"  (1.88 m).   Weight as of this encounter: 134.1 kg.   Code status:   Code Status: Full Code   DVT  Prophylaxis: heparin injection 5,000 Units Start: 12/16/22 1000 SCD's Start: 12/09/22 1137   Family Communication: None at bedside  Disposition Plan: Status is: Inpatient Remains inpatient appropriate because: Severity of illness and ongoing need for IV antibiotics through 02/03/2023   Planned Discharge Destination:Home ultimately-but will need to remain inpatient to complete IV daptomycin-end date 02/03/23   Diet: Diet Order             Diet renal with fluid restriction Fluid restriction: 1200 mL Fluid; Room service appropriate? Yes; Fluid consistency: Thin  Diet effective now                    MEDICATIONS: Scheduled Meds:  buprenorphine-naloxone  1 tablet Sublingual BID   Chlorhexidine Gluconate Cloth  6 each Topical Q0600   vitamin B-12  1,000 mcg Oral Daily   docusate sodium  200 mg Oral BID   ferrous sulfate  325 mg Oral BID WC   folic acid  1 mg Oral Daily   heparin injection (subcutaneous)  5,000 Units Subcutaneous Q8H   nutrition supplement (JUVEN)  1 packet Oral BID BM   pantoprazole  40 mg Oral Daily   polyethylene glycol  17 g Oral Daily   sertraline  25 mg Oral Daily   tamsulosin  0.4 mg Oral QPC supper   thiamine  500 mg Oral Daily   Continuous Infusions:  DAPTOmycin (CUBICIN) 850 mg in sodium chloride 0.9 % IVPB 850 mg (01/02/23 0041)   PRN Meds:.alum & mag hydroxide-simeth, bisacodyl, diphenhydrAMINE, guaiFENesin-dextromethorphan, ondansetron **OR** ondansetron (ZOFRAN) IV, oxyCODONE, phenol, tiZANidine, traZODone   I have personally reviewed following labs and imaging studies  LABORATORY DATA:  Recent Labs  Lab 12/29/22 1047 12/30/22 0227 12/31/22 0634 01/01/23 0316 01/02/23 0223  WBC 15.0* 14.9* 14.8* 15.2* 13.2*  HGB 8.3* 8.2* 8.3* 8.1* 7.8*  HCT 25.3* 24.9* 26.2* 25.7* 24.3*  PLT 345 321 310 286 278  MCV 87.8 87.7 89.1 89.5 89.0  MCH 28.8 28.9 28.2 28.2 28.6  MCHC 32.8 32.9 31.7 31.5 32.1  RDW 14.5 14.5 14.7 14.6 14.4  LYMPHSABS 2.0  2.0 1.8 2.0 2.4  MONOABS 1.8* 1.6* 1.9* 2.2* 1.7*  EOSABS 0.5 0.5 0.5 0.6* 0.7*  BASOSABS 0.1 0.1 0.1 0.1 0.1     Recent Labs  Lab 12/27/22 0234 12/28/22 0432 12/29/22 1047 12/30/22 0227 12/31/22 0634 01/01/23 0316 01/02/23 0223  NA 140   < > 138 139 140 139 138  K 3.7   < > 3.4* 3.0* 3.2* 3.3* 3.6  CL 102   < > 101 104 107 102 104  CO2 27   < > 24 23 23 22  21*  ANIONGAP 11   < > 13 12 10 15 13   GLUCOSE 93   < > 97 102* 90 89 83  BUN 44*   < > 41* 40* 40* 43* 43*  CREATININE 4.00*   < > 4.36* 4.36* 4.67* 5.00* 5.18*  BNP 24.8  --   --   --   --   --   --   MG 2.4  --   --   --   --   --   --   CALCIUM 8.2*   < > 8.0* 8.2* 8.2* 8.2* 8.3*   < > = values in this interval not displayed.     RADIOLOGY STUDIES/RESULTS: MR Lumbar Spine W Wo Contrast  Result Date: 12/31/2022 CLINICAL DATA:  Low back pain. Bacteremia. 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 EXAM: MRI LUMBAR SPINE WITHOUT AND WITH CONTRAST TECHNIQUE: Multiplanar and multiecho pulse sequences of the lumbar spine were obtained without and with intravenous contrast. CONTRAST:  96mL GADAVIST GADOBUTROL 1 MMOL/ML IV SOLN COMPARISON:  MRI L Spine 12/05/22 FINDINGS: Segmentation:  Standard. Alignment:  Physiologic. Vertebrae: Interval postsurgical changes from L4 laminectomy. There is persistent abnormal T2/stir hyperintense signal within the L3-L4 disc space and abnormal contrast enhancement in the L3-L4 vertebral bodies as well as the prevertebral space at these levels. There is now abnormal T2/stir hyperintense signal in the interspinous space at the L3-L4 levels as well as the subcutaneous soft tissues, which is nonspecific, and may be due to recent surgical intervention, but superimposed infectious not excluded. Conus medullaris and cauda equina: Conus extends to the L1 level. Conus and cauda equina appear normal. Paraspinal and other soft tissues: There are multiple small  fluid collections in the bilateral psoas muscle bodies (series 9, image 28, 34, 26). There is also a 3.0 x 1.0 cm soft tissue abscess in the prevertebral space (series 9, image 20). That likely slightly decreased in size compared to prior exam. Heterogeneous appearance of the right kidney with interval decrease in size of previously seen T2 hyperintense structures, favored to represent evolving and resolving infection Disc levels: Compared to prior exam there is interval decrease in size of the epidural fluid collection at the L4 level, now measuring up to 2.4 x 0.9 cm, previously 2.7 x 0.9 cm. The degree of spinal canal stenosis at the L3-L4 level is also improved, now moderate, previously severe Otherwise there is no significant change in the degree of degenerative change with persistent severe narrowing of the right lateral recess and bilateral neuroforamina at the L5-S1 level IMPRESSION: 1. Interval decrease in size of the epidural abscess at the L4 level, now measuring up to 2.4 x 0.9 cm, previously 2.7 x 0.9 cm. The degree of spinal canal stenosis at the L3-L4 level is  also improved, now moderate, previously severe. 2. 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 3. Heterogeneous appearance of the right kidney with interval decrease in size of previously seen T2 hyperintense structures, favored to represent evolving and resolving infection. Recommend correlation with urinalysis and renal ultrasound for further evaluation. Electronically Signed   By: Marin Roberts M.D.   On: 12/31/2022 15:56     LOS: 30 days   [place] on 01/02/2023 at 7:51 AM   -  To page go to [email or web address]

## 2023-01-02 NOTE — Progress Notes (Signed)
Patient ID: Hunter Reyes, male   DOB: Feb 19, 1976, 47 y.o.   MRN: [id number] [facility] Progress Note  Reconsulted to see this 47 year old man with past medical history significant for chronic obstructive lung disease, bipolar disorder, depression and polysubstance abuse.  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  He was transiently on hemodialysis between 12/26 and 12/30 for severe azotemia (BUN peak 101, creatinine 7.2) and hyperkalemia with his hemodialysis catheter discontinued after improving urine output.  Unfortunately over the past week, he has had a progressive rise of creatinine from 3.6 now up to 5.2 surprisingly with relatively stable BUN of 40-43 and without significant acute electrolyte abnormalities.  He is on intravenous daptomycin for his disseminated MRSA infection.  He reports that his appetite has been fair at best and denies any chest pain or shortness of breath.  Assessment/ Plan:   1. Acute kidney Injury: Initial injury suspected to be from obstructive uropathy for which he is status post Foley catheter placement.  Clinically suspected to also had component of infection associated GN however with normal complement levels.  He had mildly elevated CPK level which raise against daptomycin associated rhabdomyolysis and subsequent kidney injury however, I would favor volume expansion with intravenous fluids given his net fluid balance to help with myoglobin dumping.  He surprisingly has had stable BUN and does not have any indications for dialysis at this time.  Avoiding renal biopsy in the setting of disseminated MRSA infection and the risk of seeding. 2.  Hypokalemia/hypomagnesemia: Secondary to limited oral intake, will order oral replacement. 3.   Disseminated MRSA infection: Status post right transmetatarsal amputation with wound VAC in place.  Ongoing antimicrobial therapy with daptomycin. 4.  Anemia: Secondary to acute/critical illness and compounded by recent right TMA.  Monitor for PRBC needs.  No overt loss.  Subjective:   Complains of intermittent back pain but reports that right TMA site with wound VAC is comfortable.  Denies any abnormal limb jerking movements, dysgeusia, nausea or vomiting.   Objective:   BP 112/61 (BP Location: Left Arm)   Pulse 99   Temp 98.1 F (36.7 C) (Oral)   Resp 17   Ht 6\' 2"  (1.88 m)   Wt 134.1 kg   SpO2 94%   BMI 37.96 kg/m   Intake/Output Summary (Last 24 hours) at 01/02/2023 1122 Last data filed at 01/02/2023 0915 Gross per 24 hour  Intake 1741.96 ml  Output 2300 ml  Net -558.04 ml   Weight change:   Physical Exam: Gen: Appears comfortable sitting in bed, watching television CVS: Pulse regular rhythm, normal rate, S1 and S2 normal Resp: Clear to auscultation bilaterally, no wheezing rales or rhonchi Abd: Soft, flat, nontender, bowel sounds normal Ext: No lower extremity edema, right TMA with wound VAC  Imaging: MR Lumbar Spine W Wo Contrast  Result Date: 12/31/2022 CLINICAL DATA:  Low back pain. Bacteremia. 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 EXAM: MRI LUMBAR SPINE WITHOUT AND WITH CONTRAST TECHNIQUE: Multiplanar and multiecho pulse sequences of the lumbar spine were obtained without and with intravenous contrast. CONTRAST:  36mL GADAVIST GADOBUTROL 1 MMOL/ML IV SOLN COMPARISON:  MRI L Spine 12/05/22 FINDINGS: Segmentation:  Standard. Alignment:  Physiologic. Vertebrae: Interval postsurgical changes from L4 laminectomy. There is persistent abnormal T2/stir hyperintense  signal within the L3-L4 disc space and abnormal contrast enhancement in the L3-L4 vertebral bodies as well as the prevertebral space at these levels. There is now  abnormal T2/stir hyperintense signal in the interspinous space at the L3-L4 levels as well as the subcutaneous soft tissues, which is nonspecific, and may be due to recent surgical intervention, but superimposed infectious not excluded. Conus medullaris and cauda equina: Conus extends to the L1 level. Conus and cauda equina appear normal. Paraspinal and other soft tissues: There are multiple small fluid collections in the bilateral psoas muscle bodies (series 9, image 28, 34, 26). There is also a 3.0 x 1.0 cm soft tissue abscess in the prevertebral space (series 9, image 20). That likely slightly decreased in size compared to prior exam. Heterogeneous appearance of the right kidney with interval decrease in size of previously seen T2 hyperintense structures, favored to represent evolving and resolving infection Disc levels: Compared to prior exam there is interval decrease in size of the epidural fluid collection at the L4 level, now measuring up to 2.4 x 0.9 cm, previously 2.7 x 0.9 cm. The degree of spinal canal stenosis at the L3-L4 level is also improved, now moderate, previously severe Otherwise there is no significant change in the degree of degenerative change with persistent severe narrowing of the right lateral recess and bilateral neuroforamina at the L5-S1 level IMPRESSION: 1. Interval decrease in size of the epidural abscess at the L4 level, now measuring up to 2.4 x 0.9 cm, previously 2.7 x 0.9 cm. The degree of spinal canal stenosis at the L3-L4 level is also improved, now moderate, previously severe. 2. 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 3. Heterogeneous appearance of the right kidney with interval decrease in size of previously seen T2 hyperintense structures, favored to represent evolving and resolving  infection. Recommend correlation with urinalysis and renal ultrasound for further evaluation. Electronically Signed   By: Marin Roberts M.D.   On: 12/31/2022 15:56    Labs: BMET Recent Labs  Lab 12/27/22 0234 12/28/22 0432 12/29/22 1047 12/30/22 0227 12/31/22 0634 01/01/23 0316 01/02/23 0223  NA 140 140 138 139 140 139 138  K 3.7 3.9 3.4* 3.0* 3.2* 3.3* 3.6  CL 102 103 101 104 107 102 104  CO2 27 26 24 23 23 22  21*  GLUCOSE 93 96 97 102* 90 89 83  BUN 44* 45* 41* 40* 40* 43* 43*  CREATININE 4.00* 4.25* 4.36* 4.36* 4.67* 5.00* 5.18*  CALCIUM 8.2* 8.2* 8.0* 8.2* 8.2* 8.2* 8.3*   CBC Recent Labs  Lab 12/30/22 0227 12/31/22 0634 01/01/23 0316 01/02/23 0223  WBC 14.9* 14.8* 15.2* 13.2*  NEUTROABS 10.5* 10.3* 10.1* 8.3*  HGB 8.2* 8.3* 8.1* 7.8*  HCT 24.9* 26.2* 25.7* 24.3*  MCV 87.7 89.1 89.5 89.0  PLT 321 310 286 278    Medications:     buprenorphine-naloxone  1 tablet Sublingual BID   Chlorhexidine Gluconate Cloth  6 each Topical Q0600   vitamin B-12  1,000 mcg Oral Daily   docusate sodium  200 mg Oral BID   ferrous sulfate  325 mg Oral BID WC   folic acid  1 mg Oral Daily   heparin injection (subcutaneous)  5,000 Units Subcutaneous Q8H   nutrition supplement (JUVEN)  1 packet Oral BID BM   pantoprazole  40 mg Oral Daily  polyethylene glycol  17 g Oral Daily   sertraline  25 mg Oral Daily   tamsulosin  0.4 mg Oral QPC supper   thiamine  500 mg Oral Daily    Elmarie Shiley, MD 01/02/2023, 11:22 AM

## 2023-01-03 DIAGNOSIS — R7881 Bacteremia: Secondary | ICD-10-CM | POA: Diagnosis not present

## 2023-01-03 DIAGNOSIS — B9562 Methicillin resistant Staphylococcus aureus infection as the cause of diseases classified elsewhere: Secondary | ICD-10-CM | POA: Diagnosis not present

## 2023-01-03 LAB — CBC
HCT: 23.8 % — ABNORMAL LOW (ref 39.0–52.0)
Hemoglobin: 7.7 g/dL — ABNORMAL LOW (ref 13.0–17.0)
MCH: 28.6 pg (ref 26.0–34.0)
MCHC: 32.4 g/dL (ref 30.0–36.0)
MCV: 88.5 fL (ref 80.0–100.0)
Platelets: 261 10*3/uL (ref 150–400)
RBC: 2.69 MIL/uL — ABNORMAL LOW (ref 4.22–5.81)
RDW: 14.3 % (ref 11.5–15.5)
WBC: 11 10*3/uL — ABNORMAL HIGH (ref 4.0–10.5)
nRBC: 0 % (ref 0.0–0.2)

## 2023-01-03 LAB — BASIC METABOLIC PANEL
Anion gap: 12 (ref 5–15)
BUN: 41 mg/dL — ABNORMAL HIGH (ref 6–20)
CO2: 23 mmol/L (ref 22–32)
Calcium: 8.4 mg/dL — ABNORMAL LOW (ref 8.9–10.3)
Chloride: 105 mmol/L (ref 98–111)
Creatinine, Ser: 5.27 mg/dL — ABNORMAL HIGH (ref 0.61–1.24)
GFR, Estimated: 13 mL/min — ABNORMAL LOW (ref >60)
Glucose, Bld: 85 mg/dL (ref 70–99)
Potassium: 3.7 mmol/L (ref 3.5–5.1)
Sodium: 140 mmol/L (ref 135–145)

## 2023-01-03 NOTE — Progress Notes (Signed)
Patient ID: Hunter Reyes, male   DOB: [birth year], 47 y.o.   MRN: [id number] [facility] Progress Note   Assessment/ Plan:   1. Acute kidney Injury: Initial injury suspected to be from obstructive uropathy for which he is status post Foley catheter placement.  Differential of infection associated GN entertained but complement levels normal.  Unclearly identified etiology of recurrent worsening of renal function; statistically intrahospital acute kidney injury is often from ATN; overnight renal function slightly worse to essentially same as yesterday's.  He is nonoliguric and I will continue him on intravenous fluids based on his marginally elevated CK level and to help promote additional diuresis.  He does not have any uremic signs/symptoms, volume overload or critical labs to prompt need for dialysis. 2.  Hypokalemia/hypomagnesemia: Secondary to limited oral intake, improving on oral replacement. 3.  Disseminated MRSA infection: Status post right transmetatarsal amputation with wound VAC in place.  Ongoing antimicrobial therapy with daptomycin. 4.  Anemia: Secondary to acute/critical illness and compounded by recent right TMA.  Monitor for PRBC needs.  No overt loss.  Subjective:   Reports intermittent back pain with minimal discomfort from right TMA site.  Concerned about his kidneys (his brother Hunter Reyes is on hemodialysis up in [place])   Objective:   BP (!) 111/58 (BP Location: Left Arm)   Pulse 66   Temp 98 F (36.7 C) (Oral)   Resp 16   Ht 6\' 2"  (1.88 m)   Wt 134.1 kg   SpO2 95%   BMI 37.96 kg/m   Intake/Output Summary (Last 24 hours) at 01/03/2023 0754 Last data filed at 01/03/2023 0324 Gross per 24 hour  Intake 1330.52 ml  Output 2400 ml  Net -1069.48 ml   Weight change:   Physical Exam: Gen: Appears comfortable sitting in bed, watching television CVS: Pulse regular rhythm, normal rate, S1 and S2 normal Resp: Clear to auscultation bilaterally, no  wheezing rales or rhonchi Abd: Soft, flat, nontender, bowel sounds normal Ext: No lower extremity edema, right TMA with wound VAC  Imaging: No results found.  Labs: BMET Recent Labs  Lab 12/28/22 0432 12/29/22 1047 12/30/22 0227 12/31/22 0634 01/01/23 0316 01/02/23 0223 01/03/23 0522  NA 140 138 139 140 139 138 140  K 3.9 3.4* 3.0* 3.2* 3.3* 3.6 3.7  CL 103 101 104 107 102 104 105  CO2 26 24 23 23 22  21* 23  GLUCOSE 96 97 102* 90 89 83 85  BUN 45* 41* 40* 40* 43* 43* 41*  CREATININE 4.25* 4.36* 4.36* 4.67* 5.00* 5.18* 5.27*  CALCIUM 8.2* 8.0* 8.2* 8.2* 8.2* 8.3* 8.4*   CBC Recent Labs  Lab 12/30/22 0227 12/31/22 0634 01/01/23 0316 01/02/23 0223 01/03/23 0522  WBC 14.9* 14.8* 15.2* 13.2* 11.0*  NEUTROABS 10.5* 10.3* 10.1* 8.3*  --   HGB 8.2* 8.3* 8.1* 7.8* 7.7*  HCT 24.9* 26.2* 25.7* 24.3* 23.8*  MCV 87.7 89.1 89.5 89.0 88.5  PLT 321 310 286 278 261    Medications:     buprenorphine-naloxone  1 tablet Sublingual BID   Chlorhexidine Gluconate Cloth  6 each Topical Q0600   vitamin B-12  1,000 mcg Oral Daily   docusate sodium  200 mg Oral BID   ferrous sulfate  325 mg Oral BID WC   folic acid  1 mg Oral Daily   heparin injection (subcutaneous)  5,000 Units Subcutaneous Q8H   nutrition supplement (JUVEN)  1 packet Oral BID BM   pantoprazole  40 mg Oral Daily  polyethylene glycol  17 g Oral Daily   potassium chloride  20 mEq Oral TID   sertraline  25 mg Oral Daily   tamsulosin  0.4 mg Oral QPC supper   thiamine  500 mg Oral Daily    Elmarie Shiley, MD 01/03/2023, 7:54 AM

## 2023-01-03 NOTE — Plan of Care (Signed)
  Problem: Fluid Volume: Goal: Hemodynamic stability will improve Outcome: Progressing   Problem: Clinical Measurements: Goal: Diagnostic test results will improve Outcome: Progressing Goal: Signs and symptoms of infection will decrease Outcome: Progressing   Problem: Respiratory: Goal: Ability to maintain adequate ventilation will improve Outcome: Progressing   Problem: Education: Goal: Knowledge of General Education information will improve Description: Including pain rating scale, medication(s)/side effects and non-pharmacologic comfort measures Outcome: Progressing   Problem: Health Behavior/Discharge Planning: Goal: Ability to manage health-related needs will improve Outcome: Progressing   Problem: Clinical Measurements: Goal: Ability to maintain clinical measurements within normal limits will improve Outcome: Progressing Goal: Will remain free from infection Outcome: Progressing Goal: Diagnostic test results will improve Outcome: Progressing Goal: Respiratory complications will improve Outcome: Progressing Goal: Cardiovascular complication will be avoided Outcome: Progressing   Problem: Activity: Goal: Risk for activity intolerance will decrease Outcome: Progressing   Problem: Nutrition: Goal: Adequate nutrition will be maintained Outcome: Progressing   Problem: Coping: Goal: Level of anxiety will decrease Outcome: Progressing   Problem: Elimination: Goal: Will not experience complications related to bowel motility Outcome: Progressing Goal: Will not experience complications related to urinary retention Outcome: Progressing   Problem: Pain Managment: Goal: General experience of comfort will improve Outcome: Progressing   Problem: Safety: Goal: Ability to remain free from injury will improve Outcome: Progressing   Problem: Skin Integrity: Goal: Risk for impaired skin integrity will decrease Outcome: Progressing   Problem: Education: Goal:  Knowledge of the prescribed therapeutic regimen will improve Outcome: Progressing Goal: Ability to verbalize activity precautions or restrictions will improve Outcome: Progressing Goal: Understanding of discharge needs will improve Outcome: Progressing   Problem: Activity: Goal: Ability to perform//tolerate increased activity and mobilize with assistive devices will improve Outcome: Progressing   Problem: Clinical Measurements: Goal: Postoperative complications will be avoided or minimized Outcome: Progressing   Problem: Self-Care: Goal: Ability to meet self-care needs will improve Outcome: Progressing   Problem: Self-Concept: Goal: Ability to maintain and perform role responsibilities to the fullest extent possible will improve Outcome: Progressing   Problem: Pain Management: Goal: Pain level will decrease with appropriate interventions Outcome: Progressing   Problem: Skin Integrity: Goal: Demonstration of wound healing without infection will improve Outcome: Progressing

## 2023-01-03 NOTE — Progress Notes (Signed)
Physical Therapy Treatment Patient Details Name: Hunter Reyes MRN: [id number] DOB: May 10, 1976 Today's Date: 01/03/2023   History of Present Illness 47 y.o. male admitted 12/10 with AMS, urinary retention and low back pain. Underwent L4 lumbar laminectomy for epidural abscess 12/13. s/p R foot transmet amp 12/17. Pt initiated HD on 12/26. PMH:  bipolar 1 disorder, borderline personality disorder, COPD, crack cocaine abuse, depression, peripheral neuropathy, history of right foot osteomyelitis, panic attack and history of seizures    PT Comments    Pt admitted with above diagnosis. Pt in pain and only agreed to do some LE exercises today. Will continue as pt allows.  Pt currently with functional limitations due to balance and endurance deficits. Pt will benefit from skilled PT to increase their independence and safety with mobility to allow discharge to the venue listed below.      Recommendations for follow up therapy are one component of a multi-disciplinary discharge planning process, led by the attending physician.  Recommendations may be updated based on patient status, additional functional criteria and insurance authorization.  Follow Up Recommendations  Skilled nursing-short term rehab (<3 hours/day) Can patient physically be transported by private vehicle: No   Assistance Recommended at Discharge Frequent or constant Supervision/Assistance  Patient can return home with the following Assistance with cooking/housework;Assist for transportation;Help with stairs or ramp for entrance;A lot of help with walking and/or transfers;A little help with bathing/dressing/bathroom   Equipment Recommendations  Rolling walker (2 wheels);Wheelchair (measurements PT)    Recommendations for Other Services       Precautions / Restrictions Precautions Precautions: Fall;Back Precaution Booklet Issued: No Precaution Comments: watch HR, incontinent stool Required Braces or Orthoses: Other  Brace Other Brace: post op shoe- not present during session Restrictions Weight Bearing Restrictions: Yes RLE Weight Bearing: Non weight bearing Other Position/Activity Restrictions: don darco shoe     Mobility  Bed Mobility               General bed mobility comments: refused OOB or EOB, exercises only    Transfers                        Ambulation/Gait                   Stairs             Wheelchair Mobility    Modified Rankin (Stroke Patients Only)       Balance                                            Cognition Arousal/Alertness: Awake/alert Behavior During Therapy: Flat affect Overall Cognitive Status: No family/caregiver present to determine baseline cognitive functioning Area of Impairment: Attention, Problem solving, Memory                   Current Attention Level: Sustained Memory: Decreased recall of precautions   Safety/Judgement: Decreased awareness of safety Awareness: Emergent Problem Solving: Slow processing General Comments: required frequent cues for NWB with poor carryover. Patient believed it was Saturday        Exercises General Exercises - Lower Extremity Ankle Circles/Pumps: AROM, Both, 10 reps Quad Sets: AROM, Both, 10 reps, Supine Heel Slides: AAROM, Right, Left, 5 reps Hip ABduction/ADduction: AROM, Both, 10 reps, Supine Straight Leg Raises: AROM, Both, 10 reps, Seated Hip Flexion/Marching: AROM, Both,  20 reps, Seated    General Comments        Pertinent Vitals/Pain Pain Assessment Pain Assessment: Faces Faces Pain Scale: Hurts even more Pain Location: back Pain Descriptors / Indicators: Grimacing, Guarding Pain Intervention(s): Limited activity within patient's tolerance, Monitored during session, Patient requesting pain meds-RN notified, Repositioned    Home Living                          Prior Function            PT Goals (current goals can  now be found in the care plan section) Acute Rehab PT Goals Patient Stated Goal: home Progress towards PT goals: Progressing toward goals    Frequency    Min 2X/week      PT Plan Current plan remains appropriate    Co-evaluation              AM-PAC PT "6 Clicks" Mobility   Outcome Measure  Help needed turning from your back to your side while in a flat bed without using bedrails?: None Help needed moving from lying on your back to sitting on the side of a flat bed without using bedrails?: None Help needed moving to and from a bed to a chair (including a wheelchair)?: None Help needed standing up from a chair using your arms (e.g., wheelchair or bedside chair)?: Total Help needed to walk in hospital room?: Total Help needed climbing 3-5 steps with a railing? : Total 6 Click Score: 15    End of Session Equipment Utilized During Treatment: Gait belt Activity Tolerance: Patient limited by pain Patient left: with call bell/phone within reach;in bed;with bed alarm set Nurse Communication: Mobility status PT Visit Diagnosis: Muscle weakness (generalized) (M62.81);Pain;Other abnormalities of gait and mobility (R26.89) Pain - Right/Left: Right Pain - part of body:  (back)     Time: 7096-2836 PT Time Calculation (min) (ACUTE ONLY): 15 min  Charges:  $Therapeutic Exercise: 8-22 mins                     [facility] M,PT Acute Rehab Services [phone]    Alvira Philips 01/03/2023, 2:09 PM

## 2023-01-03 NOTE — Progress Notes (Signed)
PROGRESS NOTE        PATIENT DETAILS Name: Hunter Reyes Age: 47 y.o. Sex: male Date of Birth: [birth year] Admit Date: 12/02/2022 Admitting Physician [name], MD [id number], Edwinna Areola, MD  Brief Summary: Patient is a 47 y.o.  male with history of bipolar disorder, crack cocaine use, peripheral neuropathy-who was transferred from [facility] for AKI/urinary retention-upon further evaluation-he was found to have MRSA bacteremia with right foot abscess requiring TMA, lumbar discitis/abscess-requiring emergent laminectomy.  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  Significant events: 12/10>> admit to [facility]-AKI-urinary retention.  Plans to transfer to [facility]. 12/13>> laminectomy/decompression of ventral lumbar epidural abscess 12/17>> transmetatarsal amputation of right foot 12/21>>voiding trial-foley removed 12/22>>worsening renal function-urinary retention-foley replaced 12/25>>Urine protein creatinine ratio of 7.68-w hematuria-suggestion of new AKI is 2/2 infection GN 12/26>>Temp HD cath placed by IR-HD started 1/2 >> Temp HD cath removed  Significant studies: 12/11>> CT renal stone study: Possible discitis L2-L3/L3-L4, moderate bilateral hydronephrosis/hydroureter 12/13>> MRI L-spine: Discitis/osteomyelitis at L3-L4 with 9 mm thick ventral canal phlegmon/abscess which extends from L3 to L4-L5.  Multiple paraspinal abscesses within the psoas musculature. 12/13>> echo: EF 60-65%, no evidence of valvular vegetations. 12/14>> MRI left foot: No osteomyelitis/abscess 12/15>> MRI right foot: Abscess with osteomyelitis of second/third metatarsals. 12/25>>Urine protein creatinine ratio of 7.68 12/25>> ASO titer: Negative 12/25>> ANA: Negative 12/25>> ANCA titers: Negative 12/25>> GBM antibody: Negative 12/25>> C3/C4: Normal limits. 12/25>> HBV/HCV serology:  1/8 >>MRI - 1. Interval decrease in size of  the epidural abscess at the L4 level, now measuring up to 2.4 x 0.9 cm, previously 2.7 x 0.9 cm. The degree of spinal canal stenosis at the L3-L4 level is also improved, now moderate, previously severe. 2. 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 3. Heterogeneous appearance of the right kidney with interval decrease in size of previously seen T2 hyperintense structures, favored to represent evolving and resolving infection 1/10-11 >> Renal function elevated but improving with fluids, no HD at this time  Significant microbiology data: 12/10>> COVID/influenza PCR: Negative 12/11>> blood culture: MRSA 12/13>> blood culture: MRSA 12/13>> epidural abscess culture: MRSA 12/14>> blood culture: No growth  Procedures: 12/13>> right L4 laminectomy-sublaminar decompression of ventral epidural abscess. 12/17>> transmetatarsal amputation of right foot 12/26>>Right IJ Trialysis CVC placed by IR  Consults: ID Neurosurgery IR Nephrology  Subjective: No acute issues or events overnight, denies nausea vomiting headache fevers chills or chest pain  Objective:  Vitals: Blood pressure (!) 111/58, pulse 66, temperature 98 F (36.7 C), temperature source Oral, resp. rate 16, height 6\' 2"  (1.88 m), weight 134.1 kg, SpO2 95 %.   Exam:  Awake Alert, No new F.N deficits, Normal affect [place].AT,PERRAL Supple Neck, No JVD,   Symmetrical Chest wall movement, Good air movement bilaterally, CTAB RRR,No Gallops, Rubs or new Murmurs,  +ve B.Sounds, Abd Soft, No tenderness,   Right transmetatarsal partial amputation with wound VAC, 1+ edema, Foley catheter in place,    Assessment/Plan:  MRSA bacteremia, L3-L4 discitis with epidural abscess-s/p laminectomy/decompression on 12/05/22 - Right foot osteomyelitis/abscess-s/p  transmetatarsal amputation on 12/09/22  -   Overall improved-repeat cultures negative so far, Orthopedics/neurosurgery following-able to lift both lower extremities off  the bed. Continue to work with PT/OT. - ID recommending 8 weeks of IV Daptomycin -tentative end date 02/03/2023 -Repeat MRI L spine on 01/02/23- repeat MRI was done on 12/31/2022, results suggest improving infection.  ID recommending repeat MRI prior to completion of antibiotics -Given history of drug use-unfortunately will remain inpatient to complete IV antibiotic duration.  Not a candidate for home therapies.  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  -Urine output continues to be appropriate however somewhat cloudy and red today -Creatinine continues to trend upwards despite urine output -Completed intermittent hemodialysis, temporary catheter removed 12/25/22 -Nephrology following - appreciate insight/recs - no indication for dialysis at this time., continue IV fluids, creatinine stabilizing but elevated   Hypokalemia, hypomagnesemia: Replete/recheck as appropriate.  Normocytic anemia - Received 1 unit of packed RBC on 12/17/2022,  -Slowly downtrending in the setting of iron deficiency versus chronic anemia given above  -Continue B12 replacement, iron replacement, folic acid replacement -Change labs to every 48 hours to decrease iatrogenic anemia  Peripheral neuropathy - No longer on Neurontin given worsening AKI. Bipolar 1 disorder - Stable, Continue zoloft Polysubstance abuse - History of cocaine/IV use, continue home Suboxone dosing Dental caries -Per imaging, recommend outpatient dentistry follow-up Morbid Obesity: Estimated body mass index is 37.96 kg/m as calculated from the following:   Height as of this encounter: 6\' 2"  (1.88 m).   Weight as of this  encounter: 134.1 kg.   Code status:   Code Status: Full Code   DVT Prophylaxis: heparin injection 5,000 Units Start: 12/16/22 1000 SCD's Start: 12/09/22 1137   Family Communication: None at bedside  Disposition Plan: Status is: Inpatient Remains inpatient appropriate because: Severity of illness and ongoing need for IV antibiotics through 02/03/2023   Planned Discharge Destination:Home ultimately-but will need to remain inpatient to complete IV daptomycin-end date 02/03/23   Diet: Diet Order             Diet renal with fluid restriction Room service appropriate? Yes; Fluid consistency: Thin  Diet effective now                    MEDICATIONS: Scheduled Meds:  buprenorphine-naloxone  1 tablet Sublingual BID   Chlorhexidine Gluconate Cloth  6 each Topical Q0600   vitamin B-12  1,000 mcg Oral Daily   docusate sodium  200 mg Oral BID   ferrous sulfate  325 mg Oral BID WC   folic acid  1 mg Oral Daily   heparin injection (subcutaneous)  5,000 Units Subcutaneous Q8H   nutrition supplement (JUVEN)  1 packet Oral BID BM   pantoprazole  40 mg Oral Daily   polyethylene glycol  17 g Oral Daily   potassium chloride  20 mEq Oral TID   sertraline  25 mg Oral Daily   tamsulosin  0.4 mg Oral QPC supper   thiamine  500 mg Oral Daily   Continuous Infusions:  DAPTOmycin (CUBICIN) 850 mg in sodium chloride 0.9 % IVPB 850 mg (01/02/23 0041)   lactated ringers 150 mL/hr at 01/02/23 1838   PRN Meds:.alum & mag hydroxide-simeth, bisacodyl, diphenhydrAMINE, guaiFENesin-dextromethorphan, ondansetron **OR** ondansetron (ZOFRAN) IV, oxyCODONE, phenol, tiZANidine, traZODone   I have personally reviewed following labs and imaging studies  LABORATORY DATA:  Recent Labs  Lab 12/29/22 1047 12/30/22 0227 12/31/22 0634 01/01/23 0316 01/02/23 0223 01/03/23 0522  WBC 15.0* 14.9* 14.8* 15.2* 13.2* 11.0*  HGB 8.3* 8.2* 8.3* 8.1* 7.8* 7.7*  HCT 25.3* 24.9* 26.2* 25.7* 24.3* 23.8*  PLT 345  321 310 286 278 261  MCV 87.8 87.7 89.1 89.5 89.0 88.5  MCH 28.8 28.9 28.2 28.2 28.6 28.6  MCHC 32.8 32.9 31.7 31.5 32.1 32.4  RDW 14.5 14.5 14.7 14.6 14.4 14.3  LYMPHSABS 2.0 2.0 1.8 2.0 2.4  --   MONOABS 1.8* 1.6* 1.9* 2.2* 1.7*  --   EOSABS 0.5 0.5 0.5 0.6* 0.7*  --   BASOSABS 0.1 0.1 0.1 0.1 0.1  --      Recent Labs  Lab 12/30/22 0227 12/31/22 0634 01/01/23 0316 01/02/23 0223 01/03/23 0522  NA 139 140 139 138 140  K 3.0* 3.2* 3.3* 3.6 3.7  CL 104 107 102 104 105  CO2 23 23 22  21* 23  ANIONGAP 12 10 15 13 12   GLUCOSE 102* 90 89 83 85  BUN 40* 40* 43* 43* 41*  CREATININE 4.36* 4.67* 5.00* 5.18* 5.27*  CALCIUM 8.2* 8.2* 8.2* 8.3* 8.4*     RADIOLOGY STUDIES/RESULTS: No results found.   LOS: 31 days   [place] on 01/03/2023 at 7:33 AM   -  To page go to [email or web address]

## 2023-01-04 DIAGNOSIS — R7881 Bacteremia: Secondary | ICD-10-CM | POA: Diagnosis not present

## 2023-01-04 DIAGNOSIS — B9562 Methicillin resistant Staphylococcus aureus infection as the cause of diseases classified elsewhere: Secondary | ICD-10-CM | POA: Diagnosis not present

## 2023-01-04 LAB — RENAL FUNCTION PANEL
Albumin: 2.2 g/dL — ABNORMAL LOW (ref 3.5–5.0)
Anion gap: 14 (ref 5–15)
BUN: 37 mg/dL — ABNORMAL HIGH (ref 6–20)
CO2: 20 mmol/L — ABNORMAL LOW (ref 22–32)
Calcium: 8.3 mg/dL — ABNORMAL LOW (ref 8.9–10.3)
Chloride: 105 mmol/L (ref 98–111)
Creatinine, Ser: 4.84 mg/dL — ABNORMAL HIGH (ref 0.61–1.24)
GFR, Estimated: 14 mL/min — ABNORMAL LOW (ref >60)
Glucose, Bld: 87 mg/dL (ref 70–99)
Phosphorus: 5.8 mg/dL — ABNORMAL HIGH (ref 2.5–4.6)
Potassium: 3.4 mmol/L — ABNORMAL LOW (ref 3.5–5.1)
Sodium: 139 mmol/L (ref 135–145)

## 2023-01-04 LAB — CK: Total CK: 432 U/L — ABNORMAL HIGH (ref 49–397)

## 2023-01-04 MED ORDER — LACTATED RINGERS IV SOLN: INTRAVENOUS | Status: DC

## 2023-01-04 NOTE — Progress Notes (Signed)
PROGRESS NOTE        PATIENT DETAILS Name: Hunter Reyes Age: 47 y.o. Sex: male Date of Birth: December 28, 1975 Admit Date: 12/02/2022 Admitting Physician [name], MD [id number], Edwinna Areola, MD  Brief Summary: Patient is a 47 y.o.  male with history of bipolar disorder, crack cocaine use, peripheral neuropathy-who was transferred from [facility] for AKI/urinary retention-upon further evaluation-he was found to have MRSA bacteremia with right foot abscess requiring TMA, lumbar discitis/abscess-requiring emergent laminectomy.  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  Significant events: 12/10>> admit to [facility]-AKI-urinary retention.  Plans to transfer to [facility]. 12/13>> laminectomy/decompression of ventral lumbar epidural abscess 12/17>> transmetatarsal amputation of right foot 12/21>>voiding trial-foley removed 12/22>>worsening renal function-urinary retention-foley replaced 12/25>>Urine protein creatinine ratio of 7.68-w hematuria-suggestion of new AKI is 2/2 infection GN 12/26>>Temp HD cath placed by IR-HD started 1/2 >> Temp HD cath removed  Significant studies: 12/11>> CT renal stone study: Possible discitis L2-L3/L3-L4, moderate bilateral hydronephrosis/hydroureter 12/13>> MRI L-spine: Discitis/osteomyelitis at L3-L4 with 9 mm thick ventral canal phlegmon/abscess which extends from L3 to L4-L5.  Multiple paraspinal abscesses within the psoas musculature. 12/13>> echo: EF 60-65%, no evidence of valvular vegetations. 12/14>> MRI left foot: No osteomyelitis/abscess 12/15>> MRI right foot: Abscess with osteomyelitis of second/third metatarsals. 12/25>>Urine protein creatinine ratio of 7.68 12/25>> ASO titer: Negative 12/25>> ANA: Negative 12/25>> ANCA titers: Negative 12/25>> GBM antibody: Negative 12/25>> C3/C4: Normal limits. 12/25>> HBV/HCV serology:  1/8 >>MRI - 1. Interval decrease in size of  the epidural abscess at the L4 level, now measuring up to 2.4 x 0.9 cm, previously 2.7 x 0.9 cm. The degree of spinal canal stenosis at the L3-L4 level is also improved, now moderate, previously severe. 2. 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 3. Heterogeneous appearance of the right kidney with interval decrease in size of previously seen T2 hyperintense structures, favored to represent evolving and resolving infection 1/10-11 >> Renal function elevated 1/12 >> Renal function minimally recovering  Significant microbiology data: 12/10>> COVID/influenza PCR: Negative 12/11>> blood culture: MRSA 12/13>> blood culture: MRSA 12/13>> epidural abscess culture: MRSA 12/14>> blood culture: No growth  Procedures: 12/13>> right L4 laminectomy-sublaminar decompression of ventral epidural abscess. 12/17>> transmetatarsal amputation of right foot 12/26>>Right IJ Trialysis CVC placed by IR  Consults: ID Neurosurgery IR Nephrology  Subjective: No acute issues or events overnight, denies nausea vomiting headache fevers chills or chest pain  Objective:  Vitals: Blood pressure 114/63, pulse 78, temperature 98.1 F (36.7 C), temperature source Oral, resp. rate 16, height 6\' 2"  (1.88 m), weight 134.1 kg, SpO2 96 %.   Exam:  Awake Alert, No new F.N deficits, Normal affect [place].AT,PERRAL Supple Neck, No JVD,   Symmetrical Chest wall movement, Good air movement bilaterally, CTAB RRR,No Gallops, Rubs or new Murmurs,  +ve B.Sounds, Abd Soft, No tenderness,   Right transmetatarsal partial amputation with wound VAC, 1+ edema, Foley catheter in place,    Assessment/Plan:  MRSA bacteremia, L3-L4 discitis with epidural abscess-s/p laminectomy/decompression on 12/05/22 - Right foot osteomyelitis/abscess-s/p  transmetatarsal amputation on 12/09/22  -   Overall improved-repeat cultures negative so far, Orthopedics/neurosurgery following-able to lift both lower extremities off the bed. Continue to  work with PT/OT. - ID recommending 8 weeks of IV Daptomycin -tentative end date 02/03/2023 -Repeat MRI L spine on 01/02/23- repeat MRI was done on 12/31/2022, results suggest improving infection.  ID recommending repeat MRI prior to completion of antibiotics -Given history of drug use-unfortunately will remain inpatient to complete IV antibiotic duration.  Not a candidate for home therapies.  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  -Urine output continues to be appropriate however somewhat cloudy and red today -Creatinine continues to trend upwards despite urine output -Completed intermittent hemodialysis, temporary catheter removed 12/25/22 -Nephrology following - appreciate insight/recs - no indication for dialysis at this time., continue IV fluids, creatinine stabilizing but elevated   Hypokalemia, hypomagnesemia: Replete/recheck as appropriate.  Normocytic anemia - Received 1 unit of packed RBC on 12/17/2022,  -Slowly downtrending in the setting of iron deficiency versus chronic anemia given above  -Continue B12 replacement, iron replacement, folic acid replacement -Change labs to every 48 hours to decrease iatrogenic anemia  Peripheral neuropathy - No longer on Neurontin given worsening AKI. Bipolar 1 disorder - Stable, Continue zoloft Polysubstance abuse - History of cocaine/IV use, continue home Suboxone dosing Dental caries -Per imaging, recommend outpatient dentistry follow-up Morbid Obesity: Estimated body mass index is 37.96 kg/m as calculated from the following:   Height as of this encounter: 6\' 2"  (1.88 m).   Weight as of this  encounter: 134.1 kg.   Code status:   Code Status: Full Code   DVT Prophylaxis: heparin injection 5,000 Units Start: 12/16/22 1000 SCD's Start: 12/09/22 1137   Family Communication: None at bedside  Disposition Plan: Status is: Inpatient Remains inpatient appropriate because: Severity of illness and ongoing need for IV antibiotics through 02/03/2023   Planned Discharge Destination:Home ultimately-but will need to remain inpatient to complete IV daptomycin-end date 02/03/23   Diet: Diet Order             Diet regular Room service appropriate? Yes; Fluid consistency: Thin  Diet effective now                    MEDICATIONS: Scheduled Meds:  buprenorphine-naloxone  1 tablet Sublingual BID   vitamin B-12  1,000 mcg Oral Daily   docusate sodium  200 mg Oral BID   ferrous sulfate  325 mg Oral BID WC   folic acid  1 mg Oral Daily   heparin injection (subcutaneous)  5,000 Units Subcutaneous Q8H   nutrition supplement (JUVEN)  1 packet Oral BID BM   pantoprazole  40 mg Oral Daily   polyethylene glycol  17 g Oral Daily   sertraline  25 mg Oral Daily   tamsulosin  0.4 mg Oral QPC supper   thiamine  500 mg Oral Daily   Continuous Infusions:  DAPTOmycin (CUBICIN) 850 mg in sodium chloride 0.9 % IVPB 850 mg (01/03/23 2301)   lactated ringers 150 mL/hr at 01/02/23 1838   PRN Meds:.alum & mag hydroxide-simeth, bisacodyl, diphenhydrAMINE, guaiFENesin-dextromethorphan, ondansetron **OR** ondansetron (ZOFRAN) IV, oxyCODONE, phenol, tiZANidine, traZODone   I have personally reviewed following labs and imaging studies  LABORATORY DATA:  Recent Labs  Lab 12/29/22 1047 12/30/22 0227 12/31/22 0634 01/01/23 0316 01/02/23 0223 01/03/23 0522  WBC 15.0* 14.9* 14.8* 15.2* 13.2* 11.0*  HGB 8.3* 8.2* 8.3* 8.1* 7.8* 7.7*  HCT 25.3* 24.9* 26.2* 25.7* 24.3* 23.8*  PLT 345 321 310 286 278 261  MCV  87.8 87.7 89.1 89.5 89.0 88.5  MCH 28.8 28.9 28.2 28.2 28.6 28.6  MCHC 32.8 32.9 31.7  31.5 32.1 32.4  RDW 14.5 14.5 14.7 14.6 14.4 14.3  LYMPHSABS 2.0 2.0 1.8 2.0 2.4  --   MONOABS 1.8* 1.6* 1.9* 2.2* 1.7*  --   EOSABS 0.5 0.5 0.5 0.6* 0.7*  --   BASOSABS 0.1 0.1 0.1 0.1 0.1  --      Recent Labs  Lab 12/30/22 0227 12/31/22 0634 01/01/23 0316 01/02/23 0223 01/03/23 0522  NA 139 140 139 138 140  K 3.0* 3.2* 3.3* 3.6 3.7  CL 104 107 102 104 105  CO2 23 23 22  21* 23  ANIONGAP 12 10 15 13 12   GLUCOSE 102* 90 89 83 85  BUN 40* 40* 43* 43* 41*  CREATININE 4.36* 4.67* 5.00* 5.18* 5.27*  CALCIUM 8.2* 8.2* 8.2* 8.3* 8.4*     RADIOLOGY STUDIES/RESULTS: No results found.   LOS: 32 days   [place] on 01/04/2023 at 7:28 AM   -  To page go to [email or web address]

## 2023-01-04 NOTE — Progress Notes (Signed)
Patient ID: Hunter Reyes, male   DOB: 05/19/1976, 47 y.o.   MRN: [id number] [facility] Progress Note   Assessment/ Plan:   1. Acute kidney Injury: Initial injury suspected to be from obstructive uropathy for which he is status post Foley catheter placement.  Differential of infection associated GN entertained but complement levels normal.  Exact etiology of recurrent renal injury was poorly identified but suspected to be ATN.  He remains nonoliguric and does not have any indications for dialysis at this time.  Labs pending from this morning. 2.  Hypokalemia/hypomagnesemia: Secondary to limited oral intake, improving on oral replacement. 3.  Disseminated MRSA infection: Status post right transmetatarsal amputation with wound VAC in place.  Ongoing antimicrobial therapy with daptomycin. 4.  Anemia: Secondary to acute/critical illness and compounded by recent right TMA.  Monitor for PRBC needs.  No overt loss.  Subjective:   Denies any acute events overnight, still having intermittent mid/lower back pain   Objective:   BP 114/63   Pulse 78   Temp 98.1 F (36.7 C) (Oral)   Resp 16   Ht 6\' 2"  (1.88 m)   Wt 134.1 kg   SpO2 96%   BMI 37.96 kg/m   Intake/Output Summary (Last 24 hours) at 01/04/2023 0713 Last data filed at 01/04/2023 0457 Gross per 24 hour  Intake 474 ml  Output 4550 ml  Net -4076 ml   Weight change:   Physical Exam: Gen: Comfortably sleeping in bed, awakens easily to conversation CVS: Pulse regular rhythm, normal rate, S1 and S2 normal Resp: Clear to auscultation bilaterally, no wheezing rales or rhonchi Abd: Soft, flat, nontender, bowel sounds normal Ext: No lower extremity edema, right TMA with wound VAC  Imaging: No results found.  Labs: BMET Recent Labs  Lab 12/29/22 1047 12/30/22 0227 12/31/22 0634 01/01/23 0316 01/02/23 0223 01/03/23 0522  NA 138 139 140 139 138 140  K 3.4* 3.0* 3.2* 3.3* 3.6 3.7  CL 101 104 107 102 104 105  CO2  24 23 23 22  21* 23  GLUCOSE 97 102* 90 89 83 85  BUN 41* 40* 40* 43* 43* 41*  CREATININE 4.36* 4.36* 4.67* 5.00* 5.18* 5.27*  CALCIUM 8.0* 8.2* 8.2* 8.2* 8.3* 8.4*   CBC Recent Labs  Lab 12/30/22 0227 12/31/22 0634 01/01/23 0316 01/02/23 0223 01/03/23 0522  WBC 14.9* 14.8* 15.2* 13.2* 11.0*  NEUTROABS 10.5* 10.3* 10.1* 8.3*  --   HGB 8.2* 8.3* 8.1* 7.8* 7.7*  HCT 24.9* 26.2* 25.7* 24.3* 23.8*  MCV 87.7 89.1 89.5 89.0 88.5  PLT 321 310 286 278 261    Medications:     buprenorphine-naloxone  1 tablet Sublingual BID   vitamin B-12  1,000 mcg Oral Daily   docusate sodium  200 mg Oral BID   ferrous sulfate  325 mg Oral BID WC   folic acid  1 mg Oral Daily   heparin injection (subcutaneous)  5,000 Units Subcutaneous Q8H   nutrition supplement (JUVEN)  1 packet Oral BID BM   pantoprazole  40 mg Oral Daily   polyethylene glycol  17 g Oral Daily   sertraline  25 mg Oral Daily   tamsulosin  0.4 mg Oral QPC supper   thiamine  500 mg Oral Daily    Elmarie Shiley, MD 01/04/2023, 7:13 AM

## 2023-01-05 DIAGNOSIS — R7881 Bacteremia: Secondary | ICD-10-CM | POA: Diagnosis not present

## 2023-01-05 DIAGNOSIS — B9562 Methicillin resistant Staphylococcus aureus infection as the cause of diseases classified elsewhere: Secondary | ICD-10-CM | POA: Diagnosis not present

## 2023-01-05 LAB — CBC
HCT: 22.9 % — ABNORMAL LOW (ref 39.0–52.0)
Hemoglobin: 7.3 g/dL — ABNORMAL LOW (ref 13.0–17.0)
MCH: 28.2 pg (ref 26.0–34.0)
MCHC: 31.9 g/dL (ref 30.0–36.0)
MCV: 88.4 fL (ref 80.0–100.0)
Platelets: 232 10*3/uL (ref 150–400)
RBC: 2.59 MIL/uL — ABNORMAL LOW (ref 4.22–5.81)
RDW: 14.3 % (ref 11.5–15.5)
WBC: 9.6 10*3/uL (ref 4.0–10.5)
nRBC: 0 % (ref 0.0–0.2)

## 2023-01-05 LAB — BASIC METABOLIC PANEL
Anion gap: 13 (ref 5–15)
BUN: 34 mg/dL — ABNORMAL HIGH (ref 6–20)
CO2: 23 mmol/L (ref 22–32)
Calcium: 8.1 mg/dL — ABNORMAL LOW (ref 8.9–10.3)
Chloride: 102 mmol/L (ref 98–111)
Creatinine, Ser: 4.72 mg/dL — ABNORMAL HIGH (ref 0.61–1.24)
GFR, Estimated: 15 mL/min — ABNORMAL LOW (ref >60)
Glucose, Bld: 95 mg/dL (ref 70–99)
Potassium: 3.2 mmol/L — ABNORMAL LOW (ref 3.5–5.1)
Sodium: 138 mmol/L (ref 135–145)

## 2023-01-05 LAB — MAGNESIUM: Magnesium: 1.4 mg/dL — ABNORMAL LOW (ref 1.7–2.4)

## 2023-01-05 MED ORDER — POTASSIUM CHLORIDE CRYS ER 20 MEQ PO TBCR
20.0000 meq | EXTENDED_RELEASE_TABLET | Freq: Two times a day (BID) | ORAL | Status: AC
  Administered 2023-01-05 (×2): 20 meq via ORAL
  Filled 2023-01-05 (×2): qty 1

## 2023-01-05 MED ORDER — SALINE SPRAY 0.65 % NA SOLN
1.0000 | NASAL | Status: DC | PRN
  Administered 2023-01-05 – 2023-01-06 (×2): 1 via NASAL
  Filled 2023-01-05: qty 44

## 2023-01-05 MED ORDER — MAGNESIUM SULFATE 2 GM/50ML IV SOLN
2.0000 g | Freq: Once | INTRAVENOUS | Status: AC
  Administered 2023-01-05: 2 g via INTRAVENOUS
  Filled 2023-01-05: qty 50

## 2023-01-05 NOTE — Progress Notes (Signed)
Patient ID: Hunter Reyes, male   DOB: [birth year], 47 y.o.   MRN: [id number] [facility] Progress Note   Assessment/ Plan:   1. Acute kidney Injury: Initial injury suspected to be from obstructive uropathy for which he is status post Foley catheter placement.  Differential of infection associated GN entertained but complement levels normal.  Recurrent worsening of renal function suspected to be ATN (poorly identified insult).  He remains nonoliguric and appears to be having some improvement of his renal function based on labs from yesterday and today.  Continue conservative management/monitoring 2.  Hypokalemia/hypomagnesemia: Secondary to limited oral intake, oral replacement has been ordered with add on magnesium to labs from this morning. 3.  Disseminated MRSA infection: Status post right transmetatarsal amputation with wound VAC in place.  Ongoing antimicrobial therapy with daptomycin with improvement of leukocytosis noted. 4.  Anemia: Secondary to acute/critical illness and compounded by recent right TMA.  Monitor for PRBC needs.  No overt loss.  Subjective:   Reports some intermittent back pain and denies any nausea, vomiting or diarrhea.   Objective:   BP (!) 104/58 (BP Location: Left Arm)   Pulse 67   Temp 97.9 F (36.6 C) (Oral)   Resp 18   Ht 6\' 2"  (1.88 m)   Wt 134.1 kg   SpO2 96%   BMI 37.96 kg/m   Intake/Output Summary (Last 24 hours) at 01/05/2023 0723 Last data filed at 01/05/2023 0433 Gross per 24 hour  Intake 500 ml  Output 3950 ml  Net -3450 ml   Weight change:   Physical Exam: Gen: Comfortably sitting up in bed, getting ready to eat breakfast. CVS: Pulse regular rhythm, normal rate, S1 and S2 normal Resp: Clear to auscultation bilaterally, no wheezing rales or rhonchi Abd: Soft, flat, nontender, bowel sounds normal Ext: No lower extremity edema, right TMA with wound VAC  Imaging: No results found.  Labs: BMET Recent Labs  Lab  12/30/22 0227 12/31/22 6301 01/01/23 0316 01/02/23 0223 01/03/23 0522 01/04/23 0536 01/05/23 0425  NA 139 140 139 138 140 139 138  K 3.0* 3.2* 3.3* 3.6 3.7 3.4* 3.2*  CL 104 107 102 104 105 105 102  CO2 23 23 22  21* 23 20* 23  GLUCOSE 102* 90 89 83 85 87 95  BUN 40* 40* 43* 43* 41* 37* 34*  CREATININE 4.36* 4.67* 5.00* 5.18* 5.27* 4.84* 4.72*  CALCIUM 8.2* 8.2* 8.2* 8.3* 8.4* 8.3* 8.1*  PHOS  --   --   --   --   --  5.8*  --    CBC Recent Labs  Lab 12/30/22 0227 12/31/22 0634 01/01/23 0316 01/02/23 0223 01/03/23 0522 01/05/23 0425  WBC 14.9* 14.8* 15.2* 13.2* 11.0* 9.6  NEUTROABS 10.5* 10.3* 10.1* 8.3*  --   --   HGB 8.2* 8.3* 8.1* 7.8* 7.7* 7.3*  HCT 24.9* 26.2* 25.7* 24.3* 23.8* 22.9*  MCV 87.7 89.1 89.5 89.0 88.5 88.4  PLT 321 310 286 278 261 232    Medications:     buprenorphine-naloxone  1 tablet Sublingual BID   vitamin B-12  1,000 mcg Oral Daily   docusate sodium  200 mg Oral BID   ferrous sulfate  325 mg Oral BID WC   folic acid  1 mg Oral Daily   heparin injection (subcutaneous)  5,000 Units Subcutaneous Q8H   nutrition supplement (JUVEN)  1 packet Oral BID BM   pantoprazole  40 mg Oral Daily   polyethylene glycol  17 g Oral  Daily   potassium chloride  20 mEq Oral BID   sertraline  25 mg Oral Daily   tamsulosin  0.4 mg Oral QPC supper   thiamine  500 mg Oral Daily    Elmarie Shiley, MD 01/05/2023, 7:23 AM

## 2023-01-05 NOTE — Progress Notes (Addendum)
PROGRESS NOTE        PATIENT DETAILS Name: Hunter Reyes Age: 47 y.o. Sex: male Date of Birth: 10-Oct-1976 Admit Date: 12/02/2022 Admitting Physician [name], MD YIF:OYDX, Edwinna Areola, MD  Brief Summary: Patient is a 47 y.o.  male with history of bipolar disorder, crack cocaine use, peripheral neuropathy-who was transferred from [facility] for AKI/urinary retention-upon further evaluation-he was found to have MRSA bacteremia with right foot abscess requiring TMA, lumbar discitis/abscess-requiring emergent laminectomy.  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  Significant events: 12/10>> admit to [facility] at [facility]-AKI-urinary retention.  Plans to transfer to [facility]. 12/13>> laminectomy/decompression of ventral lumbar epidural abscess 12/17>> transmetatarsal amputation of right foot 12/21>>voiding trial-foley removed 12/22>>worsening renal function-urinary retention-foley replaced 12/25>>Urine protein creatinine ratio of 7.68-w hematuria-suggestion of new AKI is 2/2 infection GN 12/26>>Temp HD cath placed by IR-HD started 1/2 >> Temp HD cath removed 1/12>>Wound vac removed  Significant studies: 12/11>> CT renal stone study: Possible discitis L2-L3/L3-L4, moderate bilateral hydronephrosis/hydroureter 12/13>> MRI L-spine: Discitis/osteomyelitis at L3-L4 with 9 mm thick ventral canal phlegmon/abscess which extends from L3 to L4-L5.  Multiple paraspinal abscesses within the psoas musculature. 12/13>> echo: EF 60-65%, no evidence of valvular vegetations. 12/14>> MRI left foot: No osteomyelitis/abscess 12/15>> MRI right foot: Abscess with osteomyelitis of second/third metatarsals. 12/25>>Urine protein creatinine ratio of 7.68 12/25>> ASO titer: Negative 12/25>> ANA: Negative 12/25>> ANCA titers: Negative 12/25>> GBM antibody: Negative 12/25>> C3/C4: Normal limits. 12/25>> HBV/HCV serology:  1/8 >>MRI - 1.  Interval decrease in size of the epidural abscess at the L4 level, now measuring up to 2.4 x 0.9 cm, previously 2.7 x 0.9 cm. The degree of spinal canal stenosis at the L3-L4 level is also improved, now moderate, previously severe. 2. 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 3. Heterogeneous appearance of the right kidney with interval decrease in size of previously seen T2 hyperintense structures, favored to represent evolving and resolving infection 1/10-11 >> Renal function elevated 1/12 >> Renal function minimally recovering  Significant microbiology data: 12/10>> COVID/influenza PCR: Negative 12/11>> blood culture: MRSA 12/13>> blood culture: MRSA 12/13>> epidural abscess culture: MRSA 12/14>> blood culture: No growth  Procedures: 12/13>> right L4 laminectomy-sublaminar decompression of ventral epidural abscess. 12/17>> transmetatarsal amputation of right foot 12/26>>Right IJ Trialysis CVC placed by IR  Consults: ID Neurosurgery IR Nephrology  Subjective: No acute issues or events overnight, denies nausea vomiting headache fevers chills or chest pain  Objective:  Vitals: Blood pressure (!) 104/58, pulse 67, temperature 97.9 F (36.6 C), temperature source Oral, resp. rate 18, height 6\' 2"  (1.88 m), weight 134.1 kg, SpO2 96 %.   Exam:  Awake Alert, No new F.N deficits, Normal affect Tolstoy.AT,PERRAL Supple Neck, No JVD,   Symmetrical Chest wall movement, Good air movement bilaterally, CTAB RRR,No Gallops, Rubs or new Murmurs,  +ve B.Sounds, Abd Soft, No tenderness,   Right transmetatarsal partial amputation with wound bandage clean dry intact   Assessment/Plan:  MRSA bacteremia, L3-L4 discitis with epidural abscess-s/p laminectomy/decompression on 12/05/22 - Right foot  osteomyelitis/abscess-s/p transmetatarsal amputation on 12/09/22  -Overall improved-repeat cultures negative so far, Orthopedics/neurosurgery following-able to lift both lower extremities off the bed. Continue to work with PT/OT. -  Wound vac removed 1/12 per Ortho - appreciate insight/recommendations. - ID recommending 8 weeks of IV Daptomycin -tentative end date 02/03/2023 -Repeat MRI L spine on 01/02/23- repeat MRI was done on 12/31/2022, results suggest improving infection.  ID recommending repeat MRI prior to completion of antibiotics -Given history of drug use-unfortunately will remain inpatient to complete IV antibiotic duration.  Not a candidate for home therapies.  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  -Urine output continues to be appropriate however somewhat cloudy and red today -Creatinine continues to trend upwards despite urine output -Completed intermittent hemodialysis, temporary catheter removed 12/25/22 -Nephrology following - appreciate insight/recs - no indication for dialysis at this time., continue IV fluids, creatinine stabilizing but elevated   Hypokalemia, hypomagnesemia: Replete/recheck as appropriate.  Normocytic anemia - Received 1 unit of packed RBC on 12/17/2022,  -Slowly downtrending in the setting of iron deficiency versus chronic anemia given above  -Continue B12 replacement, iron replacement, folic acid replacement -Change labs to every 48 hours to decrease iatrogenic anemia  Peripheral neuropathy - No longer on Neurontin given worsening AKI. Bipolar 1 disorder - Stable, Continue zoloft Polysubstance abuse - History of cocaine/IV use, continue home Suboxone dosing Dental caries -Per imaging, recommend outpatient dentistry follow-up Morbid Obesity: Estimated body mass index is 37.96 kg/m as calculated  from the following:   Height as of this encounter: 6\' 2"  (1.88 m).   Weight as of this encounter: 134.1 kg.   Code status:   Code Status: Full Code   DVT Prophylaxis: heparin injection 5,000 Units Start: 12/16/22 1000 SCD's Start: 12/09/22 1137   Family Communication: None at bedside  Disposition Plan: Status is: Inpatient Remains inpatient appropriate because: Severity of illness and ongoing need for IV antibiotics through 02/03/2023   Planned Discharge Destination:Home ultimately-but will need to remain inpatient to complete IV daptomycin-end date 02/03/23   Diet: Diet Order             Diet regular Room service appropriate? Yes; Fluid consistency: Thin  Diet effective now                    MEDICATIONS: Scheduled Meds:  buprenorphine-naloxone  1 tablet Sublingual BID   vitamin B-12  1,000 mcg Oral Daily   docusate sodium  200 mg Oral BID   ferrous sulfate  325 mg Oral BID WC   folic acid  1 mg Oral Daily   heparin injection (subcutaneous)  5,000 Units Subcutaneous Q8H   nutrition supplement (JUVEN)  1 packet Oral BID BM   pantoprazole  40 mg Oral Daily   polyethylene glycol  17 g Oral Daily   potassium chloride  20 mEq Oral BID   sertraline  25 mg Oral Daily   tamsulosin  0.4 mg Oral QPC supper   thiamine  500 mg Oral Daily   Continuous Infusions:  DAPTOmycin (CUBICIN) 850 mg in sodium chloride 0.9 % IVPB 850 mg (01/03/23 2301)   lactated ringers 50 mL/hr at 01/04/23 1252   PRN Meds:.alum & mag hydroxide-simeth, bisacodyl, diphenhydrAMINE, guaiFENesin-dextromethorphan, ondansetron **OR** ondansetron (ZOFRAN) IV, oxyCODONE, phenol, tiZANidine, traZODone   I have personally reviewed following labs and imaging studies  LABORATORY DATA:  Recent Labs  Lab 12/29/22 1047 12/30/22 0227 12/31/22 0634 01/01/23 0316 01/02/23 0223 01/03/23 0522 01/05/23 0425  WBC 15.0* 14.9* 14.8* 15.2* 13.2* 11.0* 9.6  HGB 8.3* 8.2* 8.3* 8.1* 7.8* 7.7*  7.3*  HCT 25.3*  24.9* 26.2* 25.7* 24.3* 23.8* 22.9*  PLT 345 321 310 286 278 261 232  MCV 87.8 87.7 89.1 89.5 89.0 88.5 88.4  MCH 28.8 28.9 28.2 28.2 28.6 28.6 28.2  MCHC 32.8 32.9 31.7 31.5 32.1 32.4 31.9  RDW 14.5 14.5 14.7 14.6 14.4 14.3 14.3  LYMPHSABS 2.0 2.0 1.8 2.0 2.4  --   --   MONOABS 1.8* 1.6* 1.9* 2.2* 1.7*  --   --   EOSABS 0.5 0.5 0.5 0.6* 0.7*  --   --   BASOSABS 0.1 0.1 0.1 0.1 0.1  --   --      Recent Labs  Lab 01/01/23 0316 01/02/23 0223 01/03/23 0522 01/04/23 0536 01/05/23 0425  NA 139 138 140 139 138  K 3.3* 3.6 3.7 3.4* 3.2*  CL 102 104 105 105 102  CO2 22 21* 23 20* 23  ANIONGAP 15 13 12 14 13   GLUCOSE 89 83 85 87 95  BUN 43* 43* 41* 37* 34*  CREATININE 5.00* 5.18* 5.27* 4.84* 4.72*  ALBUMIN  --   --   --  2.2*  --   CALCIUM 8.2* 8.3* 8.4* 8.3* 8.1*     RADIOLOGY STUDIES/RESULTS: No results found.   LOS: 33 days   [place] on 01/05/2023 at 7:51 AM   -  To page go to [email or web address]

## 2023-01-06 DIAGNOSIS — B9562 Methicillin resistant Staphylococcus aureus infection as the cause of diseases classified elsewhere: Secondary | ICD-10-CM | POA: Diagnosis not present

## 2023-01-06 DIAGNOSIS — R7881 Bacteremia: Secondary | ICD-10-CM | POA: Diagnosis not present

## 2023-01-06 LAB — RENAL FUNCTION PANEL
Albumin: 2.3 g/dL — ABNORMAL LOW (ref 3.5–5.0)
Anion gap: 12 (ref 5–15)
BUN: 35 mg/dL — ABNORMAL HIGH (ref 6–20)
CO2: 23 mmol/L (ref 22–32)
Calcium: 8.6 mg/dL — ABNORMAL LOW (ref 8.9–10.3)
Chloride: 105 mmol/L (ref 98–111)
Creatinine, Ser: 4.59 mg/dL — ABNORMAL HIGH (ref 0.61–1.24)
GFR, Estimated: 15 mL/min — ABNORMAL LOW (ref >60)
Glucose, Bld: 90 mg/dL (ref 70–99)
Phosphorus: 5.9 mg/dL — ABNORMAL HIGH (ref 2.5–4.6)
Potassium: 3.2 mmol/L — ABNORMAL LOW (ref 3.5–5.1)
Sodium: 140 mmol/L (ref 135–145)

## 2023-01-06 MED ORDER — MAGNESIUM OXIDE -MG SUPPLEMENT 400 (240 MG) MG PO TABS
400.0000 mg | ORAL_TABLET | Freq: Two times a day (BID) | ORAL | Status: DC
  Administered 2023-01-06 – 2023-02-03 (×57): 400 mg via ORAL
  Filled 2023-01-06 (×61): qty 1

## 2023-01-06 MED ORDER — POTASSIUM CHLORIDE CRYS ER 20 MEQ PO TBCR
20.0000 meq | EXTENDED_RELEASE_TABLET | Freq: Three times a day (TID) | ORAL | Status: AC
  Administered 2023-01-06 (×3): 20 meq via ORAL
  Filled 2023-01-06 (×3): qty 1

## 2023-01-06 NOTE — Progress Notes (Signed)
Pharmacy Antibiotic Note  Hunter Reyes is a 47 y.o. male admitted on 12/02/2022 with disseminated MRSA bacteremia and epidural abscess with discitis/osteomyelitis. Pharmacy has been consulted for Daptomycin dosing. Repeat blood culture on 12/14 no growth. ID team following and planning end date 02/03/23 (8 weeks from 12/09/22).  AKI, Daptomycin regimen adjusted from q24h to Q48h on 12/15/22. Garrison placed 12/26 and hemodialysis done.  Creatinine now improving.  1/14: CK measurement yesterday resulted at 432, improved significantly from 933 on 1/10. Daptomycin remains q48 hours for CrCl <30, last dose 1/14 near 0100.   Plan:  Continue Daptomycin to 850 mg (~8 mg/kg adjBW) IV every 48 hours,based on renal function, crcl 29 ml/min. Next Dose due  tomorrow  1/15 @ 23:00. Will recheck CK on Monday 1/15 Follow renal function   Height: 6\' 2"  (188 cm) Weight: 134.1 kg (295 lb 10.2 oz) IBW/kg (Calculated) : 82.2  Temp (24hrs), Avg:98.2 F (36.8 C), Min:97.9 F (36.6 C), Max:98.7 F (37.1 C)  Recent Labs  Lab 12/31/22 0634 01/01/23 0316 01/02/23 0223 01/03/23 0522 01/04/23 0536 01/05/23 0425 01/06/23 0253  WBC 14.8* 15.2* 13.2* 11.0*  --  9.6  --   CREATININE 4.67* 5.00* 5.18* 5.27* 4.84* 4.72* 4.59*     Estimated Creatinine Clearance: 29.6 mL/min (A) (by C-G formula based on SCr of 4.59 mg/dL (H)).    Allergies  Allergen Reactions   Darvocet [Propoxyphene N-Acetaminophen] Hives and Itching   Tramadol Hives and Itching   Other Hives    Antimicrobials this admission: Ceftriaxone 12/11 x 1 dose Vancomycin 12/11 x 1 dose Cefepime 12/11>>12/12 Metronidazole 12/11>>12/12 Daptomycin 12/13 >>(02/03/23)  CK now QThursday 12/11: 40 12/13: 404 12/14: 201 12/20: 90  12/21: 84 12/28: 134 1/1: 88 (random, off schedule measurement)  1/8: 981  (less than 5x upper normal> okay> will recheck CK on 01/02/23  Microbiology results: 12/03 urine: >100K/ml MRSA and Staph epi 12/10  COVID, flu and RSV: negative 12/11 blood: MRSA 12/11 urine: 20K/ml MRSA 12/13 blood: MRSA and Staph epi 12/13 abscess(lumbar 4 epidural space): rare MRSA  12/14 blood: negative  Thank you for allowing pharmacy to be a part of this patient's care.  Vicenta Dunning, PharmD  PGY1 Pharmacy Resident

## 2023-01-06 NOTE — Progress Notes (Addendum)
Patient ID: Hunter Reyes, male   DOB: 1976-01-07, 47 y.o.   MRN: [id number] [facility] Progress Note   Assessment/ Plan:   1. Acute kidney Injury: Initial injury suspected to be from obstructive uropathy for which he is status post Foley catheter placement.  Differential of infection associated GN entertained but complement levels normal.  Nephrology was consulted for recurrent worsening of renal function suspected to be ATN (poorly identified insult) after earlier suffering dialysis dependence acute kidney injury.  He currently has robust urine output with gradual improvement of renal function seen on labs.  Will discontinue IV fluids today and continue to follow for renal recovery. 2.  Hypokalemia/hypomagnesemia: Secondary to limited oral intake, oral replacement has been ordered with additional magnesium replacement.  3.  Disseminated MRSA infection: Status post right transmetatarsal amputation with wound VAC in place.  Ongoing antimicrobial therapy with daptomycin with improvement of leukocytosis noted. 4.  Anemia: Secondary to acute/critical illness and compounded by recent right TMA.  Monitor for PRBC needs.  No overt loss.  Subjective:   Denies complaints other than intermittent back pain and discomfort with positioning himself.  Denies any nausea, vomiting or dysgeusia.   Objective:   BP 125/83 (BP Location: Right Arm)   Pulse 73   Temp 98 F (36.7 C) (Oral)   Resp 16   Ht 6\' 2"  (1.88 m)   Wt 134.1 kg   SpO2 96%   BMI 37.96 kg/m   Intake/Output Summary (Last 24 hours) at 01/06/2023 0734 Last data filed at 01/06/2023 0325 Gross per 24 hour  Intake 1350 ml  Output 4050 ml  Net -2700 ml   Weight change:   Physical Exam: Gen: Appears comfortable resting in bed, awake/alert CVS: Pulse regular rhythm, normal rate, S1 and S2 normal Resp: Clear to auscultation bilaterally, no wheezing rales or rhonchi Abd: Soft, flat, nontender, bowel sounds normal Ext: No  lower extremity edema, right TMA with wound VAC  Imaging: No results found.  Labs: BMET Recent Labs  Lab 12/31/22 0634 01/01/23 0316 01/02/23 0223 01/03/23 0522 01/04/23 0536 01/05/23 0425 01/06/23 0253  NA 140 139 138 140 139 138 140  K 3.2* 3.3* 3.6 3.7 3.4* 3.2* 3.2*  CL 107 102 104 105 105 102 105  CO2 23 22 21* 23 20* 23 23  GLUCOSE 90 89 83 85 87 95 90  BUN 40* 43* 43* 41* 37* 34* 35*  CREATININE 4.67* 5.00* 5.18* 5.27* 4.84* 4.72* 4.59*  CALCIUM 8.2* 8.2* 8.3* 8.4* 8.3* 8.1* 8.6*  PHOS  --   --   --   --  5.8*  --  5.9*   CBC Recent Labs  Lab 12/31/22 0634 01/01/23 0316 01/02/23 0223 01/03/23 0522 01/05/23 0425  WBC 14.8* 15.2* 13.2* 11.0* 9.6  NEUTROABS 10.3* 10.1* 8.3*  --   --   HGB 8.3* 8.1* 7.8* 7.7* 7.3*  HCT 26.2* 25.7* 24.3* 23.8* 22.9*  MCV 89.1 89.5 89.0 88.5 88.4  PLT 310 286 278 261 232    Medications:     buprenorphine-naloxone  1 tablet Sublingual BID   vitamin B-12  1,000 mcg Oral Daily   docusate sodium  200 mg Oral BID   ferrous sulfate  325 mg Oral BID WC   folic acid  1 mg Oral Daily   heparin injection (subcutaneous)  5,000 Units Subcutaneous Q8H   nutrition supplement (JUVEN)  1 packet Oral BID BM   pantoprazole  40 mg Oral Daily   polyethylene glycol  17 g Oral Daily   potassium chloride  20 mEq Oral TID   sertraline  25 mg Oral Daily   tamsulosin  0.4 mg Oral QPC supper   thiamine  500 mg Oral Daily    Elmarie Shiley, MD 01/06/2023, 7:34 AM

## 2023-01-06 NOTE — Progress Notes (Signed)
PROGRESS NOTE        PATIENT DETAILS Name: Hunter Reyes Age: 47 y.o. Sex: male Date of Birth: 04/30/1976 Admit Date: 12/02/2022 Admitting Physician [name], MD [id number], Edwinna Areola, MD  Brief Summary: Patient is a 47 y.o.  male with history of bipolar disorder, crack cocaine use, peripheral neuropathy-who was transferred from [facility] for AKI/urinary retention-upon further evaluation-he was found to have MRSA bacteremia with right foot abscess requiring TMA, lumbar discitis/abscess-requiring emergent laminectomy.  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  Significant events: 12/10>> admit to [facility] at [facility]-AKI-urinary retention.  Plans to transfer to [facility]. 12/13>> laminectomy/decompression of ventral lumbar epidural abscess 12/17>> transmetatarsal amputation of right foot 12/21>>voiding trial-foley removed 12/22>>worsening renal function-urinary retention-foley replaced 12/25>>Urine protein creatinine ratio of 7.68-w hematuria-suggestion of new AKI is 2/2 infection GN 12/26>>Temp HD cath placed by IR-HD started 1/2 >> Temp HD cath removed 1/12>>Wound vac removed  Significant studies: 12/11>> CT renal stone study: Possible discitis L2-L3/L3-L4, moderate bilateral hydronephrosis/hydroureter 12/13>> MRI L-spine: Discitis/osteomyelitis at L3-L4 with 9 mm thick ventral canal phlegmon/abscess which extends from L3 to L4-L5.  Multiple paraspinal abscesses within the psoas musculature. 12/13>> echo: EF 60-65%, no evidence of valvular vegetations. 12/14>> MRI left foot: No osteomyelitis/abscess 12/15>> MRI right foot: Abscess with osteomyelitis of second/third metatarsals. 12/25>>Urine protein creatinine ratio of 7.68 12/25>> ASO titer: Negative 12/25>> ANA: Negative 12/25>> ANCA titers: Negative 12/25>> GBM antibody: Negative 12/25>> C3/C4: Normal limits. 12/25>> HBV/HCV serology:  1/8 >>MRI - 1.  Interval decrease in size of the epidural abscess at the L4 level, now measuring up to 2.4 x 0.9 cm, previously 2.7 x 0.9 cm. The degree of spinal canal stenosis at the L3-L4 level is also improved, now moderate, previously severe. 2. 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 3. Heterogeneous appearance of the right kidney with interval decrease in size of previously seen T2 hyperintense structures, favored to represent evolving and resolving infection 1/10-11 >> Renal function elevated 1/12 >> Renal function minimally recovering  Significant microbiology data: 12/10>> COVID/influenza PCR: Negative 12/11>> blood culture: MRSA 12/13>> blood culture: MRSA 12/13>> epidural abscess culture: MRSA 12/14>> blood culture: No growth  Procedures: 12/13>> right L4 laminectomy-sublaminar decompression of ventral epidural abscess. 12/17>> transmetatarsal amputation of right foot 12/26>>Right IJ Trialysis CVC placed by IR  Consults: ID Neurosurgery IR Nephrology  Subjective: No acute issues or events overnight, denies nausea vomiting headache fevers chills or chest pain  Objective:  Vitals: Blood pressure 125/83, pulse 73, temperature 98 F (36.7 C), temperature source Oral, resp. rate 16, height 6\' 2"  (1.88 m), weight 134.1 kg, SpO2 96 %.   Exam:  Awake Alert, No new F.N deficits, Normal affect [place].AT,PERRAL Supple Neck, No JVD,   Symmetrical Chest wall movement, Good air movement bilaterally, CTAB RRR,No Gallops, Rubs or new Murmurs,  +ve B.Sounds, Abd Soft, No tenderness,   Right transmetatarsal partial amputation with wound bandage clean dry intact   Assessment/Plan:  MRSA bacteremia, L3-L4 discitis with epidural abscess-s/p laminectomy/decompression on 12/05/22 - Right foot  osteomyelitis/abscess-s/p transmetatarsal amputation on 12/09/22  -Overall improved-repeat cultures negative so far, Orthopedics/neurosurgery following-able to lift both lower extremities off the bed. Continue to work with PT/OT. -Wound  vac removed 1/12 per Ortho - appreciate insight/recommendations. - ID recommending 8 weeks of IV Daptomycin -tentative end date 02/03/2023 -Repeat MRI L spine on 01/02/23- repeat MRI was done on 12/31/2022, results suggest improving infection.  ID recommending repeat MRI prior to completion of antibiotics -Given history of drug use-unfortunately will remain inpatient to complete IV antibiotic duration.  Not a candidate for home therapies.  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  -Urine output continues to be appropriate however somewhat cloudy and red today -Creatinine continues to trend upwards despite urine output -Completed intermittent hemodialysis, temporary catheter removed 12/25/22 -Nephrology following - appreciate insight/recs - no indication for dialysis at this time., continue IV fluids, creatinine stabilizing but elevated   Hypokalemia, hypomagnesemia: Replete/recheck as appropriate.  Normocytic anemia - Received 1 unit of packed RBC on 12/17/2022,  -Slowly downtrending in the setting of iron deficiency versus chronic anemia given above  -Continue B12 replacement, iron replacement, folic acid replacement -Change labs to every 48 hours to decrease iatrogenic anemia  Peripheral neuropathy - No longer on Neurontin given worsening AKI. Bipolar 1 disorder - Stable, Continue zoloft Polysubstance abuse - History of cocaine/IV use, continue home Suboxone dosing Dental caries -Per imaging, recommend outpatient dentistry follow-up Morbid Obesity: Estimated body mass index is 37.96 kg/m as calculated  from the following:   Height as of this encounter: 6\' 2"  (1.88 m).   Weight as of this encounter: 134.1 kg.   Code status:   Code Status: Full Code   DVT Prophylaxis: heparin injection 5,000 Units Start: 12/16/22 1000 SCD's Start: 12/09/22 1137   Family Communication: None at bedside  Disposition Plan: Status is: Inpatient Remains inpatient appropriate because: Severity of illness and ongoing need for IV antibiotics through 02/03/2023   Planned Discharge Destination:Home ultimately-but will need to remain inpatient to complete IV daptomycin-end date 02/03/23   Diet: Diet Order             Diet regular Room service appropriate? Yes; Fluid consistency: Thin  Diet effective now                    MEDICATIONS: Scheduled Meds:  buprenorphine-naloxone  1 tablet Sublingual BID   vitamin B-12  1,000 mcg Oral Daily   docusate sodium  200 mg Oral BID   ferrous sulfate  325 mg Oral BID WC   folic acid  1 mg Oral Daily   heparin injection (subcutaneous)  5,000 Units Subcutaneous Q8H   magnesium oxide  400 mg Oral BID   nutrition supplement (JUVEN)  1 packet Oral BID BM   pantoprazole  40 mg Oral Daily   polyethylene glycol  17 g Oral Daily   potassium chloride  20 mEq Oral TID   sertraline  25 mg Oral Daily   tamsulosin  0.4 mg Oral QPC supper   thiamine  500 mg Oral Daily   Continuous Infusions:  DAPTOmycin (CUBICIN) 850 mg in sodium chloride 0.9 % IVPB 850 mg (01/06/23 0051)   PRN Meds:.alum & mag hydroxide-simeth, bisacodyl, diphenhydrAMINE, guaiFENesin-dextromethorphan, ondansetron **OR** ondansetron (ZOFRAN) IV, oxyCODONE, phenol, sodium chloride, tiZANidine, traZODone   I have personally reviewed following labs and imaging studies  LABORATORY DATA:  Recent Labs  Lab 12/31/22 0634 01/01/23 0316 01/02/23 0223 01/03/23 0522 01/05/23 0425  WBC 14.8* 15.2* 13.2* 11.0* 9.6  HGB 8.3* 8.1* 7.8* 7.7* 7.3*  HCT 26.2* 25.7* 24.3* 23.8*  22.9*  PLT 310 286 278 261 232   MCV 89.1 89.5 89.0 88.5 88.4  MCH 28.2 28.2 28.6 28.6 28.2  MCHC 31.7 31.5 32.1 32.4 31.9  RDW 14.7 14.6 14.4 14.3 14.3  LYMPHSABS 1.8 2.0 2.4  --   --   MONOABS 1.9* 2.2* 1.7*  --   --   EOSABS 0.5 0.6* 0.7*  --   --   BASOSABS 0.1 0.1 0.1  --   --      Recent Labs  Lab 01/02/23 0223 01/03/23 0522 01/04/23 0536 01/05/23 0421 01/05/23 0425 01/06/23 0253  NA 138 140 139  --  138 140  K 3.6 3.7 3.4*  --  3.2* 3.2*  CL 104 105 105  --  102 105  CO2 21* 23 20*  --  23 23  ANIONGAP 13 12 14   --  13 12  GLUCOSE 83 85 87  --  95 90  BUN 43* 41* 37*  --  34* 35*  CREATININE 5.18* 5.27* 4.84*  --  4.72* 4.59*  ALBUMIN  --   --  2.2*  --   --  2.3*  MG  --   --   --  1.4*  --   --   CALCIUM 8.3* 8.4* 8.3*  --  8.1* 8.6*     RADIOLOGY STUDIES/RESULTS: No results found.   LOS: 34 days   [place] on 01/06/2023 at 11:01 AM   -  To page go to [email or web address]

## 2023-01-07 DIAGNOSIS — R7881 Bacteremia: Secondary | ICD-10-CM | POA: Diagnosis not present

## 2023-01-07 DIAGNOSIS — B9562 Methicillin resistant Staphylococcus aureus infection as the cause of diseases classified elsewhere: Secondary | ICD-10-CM | POA: Diagnosis not present

## 2023-01-07 LAB — BASIC METABOLIC PANEL
Anion gap: 12 (ref 5–15)
BUN: 38 mg/dL — ABNORMAL HIGH (ref 6–20)
CO2: 22 mmol/L (ref 22–32)
Calcium: 8.6 mg/dL — ABNORMAL LOW (ref 8.9–10.3)
Chloride: 106 mmol/L (ref 98–111)
Creatinine, Ser: 4.43 mg/dL — ABNORMAL HIGH (ref 0.61–1.24)
GFR, Estimated: 16 mL/min — ABNORMAL LOW (ref >60)
Glucose, Bld: 89 mg/dL (ref 70–99)
Potassium: 4.1 mmol/L (ref 3.5–5.1)
Sodium: 140 mmol/L (ref 135–145)

## 2023-01-07 LAB — CBC
HCT: 23.6 % — ABNORMAL LOW (ref 39.0–52.0)
Hemoglobin: 7.4 g/dL — ABNORMAL LOW (ref 13.0–17.0)
MCH: 28.1 pg (ref 26.0–34.0)
MCHC: 31.4 g/dL (ref 30.0–36.0)
MCV: 89.7 fL (ref 80.0–100.0)
Platelets: 235 10*3/uL (ref 150–400)
RBC: 2.63 MIL/uL — ABNORMAL LOW (ref 4.22–5.81)
RDW: 14.6 % (ref 11.5–15.5)
WBC: 8.5 10*3/uL (ref 4.0–10.5)
nRBC: 0 % (ref 0.0–0.2)

## 2023-01-07 LAB — MAGNESIUM: Magnesium: 1.7 mg/dL (ref 1.7–2.4)

## 2023-01-07 LAB — CK: Total CK: 146 U/L (ref 49–397)

## 2023-01-07 NOTE — Progress Notes (Signed)
Physical Therapy Treatment Patient Details Name: Hunter Reyes MRN: [id number] DOB: Jun 06, 1976 Today's Date: 01/07/2023   History of Present Illness 47 y.o. male admitted 12/10 with AMS, urinary retention and low back pain. Underwent L4 lumbar laminectomy for epidural abscess 12/13. s/p R foot transmet amp 12/17. Pt initiated HD on 12/26. PMH:  bipolar 1 disorder, borderline personality disorder, COPD, crack cocaine abuse, depression, peripheral neuropathy, history of right foot osteomyelitis, panic attack and history of seizures    PT Comments    Pt admitted with above diagnosis. Pt was able to transfer to chair drop arm recliner scooting laterally with min physical assist to make sure pt maintains NWB right LE as well as min cues for this.  Pt tends to not remember weight bearing status.  Pt Performed some exercises once in chair. Pt attempted recliner push ups but could not lift buttocks off chair pt pt.  Pt met 0/4 goals set as he had refusal last week and pain limits him at times. Will continue to follow acutely. Revised goals.  Pt currently with functional limitations due to balance and endurance deficits. Pt will benefit from skilled PT to increase their independence and safety with mobility to allow discharge to the venue listed below.      Recommendations for follow up therapy are one component of a multi-disciplinary discharge planning process, led by the attending physician.  Recommendations may be updated based on patient status, additional functional criteria and insurance authorization.  Follow Up Recommendations  Skilled nursing-short term rehab (<3 hours/day) Can patient physically be transported by private vehicle: No   Assistance Recommended at Discharge Frequent or constant Supervision/Assistance  Patient can return home with the following Assistance with cooking/housework;Assist for transportation;Help with stairs or ramp for entrance;A lot of help with walking and/or  transfers;A little help with bathing/dressing/bathroom   Equipment Recommendations  Rolling walker (2 wheels);Wheelchair (measurements PT)    Recommendations for Other Services       Precautions / Restrictions Precautions Precautions: Fall;Back Precaution Booklet Issued: No Precaution Comments: watch HR, incontinent stool Required Braces or Orthoses: Other Brace Other Brace: post op shoe- not present during session Restrictions Weight Bearing Restrictions: Yes RLE Weight Bearing: Non weight bearing Other Position/Activity Restrictions: don darco shoe     Mobility  Bed Mobility Overal bed mobility: Needs Assistance Bed Mobility: Supine to Sit Rolling: Supervision   Supine to sit: Supervision     General bed mobility comments: Pt didnt need assist to come to EOB.    Transfers Overall transfer level: Needs assistance   Transfers: Bed to chair/wheelchair/BSC            Lateral/Scoot Transfers: Min assist, Min guard General transfer comment: lateral scoot transfer from bed to recliner. Pt can perform scoot transfer with ability to scoot without physical assist but pt needs min to mod cues for NWB right LE and even min assist to hold it off floor at times.    Ambulation/Gait                   Stairs             Wheelchair Mobility    Modified Rankin (Stroke Patients Only)       Balance Overall balance assessment: Needs assistance Sitting-balance support: No upper extremity supported, Feet supported Sitting balance-[name] Scale: Good Sitting balance - Comments: can sit without support, but flexed posture  Cognition Arousal/Alertness: Awake/alert Behavior During Therapy: Flat affect Overall Cognitive Status: No family/caregiver present to determine baseline cognitive functioning Area of Impairment: Attention, Problem solving, Memory                 Orientation Level: Disoriented to,  Time Current Attention Level: Sustained Memory: Decreased recall of precautions Following Commands: Follows one step commands with increased time Safety/Judgement: Decreased awareness of safety Awareness: Emergent Problem Solving: Slow processing General Comments: required frequent cues for NWB with poor carryover.        Exercises General Exercises - Lower Extremity Ankle Circles/Pumps: AROM, Both, 10 reps Quad Sets: AROM, Both, 10 reps, Supine Long Arc Quad: AROM, Both, Seated, 20 reps Hip Flexion/Marching: AROM, Both, 20 reps, Seated Other Exercises Other Exercises: chair push ups requested of pt however pt couldnt lift buttocks off of chair. Not sure if this was due to lack of effort.    General Comments        Pertinent Vitals/Pain Pain Assessment Pain Assessment: Faces Faces Pain Scale: Hurts even more Pain Location: back Pain Descriptors / Indicators: Grimacing, Guarding Pain Intervention(s): Limited activity within patient's tolerance, Monitored during session, Repositioned    Home Living                          Prior Function            PT Goals (current goals can now be found in the care plan section) Acute Rehab PT Goals Patient Stated Goal: home PT Goal Formulation: With patient Time For Goal Achievement: 01/21/23 Potential to Achieve Goals: Fair Progress towards PT goals: Progressing toward goals    Frequency    Min 2X/week      PT Plan Current plan remains appropriate    Co-evaluation              AM-PAC PT "6 Clicks" Mobility   Outcome Measure  Help needed turning from your back to your side while in a flat bed without using bedrails?: None Help needed moving from lying on your back to sitting on the side of a flat bed without using bedrails?: None Help needed moving to and from a bed to a chair (including a wheelchair)?: None Help needed standing up from a chair using your arms (e.g., wheelchair or bedside chair)?:  Total Help needed to walk in hospital room?: Total Help needed climbing 3-5 steps with a railing? : Total 6 Click Score: 15    End of Session Equipment Utilized During Treatment: Gait belt Activity Tolerance: Patient limited by fatigue Patient left: with call bell/phone within reach;in chair;with chair alarm set Nurse Communication: Mobility status PT Visit Diagnosis: Muscle weakness (generalized) (M62.81);Pain;Other abnormalities of gait and mobility (R26.89) Pain - Right/Left: Right Pain - part of body:  (back)     Time: 6553-7482 PT Time Calculation (min) (ACUTE ONLY): 23 min  Charges:  $Therapeutic Exercise: 8-22 mins $Therapeutic Activity: 8-22 mins                     [facility] M,PT Acute Rehab Services [place] 01/07/2023, 10:23 AM

## 2023-01-07 NOTE — Progress Notes (Signed)
PROGRESS NOTE        PATIENT DETAILS Name: Hunter Reyes Age: 47 y.o. Sex: male Date of Birth: 09/30/1976 Admit Date: 12/02/2022 Admitting Physician [name], MD ZOX:[id number], Edwinna Areola, MD  Brief Summary: Patient is a 47 y.o.  male with history of bipolar disorder, crack cocaine use, peripheral neuropathy-who was transferred from [facility] for AKI/urinary retention-upon further evaluation-he was found to have MRSA bacteremia with right foot abscess requiring TMA, lumbar discitis/abscess-requiring emergent laminectomy.  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  Significant events: 12/10>> admit to [facility] at [facility]-AKI-urinary retention.  Plans to transfer to [facility]. 12/13>> laminectomy/decompression of ventral lumbar epidural abscess 12/17>> transmetatarsal amputation of right foot 12/21>>voiding trial-foley removed 12/22>>worsening renal function-urinary retention-foley replaced 12/25>>Urine protein creatinine ratio of 7.68-w hematuria-suggestion of new AKI is 2/2 infection GN 12/26>>Temp HD cath placed by IR-HD started 1/2 >> Temp HD cath removed 1/12>>Wound vac removed  Significant studies: 12/11>> CT renal stone study: Possible discitis L2-L3/L3-L4, moderate bilateral hydronephrosis/hydroureter 12/13>> MRI L-spine: Discitis/osteomyelitis at L3-L4 with 9 mm thick ventral canal phlegmon/abscess which extends from L3 to L4-L5.  Multiple paraspinal abscesses within the psoas musculature. 12/13>> echo: EF 60-65%, no evidence of valvular vegetations. 12/14>> MRI left foot: No osteomyelitis/abscess 12/15>> MRI right foot: Abscess with osteomyelitis of second/third metatarsals. 12/25>>Urine protein creatinine ratio of 7.68 12/25>> ASO titer: Negative 12/25>> ANA: Negative 12/25>> ANCA titers: Negative 12/25>> GBM antibody: Negative 12/25>> C3/C4: Normal limits. 12/25>> HBV/HCV serology:  1/8 >>MRI - 1.  Interval decrease in size of the epidural abscess at the L4 level, now measuring up to 2.4 x 0.9 cm, previously 2.7 x 0.9 cm. The degree of spinal canal stenosis at the L3-L4 level is also improved, now moderate, previously severe. 2. 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 3. Heterogeneous appearance of the right kidney with interval decrease in size of previously seen T2 hyperintense structures, favored to represent evolving and resolving infection 1/10-11 >> Renal function elevated 1/12 >> Renal function minimally recovering  Significant microbiology data: 12/10>> COVID/influenza PCR: Negative 12/11>> blood culture: MRSA 12/13>> blood culture: MRSA 12/13>> epidural abscess culture: MRSA 12/14>> blood culture: No growth  Procedures: 12/13>> right L4 laminectomy-sublaminar decompression of ventral epidural abscess. 12/17>> transmetatarsal amputation of right foot 12/26>>Right IJ Trialysis CVC placed by IR  Consults: ID Neurosurgery IR Nephrology  Subjective: No acute issues or events overnight, denies nausea vomiting headache fevers chills or chest pain  Objective:  Vitals: Blood pressure 118/71, pulse 67, temperature 98.1 F (36.7 C), temperature source Oral, resp. rate 18, height 6\' 2"  (1.88 m), weight 134.1 kg, SpO2 100 %.   Exam:  Awake Alert, No new F.N deficits, Normal affect [place].AT,PERRAL Supple Neck, No JVD,   Symmetrical Chest wall movement, Good air movement bilaterally, CTAB RRR,No Gallops, Rubs or new Murmurs,  +ve B.Sounds, Abd Soft, No tenderness,   Right transmetatarsal partial amputation with wound bandage clean dry intact   Assessment/Plan:  MRSA bacteremia, L3-L4 discitis with epidural abscess-s/p laminectomy/decompression on 12/05/22 - Right foot  osteomyelitis/abscess-s/p transmetatarsal amputation on 12/09/22  -Overall improved-repeat cultures negative so far, Orthopedics/neurosurgery following-able to lift both lower extremities off the bed. Continue to work with PT/OT. -Wound  vac removed 1/12 per Ortho - appreciate insight/recommendations. - ID recommending 8 weeks of IV Daptomycin -tentative end date 02/03/2023 -Repeat MRI L spine on 01/02/23- repeat MRI was done on 12/31/2022, results suggest improving infection.  ID recommending repeat MRI prior to completion of antibiotics -Given history of drug use-unfortunately will remain inpatient to complete IV antibiotic duration.  Not a candidate for home therapies.  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  -Urine output continues to be appropriate -Creatinine now downtrending with supportive care - nephrology signing off -Completed intermittent hemodialysis, temporary catheter removed 12/25/22   Hypokalemia, hypomagnesemia: Replete/recheck as appropriate.  Normocytic anemia  - S/P 1 unit of packed RBC on 12/17/2022,  -Slowly downtrending in the setting of iron deficiency versus chronic anemia with recurrent phlebotomy -Continue B12 replacement, iron replacement, folic acid replacement -Change labs to every 48 hours to decrease phlebotomy burden  Peripheral neuropathy - No longer on Neurontin given worsening AKI. Bipolar 1 disorder - Stable, Continue zoloft Polysubstance abuse - History of cocaine/IV use, continue home Suboxone dosing Dental caries -Per imaging, recommend outpatient dentistry follow-up Morbid Obesity: Estimated body mass index is 37.96 kg/m as calculated from the following:   Height as of this encounter: 6\' 2"  (1.88 m).   Weight as of this encounter: 134.1 kg.   Code status:   Code Status: Full Code   DVT  Prophylaxis: heparin injection 5,000 Units Start: 12/16/22 1000 SCD's Start: 12/09/22 1137   Family Communication: None at bedside  Disposition Plan: Status is: Inpatient Remains inpatient appropriate because: Severity of illness and ongoing need for IV antibiotics through 02/03/2023   Planned Discharge Destination:Home ultimately-but will need to remain inpatient to complete IV daptomycin-end date 02/03/23   Diet: Diet Order             Diet regular Room service appropriate? Yes; Fluid consistency: Thin  Diet effective now                    MEDICATIONS: Scheduled Meds:  buprenorphine-naloxone  1 tablet Sublingual BID   vitamin B-12  1,000 mcg Oral Daily   docusate sodium  200 mg Oral BID   ferrous sulfate  325 mg Oral BID WC   folic acid  1 mg Oral Daily   heparin injection (subcutaneous)  5,000 Units Subcutaneous Q8H   magnesium oxide  400 mg Oral BID   nutrition supplement (JUVEN)  1 packet Oral BID BM   pantoprazole  40 mg Oral Daily   polyethylene glycol  17 g Oral Daily   sertraline  25 mg Oral Daily   tamsulosin  0.4 mg Oral QPC supper   thiamine  500 mg Oral Daily   Continuous Infusions:  DAPTOmycin (CUBICIN) 850 mg in sodium chloride 0.9 % IVPB 850 mg (01/06/23 0051)   PRN Meds:.alum & mag hydroxide-simeth, bisacodyl, diphenhydrAMINE, guaiFENesin-dextromethorphan, ondansetron **OR** ondansetron (ZOFRAN) IV, oxyCODONE, phenol, sodium chloride, tiZANidine, traZODone   I have personally reviewed following labs and imaging studies  LABORATORY DATA:  Recent Labs  Lab 01/01/23 0316 01/02/23 0223 01/03/23 0522 01/05/23 0425 01/07/23 0533  WBC 15.2* 13.2* 11.0* 9.6 8.5  HGB 8.1* 7.8* 7.7* 7.3* 7.4*  HCT 25.7* 24.3* 23.8* 22.9* 23.6*  PLT 286 278 261 232 235  MCV 89.5 89.0 88.5 88.4 89.7  MCH 28.2 28.6 28.6 28.2 28.1  MCHC 31.5 32.1 32.4 31.9 31.4  RDW 14.6  14.4 14.3 14.3 14.6  LYMPHSABS 2.0 2.4  --   --   --   MONOABS 2.2* 1.7*  --   --   --    EOSABS 0.6* 0.7*  --   --   --   BASOSABS 0.1 0.1  --   --   --      Recent Labs  Lab 01/03/23 0522 01/04/23 0536 01/05/23 0421 01/05/23 0425 01/06/23 0253 01/07/23 0533  NA 140 139  --  138 140 140  K 3.7 3.4*  --  3.2* 3.2* 4.1  CL 105 105  --  102 105 106  CO2 23 20*  --  23 23 22   ANIONGAP 12 14  --  13 12 12   GLUCOSE 85 87  --  95 90 89  BUN 41* 37*  --  34* 35* 38*  CREATININE 5.27* 4.84*  --  4.72* 4.59* 4.43*  ALBUMIN  --  2.2*  --   --  2.3*  --   MG  --   --  1.4*  --   --  1.7  CALCIUM 8.4* 8.3*  --  8.1* 8.6* 8.6*     RADIOLOGY STUDIES/RESULTS: No results found.   LOS: 35 days   [place] on 01/07/2023 at 7:39 AM   -  To page go to [email or web address]

## 2023-01-07 NOTE — Progress Notes (Signed)
Mobility Specialist Progress Note:   01/07/23 1017  Mobility  Activity Transferred from chair to bed  Level of Assistance Standby assist, set-up cues, supervision of patient - no hands on  Assistive Device None  RLE Weight Bearing NWB  Activity Response Tolerated well  Mobility Referral Yes  $Mobility charge 1 Mobility   Pt received in chair and requesting assistance to transfer back to bed for BM. Pt was standby for physical assistance but required constant verbal reminders to keep NWB status on RLE. No complaints of pain or SOB. Pt left in bed with all needs met, call bell in reach, and NT in room.   Andrey Campanile Mobility Specialist Please contact via SecureChat or  Rehab office at [phone]

## 2023-01-07 NOTE — Progress Notes (Signed)
Patient ID: Hunter Reyes, male   DOB: 07/30/1976, 47 y.o.   MRN: [id number] S: Feels well, no new complaints O:BP 118/71 (BP Location: Right Arm)   Pulse 67   Temp 98.1 F (36.7 C) (Oral)   Resp 18   Ht 6\' 2"  (1.88 m)   Wt 134.1 kg   SpO2 100%   BMI 37.96 kg/m   Intake/Output Summary (Last 24 hours) at 01/07/2023 1127 Last data filed at 01/07/2023 0551 Gross per 24 hour  Intake 480 ml  Output 2350 ml  Net -1870 ml   Intake/Output: I/O last 3 completed shifts: In: 27 [P.O.:720; Other:100] Out: 5750 [Urine:5750]  Intake/Output this shift:  No intake/output data recorded. Weight change:  Gen: NAD CVS: RRR Resp:CTA Abd: +BS, soft, NT/ND Ext: no edema  Recent Labs  Lab 01/01/23 0316 01/02/23 0223 01/03/23 0522 01/04/23 0536 01/05/23 0425 01/06/23 0253 01/07/23 0533  NA 139 138 140 139 138 140 140  K 3.3* 3.6 3.7 3.4* 3.2* 3.2* 4.1  CL 102 104 105 105 102 105 106  CO2 22 21* 23 20* 23 23 22   GLUCOSE 89 83 85 87 95 90 89  BUN 43* 43* 41* 37* 34* 35* 38*  CREATININE 5.00* 5.18* 5.27* 4.84* 4.72* 4.59* 4.43*  ALBUMIN  --   --   --  2.2*  --  2.3*  --   CALCIUM 8.2* 8.3* 8.4* 8.3* 8.1* 8.6* 8.6*  PHOS  --   --   --  5.8*  --  5.9*  --    Liver Function Tests: Recent Labs  Lab 01/04/23 0536 01/06/23 0253  ALBUMIN 2.2* 2.3*   No results for input(s): "LIPASE", "AMYLASE" in the last 168 hours. No results for input(s): "AMMONIA" in the last 168 hours. CBC: Recent Labs  Lab 01/01/23 0316 01/02/23 0223 01/03/23 0522 01/05/23 0425 01/07/23 0533  WBC 15.2* 13.2* 11.0* 9.6 8.5  NEUTROABS 10.1* 8.3*  --   --   --   HGB 8.1* 7.8* 7.7* 7.3* 7.4*  HCT 25.7* 24.3* 23.8* 22.9* 23.6*  MCV 89.5 89.0 88.5 88.4 89.7  PLT 286 278 261 232 235   Cardiac Enzymes: Recent Labs  Lab 01/02/23 0223 01/04/23 0536 01/07/23 0533  CKTOTAL 933* 432* 146   CBG: No results for input(s): "GLUCAP" in the last 168 hours.  Iron Studies: No results for input(s): "IRON",  "TIBC", "TRANSFERRIN", "FERRITIN" in the last 72 hours. Studies/Results: No results found.  buprenorphine-naloxone  1 tablet Sublingual BID   vitamin B-12  1,000 mcg Oral Daily   docusate sodium  200 mg Oral BID   ferrous sulfate  325 mg Oral BID WC   folic acid  1 mg Oral Daily   heparin injection (subcutaneous)  5,000 Units Subcutaneous Q8H   magnesium oxide  400 mg Oral BID   nutrition supplement (JUVEN)  1 packet Oral BID BM   pantoprazole  40 mg Oral Daily   polyethylene glycol  17 g Oral Daily   sertraline  25 mg Oral Daily   tamsulosin  0.4 mg Oral QPC supper   thiamine  500 mg Oral Daily    BMET    Component Value Date/Time   NA 140 01/07/2023 0533   K 4.1 01/07/2023 0533   CL 106 01/07/2023 0533   CO2 22 01/07/2023 0533   GLUCOSE 89 01/07/2023 0533   BUN 38 (H) 01/07/2023 0533   CREATININE 4.43 (H) 01/07/2023 0533   CREATININE 0.97 02/06/2016 1458   CALCIUM  8.6 (L) 01/07/2023 0533   GFRNONAA 16 (L) 01/07/2023 0533   GFRAA >60 09/28/2017 1344   CBC    Component Value Date/Time   WBC 8.5 01/07/2023 0533   RBC 2.63 (L) 01/07/2023 0533   HGB 7.4 (L) 01/07/2023 0533   HCT 23.6 (L) 01/07/2023 0533   PLT 235 01/07/2023 0533   MCV 89.7 01/07/2023 0533   MCH 28.1 01/07/2023 0533   MCHC 31.4 01/07/2023 0533   RDW 14.6 01/07/2023 0533   LYMPHSABS 2.4 01/02/2023 0223   MONOABS 1.7 (H) 01/02/2023 0223   EOSABS 0.7 (H) 01/02/2023 0223   BASOSABS 0.1 01/02/2023 0223     Assessment/Plan:  Acute kidney Injury/CKD (unknown baseline Scr as none documented since 2018): Initial injury suspected to be from obstructive uropathy for which he is status post Foley catheter placement.  Differential of infection associated GN entertained but complement levels normal.  Nephrology was consulted for recurrent worsening of renal function suspected to be ATN (poorly identified insult but possibly mild rhabdo contributing) after earlier suffering dialysis dependence acute kidney injury.   He currently has robust urine output with gradual improvement of renal function seen on labs even off of IVF's.  Nothing further to add will sign off.  Please call with any questions or concerns.   Hypokalemia/hypomagnesemia: Secondary to limited oral intake, oral replacement has been ordered with additional magnesium replacement.  Disseminated MRSA infection: Status post right transmetatarsal amputation with wound VAC in place.  Ongoing antimicrobial therapy with daptomycin with improvement of leukocytosis noted. Anemia: Secondary to acute/critical illness and compounded by recent right TMA.  Monitor for PRBC needs.  No overt loss.  Donetta Potts, MD [facility]

## 2023-01-08 DIAGNOSIS — B9562 Methicillin resistant Staphylococcus aureus infection as the cause of diseases classified elsewhere: Secondary | ICD-10-CM | POA: Diagnosis not present

## 2023-01-08 DIAGNOSIS — R7881 Bacteremia: Secondary | ICD-10-CM | POA: Diagnosis not present

## 2023-01-08 NOTE — Progress Notes (Signed)
Occupational Therapy Treatment Patient Details Name: Hunter Reyes MRN: [id number] DOB: Aug 15, 1976 Today's Date: 01/08/2023   History of present illness 47 y.o. male admitted 12/10 with AMS, urinary retention and low back pain. Underwent L4 lumbar laminectomy for epidural abscess 12/13. s/p R foot transmet amp 12/17. Pt initiated HD on 12/26. PMH:  bipolar 1 disorder, borderline personality disorder, COPD, crack cocaine abuse, depression, peripheral neuropathy, history of right foot osteomyelitis, panic attack and history of seizures   OT comments  Patient with limited progress this treatment session due to pain. Patient attempted to get to EOB to address grooming and stated he had pain at left thigh and increased back pain. Nursing informed and pain meds given. Patient performed grooming in bed and began to perform UE HEP with red therapy band before asking to use bed pan. Patient encouraged to use drop arm BSC but patient declined due to pain. Patient was max assist for toilet hygiene. Patient would benefit from continued OT to address functional transfers, education on NWB, and self care. Acute OT to continue to follow.    Recommendations for follow up therapy are one component of a multi-disciplinary discharge planning process, led by the attending physician.  Recommendations may be updated based on patient status, additional functional criteria and insurance authorization.    Follow Up Recommendations  Skilled nursing-short term rehab (<3 hours/day)     Assistance Recommended at Discharge Intermittent Supervision/Assistance  Patient can return home with the following  A lot of help with bathing/dressing/bathroom;Assistance with cooking/housework;Help with stairs or ramp for entrance;Assist for transportation;A lot of help with walking and/or transfers   Equipment Recommendations  Wheelchair (measurements OT);Wheelchair cushion (measurements OT);Other (comment)    Recommendations for  Other Services      Precautions / Restrictions Precautions Precautions: Fall;Back Precaution Booklet Issued: No Precaution Comments: watch HR, incontinent stool Required Braces or Orthoses: Other Brace Other Brace: post op shoe- not present during session Restrictions Weight Bearing Restrictions: Yes RLE Weight Bearing: Non weight bearing Other Position/Activity Restrictions: don darco shoe       Mobility Bed Mobility Overal bed mobility: Needs Assistance Bed Mobility: Supine to Sit, Sit to Supine, Rolling Rolling: Supervision   Supine to sit: Supervision Sit to supine: Supervision   General bed mobility comments: patient able to get to EOB and back to supine without assistance    Transfers Overall transfer level: Needs assistance                 General transfer comment: declined transfer due to pain     Balance Overall balance assessment: Needs assistance Sitting-balance support: No upper extremity supported, Feet supported Sitting balance-[name] Scale: Good Sitting balance - Comments: limited sitting tolerance due to patient's complaints of pain                                   ADL either performed or assessed with clinical judgement   ADL Overall ADL's : Needs assistance/impaired     Grooming: Wash/dry hands;Wash/dry face;Oral care;Sitting Grooming Details (indicate cue type and reason): attempted to perform seated on EOB but patient stated increased back pain                   Toilet Transfer Details (indicate cue type and reason): bed pan placed while in bed Toileting- Clothing Manipulation and Hygiene: Maximal assistance;Bed level Toileting - Clothing Manipulation Details (indicate cue type and reason): max  assist for toilet hygiene while sidelying       General ADL Comments: patient attempted sitting on EOB to perform grooming tasks but stated he had left thigh pain and back pain. Patient declined using drop arm BSC due to  pain    Extremity/Trunk Assessment              Vision       Perception     Praxis      Cognition Arousal/Alertness: Awake/alert Behavior During Therapy: Flat affect Overall Cognitive Status: No family/caregiver present to determine baseline cognitive functioning Area of Impairment: Attention, Problem solving, Memory                 Orientation Level: Disoriented to, Time Current Attention Level: Sustained Memory: Decreased recall of precautions Following Commands: Follows one step commands with increased time Safety/Judgement: Decreased awareness of safety Awareness: Emergent Problem Solving: Slow processing          Exercises Exercises: General Upper Extremity General Exercises - Upper Extremity Shoulder Flexion: Strengthening, Both, 10 reps, Theraband Theraband Level (Shoulder Flexion): Level 2 (Red) Shoulder ABduction: Strengthening, Both, 10 reps, Theraband Theraband Level (Shoulder Abduction): Level 2 (Red)    Shoulder Instructions       General Comments      Pertinent Vitals/ Pain       Pain Assessment Pain Assessment: Faces Faces Pain Scale: Hurts even more Pain Location: back and left thigh Pain Descriptors / Indicators: Burning, Aching, Grimacing, Guarding Pain Intervention(s): Limited activity within patient's tolerance, Monitored during session, Patient requesting pain meds-RN notified, RN gave pain meds during session  Home Living                                          Prior Functioning/Environment              Frequency  Min 2X/week        Progress Toward Goals  OT Goals(current goals can now be found in the care plan section)  Progress towards OT goals: Progressing toward goals  Acute Rehab OT Goals Patient Stated Goal: get better OT Goal Formulation: With patient Time For Goal Achievement: 01/09/23 Potential to Achieve Goals: Fair ADL Goals Pt Will Perform Grooming: sitting;with set-up Pt  Will Perform Lower Body Dressing: sitting/lateral leans;with supervision Pt Will Transfer to Toilet: with supervision;with transfer board;bedside commode Pt Will Perform Tub/Shower Transfer: Tub transfer;Stand pivot transfer;with min assist;tub bench Additional ADL Goal #1: Patient will maintain NWB on RLE and spinal precautions throughout ADLs.  Plan Discharge plan remains appropriate    Co-evaluation                 AM-PAC OT "6 Clicks" Daily Activity     Outcome Measure   Help from another person eating meals?: None Help from another person taking care of personal grooming?: A Little Help from another person toileting, which includes using toliet, bedpan, or urinal?: Total Help from another person bathing (including washing, rinsing, drying)?: A Lot Help from another person to put on and taking off regular upper body clothing?: A Little Help from another person to put on and taking off regular lower body clothing?: A Lot 6 Click Score: 15    End of Session    OT Visit Diagnosis: Unsteadiness on feet (R26.81);Muscle weakness (generalized) (M62.81);Pain;Other symptoms and signs involving cognitive function;Other abnormalities of gait and mobility (R26.89) Pain -  Right/Left: Left Pain - part of body: Leg   Activity Tolerance Patient tolerated treatment well;Patient limited by pain   Patient Left in bed;with call bell/phone within reach;with bed alarm set   Nurse Communication Mobility status        Time: 4035-2481 OT Time Calculation (min): 42 min  Charges: OT General Charges $OT Visit: 1 Visit OT Treatments $Self Care/Home Management : 23-37 mins $Therapeutic Exercise: 8-22 mins  Lodema Hong, OTA Acute Rehabilitation Services  Office [phone]   Trixie Dredge 01/08/2023, 2:24 PM

## 2023-01-08 NOTE — Progress Notes (Signed)
PROGRESS NOTE        PATIENT DETAILS Name: Hunter Reyes Age: 47 y.o. Sex: male Date of Birth: [birth year] Admit Date: 12/02/2022 Admitting Physician [name], MD [id number], Edwinna Areola, MD  Brief Summary: Patient is a 47 y.o.  male with history of bipolar disorder, crack cocaine use, peripheral neuropathy-who was transferred from [facility] [place] for AKI/urinary retention-upon further evaluation-he was found to have MRSA bacteremia with right foot abscess requiring TMA, lumbar discitis/abscess-requiring emergent laminectomy.  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  Significant events: 12/10>> admit to [facility] at [facility]-AKI-urinary retention.  Plans to transfer to [facility]. 12/13>> laminectomy/decompression of ventral lumbar epidural abscess 12/17>> transmetatarsal amputation of right foot 12/21>>voiding trial-foley removed 12/22>>worsening renal function-urinary retention-foley replaced 12/25>>Urine protein creatinine ratio of 7.68-w hematuria-suggestion of new AKI is 2/2 infection GN 12/26>>Temp HD cath placed by IR-HD started 1/2 >> Temp HD cath removed 1/12>>Wound vac removed  Significant studies: 12/11>> CT renal stone study: Possible discitis L2-L3/L3-L4, moderate bilateral hydronephrosis/hydroureter 12/13>> MRI L-spine: Discitis/osteomyelitis at L3-L4 with 9 mm thick ventral canal phlegmon/abscess which extends from L3 to L4-L5.  Multiple paraspinal abscesses within the psoas musculature. 12/13>> echo: EF 60-65%, no evidence of valvular vegetations. 12/14>> MRI left foot: No osteomyelitis/abscess 12/15>> MRI right foot: Abscess with osteomyelitis of second/third metatarsals. 12/25>>Urine protein creatinine ratio of 7.68 12/25>> ASO titer: Negative 12/25>> ANA: Negative 12/25>> ANCA titers: Negative 12/25>> GBM antibody: Negative 12/25>> C3/C4: Normal limits. 12/25>> HBV/HCV serology:  1/8 >>MRI - 1.  Interval decrease in size of the epidural abscess at the L4 level, now measuring up to 2.4 x 0.9 cm, previously 2.7 x 0.9 cm. The degree of spinal canal stenosis at the L3-L4 level is also improved, now moderate, previously severe. 2. 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 3. Heterogeneous appearance of the right kidney with interval decrease in size of previously seen T2 hyperintense structures, favored to represent evolving and resolving infection 1/10-11 >> Renal function elevated 1/12 >> Renal function minimally recovering  Significant microbiology data: 12/10>> COVID/influenza PCR: Negative 12/11>> blood culture: MRSA 12/13>> blood culture: MRSA 12/13>> epidural abscess culture: MRSA 12/14>> blood culture: No growth  Procedures: 12/13>> right L4 laminectomy-sublaminar decompression of ventral epidural abscess. 12/17>> transmetatarsal amputation of right foot 12/26>>Right IJ Trialysis CVC placed by IR  Consults: ID Neurosurgery IR Nephrology  Subjective: No acute issues or events overnight, denies nausea vomiting headache fevers chills or chest pain  Objective:  Vitals: Blood pressure 120/74, pulse 83, temperature 98.1 F (36.7 C), temperature source Oral, resp. rate 18, height 6\' 2"  (1.88 m), weight 134.1 kg, SpO2 95 %.   Exam:  Awake Alert, No new F.N deficits, Normal affect [place].AT,PERRAL Supple Neck, No JVD,   Symmetrical Chest wall movement, Good air movement bilaterally, CTAB RRR,No Gallops, Rubs or new Murmurs,  +ve B.Sounds, Abd Soft, No tenderness,   Right transmetatarsal partial amputation with wound bandage clean dry intact   Assessment/Plan:  MRSA bacteremia, L3-L4 discitis with epidural abscess-s/p laminectomy/decompression on 12/05/22 - Right foot  osteomyelitis/abscess-s/p transmetatarsal amputation on 12/09/22  -Overall improved-repeat cultures negative so far, Orthopedics/neurosurgery following-able to lift both lower extremities off the bed. Continue to work with PT/OT. -Wound  vac removed 1/12 per Ortho - appreciate insight/recommendations. - ID recommending 8 weeks of IV Daptomycin -tentative end date 02/03/2023 -Repeat MRI L spine on 01/02/23- repeat MRI was done on 12/31/2022, results suggest improving infection.  ID recommending repeat MRI prior to completion of antibiotics -Given history of drug use-unfortunately will remain inpatient to complete IV antibiotic duration.  Not a candidate for home therapies.  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  -Urine output continues to be appropriate -Creatinine now downtrending with supportive care - nephrology signing off -Completed intermittent hemodialysis, temporary catheter removed 12/25/22   Hypokalemia, hypomagnesemia: Replete/recheck as appropriate.  Normocytic anemia  - S/P 1 unit of packed RBC on 12/17/2022,  -Slowly downtrending in the setting of iron deficiency versus chronic anemia with recurrent phlebotomy -Continue B12 replacement, iron replacement, folic acid replacement -Change labs to every 48 hours to decrease phlebotomy burden  Peripheral neuropathy - No longer on Neurontin given worsening AKI. Bipolar 1 disorder - Stable, Continue zoloft Polysubstance abuse - History of cocaine/IV use, continue home Suboxone dosing Dental caries -Per imaging, recommend outpatient dentistry follow-up Morbid Obesity: Estimated body mass index is 37.96 kg/m as calculated from the following:   Height as of this encounter: 6\' 2"  (1.88 m).   Weight as of this encounter: 134.1 kg.   Code status:   Code Status: Full Code   DVT  Prophylaxis: heparin injection 5,000 Units Start: 12/16/22 1000 SCD's Start: 12/09/22 1137   Family Communication: None at bedside  Disposition Plan: Status is: Inpatient Remains inpatient appropriate because: Severity of illness and ongoing need for IV antibiotics through 02/03/2023   Planned Discharge Destination:Home ultimately-but will need to remain inpatient to complete IV daptomycin-end date 02/03/23   Diet: Diet Order             Diet regular Room service appropriate? Yes; Fluid consistency: Thin  Diet effective now                    MEDICATIONS: Scheduled Meds:  buprenorphine-naloxone  1 tablet Sublingual BID   vitamin B-12  1,000 mcg Oral Daily   docusate sodium  200 mg Oral BID   ferrous sulfate  325 mg Oral BID WC   folic acid  1 mg Oral Daily   heparin injection (subcutaneous)  5,000 Units Subcutaneous Q8H   magnesium oxide  400 mg Oral BID   nutrition supplement (JUVEN)  1 packet Oral BID BM   pantoprazole  40 mg Oral Daily   polyethylene glycol  17 g Oral Daily   sertraline  25 mg Oral Daily   tamsulosin  0.4 mg Oral QPC supper   thiamine  500 mg Oral Daily   Continuous Infusions:  DAPTOmycin (CUBICIN) 850 mg in sodium chloride 0.9 % IVPB 850 mg (01/07/23 2328)   PRN Meds:.alum & mag hydroxide-simeth, bisacodyl, diphenhydrAMINE, guaiFENesin-dextromethorphan, ondansetron **OR** ondansetron (ZOFRAN) IV, oxyCODONE, phenol, sodium chloride, tiZANidine, traZODone   I have personally reviewed following labs and imaging studies  LABORATORY DATA:  Recent Labs  Lab 01/02/23 0223 01/03/23 0522 01/05/23 0425 01/07/23 0533  WBC 13.2* 11.0* 9.6 8.5  HGB 7.8* 7.7* 7.3* 7.4*  HCT 24.3* 23.8* 22.9* 23.6*  PLT 278 261 232 235  MCV 89.0 88.5 88.4 89.7  MCH 28.6 28.6 28.2 28.1  MCHC 32.1 32.4 31.9 31.4  RDW 14.4 14.3 14.3 14.6  LYMPHSABS 2.4  --   --   --  MONOABS 1.7*  --   --   --   EOSABS 0.7*  --   --   --   BASOSABS 0.1  --   --   --       Recent Labs  Lab 01/03/23 0522 01/04/23 0536 01/05/23 0421 01/05/23 0425 01/06/23 0253 01/07/23 0533  NA 140 139  --  138 140 140  K 3.7 3.4*  --  3.2* 3.2* 4.1  CL 105 105  --  102 105 106  CO2 23 20*  --  23 23 22   ANIONGAP 12 14  --  13 12 12   GLUCOSE 85 87  --  95 90 89  BUN 41* 37*  --  34* 35* 38*  CREATININE 5.27* 4.84*  --  4.72* 4.59* 4.43*  ALBUMIN  --  2.2*  --   --  2.3*  --   MG  --   --  1.4*  --   --  1.7  CALCIUM 8.4* 8.3*  --  8.1* 8.6* 8.6*     RADIOLOGY STUDIES/RESULTS: No results found.   LOS: 36 days   [place] on 01/08/2023 at 7:21 AM   -  To page go to [email or web address]

## 2023-01-09 DIAGNOSIS — B9562 Methicillin resistant Staphylococcus aureus infection as the cause of diseases classified elsewhere: Secondary | ICD-10-CM | POA: Diagnosis not present

## 2023-01-09 DIAGNOSIS — R7881 Bacteremia: Secondary | ICD-10-CM | POA: Diagnosis not present

## 2023-01-09 LAB — BASIC METABOLIC PANEL
Anion gap: 11 (ref 5–15)
BUN: 41 mg/dL — ABNORMAL HIGH (ref 6–20)
CO2: 26 mmol/L (ref 22–32)
Calcium: 8.9 mg/dL (ref 8.9–10.3)
Chloride: 103 mmol/L (ref 98–111)
Creatinine, Ser: 4.26 mg/dL — ABNORMAL HIGH (ref 0.61–1.24)
GFR, Estimated: 17 mL/min — ABNORMAL LOW (ref >60)
Glucose, Bld: 96 mg/dL (ref 70–99)
Potassium: 3.5 mmol/L (ref 3.5–5.1)
Sodium: 140 mmol/L (ref 135–145)

## 2023-01-09 LAB — CBC
HCT: 23.2 % — ABNORMAL LOW (ref 39.0–52.0)
Hemoglobin: 7.7 g/dL — ABNORMAL LOW (ref 13.0–17.0)
MCH: 28.9 pg (ref 26.0–34.0)
MCHC: 33.2 g/dL (ref 30.0–36.0)
MCV: 87.2 fL (ref 80.0–100.0)
Platelets: 233 10*3/uL (ref 150–400)
RBC: 2.66 MIL/uL — ABNORMAL LOW (ref 4.22–5.81)
RDW: 14.6 % (ref 11.5–15.5)
WBC: 9.5 10*3/uL (ref 4.0–10.5)
nRBC: 0 % (ref 0.0–0.2)

## 2023-01-09 NOTE — Progress Notes (Signed)
PROGRESS NOTE        PATIENT DETAILS Name: Hunter Reyes Age: 47 y.o. Sex: male Date of Birth: [birth year] Admit Date: 12/02/2022 Admitting Physician [name], MD HFW:YOVZ, Edwinna Areola, MD  Brief Summary: Patient is a 47 y.o.  male with history of bipolar disorder, crack cocaine use, peripheral neuropathy-who was transferred from [facility] for AKI/urinary retention-upon further evaluation-he was found to have MRSA bacteremia with right foot abscess requiring TMA, lumbar discitis/abscess-requiring emergent laminectomy.  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  Significant events: 12/10>> admit to [facility] at [facility]-AKI-urinary retention.  Plans to transfer to [facility]. 12/13>> laminectomy/decompression of ventral lumbar epidural abscess 12/17>> transmetatarsal amputation of right foot 12/21>>voiding trial-foley removed 12/22>>worsening renal function-urinary retention-foley replaced 12/25>>Urine protein creatinine ratio of 7.68-w hematuria-suggestion of new AKI is 2/2 infection GN 12/26>>Temp HD cath placed by IR-HD started 1/2 >> Temp HD cath removed 1/12>>Wound vac removed 1/17>> Transient RUE proximal parasthesias, worse with certain movements. Likely impingement - discussed need for outpatient follow up  Significant studies: 12/11>> CT renal stone study: Possible discitis L2-L3/L3-L4, moderate bilateral hydronephrosis/hydroureter 12/13>> MRI L-spine: Discitis/osteomyelitis at L3-L4 with 9 mm thick ventral canal phlegmon/abscess which extends from L3 to L4-L5.  Multiple paraspinal abscesses within the psoas musculature. 12/13>> echo: EF 60-65%, no evidence of valvular vegetations. 12/14>> MRI left foot: No osteomyelitis/abscess 12/15>> MRI right foot: Abscess with osteomyelitis of second/third metatarsals. 12/25>>Urine protein creatinine ratio of 7.68 12/25>> ASO titer: Negative 12/25>> ANA: Negative 12/25>>  ANCA titers: Negative 12/25>> GBM antibody: Negative 12/25>> C3/C4: Normal limits. 12/25>> HBV/HCV serology:  1/8 >>MRI - 1. Interval decrease in size of the epidural abscess at the L4 level, now measuring up to 2.4 x 0.9 cm, previously 2.7 x 0.9 cm. The degree of spinal canal stenosis at the L3-L4 level is also improved, now moderate, previously severe. 2. 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 3. Heterogeneous appearance of the right kidney with interval decrease in size of previously seen T2 hyperintense structures, favored to represent evolving and resolving infection 1/10-11 >> Renal function elevated 1/12 >> Renal function minimally recovering  Significant microbiology data: 12/10>> COVID/influenza PCR: Negative 12/11>> blood culture: MRSA 12/13>> blood culture: MRSA 12/13>> epidural abscess culture: MRSA 12/14>> blood culture: No growth  Procedures: 12/13>> right L4 laminectomy-sublaminar decompression of ventral epidural abscess. 12/17>> transmetatarsal amputation of right foot 12/26>>Right IJ Trialysis CVC placed by IR  Consults: ID Neurosurgery IR Nephrology  Subjective: No acute issues or events overnight, denies nausea vomiting headache fevers chills or chest pain  Objective:  Vitals: Blood pressure 112/63, pulse 68, temperature 98 F (36.7 C), temperature source Oral, resp. rate 18, height 6\' 2"  (1.88 m), weight 134.1 kg, SpO2 96 %.   Exam:  Awake Alert, No new F.N deficits, Normal affect [place].AT,PERRAL Supple Neck, No JVD,   Symmetrical Chest wall movement, Good air movement bilaterally, CTAB RRR,No Gallops, Rubs or new Murmurs,  +ve B.Sounds, Abd Soft, No tenderness,   Right transmetatarsal partial amputation with wound bandage clean dry intact    Assessment/Plan:  MRSA bacteremia, L3-L4 discitis with epidural abscess-s/p laminectomy/decompression on 12/05/22 - Right foot osteomyelitis/abscess-s/p transmetatarsal amputation on 12/09/22  -Overall improved-repeat cultures negative  so far, Orthopedics/neurosurgery following-able to lift both lower extremities off the bed. Continue to work with PT/OT. -Wound vac removed 1/12 per Ortho - appreciate insight/recommendations. - ID recommending 8 weeks of IV Daptomycin -tentative end date 02/03/2023 -Repeat MRI L spine on 01/02/23- repeat MRI was done on 12/31/2022, results suggest improving infection.  ID recommending repeat MRI prior to completion of antibiotics -Given history of drug use-unfortunately will remain inpatient to complete IV antibiotic duration.  Not a candidate for home therapies.  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  -Urine output continues to be appropriate -Creatinine now downtrending with supportive care - nephrology signing off -Completed intermittent hemodialysis, temporary catheter removed 12/25/22   Hypokalemia, hypomagnesemia: Replete/recheck as appropriate.  Normocytic anemia  - S/P 1 unit of packed RBC on 12/17/2022,  -Slowly downtrending in the setting of iron deficiency versus chronic anemia with recurrent phlebotomy -Continue B12 replacement, iron replacement, folic acid replacement -Change labs to every 48 hours to decrease phlebotomy burden  Peripheral neuropathy - No longer on Neurontin given worsening AKI. Bipolar 1 disorder - Stable, Continue zoloft Polysubstance abuse - History of cocaine/IV use, continue home Suboxone dosing Dental caries -Per imaging, recommend outpatient dentistry follow-up Morbid Obesity: Estimated body mass index is 37.96 kg/m as calculated from the following:    Height as of this encounter: 6\' 2"  (1.88 m).   Weight as of this encounter: 134.1 kg.   Code status:   Code Status: Full Code   DVT Prophylaxis: heparin injection 5,000 Units Start: 12/16/22 1000 SCD's Start: 12/09/22 1137   Family Communication: None at bedside  Disposition Plan: Status is: Inpatient Remains inpatient appropriate because: Severity of illness and ongoing need for IV antibiotics through 02/03/2023   Planned Discharge Destination:Home ultimately-but will need to remain inpatient to complete IV daptomycin-end date 02/03/23   Diet: Diet Order             Diet regular Room service appropriate? Yes; Fluid consistency: Thin  Diet effective now                    MEDICATIONS: Scheduled Meds:  buprenorphine-naloxone  1 tablet Sublingual BID   vitamin B-12  1,000 mcg Oral Daily   docusate sodium  200 mg Oral BID   ferrous sulfate  325 mg Oral BID WC   folic acid  1 mg Oral Daily   heparin injection (subcutaneous)  5,000 Units Subcutaneous Q8H   magnesium oxide  400 mg Oral BID   nutrition supplement (JUVEN)  1 packet Oral BID BM   pantoprazole  40 mg Oral Daily   polyethylene glycol  17 g Oral Daily   sertraline  25 mg Oral Daily   tamsulosin  0.4 mg Oral QPC supper   thiamine  500 mg Oral Daily   Continuous Infusions:  DAPTOmycin (CUBICIN) 850 mg in sodium chloride 0.9 % IVPB 850 mg (01/07/23 2328)   PRN Meds:.alum & mag hydroxide-simeth, bisacodyl, diphenhydrAMINE, guaiFENesin-dextromethorphan, ondansetron **OR** ondansetron (ZOFRAN) IV, oxyCODONE, phenol, sodium chloride, tiZANidine, traZODone   I have personally reviewed following labs and imaging studies  LABORATORY DATA:  Recent Labs  Lab 01/03/23 0522 01/05/23 0425 01/07/23 0533  WBC 11.0* 9.6 8.5  HGB 7.7* 7.3* 7.4*  HCT 23.8* 22.9* 23.6*  PLT 261 232 235  MCV 88.5 88.4 89.7  MCH 28.6 28.2 28.1  MCHC 32.4 31.9 31.4  RDW 14.3  14.3 14.6     Recent Labs  Lab 01/03/23 0522  01/04/23 0536 01/05/23 0421 01/05/23 0425 01/06/23 0253 01/07/23 0533  NA 140 139  --  138 140 140  K 3.7 3.4*  --  3.2* 3.2* 4.1  CL 105 105  --  102 105 106  CO2 23 20*  --  23 23 22   ANIONGAP 12 14  --  13 12 12   GLUCOSE 85 87  --  95 90 89  BUN 41* 37*  --  34* 35* 38*  CREATININE 5.27* 4.84*  --  4.72* 4.59* 4.43*  ALBUMIN  --  2.2*  --   --  2.3*  --   MG  --   --  1.4*  --   --  1.7  CALCIUM 8.4* 8.3*  --  8.1* 8.6* 8.6*     RADIOLOGY STUDIES/RESULTS: No results found.   LOS: 37 days   [place] on 01/09/2023 at 7:15 AM   -  To page go to [email or web address]

## 2023-01-10 DIAGNOSIS — R7881 Bacteremia: Secondary | ICD-10-CM | POA: Diagnosis not present

## 2023-01-10 DIAGNOSIS — B9562 Methicillin resistant Staphylococcus aureus infection as the cause of diseases classified elsewhere: Secondary | ICD-10-CM | POA: Diagnosis not present

## 2023-01-10 NOTE — Progress Notes (Signed)
Physical Therapy Treatment Patient Details Name: Hunter Reyes MRN: [id number] DOB: 05-27-1976 Today's Date: 01/10/2023   History of Present Illness 47 y.o. male admitted 12/10 with AMS, urinary retention and low back pain. Underwent L4 lumbar laminectomy for epidural abscess 12/13. s/p R foot transmet amp 12/17. Pt initiated HD on 12/26. PMH:  bipolar 1 disorder, borderline personality disorder, COPD, crack cocaine abuse, depression, peripheral neuropathy, history of right foot osteomyelitis, panic attack and history of seizures    PT Comments    Pt admitted with above diagnosis. Pt was able to scoot to drop arm recliner with supervision and min cues.  Pt maintained NWB right LE today.  Pt also performed exercises as well.  Pt making progress. Did not want to stand due to back pain.   Pt currently with functional limitations due to balance and endurance deficits. Pt will benefit from skilled PT to increase their independence and safety with mobility to allow discharge to the venue listed below.      Recommendations for follow up therapy are one component of a multi-disciplinary discharge planning process, led by the attending physician.  Recommendations may be updated based on patient status, additional functional criteria and insurance authorization.  Follow Up Recommendations  Skilled nursing-short term rehab (<3 hours/day) Can patient physically be transported by private vehicle: No   Assistance Recommended at Discharge Frequent or constant Supervision/Assistance  Patient can return home with the following Assistance with cooking/housework;Assist for transportation;Help with stairs or ramp for entrance;A lot of help with walking and/or transfers;A little help with bathing/dressing/bathroom   Equipment Recommendations  Rolling walker (2 wheels);Wheelchair (measurements PT)    Recommendations for Other Services       Precautions / Restrictions Precautions Precautions:  Fall;Back Precaution Booklet Issued: No Precaution Comments: watch HR, incontinent stool Required Braces or Orthoses: Other Brace Other Brace: post op shoe- not present during session Restrictions Weight Bearing Restrictions: Yes RLE Weight Bearing: Non weight bearing     Mobility  Bed Mobility Overal bed mobility: Needs Assistance Bed Mobility: Supine to Sit, Sit to Supine, Rolling Rolling: Supervision Sidelying to sit: Supervision Supine to sit: Supervision Sit to supine: Supervision Sit to sidelying: Supervision General bed mobility comments: patient able to get to EOB and back to supine without assistance    Transfers Overall transfer level: Needs assistance Equipment used: None Transfers: Bed to chair/wheelchair/BSC            Lateral/Scoot Transfers: Min guard, Supervision General transfer comment: No assist given with cues only to maintain NWB right LE. Pt scooted to drop arm recliner.    Ambulation/Gait                   Stairs             Wheelchair Mobility    Modified Rankin (Stroke Patients Only)       Balance Overall balance assessment: Needs assistance Sitting-balance support: No upper extremity supported, Feet supported Sitting balance-[name] Scale: Good Sitting balance - Comments: limited sitting tolerance due to patient's complaints of pain                                    Cognition Arousal/Alertness: Awake/alert Behavior During Therapy: Flat affect Overall Cognitive Status: No family/caregiver present to determine baseline cognitive functioning Area of Impairment: Attention, Problem solving, Memory  Current Attention Level: Sustained Memory: Decreased recall of precautions Following Commands: Follows one step commands with increased time Safety/Judgement: Decreased awareness of safety Awareness: Emergent Problem Solving: Slow processing General Comments: Pt did better with  maintaining NWB with less cuing.        Exercises General Exercises - Lower Extremity Ankle Circles/Pumps: AROM, Both, 10 reps Quad Sets: AROM, Both, 10 reps, Supine Long Arc Quad: AROM, Both, Seated, 20 reps Heel Slides: AAROM, Right, Left, 5 reps Straight Leg Raises: AROM, Both, 10 reps, Seated Other Exercises Other Exercises: chair push ups requested of pt with pt lifting  buttocks off of chair minimally x 2. Pt does not appear to give alot of effort.    General Comments        Pertinent Vitals/Pain Pain Assessment Pain Assessment: Faces Faces Pain Scale: Hurts even more Pain Location: back and left thigh Pain Descriptors / Indicators: Burning, Aching, Grimacing, Guarding Pain Intervention(s): Limited activity within patient's tolerance, Monitored during session, Repositioned    Home Living                          Prior Function            PT Goals (current goals can now be found in the care plan section) Acute Rehab PT Goals Patient Stated Goal: home Progress towards PT goals: Progressing toward goals    Frequency    Min 2X/week      PT Plan Current plan remains appropriate    Co-evaluation              AM-PAC PT "6 Clicks" Mobility   Outcome Measure  Help needed turning from your back to your side while in a flat bed without using bedrails?: None Help needed moving from lying on your back to sitting on the side of a flat bed without using bedrails?: None Help needed moving to and from a bed to a chair (including a wheelchair)?: None Help needed standing up from a chair using your arms (e.g., wheelchair or bedside chair)?: Total Help needed to walk in hospital room?: Total Help needed climbing 3-5 steps with a railing? : Total 6 Click Score: 15    End of Session Equipment Utilized During Treatment: Gait belt Activity Tolerance: Patient limited by fatigue Patient left: with call bell/phone within reach;in chair;with chair alarm  set Nurse Communication: Mobility status PT Visit Diagnosis: Muscle weakness (generalized) (M62.81);Pain;Other abnormalities of gait and mobility (R26.89) Pain - Right/Left: Right Pain - part of body:  (back)     Time: 4627-0350 PT Time Calculation (min) (ACUTE ONLY): 23 min  Charges:  $Therapeutic Exercise: 8-22 mins $Therapeutic Activity: 8-22 mins                     [facility] M,PT Acute Rehab Services [place] 01/10/2023, 11:47 AM

## 2023-01-10 NOTE — Progress Notes (Signed)
PROGRESS NOTE        PATIENT DETAILS Name: Hunter Reyes Age: 47 y.o. Sex: male Date of Birth: 20-Jun-1976 Admit Date: 12/02/2022 Admitting Physician [name], MD JIR:CVEL, Edwinna Areola, MD  Brief Summary: Patient is a 47 y.o.  male with history of bipolar disorder, crack cocaine use, peripheral neuropathy-who was transferred from [facility] for AKI/urinary retention-upon further evaluation-he was found to have MRSA bacteremia with right foot abscess requiring TMA, lumbar discitis/abscess-requiring emergent laminectomy.  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  Significant events: 12/10>> admit to [facility] at [facility]-AKI-urinary retention.  Plans to transfer to [facility]. 12/13>> laminectomy/decompression of ventral lumbar epidural abscess 12/17>> transmetatarsal amputation of right foot 12/21>>voiding trial-foley removed 12/22>>worsening renal function-urinary retention-foley replaced 12/25>>Urine protein creatinine ratio of 7.68-w hematuria-suggestion of new AKI is 2/2 infection GN 12/26>>Temp HD cath placed by IR-HD started 1/2 >> Temp HD cath removed 1/12>>Wound vac removed 1/17>> Transient RUE proximal parasthesias, worse with certain movements but now resolved. questionable positional vs impingement - discussed need for outpatient follow up  Significant studies: 12/11>> CT renal stone study: Possible discitis L2-L3/L3-L4, moderate bilateral hydronephrosis/hydroureter 12/13>> MRI L-spine: Discitis/osteomyelitis at L3-L4 with 9 mm thick ventral canal phlegmon/abscess which extends from L3 to L4-L5.  Multiple paraspinal abscesses within the psoas musculature. 12/13>> echo: EF 60-65%, no evidence of valvular vegetations. 12/14>> MRI left foot: No osteomyelitis/abscess 12/15>> MRI right foot: Abscess with osteomyelitis of second/third metatarsals. 12/25>>Urine protein creatinine ratio of 7.68 12/25>> ASO titer:  Negative 12/25>> ANA: Negative 12/25>> ANCA titers: Negative 12/25>> GBM antibody: Negative 12/25>> C3/C4: Normal limits. 12/25>> HBV/HCV serology:  1/8 >>MRI - 1. Interval decrease in size of the epidural abscess at the L4 level, now measuring up to 2.4 x 0.9 cm, previously 2.7 x 0.9 cm. The degree of spinal canal stenosis at the L3-L4 level is also improved, now moderate, previously severe. 2. 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 3. Heterogeneous appearance of the right kidney with interval decrease in size of previously seen T2 hyperintense structures, favored to represent evolving and resolving infection 1/10-11 >> Renal function elevated 1/12 >> Renal function minimally recovering  Significant microbiology data: 12/10>> COVID/influenza PCR: Negative 12/11>> blood culture: MRSA 12/13>> blood culture: MRSA 12/13>> epidural abscess culture: MRSA 12/14>> blood culture: No growth  Procedures: 12/13>> right L4 laminectomy-sublaminar decompression of ventral epidural abscess. 12/17>> transmetatarsal amputation of right foot 12/26>>Right IJ Trialysis CVC placed by IR  Consults: ID Neurosurgery IR Nephrology  Subjective: No acute issues or events overnight, denies nausea vomiting headache fevers chills or chest pain  Objective:  Vitals: Blood pressure 97/61, pulse 70, temperature 98.1 F (36.7 C), temperature source Oral, resp. rate 18, height 6\' 2"  (1.88 m), weight 134.1 kg, SpO2 96 %.   Exam:  Awake Alert, No new F.N deficits, Normal affect [place].AT,PERRAL Supple Neck, No JVD,   Symmetrical Chest wall movement, Good air movement bilaterally, CTAB RRR,No Gallops, Rubs or new Murmurs,  +ve B.Sounds, Abd Soft, No tenderness,   Right transmetatarsal partial amputation with wound bandage  clean dry intact   Assessment/Plan:  MRSA bacteremia, L3-L4 discitis with epidural abscess-s/p laminectomy/decompression on 12/05/22 - Right foot osteomyelitis/abscess-s/p transmetatarsal amputation on 12/09/22  -  Overall improved-repeat cultures negative so far, Orthopedics/neurosurgery following-able to lift both lower extremities off the bed. Continue to work with PT/OT. -Wound vac removed 1/12 per Ortho - appreciate insight/recommendations. - ID recommending 8 weeks of IV Daptomycin -tentative end date 02/03/2023 -Repeat MRI L spine on 01/02/23- repeat MRI was done on 12/31/2022, results suggest improving infection.  ID recommending repeat MRI prior to completion of antibiotics -Given history of drug use-unfortunately will remain inpatient to complete IV antibiotic duration.  Not a candidate for home therapies.  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  -Urine output continues to be appropriate -Creatinine now downtrending with supportive care - nephrology signing off -Completed intermittent hemodialysis, temporary catheter removed 12/25/22   Hypokalemia, hypomagnesemia: Replete/recheck as appropriate.  Normocytic anemia  - S/P 1 unit of packed RBC on 12/17/2022,  -Slowly downtrending in the setting of iron deficiency versus chronic anemia with recurrent phlebotomy -Continue B12 replacement, iron replacement, folic acid replacement -Change labs to every 48 hours to decrease phlebotomy burden  Peripheral neuropathy - No longer on Neurontin given worsening AKI. Bipolar 1 disorder - Stable, Continue zoloft Polysubstance abuse - History of cocaine/IV use, continue home Suboxone dosing Dental caries -Per imaging, recommend outpatient dentistry follow-up Morbid Obesity: Estimated body mass index is 37.96 kg/m as calculated from the  following:   Height as of this encounter: 6\' 2"  (1.88 m).   Weight as of this encounter: 134.1 kg.   Code status:   Code Status: Full Code   DVT Prophylaxis: heparin injection 5,000 Units Start: 12/16/22 1000 SCD's Start: 12/09/22 1137   Family Communication: None at bedside  Disposition Plan: Status is: Inpatient Remains inpatient appropriate because: Severity of illness and ongoing need for IV antibiotics through 02/03/2023   Planned Discharge Destination:Home ultimately-but will need to remain inpatient to complete IV daptomycin-end date 02/03/23   Diet: Diet Order             Diet regular Room service appropriate? Yes; Fluid consistency: Thin  Diet effective now                    MEDICATIONS: Scheduled Meds:  buprenorphine-naloxone  1 tablet Sublingual BID   vitamin B-12  1,000 mcg Oral Daily   docusate sodium  200 mg Oral BID   ferrous sulfate  325 mg Oral BID WC   folic acid  1 mg Oral Daily   heparin injection (subcutaneous)  5,000 Units Subcutaneous Q8H   magnesium oxide  400 mg Oral BID   nutrition supplement (JUVEN)  1 packet Oral BID BM   pantoprazole  40 mg Oral Daily   polyethylene glycol  17 g Oral Daily   sertraline  25 mg Oral Daily   tamsulosin  0.4 mg Oral QPC supper   thiamine  500 mg Oral Daily   Continuous Infusions:  DAPTOmycin (CUBICIN) 850 mg in sodium chloride 0.9 % IVPB 850 mg (01/10/23 0239)   PRN Meds:.alum & mag hydroxide-simeth, bisacodyl, diphenhydrAMINE, guaiFENesin-dextromethorphan, ondansetron **OR** ondansetron (ZOFRAN) IV, oxyCODONE, phenol, sodium chloride, tiZANidine, traZODone   I have personally reviewed following labs and imaging studies  LABORATORY DATA:  Recent Labs  Lab 01/05/23 0425 01/07/23 0533 01/09/23 0657  WBC 9.6 8.5 9.5  HGB 7.3* 7.4* 7.7*  HCT 22.9* 23.6* 23.2*  PLT 232 235 233  MCV 88.4 89.7 87.2  MCH 28.2 28.1 28.9  MCHC 31.9 31.4  33.2  RDW 14.3 14.6 14.6     Recent Labs  Lab  01/04/23 0536 01/05/23 0421 01/05/23 0425 01/06/23 0253 01/07/23 0533 01/09/23 0657  NA 139  --  138 140 140 140  K 3.4*  --  3.2* 3.2* 4.1 3.5  CL 105  --  102 105 106 103  CO2 20*  --  23 23 22 26   ANIONGAP 14  --  13 12 12 11   GLUCOSE 87  --  95 90 89 96  BUN 37*  --  34* 35* 38* 41*  CREATININE 4.84*  --  4.72* 4.59* 4.43* 4.26*  ALBUMIN 2.2*  --   --  2.3*  --   --   MG  --  1.4*  --   --  1.7  --   CALCIUM 8.3*  --  8.1* 8.6* 8.6* 8.9     RADIOLOGY STUDIES/RESULTS: No results found.   LOS: 38 days   [place] on 01/10/2023 at 7:27 AM   -  To page go to [email or web address]

## 2023-01-11 DIAGNOSIS — R7881 Bacteremia: Secondary | ICD-10-CM | POA: Diagnosis not present

## 2023-01-11 DIAGNOSIS — B9562 Methicillin resistant Staphylococcus aureus infection as the cause of diseases classified elsewhere: Secondary | ICD-10-CM | POA: Diagnosis not present

## 2023-01-11 LAB — CK: Total CK: 74 U/L (ref 49–397)

## 2023-01-11 MED ORDER — CHLORHEXIDINE GLUCONATE CLOTH 2 % EX PADS
6.0000 | MEDICATED_PAD | Freq: Every day | CUTANEOUS | Status: DC
  Administered 2023-01-11 – 2023-02-03 (×25): 6 via TOPICAL

## 2023-01-11 NOTE — Progress Notes (Signed)
PROGRESS NOTE        PATIENT DETAILS Name: Hunter Reyes Age: 47 y.o. Sex: male Date of Birth: 1976/12/14 Admit Date: 12/02/2022 Admitting Physician [name], MD FYB:OFBP, Edwinna Areola, MD  Brief Summary: Patient is a 47 y.o.  male with history of bipolar disorder, crack cocaine use, peripheral neuropathy-who was transferred from [facility] for AKI/urinary retention-upon further evaluation-he was found to have MRSA bacteremia with right foot abscess requiring TMA, lumbar discitis/abscess-requiring emergent laminectomy.  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  Significant events: 12/10>> admit to [facility] at AP-AKI-urinary retention.  Plans to transfer to [facility]. 12/13>> laminectomy/decompression of ventral lumbar epidural abscess 12/17>> transmetatarsal amputation of right foot 12/21>>voiding trial-foley removed 12/22>>worsening renal function-urinary retention-foley replaced 12/25>>Urine protein creatinine ratio of 7.68-w hematuria-suggestion of new AKI is 2/2 infection GN 12/26>>Temp HD cath placed by IR-HD started 1/2 >> Temp HD cath removed 1/12>>Wound vac removed 1/17>> Transient RUE proximal parasthesias, worse with certain movements but now resolved. questionable positional vs impingement - discussed need for outpatient follow up  Significant studies: 12/11>> CT renal stone study: Possible discitis L2-L3/L3-L4, moderate bilateral hydronephrosis/hydroureter 12/13>> MRI L-spine: Discitis/osteomyelitis at L3-L4 with 9 mm thick ventral canal phlegmon/abscess which extends from L3 to L4-L5.  Multiple paraspinal abscesses within the psoas musculature. 12/13>> echo: EF 60-65%, no evidence of valvular vegetations. 12/14>> MRI left foot: No osteomyelitis/abscess 12/15>> MRI right foot: Abscess with osteomyelitis of second/third metatarsals. 12/25>>Urine protein creatinine ratio of 7.68 12/25>> ASO titer:  Negative 12/25>> ANA: Negative 12/25>> ANCA titers: Negative 12/25>> GBM antibody: Negative 12/25>> C3/C4: Normal limits. 12/25>> HBV/HCV serology:  1/8 >>MRI - 1. Interval decrease in size of the epidural abscess at the L4 level, now measuring up to 2.4 x 0.9 cm, previously 2.7 x 0.9 cm. The degree of spinal canal stenosis at the L3-L4 level is also improved, now moderate, previously severe. 2. 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 3. Heterogeneous appearance of the right kidney with interval decrease in size of previously seen T2 hyperintense structures, favored to represent evolving and resolving infection 1/10-11 >> Renal function elevated 1/12 >> Renal function minimally recovering  Significant microbiology data: 12/10>> COVID/influenza PCR: Negative 12/11>> blood culture: MRSA 12/13>> blood culture: MRSA 12/13>> epidural abscess culture: MRSA 12/14>> blood culture: No growth  Procedures: 12/13>> right L4 laminectomy-sublaminar decompression of ventral epidural abscess. 12/17>> transmetatarsal amputation of right foot 12/26>>Right IJ Trialysis CVC placed by IR  Consults: ID Neurosurgery IR Nephrology  Subjective: Mr. Mattie tells me he feels fine. He denies any blurry vision, chest pain, shortness of breath or joint pain.  He does endorse his chronc back pain. He is patiently waiting completion of antibiotics on 2/11. All wounds are stable.  Objective:  Vitals: Blood pressure (!) 100/57, pulse 82, temperature 98.1 F (36.7 C), temperature source Oral, resp. rate 16, height 6\' 2"  (1.88 m), weight 134.1 kg, SpO2 98 %.   Exam:  Physical Exam Constitutional:      Appearance: He is obese.  HENT:     Head: Normocephalic and atraumatic.     Mouth/Throat:     Mouth: Mucous  membranes are dry.  Eyes:     Extraocular Movements:  Extraocular movements intact.     Pupils: Pupils are equal, round, and reactive to light.  Cardiovascular:     Rate and Rhythm: Normal rate and regular rhythm.     Pulses: Normal pulses.     Heart sounds: Normal heart sounds.  Pulmonary:     Effort: Pulmonary effort is normal.     Breath sounds: Normal breath sounds. No wheezing.  Abdominal:     Palpations: Abdomen is soft.     Tenderness: There is no abdominal tenderness.  Musculoskeletal:     Cervical back: Normal range of motion.  Skin:    General: Skin is warm and dry.     Capillary Refill: Capillary refill takes less than 2 seconds.  Neurological:     General: No focal deficit present.  Psychiatric:        Mood and Affect: Mood normal.      Assessment/Plan:  MRSA Bacteremia complicated by U7-O5 Discitis with Epidural Abscess s/p 12/05/22 Decompressoin: - On 1/12, wound vac was removed. - Continue antibtiotics IV Daptomycin with end date 2/11.  Appreciate ID assistance and recommendations.  Right Foot Osteomyelitis s/p 12/09/22 TMA: - Continue antibiotics as above per ID. - Repeat MRI of right foot on 2/11.  Acute Kidney Injury, resolved: - Patient completed a short course of dialysis.  Nephrology has signed off.  Electrolyte Abnormalities: - Monitor  Chronic Normocytic Anemia: - 12/17/22 received 1 unit pRBC. - HH is stable.  Bipolar Disorder: - Mood is stable on Zoloft.  Polysubstance Abuse: - Continue home Suboxone.  Morbid Obesity: - Counseled on weight loss through diet and lifestyle modifications.   Code status:   Code Status: Full Code   DVT Prophylaxis: heparin injection 5,000 Units Start: 12/16/22 1000 SCD's Start: 12/09/22 1137   Family Communication: None at bedside  Disposition Plan: Status is: Inpatient Remains inpatient appropriate because: Severity of illness and ongoing need for IV antibiotics through 02/03/2023   Planned  Discharge Destination:Home - patient will remain inpatient to complete IV daptomycin with end date 02/03/23   Diet: Diet Order             Diet regular Room service appropriate? Yes; Fluid consistency: Thin  Diet effective now                    MEDICATIONS: Scheduled Meds:  buprenorphine-naloxone  1 tablet Sublingual BID   Chlorhexidine Gluconate Cloth  6 each Topical Daily   vitamin B-12  1,000 mcg Oral Daily   docusate sodium  200 mg Oral BID   ferrous sulfate  325 mg Oral BID WC   folic acid  1 mg Oral Daily   heparin injection (subcutaneous)  5,000 Units Subcutaneous Q8H   magnesium oxide  400 mg Oral BID   nutrition supplement (JUVEN)  1 packet Oral BID BM   pantoprazole  40 mg Oral Daily   polyethylene glycol  17 g Oral Daily   sertraline  25 mg Oral Daily   tamsulosin  0.4 mg Oral QPC supper   thiamine  500 mg Oral Daily   Continuous Infusions:  DAPTOmycin (CUBICIN) 850 mg in sodium chloride 0.9 % IVPB 850 mg (01/10/23 0239)   PRN Meds:.alum & mag hydroxide-simeth, bisacodyl, diphenhydrAMINE, guaiFENesin-dextromethorphan, ondansetron **OR** ondansetron (ZOFRAN) IV, oxyCODONE, phenol, sodium chloride, tiZANidine, traZODone   I have personally reviewed following labs and imaging studies  LABORATORY DATA:  Recent Labs  Lab 01/05/23 0425 01/07/23 0533 01/09/23 0657  WBC 9.6 8.5 9.5  HGB  7.3* 7.4* 7.7*  HCT 22.9* 23.6* 23.2*  PLT 232 235 233  MCV 88.4 89.7 87.2  MCH 28.2 28.1 28.9  MCHC 31.9 31.4 33.2  RDW 14.3 14.6 14.6     Recent Labs  Lab 01/05/23 0421 01/05/23 0425 01/06/23 0253 01/07/23 0533 01/09/23 0657  NA  --  138 140 140 140  K  --  3.2* 3.2* 4.1 3.5  CL  --  102 105 106 103  CO2  --  23 23 22 26   ANIONGAP  --  13 12 12 11   GLUCOSE  --  95 90 89 96  BUN  --  34* 35* 38* 41*  CREATININE  --  4.72* 4.59* 4.43* 4.26*  ALBUMIN  --   --  2.3*  --   --   MG 1.4*  --   --  1.7  --   CALCIUM  --  8.1* 8.6* 8.6* 8.9     RADIOLOGY  STUDIES/RESULTS: No results found.   LOS: 39 days   [place] DO on 01/11/2023 at 6:35 PM   -  To page go to [email or web address]

## 2023-01-11 NOTE — Plan of Care (Signed)
  Problem: Fluid Volume: Goal: Hemodynamic stability will improve Outcome: Progressing   Problem: Clinical Measurements: Goal: Signs and symptoms of infection will decrease Outcome: Progressing   Problem: Activity: Goal: Risk for activity intolerance will decrease Outcome: Progressing   Problem: Elimination: Goal: Will not experience complications related to urinary retention Outcome: Progressing   Problem: Pain Managment: Goal: General experience of comfort will improve Outcome: Progressing   Problem: Safety: Goal: Ability to remain free from injury will improve Outcome: Progressing   Problem: Skin Integrity: Goal: Risk for impaired skin integrity will decrease Outcome: Progressing

## 2023-01-11 NOTE — Progress Notes (Signed)
Occupational Therapy Treatment Patient Details Name: Hunter Reyes MRN: [id number] DOB: 07-02-1976 Today's Date: 01/11/2023   History of present illness 47 y.o. male admitted 12/10 with AMS, urinary retention and low back pain. Underwent L4 lumbar laminectomy for epidural abscess 12/13. s/p R foot transmet amp 12/17. Pt initiated HD on 12/26. PMH:  bipolar 1 disorder, borderline personality disorder, COPD, crack cocaine abuse, depression, peripheral neuropathy, history of right foot osteomyelitis, panic attack and history of seizures   OT comments  Session focus on goal assessment and creating new goals for patient. Patient is able to transfer to recliner at supervision level, with improved ability to maintain and recall precautions. Patient advocating that he wants to make his arms stronger so he can move better when his NWB status has cleared with RLE. OT recommendation remains appropriate, will continue to follow acutely.    Recommendations for follow up therapy are one component of a multi-disciplinary discharge planning process, led by the attending physician.  Recommendations may be updated based on patient status, additional functional criteria and insurance authorization.    Follow Up Recommendations  Skilled nursing-short term rehab (<3 hours/day)     Assistance Recommended at Discharge Intermittent Supervision/Assistance  Patient can return home with the following  A lot of help with bathing/dressing/bathroom;Assistance with cooking/housework;Help with stairs or ramp for entrance;Assist for transportation;A lot of help with walking and/or transfers   Equipment Recommendations  Wheelchair (measurements OT);Wheelchair cushion (measurements OT)    Recommendations for Other Services      Precautions / Restrictions Precautions Precautions: Fall;Back Precaution Booklet Issued: No Precaution Comments: watch HR, incontinent stool Required Braces or Orthoses: Other Brace Other  Brace: post op shoe- not present during session Restrictions Weight Bearing Restrictions: Yes RLE Weight Bearing: Non weight bearing       Mobility Bed Mobility Overal bed mobility: Needs Assistance Bed Mobility: Supine to Sit     Supine to sit: Supervision     General bed mobility comments: patient able to get to EOB and back to supine without assistance    Transfers Overall transfer level: Needs assistance Equipment used: None Transfers: Bed to chair/wheelchair/BSC            Lateral/Scoot Transfers: Min guard, Supervision General transfer comment: No assist given with cues only to maintain NWB right LE. Pt scooted to drop arm recliner.     Balance Overall balance assessment: Needs assistance Sitting-balance support: No upper extremity supported, Feet supported Sitting balance-[name] Scale: Good                                     ADL either performed or assessed with clinical judgement   ADL Overall ADL's : Needs assistance/impaired     Grooming: Wash/dry hands;Wash/dry face;Sitting           Upper Body Dressing : Sitting;Min guard       Toilet Transfer: Minimal assistance;Requires drop arm;BSC/3in1 Toilet Transfer Details (indicate cue type and reason): simulated with transfer to recliner         Functional mobility during ADLs: Cueing for sequencing;Cueing for safety;Minimal assistance General ADL Comments: session focus on goal update and increasing overall activity tolerance    Extremity/Trunk Assessment              Vision       Perception     Praxis      Cognition Arousal/Alertness: Awake/alert Behavior During Therapy: [facility]  for tasks assessed/performed Overall Cognitive Status: No family/caregiver present to determine baseline cognitive functioning Area of Impairment: Attention, Problem solving, Memory, Following commands, Safety/judgement, Awareness                   Current Attention Level:  Sustained Memory: Decreased recall of precautions Following Commands: Follows one step commands with increased time Safety/Judgement: Decreased awareness of safety Awareness: Emergent Problem Solving: Slow processing General Comments: Pt did better with maintaining NWB with less cues        Exercises      Shoulder Instructions       General Comments      Pertinent Vitals/ Pain       Pain Assessment Pain Assessment: Faces Faces Pain Scale: Hurts a little bit Pain Location: R arm Pain Descriptors / Indicators: Burning, Aching, Grimacing, Guarding Pain Intervention(s): Limited activity within patient's tolerance, Monitored during session, Repositioned  Home Living                                          Prior Functioning/Environment              Frequency  Min 2X/week        Progress Toward Goals  OT Goals(current goals can now be found in the care plan section)  Progress towards OT goals: Goals met and updated - see care plan  Acute Rehab OT Goals Patient Stated Goal: get stronger so I can be better when I leave OT Goal Formulation: With patient Time For Goal Achievement: 01/25/23 Potential to Achieve Goals: Fair ADL Goals Pt/caregiver will Perform Home Exercise Program: Increased ROM;Increased strength;Both right and left upper extremity;With written HEP provided;With theraband;Independently Additional ADL Goal #2: Patient will be able to dictate what he needs (chair set up, commode etc) to promote increased carry over with functional independence with transfers.  Plan Discharge plan remains appropriate    Co-evaluation                 AM-PAC OT "6 Clicks" Daily Activity     Outcome Measure   Help from another person eating meals?: None Help from another person taking care of personal grooming?: A Little Help from another person toileting, which includes using toliet, bedpan, or urinal?: A Lot Help from another person bathing  (including washing, rinsing, drying)?: A Lot Help from another person to put on and taking off regular upper body clothing?: A Little Help from another person to put on and taking off regular lower body clothing?: A Lot 6 Click Score: 16    End of Session    OT Visit Diagnosis: Unsteadiness on feet (R26.81);Muscle weakness (generalized) (M62.81);Pain;Other symptoms and signs involving cognitive function;Other abnormalities of gait and mobility (R26.89)   Activity Tolerance Patient tolerated treatment well   Patient Left in bed;with call bell/phone within reach;with chair alarm set   Nurse Communication Mobility status        Time: 0737-1062 OT Time Calculation (min): 19 min  Charges: OT General Charges $OT Visit: 1 Visit OT Treatments $Self Care/Home Management : 8-22 mins  Corinne Ports E. Kana Reimann, OTR/L Acute Rehabilitation Services [phone]   Ascencion Dike 01/11/2023, 12:08 PM

## 2023-01-12 DIAGNOSIS — B9562 Methicillin resistant Staphylococcus aureus infection as the cause of diseases classified elsewhere: Secondary | ICD-10-CM | POA: Diagnosis not present

## 2023-01-12 DIAGNOSIS — R7881 Bacteremia: Secondary | ICD-10-CM | POA: Diagnosis not present

## 2023-01-12 MED ORDER — POTASSIUM CHLORIDE CRYS ER 20 MEQ PO TBCR
20.0000 meq | EXTENDED_RELEASE_TABLET | Freq: Once | ORAL | Status: AC
  Administered 2023-01-12: 20 meq via ORAL
  Filled 2023-01-12 (×2): qty 1

## 2023-01-12 NOTE — Progress Notes (Signed)
SCD placed per order. Patient is satisfied,

## 2023-01-12 NOTE — Progress Notes (Addendum)
PROGRESS NOTE        PATIENT DETAILS Name: Hunter Reyes Age: 47 y.o. Sex: male Date of Birth: Feb 10, 1976 Admit Date: 12/02/2022 Admitting Physician [name], MD SFK:CLEX, Edwinna Areola, MD  Brief Summary: Patient is a 47 y.o.  male with a PMH of Bipolar Disorder, Polysubstance Abuse including crack cocaine, Hepatitis B, COPD, and Peripheral Neuropathy who was admitted on 12/02/22 with acute renal failure with obstructive uropathy and bilateral hydroureternephrosis.  A foley was placed and he was managed by Nephrology with temporary dialysis and improved.  He also complained of back pain and was found to have an L4 broad ventral epidural abscess with subacute symptoms of cauda equina syndrome.  On 12/05/22, he underwent a right L4 laminotomy and decompression with abscess debridement.  On 12/07/22, he was found to have an abscess and osteomyelitis of the right foot and underwent a transmetatarsal amputation.  On 12/18/22, patient had a right CVC placed by IR.  Infectious Disease recommended a prolonged course of IV Daptomycin with end date 2/11.  He can be discharged home after this.  Significant events: 12/10>> admit to [facility] at AP-AKI-urinary retention.  Plans to transfer to [facility] 12/13>> laminectomy/decompression of ventral lumbar epidural abscess 12/17>> transmetatarsal amputation of right foot 12/21>>voiding trial-foley removed 12/22>>worsening renal function-urinary retention-foley replaced 12/25>>Urine protein creatinine ratio of 7.68-w hematuria-suggestion of new AKI is 2/2 infection GN 12/26>>Temp HD cath placed by IR-HD started 1/2 >> Temp HD cath removed 1/12>>Wound vac removed 1/17>> Transient RUE proximal parasthesias, worse with certain movements but now resolved. questionable positional vs impingement - discussed need for outpatient follow up  Significant studies: 12/11>> CT renal stone study: Possible discitis L2-L3/L3-L4, moderate bilateral  hydronephrosis/hydroureter 12/13>> MRI L-spine: Discitis/osteomyelitis at L3-L4 with 9 mm thick ventral canal phlegmon/abscess which extends from L3 to L4-L5.  Multiple paraspinal abscesses within the psoas musculature. 12/13>> echo: EF 60-65%, no evidence of valvular vegetations. 12/14>> MRI left foot: No osteomyelitis/abscess 12/15>> MRI right foot: Abscess with osteomyelitis of second/third metatarsals. 12/25>>Urine protein creatinine ratio of 7.68 12/25>> ASO titer: Negative 12/25>> ANA: Negative 12/25>> ANCA titers: Negative 12/25>> GBM antibody: Negative 12/25>> C3/C4: Normal limits. 12/25>> HBV/HCV serology:  1/8 >>MRI - 1. Interval decrease in size of the epidural abscess at the L4 level, now measuring up to 2.4 x 0.9 cm, previously 2.7 x 0.9 cm. The degree of spinal canal stenosis at the L3-L4 level is also improved, now moderate, previously severe. 2. 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 3. Heterogeneous appearance of the right kidney with interval decrease in size of previously seen T2 hyperintense structures, favored to represent evolving and resolving infection 1/10-11 >> Renal function elevated 1/12 >> Renal function minimally recovering  Significant microbiology data: 12/10>> COVID/influenza PCR: Negative 12/11>> blood culture: MRSA 12/13>> blood culture: MRSA 12/13>> epidural abscess culture: MRSA 12/14>> blood culture: No growth  Procedures: 12/13>> right L4 laminectomy-sublaminar decompression of ventral epidural abscess. 12/17>> transmetatarsal amputation of right foot 12/26>>Right IJ Trialysis CVC placed by IR  Consults: ID Neurosurgery IR Nephrology  Subjective: [name] tells me he feels ok.   He denies any muscles aches. He denies any blurry vision, chest pain,  shortness of breath or joint pain.  He  does endorse his chronc back pain. He is patiently waiting completion of antibiotics on 2/11. All wounds are stable.  Objective:  Vitals: Blood pressure 98/63, pulse 71, temperature 97.9 F (36.6 C), temperature source Oral, resp. rate 18, height 6\' 2"  (1.88 m), weight 134.1 kg, SpO2 97 %.   Exam:  Physical Exam Constitutional:      Appearance: He is obese.  HENT:     Head: Normocephalic and atraumatic.     Mouth/Throat:     Mouth: Mucous membranes are dry.  Eyes:     Extraocular Movements: Extraocular movements intact.     Pupils: Pupils are equal, round, and reactive to light.  Cardiovascular:     Rate and Rhythm: Normal rate and regular rhythm.     Pulses: Normal pulses.     Heart sounds: Normal heart sounds.  Pulmonary:     Effort: Pulmonary effort is normal.     Breath sounds: Normal breath sounds. No wheezing.  Abdominal:     Palpations: Abdomen is soft.     Tenderness: There is no abdominal tenderness.  Musculoskeletal:     Cervical back: Normal range of motion.  Skin:    General: Skin is warm and dry.     Capillary Refill: Capillary refill takes less than 2 seconds.  Neurological:     General: No focal deficit present.  Psychiatric:        Mood and Affect: Mood normal.      Assessment/Plan:  MRSA Bacteremia complicated by Y1-O1 Discitis with Epidural Abscess s/p 12/05/22 Decompression: - On 1/12, wound vac was removed. - Continue antibtiotics IV Daptomycin with end date 2/11.   - Checks CK and labs on Mondays.   - Appreciate Infectious Disease assistance and recommendations.  Right Foot Osteomyelitis s/p 12/09/22 TMA: - Continue antibiotics as above per ID. - Repeat MRI of right foot on 2/11.  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 - Patient completed a short course of dialysis early in hospitalization.   - If creatinine worsens, we will reconsult Nephrology, appreciate.  Electrolyte Abnormalities: -  Monitor  Chronic Normocytic Anemia: - 12/17/22 received 1 unit pRBC. - HH is stable.  Bipolar Disorder: - Mood is stable on Zoloft.  Polysubstance Abuse: - Continue home Suboxone.  Morbid Obesity: - Counseled on weight loss through diet and lifestyle modifications.   Code status:   Code Status: Full Code   DVT Prophylaxis: heparin injection 5,000 Units Start: 12/16/22 1000 SCD's Start: 12/09/22 1137   Family Communication: None at bedside  Disposition Plan: Status is: Inpatient Remains inpatient appropriate because: Severity of illness and ongoing need for IV antibiotics through 02/03/2023   Planned Discharge Destination:Home - patient will remain inpatient to complete IV daptomycin with end date 02/03/23   Diet: Diet Order             Diet regular Room service appropriate? Yes; Fluid consistency: Thin  Diet effective now                 - Change Diet to Renal Diet.   MEDICATIONS: Scheduled Meds:  buprenorphine-naloxone  1 tablet Sublingual BID   Chlorhexidine Gluconate Cloth  6 each Topical Daily   vitamin B-12  1,000 mcg Oral Daily   docusate sodium  200 mg Oral BID   ferrous sulfate  325 mg Oral BID WC   folic acid  1 mg Oral Daily   heparin injection (subcutaneous)  5,000 Units Subcutaneous Q8H   magnesium oxide  400 mg Oral BID   nutrition supplement (JUVEN)  1 packet Oral BID BM   pantoprazole  40 mg Oral Daily   polyethylene glycol  17 g Oral Daily   sertraline  25 mg Oral Daily   tamsulosin  0.4 mg Oral QPC supper   thiamine  500 mg Oral Daily   Continuous Infusions:  DAPTOmycin (CUBICIN) 850 mg in sodium chloride 0.9 % IVPB 850 mg (01/12/23 0052)   PRN Meds:.alum & mag hydroxide-simeth, bisacodyl, diphenhydrAMINE, guaiFENesin-dextromethorphan, ondansetron **OR** ondansetron (ZOFRAN) IV, oxyCODONE, phenol, sodium chloride, tiZANidine, traZODone   I have personally reviewed following labs and imaging studies  LABORATORY DATA:  Recent Labs   Lab 01/07/23 0533 01/09/23 0657  WBC 8.5 9.5  HGB 7.4* 7.7*  HCT 23.6* 23.2*  PLT 235 233  MCV 89.7 87.2  MCH 28.1 28.9  MCHC 31.4 33.2  RDW 14.6 14.6     Recent Labs  Lab 01/06/23 0253 01/07/23 0533 01/09/23 0657  NA 140 140 140  K 3.2* 4.1 3.5  CL 105 106 103  CO2 23 22 26   ANIONGAP 12 12 11   GLUCOSE 90 89 96  BUN 35* 38* 41*  CREATININE 4.59* 4.43* 4.26*  ALBUMIN 2.3*  --   --   MG  --  1.7  --   CALCIUM 8.6* 8.6* 8.9     RADIOLOGY STUDIES/RESULTS: No results found.   LOS: 40 days   Signature  -    George Hugh MD on 01/12/2023 at 5:22 PM   -  To page go to [email or web address]

## 2023-01-13 DIAGNOSIS — B9562 Methicillin resistant Staphylococcus aureus infection as the cause of diseases classified elsewhere: Secondary | ICD-10-CM | POA: Diagnosis not present

## 2023-01-13 DIAGNOSIS — R7881 Bacteremia: Secondary | ICD-10-CM | POA: Diagnosis not present

## 2023-01-13 LAB — BASIC METABOLIC PANEL
Anion gap: 12 (ref 5–15)
BUN: 47 mg/dL — ABNORMAL HIGH (ref 6–20)
CO2: 21 mmol/L — ABNORMAL LOW (ref 22–32)
Calcium: 8.8 mg/dL — ABNORMAL LOW (ref 8.9–10.3)
Chloride: 103 mmol/L (ref 98–111)
Creatinine, Ser: 5.06 mg/dL — ABNORMAL HIGH (ref 0.61–1.24)
GFR, Estimated: 14 mL/min — ABNORMAL LOW (ref >60)
Glucose, Bld: 92 mg/dL (ref 70–99)
Potassium: 3.7 mmol/L (ref 3.5–5.1)
Sodium: 136 mmol/L (ref 135–145)

## 2023-01-13 LAB — CBC
HCT: 22.7 % — ABNORMAL LOW (ref 39.0–52.0)
Hemoglobin: 7.5 g/dL — ABNORMAL LOW (ref 13.0–17.0)
MCH: 28.7 pg (ref 26.0–34.0)
MCHC: 33 g/dL (ref 30.0–36.0)
MCV: 87 fL (ref 80.0–100.0)
Platelets: 231 10*3/uL (ref 150–400)
RBC: 2.61 MIL/uL — ABNORMAL LOW (ref 4.22–5.81)
RDW: 14.8 % (ref 11.5–15.5)
WBC: 9.6 10*3/uL (ref 4.0–10.5)
nRBC: 0 % (ref 0.0–0.2)

## 2023-01-13 MED ORDER — SODIUM CHLORIDE 0.9 % IV SOLN: INTRAVENOUS | Status: AC

## 2023-01-13 NOTE — Progress Notes (Addendum)
PROGRESS NOTE        PATIENT DETAILS Name: Hunter Reyes Age: 47 y.o. Sex: male Date of Birth: 09-May-1976 Admit Date: 12/02/2022 Admitting Physician [name], MD HRC:BULA, Edwinna Areola, MD  Brief Summary: Patient is a 47 y.o.  male with a PMH of Bipolar Disorder, Polysubstance Abuse (including crack cocaine), Hepatitis B, COPD, and Peripheral Neuropathy who was admitted on 12/02/22 with acute renal failure with obstructive uropathy and bilateral hydroureternephrosis.  A foley was placed and he underwent temporary dialysis.  Hunter Reyes also complained of back pain and was found to have an L4 broad ventral epidural abscess with subacute symptoms of cauda equina syndrome.  He was found to have MRSA Bacteremia secondary to his L4 epidural abscess.  On 12/05/22, he underwent a right L4 laminotomy and decompression with abscess debridement.   On 12/07/22, he was also found to have an abscess and osteomyelitis of the right foot and on 12/09/22 he underwent a transmetatarsal amputation.   On 12/18/22, patient had a right Trialysis CVC placed by IR.   From 12/18/22 - 12/25/22, patient underwent dialysis.  His Creatinine has been ~ 4-5 mg/dL with GFR ~ 15 mL/min since then.  He has excellent urine output. Nephrology has signed off. On 01/04/23, right foot wound vac was removed.  Infectious Disease recommended a prolonged course of IV Daptomycin with end date 2/11.  He will need repeat imaging of lumbar spine on 2/11 and can be discharged home after this.  Consults: Infectious Disease Neurosurgery Nephrology  Subjective: Hunter Reyes feels great. His creatinine is up to 5 mg/dL.  Urine Output is 850 Ccs x 24 hours.  I spoke with Nephrology and we will give gentle fluids and monitor renal function.   01/06/23  01/07/23  01/09/23  01/13/23   Creatinine 4.59 (H) 4.43 (H) 4.26 (H) 5.06 (H)   He denies any muscles aches while on Daptomycin. He denies any blurry vision, chest  pain, shortness of breath or joint pain.  He does endorse his chronc back pain. All wounds are stable.  Objective:  Vitals: Blood pressure 109/71, pulse 73, temperature 97.9 F (36.6 C), temperature source Oral, resp. rate 18, height 6\' 2"  (1.88 m), weight 134.1 kg, SpO2 97 %.   Exam:  Physical Exam Constitutional:      Appearance: He is obese.  HENT:     Head: Normocephalic and atraumatic.     Mouth/Throat:     Mouth: Mucous membranes are dry.  Eyes:     Extraocular Movements: Extraocular movements intact.     Pupils: Pupils are equal, round, and reactive to light.  Cardiovascular:     Rate and Rhythm: Normal rate and regular rhythm.     Pulses: Normal pulses.     Heart sounds: Normal heart sounds.  Pulmonary:     Effort: Pulmonary effort is normal.     Breath sounds: Normal breath sounds. No wheezing.  Abdominal:     Palpations: Abdomen is soft.     Tenderness: There is no abdominal tenderness.  Musculoskeletal:     Cervical back: Normal range of motion.  Skin:    General: Skin is warm and dry.     Capillary Refill: Capillary refill takes less than 2 seconds.  Neurological:     General: No focal deficit present.  Psychiatric:  Mood and Affect: Mood normal.      Assessment/Plan:  MRSA Bacteremia complicated by Y8-M5 Discitis with Epidural Abscess s/p 12/05/22 Decompression: - Continue antibtiotics IV Daptomycin with end date 2/11.   - Check CK and labs on Mondays.   - 1/8 Repeat MRI of Lumbar Spine did not show full resolution of diskitis.  Repeat MRI of Lumbar Spine W WO contrast on 2/11 after completion of antibiotics. - Appreciate Infectious Disease assistance and recommendations.  Right Foot Abscess and Osteomyelitis s/p 12/09/22 TMA: - Continue antibiotics as above per Infectious Disease.  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 - Start Normal Saline 125 CC/hr x 10 hours. - Repeat BMP in am. - If creatinine continues to worsen, we will reconsult  Nephrology, appreciate.  Electrolyte Abnormalities: - Monitor  Chronic Normocytic Anemia: - 12/17/22 received 1 unit pRBC. - HH is stable.  Bipolar Disorder: - Mood is stable on Zoloft.  Polysubstance Abuse: - Continue home Suboxone. - Recommend Hepatitis vaccinations.  Morbid Obesity: - Counseled on weight loss through diet and lifestyle modifications.   Code status:   Code Status: Full Code   DVT Prophylaxis: heparin injection 5,000 Units Start: 12/16/22 1000 SCD's Start: 12/09/22 1137   Family Communication: None at bedside  Disposition Plan: Status is: Inpatient Remains inpatient appropriate because: Severity of illness and ongoing need for IV antibiotics through 02/03/2023   Planned Discharge Destination:Home - patient will remain inpatient to complete IV daptomycin with end date 02/03/23   Diet: Diet Order             Diet renal with fluid restriction Fluid restriction: 1200 mL Fluid; Room service appropriate? Yes; Fluid consistency: Thin  Diet effective now                 - Renal Diet  MEDICATIONS: Scheduled Meds:  buprenorphine-naloxone  1 tablet Sublingual BID   Chlorhexidine Gluconate Cloth  6 each Topical Daily   vitamin B-12  1,000 mcg Oral Daily   docusate sodium  200 mg Oral BID   ferrous sulfate  325 mg Oral BID WC   folic acid  1 mg Oral Daily   heparin injection (subcutaneous)  5,000 Units Subcutaneous Q8H   magnesium oxide  400 mg Oral BID   nutrition supplement (JUVEN)  1 packet Oral BID BM   pantoprazole  40 mg Oral Daily   polyethylene glycol  17 g Oral Daily   sertraline  25 mg Oral Daily   tamsulosin  0.4 mg Oral QPC supper   thiamine  500 mg Oral Daily   Continuous Infusions:  sodium chloride 125 mL/hr at 01/13/23 1153   DAPTOmycin (CUBICIN) 850 mg in sodium chloride 0.9 % IVPB 850 mg (01/12/23 0052)   PRN Meds:.alum & mag hydroxide-simeth, bisacodyl, diphenhydrAMINE, guaiFENesin-dextromethorphan, ondansetron **OR**  ondansetron (ZOFRAN) IV, oxyCODONE, phenol, sodium chloride, tiZANidine, traZODone   I have personally reviewed following labs and imaging studies  LABORATORY DATA:  Recent Labs  Lab 01/07/23 0533 01/09/23 0657 01/13/23 0452  WBC 8.5 9.5 9.6  HGB 7.4* 7.7* 7.5*  HCT 23.6* 23.2* 22.7*  PLT 235 233 231  MCV 89.7 87.2 87.0  MCH 28.1 28.9 28.7  MCHC 31.4 33.2 33.0  RDW 14.6 14.6 14.8     Recent Labs  Lab 01/07/23 0533 01/09/23 0657 01/13/23 0452  NA 140 140 136  K 4.1 3.5 3.7  CL 106 103 103  CO2 22 26 21*  ANIONGAP 12 11 12   GLUCOSE  89 96 92  BUN 38* 41* 47*  CREATININE 4.43* 4.26* 5.06*  MG 1.7  --   --   CALCIUM 8.6* 8.9 8.8*     RADIOLOGY STUDIES/RESULTS: No results found.   LOS: 41 days   Signature  -    George Hugh MD on 01/13/2023 at 6:11 PM   -  To page go to [email or web address]

## 2023-01-14 DIAGNOSIS — B9562 Methicillin resistant Staphylococcus aureus infection as the cause of diseases classified elsewhere: Secondary | ICD-10-CM | POA: Diagnosis not present

## 2023-01-14 DIAGNOSIS — R7881 Bacteremia: Secondary | ICD-10-CM | POA: Diagnosis not present

## 2023-01-14 LAB — BASIC METABOLIC PANEL
Anion gap: 13 (ref 5–15)
BUN: 48 mg/dL — ABNORMAL HIGH (ref 6–20)
CO2: 22 mmol/L (ref 22–32)
Calcium: 8.9 mg/dL (ref 8.9–10.3)
Chloride: 104 mmol/L (ref 98–111)
Creatinine, Ser: 4.97 mg/dL — ABNORMAL HIGH (ref 0.61–1.24)
GFR, Estimated: 14 mL/min — ABNORMAL LOW (ref >60)
Glucose, Bld: 90 mg/dL (ref 70–99)
Potassium: 3.7 mmol/L (ref 3.5–5.1)
Sodium: 139 mmol/L (ref 135–145)

## 2023-01-14 LAB — CK: Total CK: 54 U/L (ref 49–397)

## 2023-01-14 NOTE — Progress Notes (Signed)
[facility] PROGRESS NOTE  Hunter Reyes (DOB: [birth year]) MGQ:[id number] PCP: Celene Squibb, MD  Brief Narrative: Hunter Reyes is a 47 y.o. male with a PMH of bipolar disorder, polysubstance abuse, COPD, chronic hepatitis B, and peripheral neuropathy who was admitted 12/02/2022 with acute renal failure with obstructive uropathy. Foley catheter was placed and the patient required dialysis intermittently up until 12/25/2022. Also found to have L4 epidural abscess with subacute symptoms of cauda equina syndrome necessitating a right L4 laminectomy and debridement 12/05/2022. Blood cultures grew MRSA. Additionally found to have abscess and osteomyelitis of the right foot for which transmetatarsal amputation was performed 12/09/2022. He continues on daptomycin with end date 02/03/2023 at which time he will need repeat lumbar spine imaging. Creatinine remains in the 4-5 range though with adequate output, not requiring dialysis.   Subjective: No new complaints. Pain in lower back is stable, controlled  Objective: BP (!) 108/90 (BP Location: Right Arm)   Pulse 71   Temp 97.9 F (36.6 C) (Oral)   Resp 17   Ht 6\' 2"  (1.88 m)   Wt 134.1 kg   SpO2 99%   BMI 37.96 kg/m   Gen: Chronically ill-appearing male in no distress Pulm: Clear, nonlabored  CV: RRR, no MRG or edema GI: Soft, NT, ND, +BS  Neuro: Alert and oriented. No new focal deficits.  Assessment & Plan: Principal Problem:   MRSA bacteremia Active Problems:   AKI (acute kidney injury) ([place])   Diskitis   Bipolar 1 disorder ([place])   Peripheral neuropathy   Polysubstance abuse ([facility])   Metabolic acidosis   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 ([facility])   Cutaneous abscess of right foot   H/O laminectomy   Acute urinary retention   Acute renal failure ([facility])   Hyperkalemia   Acute anemia   Opioid dependence in remission ([place])   Epidural abscess  MRSA bacteremia complicated by O6-Z1 discitis with epidural abscess  s/p decompression/debridement 12/05/22: - Continue daptomycin with end date 2/11. Repeat MRI on 1/8 did not show full resolution of diskitis. Will need repeat MRI of Lumbar Spine W WO contrast toward the end of therapy/on 2/11 - Check CK and labs on Mondays.      Right Foot Abscess and Osteomyelitis s/p 12/09/22 TMA: - Continue antibiotics as above per ID   AKI on stage IV CKD: CrCl stabilized in 15-30ml/min range with SCr in 4-5 range. UOP adequate and electrolytes stable.  - Avoid nephrotoxins, renally dose appropriate medications, monitor CrCl.   Hypokalemia: Resolved.   Chronic Normocytic Anemia: 12/17/22 received 1 unit pRBC. - Hgb stable with no bleeding.    Bipolar Disorder: - Mood is stable on Zoloft.   Polysubstance abuse, OUD: Hx crack cocaine use. No current evidence of withdrawal - Continue home suboxone. - Cessation counseling provided  Patrecia Pour, MD [facility] [email or web address] 01/14/2023, 1:25 PM

## 2023-01-15 DIAGNOSIS — B9562 Methicillin resistant Staphylococcus aureus infection as the cause of diseases classified elsewhere: Secondary | ICD-10-CM | POA: Diagnosis not present

## 2023-01-15 DIAGNOSIS — R7881 Bacteremia: Secondary | ICD-10-CM | POA: Diagnosis not present

## 2023-01-15 LAB — TYPE AND SCREEN
ABO/RH(D): O POS
Antibody Screen: NEGATIVE

## 2023-01-15 LAB — HEMOGLOBIN AND HEMATOCRIT, BLOOD
HCT: 24.1 % — ABNORMAL LOW (ref 39.0–52.0)
Hemoglobin: 7.6 g/dL — ABNORMAL LOW (ref 13.0–17.0)

## 2023-01-15 NOTE — Progress Notes (Addendum)
[facility] PROGRESS NOTE  TOURE EDMONDS (DOB: Jul 14, 1976) GEX:[id number] PCP: Celene Squibb, MD  Brief Narrative: Hunter Reyes is a 47 y.o. male with a PMH of bipolar disorder, polysubstance abuse, COPD, chronic hepatitis B, and peripheral neuropathy who was admitted 12/02/2022 with acute renal failure with obstructive uropathy. Foley catheter was placed and the patient required dialysis intermittently up until 12/25/2022. Also found to have L4 epidural abscess with subacute symptoms of cauda equina syndrome necessitating a right L4 laminectomy and debridement 12/05/2022. Blood cultures grew MRSA. Additionally found to have abscess and osteomyelitis of the right foot for which transmetatarsal amputation was performed 12/09/2022. He continues on daptomycin with end date 02/03/2023 at which time he will need repeat lumbar spine imaging. Creatinine remains in the 4-5 range though with adequate output, not requiring dialysis.   Subjective: Had nosebleed again yesterday, thinks he picks his nose in his sleep and wakes up with nosebleed a couple times this admission. Always stops in a minute or two. Hasn't been allowed to trim fingernails. No other bleeding. Back pain improved/controlled.   Objective: BP 110/66 (BP Location: Right Arm)   Pulse 75   Temp (!) 97.5 F (36.4 C) (Oral)   Resp 18   Ht 6\' 2"  (1.88 m)   Wt 134.1 kg   SpO2 99%   BMI 37.96 kg/m   Gen: Chronically ill-appearing male in no distress HEENT: Edentulous. Nares unremarkable, no active or recent bleeding evident.  Pulm: Clear, nonlabored CV: RRR, no MRG or edema GI: +BS, soft, NT, ND Neuro: Alert, oriented, no focal deficits Ext: No new deformity. TMA noted.  Assessment & Plan: MRSA bacteremia complicated by W1-U2 discitis with epidural abscess s/p decompression/debridement 12/05/22: - Continue daptomycin with end date 2/11. Repeat MRI on 1/8 did not show full resolution of diskitis. Will need repeat MRI of  Lumbar Spine W WO contrast toward the end of therapy. - Check CK and labs on Mondays.      Right Foot Abscess and Osteomyelitis s/p 12/09/22 TMA: Margins were reportedly clear. - Continue antibiotics as above per ID - Will reach out to Dr. Sharol Given Re: weight bearing restrictions at this time to facilitate PT.   AKI on stage IV CKD: CrCl stabilized in 15-30ml/min range with SCr in 4-5 range. UOP adequate and electrolytes stable.  - Avoid nephrotoxins, renally dose appropriate medications, monitor CrCl.   Hypokalemia: Resolved.   Chronic Normocytic Anemia: 12/17/22 received 1 unit pRBC. - Monitoring regularly   Epistaxis: Seems to be sporadic, but recurrent today. - Trim fingernails.  - With recurrent bleeding, will hold heparin VTE ppx for now. - Recheck H/H. Platelet count normal, though may be qualitatively impaired. Last Hgb was 7.5g/dl, will check T&S as well and transfuse if ongoing bleeding and/or < 7g/dl. - Continue po iron   Bipolar Disorder: - Mood is stable on zoloft.   Polysubstance abuse, OUD: Hx crack cocaine use. No current evidence of withdrawal - Continue home suboxone. - Cessation counseling provided  Patrecia Pour, MD [facility] [email or web address] 01/15/2023, 1:12 PM

## 2023-01-15 NOTE — Progress Notes (Signed)
Occupational Therapy Treatment Patient Details Name: Hunter Reyes MRN: [id number] DOB: July 31, 1976 Today's Date: 01/15/2023   History of present illness 47 y.o. male admitted 12/10 with AMS, urinary retention and low back pain. Underwent L4 lumbar laminectomy for epidural abscess 12/13. s/p R foot transmet amp 12/17. Pt initiated HD on 12/26. PMH:  bipolar 1 disorder, borderline personality disorder, COPD, crack cocaine abuse, depression, peripheral neuropathy, history of right foot osteomyelitis, panic attack and history of seizures   OT comments  Patient received in supine and stating he was tired from PT session but agreeable to work with OT on grooming and UE HEP. Patient continues to require instructions to perform UE HEP correctly and required frequent rest breaks to complete. Patient would benefit from continued OT treatment to address commode transfers to drop arm commode and self care. Patient limited by pain and NWB on RLE. Discharge recommendations continue to be appropriate.    Recommendations for follow up therapy are one component of a multi-disciplinary discharge planning process, led by the attending physician.  Recommendations may be updated based on patient status, additional functional criteria and insurance authorization.    Follow Up Recommendations  Skilled nursing-short term rehab (<3 hours/day)     Assistance Recommended at Discharge Intermittent Supervision/Assistance  Patient can return home with the following  A lot of help with bathing/dressing/bathroom;Assistance with cooking/housework;Help with stairs or ramp for entrance;Assist for transportation;A lot of help with walking and/or transfers   Equipment Recommendations  Wheelchair (measurements OT);Wheelchair cushion (measurements OT)    Recommendations for Other Services      Precautions / Restrictions Precautions Precautions: Fall;Back Precaution Booklet Issued: No Precaution Comments: watch HR,  incontinent stool Required Braces or Orthoses: Other Brace Other Brace: post op shoe- not present during session Restrictions Weight Bearing Restrictions: Yes RLE Weight Bearing: Non weight bearing       Mobility Bed Mobility Overal bed mobility: Needs Assistance             General bed mobility comments: up in recliner    Transfers Overall transfer level: Needs assistance                 General transfer comment: up in recliner and declined further transfers     Balance Overall balance assessment: Needs assistance Sitting-balance support: No upper extremity supported, Feet supported Sitting balance-[name] Scale: Good Sitting balance - Comments: up in recliner                                   ADL either performed or assessed with clinical judgement   ADL Overall ADL's : Needs assistance/impaired     Grooming: Wash/dry hands;Wash/dry face;Oral care;Set up;Sitting Grooming Details (indicate cue type and reason): in recliner                               General ADL Comments: focused on grooming and UE HEP    Extremity/Trunk Assessment              Vision       Perception     Praxis      Cognition Arousal/Alertness: Awake/alert Behavior During Therapy: WFL for tasks assessed/performed Overall Cognitive Status: No family/caregiver present to determine baseline cognitive functioning Area of Impairment: Attention, Problem solving, Memory, Following commands, Safety/judgement, Awareness  Orientation Level: Disoriented to, Time Current Attention Level: Sustained Memory: Decreased recall of precautions Following Commands: Follows one step commands with increased time Safety/Judgement: Decreased awareness of safety Awareness: Emergent Problem Solving: Slow processing          Exercises Exercises: General Upper Extremity General Exercises - Upper Extremity Shoulder Flexion: Strengthening, Both,  10 reps, Theraband Theraband Level (Shoulder Flexion): Level 2 (Red) Shoulder ABduction: Strengthening, Both, 10 reps, Theraband Theraband Level (Shoulder Abduction): Level 2 (Red) Shoulder Horizontal ABduction: Strengthening, 10 reps, Seated, Theraband Theraband Level (Shoulder Horizontal Abduction): Level 2 (Red) Elbow Flexion: Both, 10 reps, Seated, Strengthening, Theraband Theraband Level (Elbow Flexion): Level 2 (Red) Elbow Extension: Strengthening, Both, 10 reps, Seated, Theraband Theraband Level (Elbow Extension): Level 2 (Red)    Shoulder Instructions       General Comments      Pertinent Vitals/ Pain       Pain Assessment Pain Assessment: Faces Faces Pain Scale: Hurts whole lot Pain Location: right foot and back Pain Descriptors / Indicators: Burning, Aching, Grimacing, Guarding Pain Intervention(s): Limited activity within patient's tolerance, Monitored during session, Repositioned  Home Living                                          Prior Functioning/Environment              Frequency  Min 2X/week        Progress Toward Goals  OT Goals(current goals can now be found in the care plan section)  Progress towards OT goals: Progressing toward goals  Acute Rehab OT Goals Patient Stated Goal: get better OT Goal Formulation: With patient Time For Goal Achievement: 01/25/23 Potential to Achieve Goals: Fair ADL Goals Pt Will Perform Grooming: sitting;with set-up Pt Will Perform Lower Body Dressing: sitting/lateral leans;with supervision Pt Will Transfer to Toilet: with supervision;with transfer board;bedside commode Pt Will Perform Tub/Shower Transfer: Tub transfer;Stand pivot transfer;with min assist;tub bench Pt/caregiver will Perform Home Exercise Program: Increased ROM;Increased strength;Both right and left upper extremity;With written HEP provided;With theraband;Independently Additional ADL Goal #1: Patient will maintain NWB on RLE  and spinal precautions throughout ADLs. Additional ADL Goal #2: Patient will be able to dictate what he needs (chair set up, commode etc) to promote increased carry over with functional independence with transfers.  Plan Discharge plan remains appropriate    Co-evaluation                 AM-PAC OT "6 Clicks" Daily Activity     Outcome Measure   Help from another person eating meals?: None Help from another person taking care of personal grooming?: A Little Help from another person toileting, which includes using toliet, bedpan, or urinal?: A Lot Help from another person bathing (including washing, rinsing, drying)?: A Lot Help from another person to put on and taking off regular upper body clothing?: A Little Help from another person to put on and taking off regular lower body clothing?: A Lot 6 Click Score: 16    End of Session    OT Visit Diagnosis: Unsteadiness on feet (R26.81);Muscle weakness (generalized) (M62.81);Pain;Other symptoms and signs involving cognitive function;Other abnormalities of gait and mobility (R26.89) Pain - Right/Left: Right Pain - part of body: Leg   Activity Tolerance Patient tolerated treatment well   Patient Left in chair;with call bell/phone within reach;with chair alarm set   Nurse Communication Mobility status  Time: 1025-1055 OT Time Calculation (min): 30 min  Charges: OT General Charges $OT Visit: 1 Visit OT Treatments $Self Care/Home Management : 8-22 mins $Therapeutic Exercise: 8-22 mins  Lodema Hong, OTA Acute Rehabilitation Services  Office [phone]   Trixie Dredge 01/15/2023, 1:32 PM

## 2023-01-15 NOTE — Progress Notes (Signed)
Physical Therapy Treatment Patient Details Name: Hunter Reyes MRN: [id number] DOB: 02/07/1976 Today's Date: 01/15/2023   History of Present Illness 47 y.o. male admitted 12/10 with AMS, urinary retention and low back pain. Underwent L4 lumbar laminectomy for epidural abscess 12/13. s/p R foot transmet amp 12/17. Pt initiated HD on 12/26. PMH:  bipolar 1 disorder, borderline personality disorder, COPD, crack cocaine abuse, depression, peripheral neuropathy, history of right foot osteomyelitis, panic attack and history of seizures    PT Comments    Pt admitted with above diagnosis. Pt was able to transfer several times with min guard assist and cues for technique and to maintain NWB right LE. Pt needs constant cues for recalling to not place weight on right foot. Pt continues to need assist but is progressing.  Pt currently with functional limitations due to balance and endurance deficits. Pt will benefit from skilled PT to increase their independence and safety with mobility to allow discharge to the venue listed below.      Recommendations for follow up therapy are one component of a multi-disciplinary discharge planning process, led by the attending physician.  Recommendations may be updated based on patient status, additional functional criteria and insurance authorization.  Follow Up Recommendations  Skilled nursing-short term rehab (<3 hours/day) Can patient physically be transported by private vehicle: No   Assistance Recommended at Discharge Frequent or constant Supervision/Assistance  Patient can return home with the following Assistance with cooking/housework;Assist for transportation;Help with stairs or ramp for entrance;A lot of help with walking and/or transfers;A little help with bathing/dressing/bathroom   Equipment Recommendations  Rolling walker (2 wheels);Wheelchair (measurements PT)    Recommendations for Other Services       Precautions / Restrictions  Precautions Precautions: Fall;Back Precaution Booklet Issued: No Precaution Comments: watch HR, incontinent stool Required Braces or Orthoses: Other Brace Other Brace: post op shoe- not present during session Restrictions Weight Bearing Restrictions: Yes RLE Weight Bearing: Weight bearing as tolerated     Mobility  Bed Mobility Overal bed mobility: Needs Assistance Bed Mobility: Supine to Sit Rolling: Supervision Sidelying to sit: Supervision Supine to sit: Supervision Sit to supine: Supervision Sit to sidelying: Supervision General bed mobility comments: patient able to get to EOB  without assistance    Transfers Overall transfer level: Needs assistance Equipment used: None Transfers: Bed to chair/wheelchair/BSC            Lateral/Scoot Transfers: Min guard, Min assist General transfer comment: No assist given with cues only to maintain NWB right LE. Pt scooted to drop arm commode. Had BM. PT cleaned pt with total assist as he stated he could not do it. Pt then transferred to bed and then to drop arm commode. Still needs constant cues to maintain NWB right LE.    Ambulation/Gait               General Gait Details: unable to safely attempt   Stairs             Wheelchair Mobility    Modified Rankin (Stroke Patients Only)       Balance Overall balance assessment: Needs assistance Sitting-balance support: No upper extremity supported, Feet supported Sitting balance-[name] Scale: Good                                      Cognition Arousal/Alertness: Awake/alert Behavior During Therapy: WFL for tasks assessed/performed Overall Cognitive Status: No family/caregiver  present to determine baseline cognitive functioning Area of Impairment: Attention, Problem solving, Memory, Following commands, Safety/judgement, Awareness                 Orientation Level: Disoriented to, Time Current Attention Level: Sustained Memory: Decreased  recall of precautions Following Commands: Follows one step commands with increased time Safety/Judgement: Decreased awareness of safety Awareness: Emergent Problem Solving: Slow processing General Comments: Pt did better with maintaining NWB with less cues        Exercises General Exercises - Upper Extremity Shoulder Flexion: Strengthening, Both, 10 reps, Theraband Theraband Level (Shoulder Flexion): Level 2 (Red) Shoulder ABduction: Strengthening, Both, 10 reps, Theraband Theraband Level (Shoulder Abduction): Level 2 (Red) Shoulder Horizontal ADduction: AROM, Both, 10 reps, Seated Elbow Flexion: AROM, Both, 10 reps, Seated Elbow Extension: AROM, Both, 10 reps, Seated General Exercises - Lower Extremity Ankle Circles/Pumps: AROM, Both, 10 reps Quad Sets: AROM, Both, 10 reps, Supine Long Arc Quad: AROM, Both, Seated, 20 reps Straight Leg Raises: AROM, Both, 10 reps, Seated Hip Flexion/Marching: AROM, Both, 20 reps, Seated Other Exercises Other Exercises: chair push ups requested of pt with pt lifting  buttocks off of chair minimally x 2. Pt does not appear to give alot of effort.    General Comments        Pertinent Vitals/Pain Pain Assessment Pain Assessment: Faces Faces Pain Scale: Hurts whole lot Pain Location: right foot and back Pain Descriptors / Indicators: Burning, Aching, Grimacing, Guarding Pain Intervention(s): Limited activity within patient's tolerance, Monitored during session, Repositioned, Patient requesting pain meds-RN notified    Home Living                          Prior Function            PT Goals (current goals can now be found in the care plan section) Acute Rehab PT Goals Patient Stated Goal: home Progress towards PT goals: Progressing toward goals    Frequency    Min 2X/week      PT Plan Current plan remains appropriate    Co-evaluation              AM-PAC PT "6 Clicks" Mobility   Outcome Measure  Help needed  turning from your back to your side while in a flat bed without using bedrails?: None Help needed moving from lying on your back to sitting on the side of a flat bed without using bedrails?: None Help needed moving to and from a bed to a chair (including a wheelchair)?: None Help needed standing up from a chair using your arms (e.g., wheelchair or bedside chair)?: Total Help needed to walk in hospital room?: Total Help needed climbing 3-5 steps with a railing? : Total 6 Click Score: 15    End of Session Equipment Utilized During Treatment: Gait belt Activity Tolerance: Patient limited by fatigue Patient left: with call bell/phone within reach;in chair;with chair alarm set Nurse Communication: Mobility status PT Visit Diagnosis: Muscle weakness (generalized) (M62.81);Pain;Other abnormalities of gait and mobility (R26.89) Pain - Right/Left: Right Pain - part of body:  (back)     Time: 1610-9604 PT Time Calculation (min) (ACUTE ONLY): 36 min  Charges:  $Therapeutic Exercise: 8-22 mins $Therapeutic Activity: 8-22 mins                     [facility] M,PT Acute Rehab Services [phone]    Alvira Philips 01/15/2023, 12:00 PM

## 2023-01-15 NOTE — Progress Notes (Signed)
Orthopedic Tech Progress Note Patient Details:  Hunter Reyes Jun 22, 1976 [id number]  Ortho Devices Type of Ortho Device: Postop shoe/boot Ortho Device/Splint Location: RLE Ortho Device/Splint Interventions: Ordered, Application, Adjustment, Removal   Post Interventions Patient Tolerated: Well Instructions Provided: Care of device  Janit Pagan 01/15/2023, 2:47 PM

## 2023-01-16 DIAGNOSIS — B9562 Methicillin resistant Staphylococcus aureus infection as the cause of diseases classified elsewhere: Secondary | ICD-10-CM | POA: Diagnosis not present

## 2023-01-16 DIAGNOSIS — R7881 Bacteremia: Secondary | ICD-10-CM | POA: Diagnosis not present

## 2023-01-16 LAB — CBC
HCT: 22.7 % — ABNORMAL LOW (ref 39.0–52.0)
Hemoglobin: 7.4 g/dL — ABNORMAL LOW (ref 13.0–17.0)
MCH: 28.5 pg (ref 26.0–34.0)
MCHC: 32.6 g/dL (ref 30.0–36.0)
MCV: 87.3 fL (ref 80.0–100.0)
Platelets: 267 10*3/uL (ref 150–400)
RBC: 2.6 MIL/uL — ABNORMAL LOW (ref 4.22–5.81)
RDW: 14.8 % (ref 11.5–15.5)
WBC: 9.1 10*3/uL (ref 4.0–10.5)
nRBC: 0 % (ref 0.0–0.2)

## 2023-01-16 LAB — BASIC METABOLIC PANEL
Anion gap: 13 (ref 5–15)
BUN: 55 mg/dL — ABNORMAL HIGH (ref 6–20)
CO2: 22 mmol/L (ref 22–32)
Calcium: 8.9 mg/dL (ref 8.9–10.3)
Chloride: 101 mmol/L (ref 98–111)
Creatinine, Ser: 5.27 mg/dL — ABNORMAL HIGH (ref 0.61–1.24)
GFR, Estimated: 13 mL/min — ABNORMAL LOW (ref >60)
Glucose, Bld: 91 mg/dL (ref 70–99)
Potassium: 3.5 mmol/L (ref 3.5–5.1)
Sodium: 136 mmol/L (ref 135–145)

## 2023-01-16 MED ORDER — SENNOSIDES-DOCUSATE SODIUM 8.6-50 MG PO TABS
1.0000 | ORAL_TABLET | Freq: Two times a day (BID) | ORAL | Status: DC
  Administered 2023-01-16 – 2023-02-03 (×34): 1 via ORAL
  Filled 2023-01-16 (×34): qty 1

## 2023-01-16 NOTE — Progress Notes (Signed)
Mobility Specialist Progress Note   01/16/23 1445  Mobility  Activity Stood at bedside;Dangled on edge of bed;Ambulated with assistance in room (STS X3; Side steps along side bed)  Level of Assistance Modified independent, requires aide device or extra time  Assistive Device Front wheel walker  Distance Ambulated (ft) 3 ft  Range of Motion/Exercises Active;All extremities  RLE Weight Bearing WBAT  Activity Response Tolerated well   Patient received in supine, eager and motivated to participate. Was independent for bed mobility and stood mod I to supervision level with cues for hand placement. Completed x3 trial of STS with seated breaks in between. Did complain of dizziness with the initial stand but improved with rest. Was able to weight shift and take steps alongside bed. Was limited by pain in R ankle/knee, otherwise patient tolerated WBAT without incident. Returned to supine and was left with all needs met, call bell in reach.   Hunter Reyes, BS EXP Mobility Specialist Please contact via SecureChat or Rehab office at [phone]

## 2023-01-16 NOTE — Progress Notes (Addendum)
[facility] PROGRESS NOTE  Hunter Reyes (DOB: [birth year]) [id number] PCP: Celene Squibb, MD  Brief Narrative: Hunter Reyes is a 47 y.o. male with a PMH of bipolar disorder, polysubstance abuse, COPD, chronic hepatitis B, and peripheral neuropathy who was admitted 12/02/2022 with acute renal failure with obstructive uropathy. Foley catheter was placed and the patient required dialysis intermittently up until 12/25/2022. Also found to have L4 epidural abscess with subacute symptoms of cauda equina syndrome necessitating a right L4 laminectomy and debridement 12/05/2022. Blood cultures grew MRSA. Additionally found to have abscess and osteomyelitis of the right foot for which transmetatarsal amputation was performed 12/09/2022. He continues on daptomycin with end date 02/03/2023 at which time he will need repeat lumbar spine imaging. Creatinine remains in the 4-5 range though with adequate output, not requiring dialysis.   Subjective: Put SCDs on when heparin discontinued. No chest pain, dyspnea, or bleeding x24 hrs. Trimmed nails at bedside this morning. Having hard dark stools.  Objective: BP 98/67 (BP Location: Right Arm)   Pulse 70   Temp 98.3 F (36.8 C) (Oral)   Resp 16   Ht 6\' 2"  (1.88 m)   Wt 134.1 kg   SpO2 98%   BMI 37.96 kg/m   Gen: No distress Pulm: Clear, nonlabored CV: RRR, no MRG  GI: Soft, NT, ND, +BS  Neuro: Alert and oriented. No new focal deficits. Ext: Warm, well perfused, R TMA. Skin: No new rashes, lesions or ulcers on visualized skin   Assessment & Plan: MRSA bacteremia complicated by K4-Y1 discitis with epidural abscess s/p decompression/debridement 12/05/22: - Continue daptomycin with end date 2/11. Repeat MRI on 1/8 did not show full resolution of diskitis. Will need repeat MRI of Lumbar Spine W WO contrast toward the end of therapy. - Check CK and labs on Mondays.      Right Foot Abscess and Osteomyelitis s/p 12/09/22 TMA: Margins were  reportedly clear. - Continue antibiotics as above per ID - Will reach out to Dr. Sharol Given Re: weight bearing restrictions at this time to facilitate PT.   AKI on stage IV CKD: CrCl stabilized in 15-30ml/min range with SCr in 4-5 range. UOP adequate and electrolytes stable.  - Avoid nephrotoxins, renally dose appropriate medications, monitor CrCl.   Hypokalemia: Resolved.   Chronic Normocytic Anemia: 12/17/22 received 1 unit pRBC. - Monitoring regularly   Epistaxis: Seems to be sporadic, but recurrent today. - Trim fingernails. This was done 1/24. - With recurrent bleeding, will hold heparin VTE ppx for now. SCD applied. - Recheck CBC regularly. Remains stable. T&S UTD. - Continue po iron (suspected cause of constipation and darkened stools - augmenting bowel regimen and will monitor).   Bipolar Disorder: - Mood is stable on zoloft.   Polysubstance abuse, OUD: Hx crack cocaine use. No current evidence of withdrawal - Continue home suboxone. - Cessation counseling provided  Patrecia Pour, MD [facility] [email or web address] 01/16/2023, 8:17 AM

## 2023-01-16 NOTE — Progress Notes (Signed)
   01/16/23 0800  Restrictions  RLE Weight Bearing WBAT  Other Position/Activity Restrictions with post op shoe- updated weight bearing per Dr. Sharol Given on 01/15/23   Obtained updated weight bearing status for pt and updating chart accordingly.   Alfrieda Tarry M,PT Acute Rehab Services [phone]

## 2023-01-16 NOTE — Plan of Care (Signed)
  Problem: Fluid Volume: Goal: Hemodynamic stability will improve Outcome: Progressing   Problem: Clinical Measurements: Goal: Signs and symptoms of infection will decrease Outcome: Progressing   Problem: Education: Goal: Knowledge of General Education information will improve Description: Including pain rating scale, medication(s)/side effects and non-pharmacologic comfort measures Outcome: Progressing   Problem: Clinical Measurements: Goal: Ability to maintain clinical measurements within normal limits will improve Outcome: Progressing Goal: Will remain free from infection Outcome: Progressing   Problem: Activity: Goal: Risk for activity intolerance will decrease Outcome: Progressing   Problem: Pain Managment: Goal: General experience of comfort will improve Outcome: Progressing   Problem: Safety: Goal: Ability to remain free from injury will improve Outcome: Progressing   Problem: Skin Integrity: Goal: Risk for impaired skin integrity will decrease Outcome: Progressing

## 2023-01-17 DIAGNOSIS — R7881 Bacteremia: Secondary | ICD-10-CM | POA: Diagnosis not present

## 2023-01-17 DIAGNOSIS — B9562 Methicillin resistant Staphylococcus aureus infection as the cause of diseases classified elsewhere: Secondary | ICD-10-CM | POA: Diagnosis not present

## 2023-01-17 NOTE — Progress Notes (Signed)
[facility] PROGRESS NOTE  BOWMAN HIGBIE (DOB: [birth year]) [id number] PCP: Celene Squibb, MD  Brief Narrative: Hunter Reyes is a 47 y.o. male with a PMH of bipolar disorder, polysubstance abuse, COPD, chronic hepatitis B, and peripheral neuropathy who was admitted 12/02/2022 with acute renal failure with obstructive uropathy. Foley catheter was placed and the patient required dialysis intermittently up until 12/25/2022. Also found to have L4 epidural abscess with subacute symptoms of cauda equina syndrome necessitating a right L4 laminectomy and debridement 12/05/2022. Blood cultures grew MRSA. Additionally found to have abscess and osteomyelitis of the right foot for which transmetatarsal amputation was performed 12/09/2022. He continues on daptomycin with end date 02/03/2023 at which time he will need repeat lumbar spine imaging. Creatinine remains in the 4-5 range though with adequate output, not requiring dialysis.   Subjective: No new complaints, no further nosebleed.  Objective: BP 99/66 (BP Location: Right Arm)   Pulse 79   Temp 97.6 F (36.4 C) (Oral)   Resp 18   Ht 6\' 2"  (1.88 m)   Wt 134.1 kg   SpO2 99%   BMI 37.96 kg/m   Gen: No distress Pulm: Nonlabored  CV: Regular, no pitting edema noted GI: Soft, NT, ND, +BS  Neuro: Alert and oriented. No new focal deficits. Skin: No rashes, lesions or ulcers on visualized skin   Assessment & Plan: MRSA bacteremia complicated by K0-U5 discitis with epidural abscess s/p decompression/debridement 12/05/22: - Continue daptomycin with end date 2/11. Repeat MRI on 1/8 did not show full resolution of diskitis. Will need repeat MRI of Lumbar Spine W WO contrast toward the end of therapy. - Check CK and labs on Mondays.      Right Foot Abscess and Osteomyelitis s/p 12/09/22 TMA: Margins were reportedly clear. - Continue antibiotics as above per ID - WBAT in postop shoe per Dr. Sharol Given.   AKI on stage IV CKD: CrCl stabilized in  15-72ml/min range with SCr in 4-5 range. UOP adequate and electrolytes stable.  - Avoid nephrotoxins, renally dose appropriate medications, monitor CrCl.   Hypokalemia: Resolved.   Chronic Normocytic Anemia: 12/17/22 received 1 unit pRBC. - Monitoring regularly   Epistaxis: Seems to be sporadic, improved with trimming fingernails. - SCD applied. If hgb stable and no further bleeding, restart heparin 5k [place] q8h. - Recheck CBC in AM - Continue po iron (suspected cause of constipation and darkened stools - augmenting bowel regimen and will monitor).   Bipolar Disorder: - Mood is stable on zoloft.   Polysubstance abuse, OUD: Hx crack cocaine use. No current evidence of withdrawal - Continue home suboxone. - Cessation counseling provided  Patrecia Pour, MD [facility] [email or web address] 01/17/2023, 9:33 AM

## 2023-01-17 NOTE — Progress Notes (Signed)
Physical Therapy Treatment Patient Details Name: Hunter Reyes MRN: [id number] DOB: 1976/08/04 Today's Date: 01/17/2023   History of Present Illness 47 y.o. male admitted 12/10 with AMS, urinary retention and low back pain. Underwent L4 lumbar laminectomy for epidural abscess 12/13. s/p R foot transmet amp 12/17. Pt initiated HD on 12/26. PMH:  bipolar 1 disorder, borderline personality disorder, COPD, crack cocaine abuse, depression, peripheral neuropathy, history of right foot osteomyelitis, panic attack and history of seizures    PT Comments    Pt admitted with above diagnosis. Pt was able to take pivotal steps to chair with min to mod assist with mod assist to stand and min assist to move RW and pivot to chair. Pt initiated ambulation with RW as well with some left LE buckling noted. Will need chair follow. Called [employer] to come and replace post op shoe with a smaller shoe as the one they brought is too large.  Pt currently with functional limitations due to balance and endurance deficits. Pt will benefit from skilled PT to increase their independence and safety with mobility to allow discharge to the venue listed below.      Recommendations for follow up therapy are one component of a multi-disciplinary discharge planning process, led by the attending physician.  Recommendations may be updated based on patient status, additional functional criteria and insurance authorization.  Follow Up Recommendations  Skilled nursing-short term rehab (<3 hours/day) Can patient physically be transported by private vehicle: No   Assistance Recommended at Discharge Frequent or constant Supervision/Assistance  Patient can return home with the following Assistance with cooking/housework;Assist for transportation;Help with stairs or ramp for entrance;A lot of help with walking and/or transfers;A little help with bathing/dressing/bathroom   Equipment Recommendations  Rolling walker (2 wheels);Wheelchair  (measurements PT)    Recommendations for Other Services       Precautions / Restrictions Precautions Precautions: Fall;Back Precaution Booklet Issued: No Precaution Comments: watch HR, incontinent stool Required Braces or Orthoses: Other Brace Other Brace: post op shoe Restrictions Weight Bearing Restrictions: Yes RLE Weight Bearing: Weight bearing as tolerated Other Position/Activity Restrictions: with post op shoe- updated weight bearing per Dr. Sharol Given on 01/15/23     Mobility  Bed Mobility Overal bed mobility: Needs Assistance Bed Mobility: Supine to Sit Rolling: Supervision Sidelying to sit: Supervision Supine to sit: Supervision Sit to supine: Supervision Sit to sidelying: Supervision General bed mobility comments: Total assist to don post op shoe on right Le. Called [employer] as size of post op shoe that they brought is way too big.    Transfers Overall transfer level: Needs assistance Equipment used: None Transfers: Sit to/from Stand, Bed to chair/wheelchair/BSC Sit to Stand: Mod assist, Min assist, From elevated surface Stand pivot transfers: Min assist         General transfer comment: Pt stood to RW needing cues for hand placement and some assist to rise.  Pivoted to recliner with min assist and use of RW with pt able to take pivotal steps.    Ambulation/Gait Ambulation/Gait assistance: Min assist, Mod assist, +2 safety/equipment Gait Distance (Feet): 12 Feet (4 feet forward and back) Assistive device: Rolling walker (2 wheels) Gait Pattern/deviations: Step-through pattern, Decreased stride length, Trunk flexed, Knees buckling   Gait velocity interpretation: <1.31 ft/sec, indicative of household ambulator   General Gait Details: Pt was able to take 4 steps forward and back x 3 times with RW and on last time back, noted left knee buckling of which pt was steadied and he  did attempt to self correct as well. Will need +2 to progress ambulation.   Stairs              Wheelchair Mobility    Modified Rankin (Stroke Patients Only)       Balance Overall balance assessment: Needs assistance Sitting-balance support: No upper extremity supported, Feet supported Sitting balance-[name] Scale: Good     Standing balance support: During functional activity, Bilateral upper extremity supported Standing balance-[name] Scale: Poor Standing balance comment: bil Ue support in standing on RW with min assit for static stance and min to mod assist for dynamic balance                            Cognition Arousal/Alertness: Awake/alert Behavior During Therapy: WFL for tasks assessed/performed Overall Cognitive Status: No family/caregiver present to determine baseline cognitive functioning Area of Impairment: Attention, Problem solving, Memory, Following commands, Safety/judgement, Awareness                 Orientation Level: Disoriented to, Time Current Attention Level: Sustained Memory: Decreased recall of precautions Following Commands: Follows one step commands with increased time Safety/Judgement: Decreased awareness of safety Awareness: Emergent Problem Solving: Slow processing          Exercises General Exercises - Lower Extremity Ankle Circles/Pumps: AROM, Both, 10 reps Quad Sets: AROM, Both, 10 reps, Supine Long Arc Quad: AROM, Both, Seated, 20 reps Hip Flexion/Marching: AROM, Both, 20 reps, Seated    General Comments        Pertinent Vitals/Pain Pain Assessment Pain Assessment: Faces Faces Pain Scale: Hurts whole lot Pain Location: right foot and back Pain Descriptors / Indicators: Burning, Aching, Grimacing, Guarding Pain Intervention(s): Limited activity within patient's tolerance, Monitored during session, Repositioned, Heat applied    Home Living                          Prior Function            PT Goals (current goals can now be found in the care plan section) Progress towards PT  goals: Progressing toward goals    Frequency    Min 2X/week      PT Plan Current plan remains appropriate    Co-evaluation              AM-PAC PT "6 Clicks" Mobility   Outcome Measure  Help needed turning from your back to your side while in a flat bed without using bedrails?: None Help needed moving from lying on your back to sitting on the side of a flat bed without using bedrails?: None Help needed moving to and from a bed to a chair (including a wheelchair)?: A Lot Help needed standing up from a chair using your arms (e.g., wheelchair or bedside chair)?: A Lot Help needed to walk in hospital room?: Total Help needed climbing 3-5 steps with a railing? : Total 6 Click Score: 14    End of Session Equipment Utilized During Treatment: Gait belt Activity Tolerance: Patient limited by fatigue Patient left: with call bell/phone within reach;in chair;with chair alarm set Nurse Communication: Mobility status PT Visit Diagnosis: Muscle weakness (generalized) (M62.81);Pain;Other abnormalities of gait and mobility (R26.89) Pain - Right/Left: Right Pain - part of body:  (back)     Time: 6761-9509 PT Time Calculation (min) (ACUTE ONLY): 24 min  Charges:  $Gait Training: 8-22 mins $Therapeutic Exercise: 8-22 mins  [facility] M,PT Acute Rehab Services [place] 01/17/2023, 1:23 PM

## 2023-01-17 NOTE — Plan of Care (Signed)
  Problem: Clinical Measurements: Goal: Signs and symptoms of infection will decrease Outcome: Progressing   Problem: Clinical Measurements: Goal: Will remain free from infection Outcome: Progressing   Problem: Activity: Goal: Risk for activity intolerance will decrease Outcome: Progressing   Problem: Pain Managment: Goal: General experience of comfort will improve Outcome: Progressing   Problem: Safety: Goal: Ability to remain free from injury will improve Outcome: Progressing   Problem: Skin Integrity: Goal: Risk for impaired skin integrity will decrease Outcome: Progressing

## 2023-01-18 DIAGNOSIS — B9562 Methicillin resistant Staphylococcus aureus infection as the cause of diseases classified elsewhere: Secondary | ICD-10-CM | POA: Diagnosis not present

## 2023-01-18 DIAGNOSIS — R7881 Bacteremia: Secondary | ICD-10-CM | POA: Diagnosis not present

## 2023-01-18 LAB — CK: Total CK: 76 U/L (ref 49–397)

## 2023-01-18 NOTE — Progress Notes (Signed)
[facility] PROGRESS NOTE  Hunter Reyes (DOB: 1975-12-29) PNT:[id number] PCP: Celene Squibb, MD  Brief Narrative: Hunter Reyes is a 47 y.o. male with a PMH of bipolar disorder, polysubstance abuse, COPD, chronic hepatitis B, and peripheral neuropathy who was admitted 12/02/2022 with acute renal failure with obstructive uropathy. Foley catheter was placed and the patient required dialysis intermittently up until 12/25/2022. Also found to have L4 epidural abscess with subacute symptoms of cauda equina syndrome necessitating a right L4 laminectomy and debridement 12/05/2022. Blood cultures grew MRSA. Additionally found to have abscess and osteomyelitis of the right foot for which transmetatarsal amputation was performed 12/09/2022. He continues on daptomycin with end date 02/03/2023 at which time he will need repeat lumbar spine imaging. Creatinine remains in the 4-5 range though with adequate output, not requiring dialysis.   Subjective: No new complaints, issues or events noted.  Objective: BP 96/80 (BP Location: Right Arm)   Pulse 93   Temp 98.6 F (37 C)   Resp 17   Ht 6\' 2"  (1.88 m)   Wt 134.1 kg   SpO2 100%   BMI 37.96 kg/m   Gen: No distress Pulm: Nonlabored  CV: Regular, no pitting edema noted GI: Soft, NT, ND, +BS  Neuro: Alert and oriented. No new focal deficits. Skin: No rashes, lesions or ulcers on visualized skin, R foot bandage clean/dry/intact.  Assessment & Plan:  MRSA bacteremia complicated by G8-Q7 discitis with epidural abscess s/p decompression/debridement 12/05/22: - Continue daptomycin with end date 2/11. Repeat MRI on 1/8 did not show full resolution of diskitis. Will need repeat MRI of Lumbar Spine W WO contrast toward the end of therapy. - Check CK and labs on Mondays.      Right Foot Abscess and Osteomyelitis s/p 12/09/22 TMA:  - Margins were reportedly clear. - Continue antibiotics as above per ID through 02/03/23 - WBAT in postop shoe per Dr.  Sharol Given.   AKI on stage IV CKD:  - CrCl stabilized in 15-30ml/min range with SCr in 4-5 range. UOP adequate and electrolytes stable.  - Avoid nephrotoxins, renally dose appropriate medications, monitor CrCl.   Hypokalemia: Resolved.   Chronic Normocytic Anemia:  -Likely in the setting of CKD as above and acute infection -12/17/22 received 1 unit pRBC. - Monitoring regularly   Epistaxis:  - Seems to be self-inflicted, improved with trimming fingernails. - SCD applied.  Recommend early ambulation, will hold chemical prophylaxis in the setting of previous bleed. - Recheck CBC - Continue po iron (suspected cause of constipation and darkened stools - augmenting bowel regimen and will monitor).   Bipolar Disorder: - Mood is stable on zoloft.   Polysubstance abuse, OUD: Hx crack cocaine use. No current evidence of withdrawal - Continue home suboxone. - Cessation counseling provided  [name], DO [facility] [email or web address] 01/18/2023, 6:56 AM

## 2023-01-18 NOTE — Progress Notes (Signed)
Occupational Therapy Treatment Patient Details Name: Hunter Reyes MRN: [id number] DOB: Sep 23, 1976 Today's Date: 01/18/2023   History of present illness 47 y.o. male admitted 12/10 with AMS, urinary retention and low back pain. Underwent L4 lumbar laminectomy for epidural abscess 12/13. s/p R foot transmet amp 12/17. Pt initiated HD on 12/26. PMH:  bipolar 1 disorder, borderline personality disorder, COPD, crack cocaine abuse, depression, peripheral neuropathy, history of right foot osteomyelitis, panic attack and history of seizures   OT comments  Patient able to get to EOB and required total assist to donn post op shoe on RLE. Patient now WBAT on RLE and is able to stand for cleaning bottom and transferred to recliner with mod assist. Patient continues to require occasional verbal cues to perform UE HEP. Patient is making good gains with increased WB to RLE and will attempt to perform self care standing at sink next session. Acute OT to continue to follow with discharge recommendations continue to be appropriate.    Recommendations for follow up therapy are one component of a multi-disciplinary discharge planning process, led by the attending physician.  Recommendations may be updated based on patient status, additional functional criteria and insurance authorization.    Follow Up Recommendations  Skilled nursing-short term rehab (<3 hours/day)     Assistance Recommended at Discharge Intermittent Supervision/Assistance  Patient can return home with the following  A lot of help with bathing/dressing/bathroom;Assistance with cooking/housework;Help with stairs or ramp for entrance;Assist for transportation;A lot of help with walking and/or transfers   Equipment Recommendations  Wheelchair (measurements OT);Wheelchair cushion (measurements OT)    Recommendations for Other Services      Precautions / Restrictions Precautions Precautions: Fall;Back Precaution Booklet Issued:  No Precaution Comments: watch HR, incontinent stool Required Braces or Orthoses: Other Brace Other Brace: post op shoe Restrictions Weight Bearing Restrictions: Yes (Simultaneous filing. User may not have seen previous data.) RLE Weight Bearing: Weight bearing as tolerated (Simultaneous filing. User may not have seen previous data.) Other Position/Activity Restrictions: with post op shoe- updated weight bearing per Dr. Sharol Given on 01/15/23       Mobility Bed Mobility Overal bed mobility: Needs Assistance Bed Mobility: Supine to Sit     Supine to sit: Supervision     General bed mobility comments: able to get to EOB with rail use    Transfers Overall transfer level: Needs assistance Equipment used: Rolling walker (2 wheels) Transfers: Sit to/from Stand, Bed to chair/wheelchair/BSC Sit to Stand: Mod assist, Min assist, From elevated surface     Step pivot transfers: Mod assist     General transfer comment: mod assist for balance and with cues for safety and hand placement     Balance Overall balance assessment: Needs assistance Sitting-balance support: No upper extremity supported, Feet supported Sitting balance-[name] Scale: Good     Standing balance support: During functional activity, Bilateral upper extremity supported Standing balance-[name] Scale: Poor Standing balance comment: reliant on UE support when standing                           ADL either performed or assessed with clinical judgement   ADL Overall ADL's : Needs assistance/impaired     Grooming: Wash/dry hands;Wash/dry face;Oral care;Set up;Sitting Grooming Details (indicate cue type and reason): in recliner     Lower Body Bathing: Moderate assistance;Sitting/lateral leans;Sit to/from stand Lower Body Bathing Details (indicate cue type and reason): requires max assist for cleaning bottom while standing due to  reliant on UE support     Lower Body Dressing: Total assistance;Sitting/lateral  leans Lower Body Dressing Details (indicate cue type and reason): to donn post op shoe               General ADL Comments: increased WB to RLE, will progress towards performing self care at sink    Extremity/Trunk Assessment              Vision       Perception     Praxis      Cognition Arousal/Alertness: Awake/alert Behavior During Therapy: WFL for tasks assessed/performed Overall Cognitive Status: No family/caregiver present to determine baseline cognitive functioning Area of Impairment: Attention, Problem solving, Memory, Following commands, Safety/judgement, Awareness                 Orientation Level: Disoriented to, Time Current Attention Level: Sustained Memory: Decreased recall of precautions Following Commands: Follows one step commands with increased time Safety/Judgement: Decreased awareness of safety Awareness: Emergent Problem Solving: Slow processing General Comments: aware of WB change        Exercises Exercises: General Upper Extremity General Exercises - Upper Extremity Shoulder Flexion: Strengthening, Both, 10 reps, Theraband Theraband Level (Shoulder Flexion): Level 2 (Red) Shoulder ABduction: Strengthening, Both, 10 reps, Theraband Theraband Level (Shoulder Abduction): Level 2 (Red) Shoulder Horizontal ABduction: Strengthening, 10 reps, Seated, Theraband Theraband Level (Shoulder Horizontal Abduction): Level 2 (Red) Elbow Flexion: Both, 10 reps, Seated, Strengthening, Theraband Theraband Level (Elbow Flexion): Level 2 (Red) Elbow Extension: Strengthening, Both, 10 reps, Seated, Theraband Theraband Level (Elbow Extension): Level 2 (Red)    Shoulder Instructions       General Comments      Pertinent Vitals/ Pain       Pain Assessment Pain Assessment: Faces Faces Pain Scale: Hurts even more Pain Location: right foot and back Pain Descriptors / Indicators: Burning, Aching, Grimacing, Guarding Pain Intervention(s): Limited  activity within patient's tolerance, Monitored during session, Repositioned  Home Living                                          Prior Functioning/Environment              Frequency  Min 2X/week        Progress Toward Goals  OT Goals(current goals can now be found in the care plan section)  Progress towards OT goals: Progressing toward goals  Acute Rehab OT Goals Patient Stated Goal: get better OT Goal Formulation: With patient Time For Goal Achievement: 01/25/23 Potential to Achieve Goals: Fair ADL Goals Pt Will Perform Grooming: sitting;with set-up Pt Will Perform Lower Body Dressing: sitting/lateral leans;with supervision Pt Will Transfer to Toilet: with supervision;with transfer board;bedside commode Pt Will Perform Tub/Shower Transfer: Tub transfer;Stand pivot transfer;with min assist;tub bench Pt/caregiver will Perform Home Exercise Program: Increased ROM;Increased strength;Both right and left upper extremity;With written HEP provided;With theraband;Independently Additional ADL Goal #1: Patient will maintain NWB on RLE and spinal precautions throughout ADLs. Additional ADL Goal #2: Patient will be able to dictate what he needs (chair set up, commode etc) to promote increased carry over with functional independence with transfers.  Plan Discharge plan remains appropriate    Co-evaluation                 AM-PAC OT "6 Clicks" Daily Activity     Outcome Measure   Help from another person  eating meals?: None Help from another person taking care of personal grooming?: A Little Help from another person toileting, which includes using toliet, bedpan, or urinal?: A Lot Help from another person bathing (including washing, rinsing, drying)?: A Lot Help from another person to put on and taking off regular upper body clothing?: A Little Help from another person to put on and taking off regular lower body clothing?: A Lot 6 Click Score: 16    End  of Session Equipment Utilized During Treatment: Rolling walker (2 wheels);Gait belt  OT Visit Diagnosis: Unsteadiness on feet (R26.81);Muscle weakness (generalized) (M62.81);Pain;Other symptoms and signs involving cognitive function;Other abnormalities of gait and mobility (R26.89) Pain - Right/Left: Right Pain - part of body: Leg   Activity Tolerance Patient tolerated treatment well   Patient Left in chair;with call bell/phone within reach;with chair alarm set   Nurse Communication Mobility status        Time: 7425-9563 OT Time Calculation (min): 36 min  Charges: OT General Charges $OT Visit: 1 Visit OT Treatments $Self Care/Home Management : 8-22 mins $Therapeutic Exercise: 8-22 mins  Lodema Hong, [place]  [facility] [place] 01/18/2023, 1:56 PM

## 2023-01-19 DIAGNOSIS — R7881 Bacteremia: Secondary | ICD-10-CM | POA: Diagnosis not present

## 2023-01-19 DIAGNOSIS — B9562 Methicillin resistant Staphylococcus aureus infection as the cause of diseases classified elsewhere: Secondary | ICD-10-CM | POA: Diagnosis not present

## 2023-01-19 LAB — BASIC METABOLIC PANEL
Anion gap: 12 (ref 5–15)
BUN: 60 mg/dL — ABNORMAL HIGH (ref 6–20)
CO2: 21 mmol/L — ABNORMAL LOW (ref 22–32)
Calcium: 9.2 mg/dL (ref 8.9–10.3)
Chloride: 101 mmol/L (ref 98–111)
Creatinine, Ser: 5.31 mg/dL — ABNORMAL HIGH (ref 0.61–1.24)
GFR, Estimated: 13 mL/min — ABNORMAL LOW (ref >60)
Glucose, Bld: 95 mg/dL (ref 70–99)
Potassium: 3.7 mmol/L (ref 3.5–5.1)
Sodium: 134 mmol/L — ABNORMAL LOW (ref 135–145)

## 2023-01-19 LAB — CBC
HCT: 23.2 % — ABNORMAL LOW (ref 39.0–52.0)
Hemoglobin: 7.5 g/dL — ABNORMAL LOW (ref 13.0–17.0)
MCH: 28.3 pg (ref 26.0–34.0)
MCHC: 32.3 g/dL (ref 30.0–36.0)
MCV: 87.5 fL (ref 80.0–100.0)
Platelets: 263 10*3/uL (ref 150–400)
RBC: 2.65 MIL/uL — ABNORMAL LOW (ref 4.22–5.81)
RDW: 14.6 % (ref 11.5–15.5)
WBC: 9.2 10*3/uL (ref 4.0–10.5)
nRBC: 0 % (ref 0.0–0.2)

## 2023-01-19 NOTE — Progress Notes (Signed)
[facility] PROGRESS NOTE  Hunter Reyes (DOB: [birth year]) URK:[id number] PCP: Hunter Squibb, MD  Brief Narrative: Hunter Reyes is a 47 y.o. male with a PMH of bipolar disorder, polysubstance abuse, COPD, chronic hepatitis B, and peripheral neuropathy who was admitted 12/02/2022 with acute renal failure with obstructive uropathy. Foley catheter was placed and the patient required dialysis intermittently up until 12/25/2022. Also found to have L4 epidural abscess with subacute symptoms of cauda equina syndrome necessitating a right L4 laminectomy and debridement 12/05/2022. Blood cultures grew MRSA. Additionally found to have abscess and osteomyelitis of the right foot for which transmetatarsal amputation was performed 12/09/2022. He continues on daptomycin with end date 02/03/2023 at which time he will need repeat lumbar spine imaging. Creatinine remains in the 4-5 range though with adequate output, not requiring dialysis.   Subjective: No new complaints, issues or events noted.  Objective: BP 102/68 (BP Location: Right Arm)   Pulse 84   Temp 98.4 F (36.9 C) (Oral)   Resp 18   Ht 6\' 2"  (1.88 m)   Wt 134.1 kg   SpO2 98%   BMI 37.96 kg/m   Gen: No distress Pulm: Nonlabored  CV: Regular, no pitting edema noted GI: Soft, NT, ND, +BS  Neuro: Alert and oriented. No new focal deficits. Skin: No rashes, R foot bandage clean/dry/intact. Left foot scabbed/excoriation:   Assessment & Plan:  MRSA bacteremia complicated by G3-T5 discitis with epidural abscess s/p decompression/debridement 12/05/22: - Continue daptomycin with end date 2/11. Repeat MRI on 1/8 did not show full resolution of diskitis. Will need repeat MRI of Lumbar Spine W/WO contrast toward the end of therapy(approximately 2/8-2/9) - Check CK and labs on Mondays.      Right Foot Abscess and Osteomyelitis s/p 12/09/22 TMA:  - Margins were reportedly clear. - Continue antibiotics as above per ID through 02/03/23 -  WBAT in postop shoe per Dr. Sharol Reyes.   AKI on stage IV CKD:  - CrCl stabilized in 15-30ml/min range with SCr in 4-5 range. UOP adequate and electrolytes stable.  - Avoid nephrotoxins, renally dose appropriate medications, monitor CrCl.   Hypokalemia: Resolved.   Chronic Normocytic Anemia:  -Likely in the setting of CKD as above and acute infection -12/17/22 received 1 unit pRBC. - Monitoring regularly   Epistaxis:  - Seems to be self-inflicted, improved with trimming fingernails. - SCD applied.  Recommend early ambulation, will hold chemical prophylaxis in the setting of previous bleed. - Recheck CBC - Continue po iron (suspected cause of constipation and darkened stools - augmenting bowel regimen and will monitor).   Bipolar Disorder: - Mood is stable on zoloft.   Polysubstance abuse, OUD: Hx crack cocaine use. No current evidence of withdrawal - Continue home suboxone. - Cessation counseling provided  Left foot injury -At some point it appears patient has scratched his left foot with overlying scab. Does not appear infected. Patient cannot recall when this occurred but it is certainly not acute.  Hunter Ishikawa, DO [facility] [email or web address] 01/19/2023, 7:41 AM

## 2023-01-19 NOTE — Progress Notes (Signed)
Mobility Specialist Progress Note:   01/19/23 1220  Mobility  Activity Transferred from bed to chair  Level of Assistance Modified independent, requires aide device or extra time  Assistive Device Front wheel walker  Distance Ambulated (ft) 2 ft  RLE Weight Bearing WBAT  Activity Response Tolerated well  Mobility Referral Yes  $Mobility charge 1 Mobility   Pt received in bed and agreeable. No complaints. Pt returned to chair with all needs met and call bell in reach.   Andrey Campanile Mobility Specialist Please contact via SecureChat or  Rehab office at [phone]

## 2023-01-20 DIAGNOSIS — B9562 Methicillin resistant Staphylococcus aureus infection as the cause of diseases classified elsewhere: Secondary | ICD-10-CM | POA: Diagnosis not present

## 2023-01-20 DIAGNOSIS — R7881 Bacteremia: Secondary | ICD-10-CM | POA: Diagnosis not present

## 2023-01-20 NOTE — Progress Notes (Signed)
[facility] PROGRESS NOTE  Hunter Reyes (DOB: 05-12-1976) YHC:[id number] PCP: Hunter Squibb, MD  Brief Narrative: Hunter Reyes is a 47 y.o. male with a PMH of bipolar disorder, polysubstance abuse, COPD, chronic hepatitis B, and peripheral neuropathy who was admitted 12/02/2022 with acute renal failure with obstructive uropathy. Foley catheter was placed and the patient required dialysis intermittently up until 12/25/2022. Also found to have L4 epidural abscess with subacute symptoms of cauda equina syndrome necessitating a right L4 laminectomy and debridement 12/05/2022. Blood cultures grew MRSA. Additionally found to have abscess and osteomyelitis of the right foot for which transmetatarsal amputation was performed 12/09/2022. He continues on daptomycin with end date 02/03/2023 at which time he will need repeat lumbar spine imaging. Creatinine remains in the 4-5 range though with adequate output, not requiring dialysis.   Subjective: No new complaints, issues or events noted overnight  Objective: BP 100/65 (BP Location: Right Arm)   Pulse 77   Temp 98.4 F (36.9 C) (Oral)   Resp 16   Ht 6\' 2"  (1.88 m)   Wt 134.1 kg   SpO2 98%   BMI 37.96 kg/m   Gen: No distress Pulm: Nonlabored  CV: Regular, no pitting edema noted GI: Soft, NT, ND, +BS  Neuro: Alert and oriented. No new focal deficits. Skin: No rashes, R foot bandage clean/dry/intact.   Assessment & Plan:  MRSA bacteremia complicated by D1-V6 discitis  Epidural abscess s/p decompression/debridement 12/05/22 - Continue daptomycin with end date 2/11. Repeat MRI on 1/8 did not show full resolution of diskitis.  - Will need repeat MRI of Lumbar Spine W/WO contrast toward the end of therapy(approximately 2/8-2/9) - Check CK and labs on Mondays.  Spotcheck CBC BMP as appropriate.   Right Foot Abscess and Osteomyelitis s/p 12/09/22 TMA:  - Margins were reported as clear intraoperatively. - Continue antibiotics as above  per ID through 02/03/23 - WBAT in postop shoe per Dr. Sharol Reyes.   AKI on stage IV CKD, resolved:  - CrCl stabilized in 15-30ml/min range with SCr in 4-5 range. UOP adequate and electrolytes stable.  - Avoid nephrotoxins, renally dose appropriate medications   Hypokalemia: Resolved.   Chronic Normocytic Anemia:  -Likely in the setting of CKD as above and acute infection -12/17/22 received 1 unit pRBC. - Monitoring regularly   Epistaxis:  - Seems to be self-inflicted, improved with trimming fingernails. - SCD applied.  Recommend early ambulation, will hold chemical prophylaxis in the setting of previous bleed. - Recheck CBC - Continue po iron (suspected cause of constipation and darkened stools - augmenting bowel regimen and will monitor).   Bipolar Disorder: - Mood is stable on zoloft.   Polysubstance abuse, OUD:  - Hx crack cocaine use. No current evidence of withdrawal - Continue home suboxone. - Cessation counseling provided  Left foot injury -It appears patient has scratched his left foot with overlying scab. Does not appear infected. Patient cannot recall when this occurred but it is certainly not acute due to appearance.  Hunter Ishikawa, DO [facility] [email or web address] 01/20/2023, 7:27 AM

## 2023-01-20 NOTE — Progress Notes (Signed)
Mobility Specialist Progress Note:   01/20/23 1153  Mobility  Activity Ambulated with assistance in hallway  Level of Assistance +2 (takes two people) (CG + Chair Follow)  Assistive Device Front wheel walker  Distance Ambulated (ft) 100 ft  RLE Weight Bearing WBAT  Activity Response Tolerated fair  Mobility Referral Yes  $Mobility charge 1 Mobility   Pt received in bed and eager. After 44ft, c/o lightheadedness, requiring 1x standing rest break. Pt returned to chair with all needs met, call bell in reach, and chair alarm on.   Andrey Campanile Mobility Specialist Please contact via SecureChat or  Rehab office at [phone]

## 2023-01-21 DIAGNOSIS — B9562 Methicillin resistant Staphylococcus aureus infection as the cause of diseases classified elsewhere: Secondary | ICD-10-CM | POA: Diagnosis not present

## 2023-01-21 DIAGNOSIS — R7881 Bacteremia: Secondary | ICD-10-CM | POA: Diagnosis not present

## 2023-01-21 LAB — CK: Total CK: 43 U/L — ABNORMAL LOW (ref 49–397)

## 2023-01-21 MED ORDER — THIAMINE MONONITRATE 100 MG PO TABS
100.0000 mg | ORAL_TABLET | Freq: Every day | ORAL | Status: DC
  Administered 2023-01-22 – 2023-02-03 (×13): 100 mg via ORAL
  Filled 2023-01-21 (×13): qty 1

## 2023-01-21 MED ORDER — FERROUS SULFATE 325 (65 FE) MG PO TABS
325.0000 mg | ORAL_TABLET | Freq: Every day | ORAL | Status: DC
  Administered 2023-01-22 – 2023-02-03 (×13): 325 mg via ORAL
  Filled 2023-01-21 (×15): qty 1

## 2023-01-21 NOTE — Progress Notes (Signed)
[facility] night cross cover note:   I was notified by RN this patient is experiencing sensation of bladder fullness, but has been unable to void urine spontaneously after several attempts.  Consider bladder scan demonstrates retained urine of 587 cc.  This is all in the context of the patient having had his Foley catheter removed today around 1500.  I subsequently placed order for straight cath x 1 now.    Update ~ 0530: RN updated me that the patient is still unable to urinate, with updated bladder scan showing 497 cc retained urine.  I subsequently placed an additional order for straight cath x 1 now.     Babs Bertin, DO Hospitalist

## 2023-01-21 NOTE — Progress Notes (Signed)
[facility] PROGRESS NOTE  Hunter Reyes (DOB: 14-Jul-1976) ZMC:[id number] PCP: Celene Squibb, MD  Brief Narrative: Hunter Reyes is a 47 y.o. male with a PMH of bipolar disorder, polysubstance abuse, COPD, chronic hepatitis B, and peripheral neuropathy who was admitted 12/02/2022 with acute renal failure with obstructive uropathy. Foley catheter was placed and the patient required dialysis intermittently up until 12/25/2022. Also found to have L4 epidural abscess with subacute symptoms of cauda equina syndrome necessitating a right L4 laminectomy and debridement 12/05/2022. Blood cultures grew MRSA. Additionally found to have abscess and osteomyelitis of the right foot for which transmetatarsal amputation was performed 12/09/2022. He continues on daptomycin with end date 02/03/2023 at which time he will need repeat lumbar spine imaging. Creatinine remains in the 4-5 range though with adequate output, not requiring dialysis.   Subjective: No new complaints, issues or events noted overnight  Objective: BP 100/69 (BP Location: Right Arm)   Pulse 70   Temp 98.4 F (36.9 C) (Oral)   Resp 16   Ht 6\' 2"  (1.88 m)   Wt 134.1 kg   SpO2 100%   BMI 37.96 kg/m   Gen: No distress Pulm: Nonlabored  CV: Regular, no pitting edema noted GI: Soft, NT, ND, +BS  Neuro: Alert and oriented. No new focal deficits. Skin: No rashes, R foot bandage clean/dry/intact.   Assessment & Plan:  MRSA bacteremia complicated by A4-S9 discitis  Epidural abscess s/p decompression/debridement 12/05/22 - Continue daptomycin with end date 2/11. Repeat MRI on 1/8 did not show full resolution of diskitis.  - Will need repeat MRI of Lumbar Spine W/WO contrast toward the end of therapy(approximately 2/8-2/9) - Check CK and labs on Mondays.  Spotcheck CBC BMP as appropriate.   Right Foot Abscess and Osteomyelitis s/p 12/09/22 TMA:  - Margins were reported as clear intraoperatively. - Continue antibiotics as above  per ID through 02/03/23 - WBAT in postop shoe per Dr. Sharol Given.   AKI on stage IV CKD, resolved:  - CrCl stabilized in 15-30ml/min range with SCr in 4-5 range. UOP adequate and electrolytes stable.  - Avoid nephrotoxins, renally dose appropriate medications   Hypokalemia: Resolved.   Chronic Normocytic Anemia:  -Likely in the setting of CKD as above and acute infection -12/17/22 received 1 unit pRBC. - Monitoring regularly   Epistaxis:  - Seems to be self-inflicted, improved with trimming fingernails. - SCD applied.  Recommend early ambulation, will hold chemical prophylaxis in the setting of previous bleed. - Recheck CBC - Continue po iron (suspected cause of constipation and darkened stools - augmenting bowel regimen and will monitor).   Bipolar Disorder: - Mood is stable on zoloft.   Polysubstance abuse, OUD:  - Hx crack cocaine use. No current evidence of withdrawal - Continue home suboxone. - Cessation counseling provided  Left foot injury -It appears patient has scratched his left foot with overlying scab. Does not appear infected. Patient cannot recall when this occurred but it is certainly not acute due to appearance.  [name], DO [facility] [email or web address] 01/21/2023, 7:24 AM

## 2023-01-21 NOTE — Progress Notes (Signed)
Physical Therapy Treatment Patient Details Name: Hunter Reyes MRN: [id number] DOB: [birth year] Today's Date: 01/21/2023   History of Present Illness 47 y.o. male admitted 12/10 with AMS, urinary retention and low back pain. Underwent L4 lumbar laminectomy for epidural abscess 12/13. s/p R foot transmet amp 12/17. Pt initiated HD on 12/26. PMH:  bipolar 1 disorder, borderline personality disorder, COPD, crack cocaine abuse, depression, peripheral neuropathy, history of right foot osteomyelitis, panic attack and history of seizures    PT Comments    Patient progressing to hallway ambulation now that he is WBAT on R foot with post-op shoe that fits.  He is noncompliant with back precautions frequently reaching down and walking with flexed posture though improved with cues.  Patient symptomatic upon return to the room with diaphoresis and nausea, but VSS, RN aware.  Patient remains appropriate for SNF.    Recommendations for follow up therapy are one component of a multi-disciplinary discharge planning process, led by the attending physician.  Recommendations may be updated based on patient status, additional functional criteria and insurance authorization.  Follow Up Recommendations  Skilled nursing-short term rehab (<3 hours/day) Can patient physically be transported by private vehicle: No   Assistance Recommended at Discharge Frequent or constant Supervision/Assistance  Patient can return home with the following Assistance with cooking/housework;Assist for transportation;Help with stairs or ramp for entrance;A lot of help with walking and/or transfers;A little help with bathing/dressing/bathroom   Equipment Recommendations  Rolling walker (2 wheels);Wheelchair (measurements PT)    Recommendations for Other Services       Precautions / Restrictions Precautions Precautions: Fall;Back Precaution Comments: watch HR, incontinent stool Required Braces or Orthoses: Other Brace Other Brace:  post op shoe Restrictions Weight Bearing Restrictions: Yes RLE Weight Bearing: Weight bearing as tolerated Other Position/Activity Restrictions: with post op shoe- updated weight bearing per Dr. Sharol Given on 01/15/23     Mobility  Bed Mobility Overal bed mobility: Needs Assistance Bed Mobility: Rolling, Sidelying to Sit Rolling: Supervision Sidelying to sit: Supervision Supine to sit: Supervision     General bed mobility comments: cue initially for sequence/technique    Transfers Overall transfer level: Needs assistance Equipment used: Rolling walker (2 wheels) Transfers: Sit to/from Stand, Bed to chair/wheelchair/BSC Sit to Stand: Min guard Stand pivot transfers: Min assist         General transfer comment: assist for balance and safety onto [facility] and to stand from bed and BSC    Ambulation/Gait Ambulation/Gait assistance: Min assist Gait Distance (Feet): 100 Feet Assistive device: Rolling walker (2 wheels) Gait Pattern/deviations: Step-through pattern, Decreased stride length, Trunk flexed, Knees buckling       General Gait Details: fatigues and needs standing rest leaning on wall then back to room, kept recliner close so pt sat in chiar once in room   Stairs             Wheelchair Mobility    Modified Rankin (Stroke Patients Only)       Balance Overall balance assessment: Needs assistance Sitting-balance support: No upper extremity supported, Feet supported Sitting balance-[name] Scale: Poor     Standing balance support: During functional activity, Bilateral upper extremity supported Standing balance-[name] Scale: Poor Standing balance comment: reliant on UE support when standing                            Cognition Arousal/Alertness: Awake/alert Behavior During Therapy: WFL for tasks assessed/performed Overall Cognitive Status: No family/caregiver present to determine  baseline cognitive functioning Area of Impairment: Attention, Problem  solving, Memory, Following commands, Safety/judgement, Awareness                   Current Attention Level: Sustained Memory: Decreased recall of precautions Following Commands: Follows one step commands with increased time Safety/Judgement: Decreased awareness of safety   Problem Solving: Slow processing          Exercises      General Comments General comments (skin integrity, edema, etc.): Patient asking to use BSC and had large soft black looking BM, RN aware.  Patient diaphoretic and nauseated after ambulation, VSS in chair, RN aware.      Pertinent Vitals/Pain Pain Assessment Faces Pain Scale: Hurts even more Pain Location: back Pain Descriptors / Indicators: Aching, Grimacing, Guarding Pain Intervention(s): Monitored during session, Limited activity within patient's tolerance    Home Living                          Prior Function            PT Goals (current goals can now be found in the care plan section) Progress towards PT goals: Progressing toward goals    Frequency    Min 2X/week      PT Plan Current plan remains appropriate    Co-evaluation              AM-PAC PT "6 Clicks" Mobility   Outcome Measure  Help needed turning from your back to your side while in a flat bed without using bedrails?: None Help needed moving from lying on your back to sitting on the side of a flat bed without using bedrails?: None Help needed moving to and from a bed to a chair (including a wheelchair)?: A Little Help needed standing up from a chair using your arms (e.g., wheelchair or bedside chair)?: A Little Help needed to walk in hospital room?: A Little Help needed climbing 3-5 steps with a railing? : Total 6 Click Score: 18    End of Session Equipment Utilized During Treatment: Gait belt Activity Tolerance: Patient limited by fatigue Patient left: in chair;with chair alarm set;with call bell/phone within reach   PT Visit Diagnosis:  Muscle weakness (generalized) (M62.81);Pain;Other abnormalities of gait and mobility (R26.89) Pain - Right/Left: Right Pain - part of body:  (back)     Time: 5003-7048 PT Time Calculation (min) (ACUTE ONLY): 46 min  Charges:  $Gait Training: 8-22 mins $Therapeutic Activity: 23-37 mins                     Magda Kiel, PT Acute Rehabilitation Services [phone] 01/21/2023    Reginia Naas 01/21/2023, 1:46 PM

## 2023-01-21 NOTE — Progress Notes (Signed)
Mobility Specialist Progress Note:   01/21/23 1516  Mobility  Activity Transferred from chair to bed  Level of Assistance Standby assist, set-up cues, supervision of patient - no hands on  Assistive Device Front wheel walker  Distance Ambulated (ft) 2 ft  RLE Weight Bearing WBAT  Activity Response Tolerated well  Mobility Referral Yes  $Mobility charge 1 Mobility   Pt received in chair and requesting assistance back to bed. No complaints. Pt returned to bed with all needs met and call bell in reach.   Andrey Campanile Mobility Specialist Please contact via SecureChat or  Rehab office at [phone]

## 2023-01-22 DIAGNOSIS — R7881 Bacteremia: Secondary | ICD-10-CM | POA: Diagnosis not present

## 2023-01-22 DIAGNOSIS — B9562 Methicillin resistant Staphylococcus aureus infection as the cause of diseases classified elsewhere: Secondary | ICD-10-CM | POA: Diagnosis not present

## 2023-01-22 LAB — BASIC METABOLIC PANEL
Anion gap: 12 (ref 5–15)
BUN: 63 mg/dL — ABNORMAL HIGH (ref 6–20)
CO2: 23 mmol/L (ref 22–32)
Calcium: 9.5 mg/dL (ref 8.9–10.3)
Chloride: 101 mmol/L (ref 98–111)
Creatinine, Ser: 5.14 mg/dL — ABNORMAL HIGH (ref 0.61–1.24)
GFR, Estimated: 13 mL/min — ABNORMAL LOW (ref >60)
Glucose, Bld: 98 mg/dL (ref 70–99)
Potassium: 4.1 mmol/L (ref 3.5–5.1)
Sodium: 136 mmol/L (ref 135–145)

## 2023-01-22 LAB — CBC
HCT: 23.3 % — ABNORMAL LOW (ref 39.0–52.0)
Hemoglobin: 7.7 g/dL — ABNORMAL LOW (ref 13.0–17.0)
MCH: 28.5 pg (ref 26.0–34.0)
MCHC: 33 g/dL (ref 30.0–36.0)
MCV: 86.3 fL (ref 80.0–100.0)
Platelets: 258 10*3/uL (ref 150–400)
RBC: 2.7 MIL/uL — ABNORMAL LOW (ref 4.22–5.81)
RDW: 14.6 % (ref 11.5–15.5)
WBC: 11.2 10*3/uL — ABNORMAL HIGH (ref 4.0–10.5)
nRBC: 0 % (ref 0.0–0.2)

## 2023-01-22 MED ORDER — TAMSULOSIN HCL 0.4 MG PO CAPS
0.8000 mg | ORAL_CAPSULE | Freq: Every day | ORAL | Status: DC
  Administered 2023-01-22 – 2023-02-02 (×12): 0.8 mg via ORAL
  Filled 2023-01-22 (×12): qty 2

## 2023-01-22 NOTE — Progress Notes (Signed)
[facility] PROGRESS NOTE  Hunter Reyes (DOB: 1976/07/15) HUT:[id number] PCP: Celene Squibb, MD  Brief Narrative: Hunter Reyes is a 47 y.o. male with a PMH of bipolar disorder, polysubstance abuse, COPD, chronic hepatitis B, and peripheral neuropathy who was admitted 12/02/2022 with acute renal failure with obstructive uropathy. Foley catheter was placed and the patient required dialysis intermittently up until 12/25/2022. Also found to have L4 epidural abscess with subacute symptoms of cauda equina syndrome necessitating a right L4 laminectomy and debridement 12/05/2022. Blood cultures grew MRSA. Additionally found to have abscess and osteomyelitis of the right foot for which transmetatarsal amputation was performed 12/09/2022. He continues on daptomycin with end date 02/03/2023 at which time he will need repeat lumbar spine imaging. Creatinine remains in the 4-5 range though with adequate output, not requiring dialysis.   Subjective: Multiple issues attempting to urinate overnight - I/O cath x2 - ROS otherwise unremarkable.  Objective: BP (!) 100/59 (BP Location: Right Arm)   Pulse 78   Temp 98 F (36.7 C) (Oral)   Resp 16   Ht 6\' 2"  (1.88 m)   Wt 134.1 kg   SpO2 99%   BMI 37.96 kg/m   Gen: No distress Pulm: Nonlabored  CV: Regular, no pitting edema noted GI: Soft, NT, ND, +BS  Neuro: Alert and oriented. No new focal deficits. Skin: No rashes, R foot bandage clean/dry/intact.   Assessment & Plan:  MRSA bacteremia complicated by S5-K8 discitis  Epidural abscess s/p decompression/debridement 12/05/22 - Continue daptomycin with end date 2/11. Repeat MRI on 1/8 did not show full resolution of diskitis.  - Will need repeat MRI of Lumbar Spine W/WO contrast toward the end of therapy(approximately 2/8-2/9) - Check CK and labs on Mondays.  Spotcheck CBC BMP as appropriate.   Right Foot Abscess and Osteomyelitis s/p 12/09/22 TMA:  - Margins were reported as clear  intraoperatively. - Continue antibiotics as above per ID through 02/03/23 - WBAT in postop shoe per Dr. Sharol Given.   AKI on stage IV CKD, resolved: Urinary obstruction, unspecified  - CrCl stabilized in 15-30ml/min range with SCr in 4-5 range. UOP adequate and electrolytes stable.  - Avoid nephrotoxins, renally dose appropriate medications - Attempted foley removal 01/21/23 with abysmal results - required multiple I/O cath - unable to urinate independently - will increase tamsulosin and replace foley - try foley removal again in the next week or so.   Hypokalemia: Resolved.   Chronic Normocytic Anemia:  -Likely in the setting of CKD as above and acute infection -12/17/22 received 1 unit pRBC. - Monitoring regularly   Epistaxis:  - Seems to be self-inflicted, improved with trimming fingernails. - SCD applied.  Recommend early ambulation, will hold chemical prophylaxis in the setting of previous bleed. - Recheck CBC - Continue po iron (suspected cause of constipation and darkened stools - augmenting bowel regimen and will monitor).   Bipolar Disorder: - Mood is stable on zoloft.   Polysubstance abuse, OUD:  - Hx crack cocaine use. No current evidence of withdrawal - Continue home suboxone. - Cessation counseling provided  Left foot injury -It appears patient has scratched his left foot with overlying scab. Does not appear infected. Patient cannot recall when this occurred but it is certainly not acute due to appearance.  [name], DO [facility] [email or web address] 01/22/2023, 7:16 AM

## 2023-01-22 NOTE — Progress Notes (Signed)
Occupational Therapy Treatment Patient Details Name: Hunter Reyes MRN: [id number] DOB: [birth year] Today's Date: 01/22/2023   History of present illness 47 y.o. male admitted 12/10 with AMS, urinary retention and low back pain. Underwent L4 lumbar laminectomy for epidural abscess 12/13. s/p R foot transmet amp 12/17. Pt initiated HD on 12/26. PMH:  bipolar 1 disorder, borderline personality disorder, COPD, crack cocaine abuse, depression, peripheral neuropathy, history of right foot osteomyelitis, panic attack and history of seizures   OT comments  Pt making continued progress towards OT goals, able to update all goals based on upgrade to WBAT RLE status which improves pt independence/safety. Pt able to manage post op shoe without assist today, complete transfer to chair with RW at min guard. Pt politely deferred mobility attempts due to reports of feeling swimmyheaded when up moving. Emphasis on slow transitional movements, gradually improvements in endurance and reinforcement of UE HEP. Provided UE HEP handout to improve overall carryover. Noted ongoing difficulties w/ DC disposition, will likely progress beyond needs of SNF rehab by time of DC.   Recommendations for follow up therapy are one component of a multi-disciplinary discharge planning process, led by the attending physician.  Recommendations may be updated based on patient status, additional functional criteria and insurance authorization.    Follow Up Recommendations  Skilled nursing-short term rehab (<3 hours/day)     Assistance Recommended at Discharge Intermittent Supervision/Assistance  Patient can return home with the following  A little help with walking and/or transfers;A little help with bathing/dressing/bathroom;Assistance with Education officer, environmental cushion (measurements OT);Wheelchair (measurements OT);Other (comment) (RW)    Recommendations for Other Services      Precautions /  Restrictions Precautions Precautions: Fall;Back Precaution Comments: watch HR, incontinent stool Required Braces or Orthoses: Other Brace Other Brace: post op shoe Restrictions Weight Bearing Restrictions: Yes RLE Weight Bearing: Weight bearing as tolerated       Mobility Bed Mobility Overal bed mobility: Needs Assistance Bed Mobility: Rolling, Sidelying to Sit Rolling: Supervision Sidelying to sit: Supervision            Transfers Overall transfer level: Needs assistance Equipment used: Rolling walker (2 wheels) Transfers: Sit to/from Stand, Bed to chair/wheelchair/BSC Sit to Stand: Supervision     Step pivot transfers: Min guard     General transfer comment: minor cues for pacing and monitoring of symptoms w/ good carryover     Balance Overall balance assessment: Needs assistance Sitting-balance support: No upper extremity supported, Feet supported Sitting balance-[name] Scale: Good     Standing balance support: During functional activity, Bilateral upper extremity supported Standing balance-[name] Scale: Poor                             ADL either performed or assessed with clinical judgement   ADL Overall ADL's : Needs assistance/impaired Eating/Feeding: Independent;Sitting   Grooming: Set up;Sitting;Wash/dry face               Lower Body Dressing: Supervision/safety;Sitting/lateral leans Lower Body Dressing Details (indicate cue type and reason): able to don R post op shoe w/ minor cues for problem solving w/ back precautions, can bring LE to self fairly well. did demo slight bend to manage velcro straps               General ADL Comments: Focus on addressing goals, progress, post op shoe mgmt and UE HEP education to improve carryover over. Pt able to recall 2/4  UE exercises - reports good challenge w/ level 2 theraband and shoulder muscles    Extremity/Trunk Assessment Upper Extremity Assessment Upper Extremity Assessment:  Generalized weakness   Lower Extremity Assessment Lower Extremity Assessment: Defer to PT evaluation        Vision   Vision Assessment?: No apparent visual deficits   Perception     Praxis      Cognition Arousal/Alertness: Awake/alert Behavior During Therapy: WFL for tasks assessed/performed Overall Cognitive Status: No family/caregiver present to determine baseline cognitive functioning Area of Impairment: Attention, Problem solving, Safety/judgement, Awareness, Memory                   Current Attention Level: Selective Memory: Decreased recall of precautions   Safety/Judgement: Decreased awareness of safety Awareness: Emergent Problem Solving: Slow processing General Comments: pleasant, follows directions well and shows insight into deficits (incontinence, dizziness in standing), able to give detailed accounts of prior lab draws, MD that he has seen and therapists that he has worked with.        Exercises Exercises: General Upper Extremity General Exercises - Upper Extremity Shoulder Flexion: Strengthening, Both, 5 reps, Theraband Theraband Level (Shoulder Flexion): Level 2 (Red) Shoulder ABduction: Strengthening, Both, 5 reps, Seated, Theraband Theraband Level (Shoulder Abduction): Level 2 (Red) Elbow Flexion: Strengthening, Both, 5 reps, Seated, Theraband Theraband Level (Elbow Flexion): Level 2 (Red) Elbow Extension: Strengthening, Both, Seated, Theraband, 5 reps Theraband Level (Elbow Extension): Level 2 (Red)    Shoulder Instructions       General Comments      Pertinent Vitals/ Pain       Pain Assessment Pain Assessment: Faces Faces Pain Scale: Hurts little more Pain Location: back, R foot Pain Descriptors / Indicators: Grimacing, Guarding Pain Intervention(s): Monitored during session  Home Living                                          Prior Functioning/Environment              Frequency  Min 2X/week         Progress Toward Goals  OT Goals(current goals can now be found in the care plan section)  Progress towards OT goals: Progressing toward goals  Acute Rehab OT Goals Patient Stated Goal: get head to feel better, increase strength OT Goal Formulation: With patient Time For Goal Achievement: 02/05/23 Potential to Achieve Goals: Good  Plan Discharge plan remains appropriate    Co-evaluation                 AM-PAC OT "6 Clicks" Daily Activity     Outcome Measure   Help from another person eating meals?: None Help from another person taking care of personal grooming?: A Little Help from another person toileting, which includes using toliet, bedpan, or urinal?: A Lot Help from another person bathing (including washing, rinsing, drying)?: A Lot Help from another person to put on and taking off regular upper body clothing?: A Little Help from another person to put on and taking off regular lower body clothing?: A Little 6 Click Score: 17    End of Session Equipment Utilized During Treatment: Rolling walker (2 wheels);Gait belt  OT Visit Diagnosis: Unsteadiness on feet (R26.81);Muscle weakness (generalized) (M62.81);Pain;Other symptoms and signs involving cognitive function;Other abnormalities of gait and mobility (R26.89) Pain - Right/Left: Right Pain - part of body: Ankle and joints of foot  Activity Tolerance Patient tolerated treatment well   Patient Left in chair;with call bell/phone within reach;with chair alarm set   Nurse Communication          Time: [phone] OT Time Calculation (min): 25 min  Charges: OT General Charges $OT Visit: 1 Visit OT Treatments $Self Care/Home Management : 8-22 mins $Therapeutic Activity: 8-22 mins  Malachy Chamber, OTR/L Acute Rehab Services Office: [place] 01/22/2023, 8:00 AM

## 2023-01-23 DIAGNOSIS — R7881 Bacteremia: Secondary | ICD-10-CM | POA: Diagnosis not present

## 2023-01-23 DIAGNOSIS — B9562 Methicillin resistant Staphylococcus aureus infection as the cause of diseases classified elsewhere: Secondary | ICD-10-CM | POA: Diagnosis not present

## 2023-01-23 MED ORDER — FINASTERIDE 5 MG PO TABS
5.0000 mg | ORAL_TABLET | Freq: Every day | ORAL | Status: DC
  Administered 2023-01-23 – 2023-02-03 (×12): 5 mg via ORAL
  Filled 2023-01-23 (×12): qty 1

## 2023-01-23 NOTE — Progress Notes (Signed)
Orthopedic Tech Progress Note Patient Details:  Hunter Reyes 05/13/1976 [id number]  Ortho Devices Type of Ortho Device: CAM walker Ortho Device/Splint Location: RLE Ortho Device/Splint Interventions: Ordered   Post Interventions Patient Tolerated: Well Instructions Provided: Care of device  Hunter Reyes [name] 01/23/2023, 3:50 PM

## 2023-01-23 NOTE — Progress Notes (Signed)
[facility] PROGRESS NOTE  ERROLL WILBOURNE (DOB: 01/07/1976) IRC:[id number] PCP: Celene Squibb, MD  Brief Narrative: Hunter Reyes is a 47 y.o. male with a PMH of bipolar disorder, polysubstance abuse, COPD, chronic hepatitis B, and peripheral neuropathy who was admitted 12/02/2022 with acute renal failure with obstructive uropathy. Foley catheter was placed and the patient required dialysis intermittently up until 12/25/2022. Also found to have L4 epidural abscess with subacute symptoms of cauda equina syndrome necessitating a right L4 laminectomy and debridement 12/05/2022. Blood cultures grew MRSA. Additionally found to have abscess and osteomyelitis of the right foot for which transmetatarsal amputation was performed 12/09/2022. He continues on daptomycin with end date 02/03/2023 at which time he will need repeat lumbar spine imaging. Creatinine remains in the 4-5 range though with adequate output, not requiring dialysis.   Subjective: Patient comfortable with Foley, no fever no chills - Tells me he feels unsteady with the postop boot-I discussed with physical therapy and they feel a cam walker if patient is weightbearing as tolerated can be used  Objective: BP (!) 92/55 (BP Location: Left Arm)   Pulse 95   Temp 98 F (36.7 C) (Oral)   Resp 18   Ht 6\' 2"  (1.88 m)   Wt 134.1 kg   SpO2 90%   BMI 37.96 kg/m   Gen: No distress Pulm: Nonlabored  CV: Regular, no pitting edema noted GI: Soft, NT, ND, +BS  Neuro: Alert and oriented. No new focal deficits. Skin: No rashes, R foot bandage clean/dry/intact.   Assessment & Plan:  MRSA bacteremia complicated by P1-W2 discitis  Epidural abscess s/p decompression/debridement 12/05/22 - Continue daptomycin with end date 2/11. MRI on 1/8 did not show full resolution of diskitis.  - MRI of Lumbar Spine W/WO contrast toward the end of therapy(approximately 2/8-2/9) - Check CK and labs on Mondays.  Spotcheck CBC BMP as appropriate.    Right Foot Abscess and Osteomyelitis s/p 12/09/22 TMA:  - Margins were reported as clear intraoperatively. - Continue antibiotics as above per ID through 02/03/23 - WBAT in postop shoe per Dr. Sharol Given, I have requested that we use a cam walker to ensure that patient can ambulate a little bit more stably and will review wounds on lower extremity at next dressing change   AKI on stage IV CKD, resolved: Urinary obstruction, unspecified  - CrCl stabilized in 15-30ml/min range with SCr in 4-5 range. UOP adequate and electrolytes stable.  - Avoid nephrotoxins, renally dose appropriate medications -Foley removal attempted 1/29 with no good result-patient required I/O, continue Flomax 0.8 add finasteride 5 and reattempt removal   Hypokalemia: Resolved.   Chronic Normocytic Anemia:  -Likely in the setting of CKD as above and acute infection -12/17/22 received 1 unit pRBC. - Monitoring regularly   Epistaxis:  - Seems to be self-inflicted, improved with trimming fingernails. - SCD applied--needs early ambulation - Recheck CBC - Continue po iron    Bipolar Disorder: - Mood is stable on zoloft.   Polysubstance abuse, OUD:  - Hx crack cocaine use. No current evidence of withdrawal - Continue home suboxone. - Cessation counseling provided  Left foot injury -It appears patient has scratched his left foot with overlying scab.  -Will review wounds tomorrow  Nita Sells, MD [facility] [email or web address] 01/23/2023, 3:10 PM

## 2023-01-24 DIAGNOSIS — B9562 Methicillin resistant Staphylococcus aureus infection as the cause of diseases classified elsewhere: Secondary | ICD-10-CM | POA: Diagnosis not present

## 2023-01-24 DIAGNOSIS — R7881 Bacteremia: Secondary | ICD-10-CM | POA: Diagnosis not present

## 2023-01-24 MED ORDER — MEDIHONEY WOUND/BURN DRESSING EX PSTE
1.0000 | PASTE | Freq: Every day | CUTANEOUS | Status: DC
  Administered 2023-01-24 – 2023-02-03 (×11): 1 via TOPICAL
  Filled 2023-01-24: qty 44

## 2023-01-24 NOTE — Progress Notes (Signed)
[facility] PROGRESS NOTE  Hunter Reyes (DOB: 08-13-1976) WCB:[id number] PCP: Celene Squibb, MD  Brief Narrative: Hunter Reyes is a 47 y.o. male with a PMH of bipolar disorder, polysubstance abuse, COPD, chronic hepatitis B, and peripheral neuropathy who was admitted 12/02/2022 with acute renal failure with obstructive uropathy. Foley catheter was placed and the patient required dialysis intermittently up until 12/25/2022. Also found to have L4 epidural abscess with subacute symptoms of cauda equina syndrome necessitating a right L4 laminectomy and debridement 12/05/2022. Blood cultures grew MRSA. Additionally found to have abscess and osteomyelitis of the right foot for which transmetatarsal amputation was performed 12/09/2022. He continues on daptomycin with end date 02/03/2023 at which time he will need repeat lumbar spine imaging. Creatinine remains in the 4-5 range though with adequate output, not requiring dialysis.   Subjective: Patient upset about diet asking for regular diet wants to have tomatoes but is on renal restricted diet-I have told him that I am okay with this but he should not take dark-colored sodas-I have told him we will need lab check a little bit more frequently Other than that no pain wound nurse note reviewed from today and noted changes to plan for lower extremities  Objective: BP 108/68 (BP Location: Right Arm)   Pulse 79   Temp 98 F (36.7 C) (Oral)   Resp 16   Ht 6\' 2"  (1.88 m)   Wt 134.1 kg   SpO2 97%   BMI 37.96 kg/m   Coherent pleasant white male looking younger than stated age Chest is clear no rales rhonchi wheeze S1-S2 no murmur Abdomen is soft Right foot is unwrapped and has Medihoney and other dressings and I did not disturb the same-please see wound note from 01/24/2023   Assessment & Plan:  MRSA bacteremia complicated by O1-Y0 discitis  Epidural abscess s/p decompression/debridement 12/05/22 - Continue daptomycin with end date 2/11.  MRI on 1/8 did not show full resolution of diskitis.  - MRI of Lumbar Spine W/WO contrast toward the end of therapy(approximately 2/8-2/9) -Get labs every 48 hours   Right Foot Abscess and Osteomyelitis s/p 12/09/22 TMA:  - Margins were reported as clear intraoperatively. - Continue antibiotics as above per ID through 02/03/23 - WBAT in postop shoe per Dr. Sharol Given, per my discussion with him on 1/31 - Careful and close monitoring of unstageable wound on heel with help of wound therapy   AKI on stage IV CKD, resolved: Urinary obstruction, unspecified  - CrCl stabilized in 15-30ml/min range with SCr in 4-5 range. UOP adequate and electrolytes stable.  - Avoid nephrotoxins, renally dose appropriate medications -Patient requesting regular diet we will allow the same -Foley removal attempted 1/29 with no good result-patient required I/O, continue Flomax 0.8 add finasteride 5 and reattempt removal   Hypokalemia: Resolved.   Chronic Normocytic Anemia:  -Likely in the setting of CKD as above and acute infection -12/17/22 received 1 unit pRBC. - Monitoring regularly   Epistaxis:  - Seems to be self-inflicted, improved with trimming fingernails. - SCD applied--needs early ambulation - Recheck CBC - Continue po iron    Bipolar Disorder: - Mood is stable on zoloft.   Polysubstance abuse, OUD:  - Hx crack cocaine use. No current evidence of withdrawal - Continue home suboxone. - Cessation counseling provided  Left foot injury -It appears patient has scratched his left foot with overlying scab.  - See wound note from 2/1.  Go  Nita Sells, MD [facility] [email or web address] 01/24/2023, 4:56 PM

## 2023-01-24 NOTE — Progress Notes (Signed)
Physical Therapy Treatment Patient Details Name: Hunter Reyes MRN: [id number] DOB: [birth year] Today's Date: 01/24/2023   History of Present Illness 47 y.o. male admitted 12/10 with AMS, urinary retention and low back pain. Underwent L4 lumbar laminectomy for epidural abscess 12/13. s/p R foot transmet amp 12/17. Pt initiated HD on 12/26. PMH:  bipolar 1 disorder, borderline personality disorder, COPD, crack cocaine abuse, depression, peripheral neuropathy, history of right foot osteomyelitis, panic attack and history of seizures    PT Comments    Patient resting in bed and c/o soreness in heels bil. Lt foot with dark callous on heel, RN notified and Rt foot covered in wound dressing. RN planning to change dressing and assess bil heels. Pt agreeable to mobilize and educated on donning CAM boot and need for assist to maintain back precautions. He was able to ambulate ~100' with 1x seated rest break and 1x standing rest break during second portion of gait distance. Walker height adjusted and cues for pt to remain close to front of walker improved ability to maintain upright posture. Pt c/o increased pulling sensation at knee, suspect secondary to great quad activation due to weight of CAM boot. EOS pt reposition in recliner with LE's elevated and working to order breakfast. He will benefit from SNF rehab when medically ready for discharge. Will continue to progress as able in acute setting.   Recommendations for follow up therapy are one component of a multi-disciplinary discharge planning process, led by the attending physician.  Recommendations may be updated based on patient status, additional functional criteria and insurance authorization.  Follow Up Recommendations  Skilled nursing-short term rehab (<3 hours/day) Can patient physically be transported by private vehicle: No   Assistance Recommended at Discharge Frequent or constant Supervision/Assistance  Patient can return home with the  following Assistance with cooking/housework;Assist for transportation;Help with stairs or ramp for entrance;A lot of help with walking and/or transfers;A little help with bathing/dressing/bathroom   Equipment Recommendations  Rolling walker (2 wheels);Wheelchair (measurements PT)    Recommendations for Other Services       Precautions / Restrictions Precautions Precautions: Fall;Back Precaution Comments: watch HR, incontinent stool Required Braces or Orthoses: Other Brace Other Brace: post op shoe- CAM boot Restrictions Weight Bearing Restrictions: Yes RLE Weight Bearing: Weight bearing as tolerated     Mobility  Bed Mobility Overal bed mobility: Needs Assistance Bed Mobility: Rolling, Sidelying to Sit Rolling: Supervision Sidelying to sit: Supervision       General bed mobility comments: cues needed to encourage pt following back precautions. cues and supervision for safety. in recliner EOS.    Transfers Overall transfer level: Needs assistance Equipment used: Rolling walker (2 wheels) Transfers: Sit to/from Stand, Bed to chair/wheelchair/BSC Sit to Stand: Supervision           General transfer comment: cues for sinlgle or bil UE use for power up. good carryover throughout session for rise from EOB and recliner.    Ambulation/Gait Ambulation/Gait assistance: Min assist, Min guard Gait Distance (Feet): 100 Feet (one seated rest break at ~45') Assistive device: Rolling walker (2 wheels) Gait Pattern/deviations: Step-through pattern, Decreased stride length, Trunk flexed Gait velocity: decr     General Gait Details: min guard throughout with intermittent assist and cues for posture, position to walker, and standing rest breaks. Pt required 1x seated rest and 1x standing rest during second bout. pt posture improved with walker height lowered slightly and cues to stand inside walker. Pt noted increased tightness/quad activation on Rt LE  due to weight of CAM  boot.   Stairs             Wheelchair Mobility    Modified Rankin (Stroke Patients Only)       Balance Overall balance assessment: Needs assistance Sitting-balance support: No upper extremity supported, Feet supported Sitting balance-[name] Scale: Good     Standing balance support: During functional activity, Bilateral upper extremity supported Standing balance-[name] Scale: Poor                              Cognition Arousal/Alertness: Awake/alert Behavior During Therapy: WFL for tasks assessed/performed Overall Cognitive Status: No family/caregiver present to determine baseline cognitive functioning Area of Impairment: Attention, Problem solving, Safety/judgement, Awareness, Memory                   Current Attention Level: Selective Memory: Decreased recall of precautions   Safety/Judgement: Decreased awareness of safety Awareness: Emergent Problem Solving: Slow processing, Requires verbal cues General Comments: pleasant, follows directions well and shows insight into deficits (incontinence, dizziness in standing), able to give detailed accounts of prior lab draws, MD that he has seen and therapists that he has worked with.        Exercises      General Comments        Pertinent Vitals/Pain Pain Assessment Pain Assessment: Faces Faces Pain Scale: Hurts a little bit Pain Location: back, R foot Pain Descriptors / Indicators: Grimacing, Guarding Pain Intervention(s): Limited activity within patient's tolerance, Monitored during session, Repositioned    Home Living                          Prior Function            PT Goals (current goals can now be found in the care plan section) Acute Rehab PT Goals PT Goal Formulation: With patient Time For Goal Achievement: 01/21/23 Potential to Achieve Goals: Fair Progress towards PT goals: Progressing toward goals    Frequency    Min 2X/week      PT Plan Current plan  remains appropriate    Co-evaluation              AM-PAC PT "6 Clicks" Mobility   Outcome Measure  Help needed turning from your back to your side while in a flat bed without using bedrails?: None Help needed moving from lying on your back to sitting on the side of a flat bed without using bedrails?: None Help needed moving to and from a bed to a chair (including a wheelchair)?: A Little Help needed standing up from a chair using your arms (e.g., wheelchair or bedside chair)?: A Little Help needed to walk in hospital room?: A Little Help needed climbing 3-5 steps with a railing? : A Lot 6 Click Score: 19    End of Session Equipment Utilized During Treatment: Gait belt Activity Tolerance: Patient tolerated treatment well Patient left: in chair;with chair alarm set;with call bell/phone within reach Nurse Communication: Mobility status PT Visit Diagnosis: Muscle weakness (generalized) (M62.81);Pain;Other abnormalities of gait and mobility (R26.89) Pain - Right/Left: Right Pain - part of body: Ankle and joints of foot     Time: 3710-6269 PT Time Calculation (min) (ACUTE ONLY): 18 min  Charges:  $Gait Training: 8-22 mins                     Gwynneth Albright. PT,  DPT Acute Rehabilitation Services Office [phone]  01/24/23 9:50 AM

## 2023-01-24 NOTE — Progress Notes (Signed)
Patient bladder scanned and is retaining 521 mls of urine in bladder. Dr. Avon Gully made aware. Per Dr. Avon Gully to place urinary catheter

## 2023-01-24 NOTE — Consult Note (Signed)
[facility] Nurse Consult Note: Reason for Consult: Consult requested for bilat heels. Pt had surgery for right anterior foot in Dec and has been followed by the ortho team for assessment and plan of care.  Wound type: Left heel with previous Deep tissue pressure injury, peels off easily, revealing pink dry healed intact skin Right heel with Unstageable pressure injury; .8X.8cm, 100% soft eschar, small amt tan drainage. Pressure Injury POA:No Right anterior foot with post-op sutures intact and well-approximated. Dressing procedure/placement/frequency: Float heels to reduce pressure.  Topical treatment orders provided for bedside nurses to perform as follows to assist with removal of nonviable tissue:  1. Apply Medihoney to right heel Q day, then cover with foam dressing.  Change foam dressing Q 3 days or PRN soiling 2. Apply xeroform gauze to right sutures Q day, then cover with foam dressing.  (Change foam dressing Q 3 days or PRN soiling.) Please re-consult if further assistance is needed.  Thank-you,  Julien Girt MSN, [place], [place], [place], [place]

## 2023-01-25 DIAGNOSIS — B9562 Methicillin resistant Staphylococcus aureus infection as the cause of diseases classified elsewhere: Secondary | ICD-10-CM | POA: Diagnosis not present

## 2023-01-25 DIAGNOSIS — R7881 Bacteremia: Secondary | ICD-10-CM | POA: Diagnosis not present

## 2023-01-25 LAB — BASIC METABOLIC PANEL
Anion gap: 12 (ref 5–15)
BUN: 58 mg/dL — ABNORMAL HIGH (ref 6–20)
CO2: 22 mmol/L (ref 22–32)
Calcium: 9.4 mg/dL (ref 8.9–10.3)
Chloride: 102 mmol/L (ref 98–111)
Creatinine, Ser: 4.96 mg/dL — ABNORMAL HIGH (ref 0.61–1.24)
GFR, Estimated: 14 mL/min — ABNORMAL LOW (ref >60)
Glucose, Bld: 96 mg/dL (ref 70–99)
Potassium: 3.6 mmol/L (ref 3.5–5.1)
Sodium: 136 mmol/L (ref 135–145)

## 2023-01-25 LAB — CBC
HCT: 23.4 % — ABNORMAL LOW (ref 39.0–52.0)
Hemoglobin: 7.7 g/dL — ABNORMAL LOW (ref 13.0–17.0)
MCH: 28.8 pg (ref 26.0–34.0)
MCHC: 32.9 g/dL (ref 30.0–36.0)
MCV: 87.6 fL (ref 80.0–100.0)
Platelets: 266 10*3/uL (ref 150–400)
RBC: 2.67 MIL/uL — ABNORMAL LOW (ref 4.22–5.81)
RDW: 14.7 % (ref 11.5–15.5)
WBC: 9.2 10*3/uL (ref 4.0–10.5)
nRBC: 0 % (ref 0.0–0.2)

## 2023-01-25 LAB — CK: Total CK: 34 U/L — ABNORMAL LOW (ref 49–397)

## 2023-01-25 NOTE — Progress Notes (Addendum)
[facility] PROGRESS NOTE  Hunter Reyes (DOB: [birth year]) ZTI:[id number] PCP: Celene Squibb, MD  Brief Narrative: Hunter Reyes is a 47 y.o. male with a PMH of bipolar disorder, polysubstance abuse, COPD, chronic hepatitis B, and peripheral neuropathy who was admitted 12/02/2022 with acute renal failure with obstructive uropathy. Foley catheter was placed and the patient required dialysis intermittently up until 12/25/2022. Also found to have L4 epidural abscess with subacute symptoms of cauda equina syndrome necessitating a right L4 laminectomy and debridement 12/05/2022. Blood cultures grew MRSA. Additionally found to have abscess and osteomyelitis of the right foot for which transmetatarsal amputation was performed 12/09/2022. He continues on daptomycin with end date 02/03/2023 at which time he will need repeat lumbar spine imaging. Creatinine remains in the 4-5 range though with adequate output, not requiring dialysis.   Subjective:  Awake coherent pleasant looks comfortable sitting in chair No fever nausea vomiting Pain is moderate He is aware that we will start cutting back pain meds eventually Wound was examined today 2/2 independently and looks clean  Objective: BP (!) 91/55 (BP Location: Left Arm)   Pulse 79   Temp 98.1 F (36.7 C) (Oral)   Resp 16   Ht 6\' 2"  (1.88 m)   Wt 134.1 kg   SpO2 99%   BMI 37.96 kg/m   Coherent pleasant white male looking younger than stated age Chest is clear no rales rhonchi wheeze S1-S2 no murmur Abdomen is soft Right foot examined today with sutures in place as well as slight discoloration of the tip but no bruising and was then recovered   Assessment & Plan:  MRSA bacteremia complicated by A2-N0 discitis  Epidural abscess s/p decompression/debridement 12/05/22 - Continue daptomycin with end date 2/11. MRI on 1/8 did not show full resolution of diskitis.  - MRI of Lumbar Spine W/WO contrast toward the end of therapy  (approximately 2/8-2/9) -Get labs every 48 hours and next labs will get phosphorus   Right Foot Abscess and Osteomyelitis s/p 12/09/22 TMA:  - Margins were reported as clear intraoperatively. - Continue antibiotics as above per ID through 02/03/23 - WBAT in postop shoe per Dr. Sharol Given, per my discussion with him on 1/31 - HEEL WOUND ON MY EXAM IMPROVED 2/2   AKI on stage IV CKD, resolved: Urinary obstruction, unspecified  - CrCl stabilized iSCr in 4-5 range. UOP adequate and electrolytes stable-need to watch phosphorus as patient noncompliant with renal diet - Avoid nephrotoxins, renally dose appropriate medications -Patient requesting regular diet we will allow the same -Foley removal attempted 1/29 with no good result-patient required I/O, continue Flomax 0.8 add finasteride 5 and reattempt removal on 2/5   Hypokalemia: Resolved.   Chronic Normocytic Anemia:  -Likely in the setting of CKD as above and acute infection -12/17/22 received 1 unit pRBC. - Monitoring regularly   Epistaxis:  - Seems to be self-inflicted, improved with trimming fingernails. - SCD applied--needs early ambulation - Recheck CBC - Continue po iron    Bipolar Disorder: - Mood is stable on zoloft 25 daily   Polysubstance abuse, OUD:  - Hx crack cocaine use. No current evidence of withdrawal - Continue home Suboxone 1 tab bid - Cessation counseling provided  Left foot injury -It appears patient has scratched his left foot with overlying scab.  - See wound note from 2/1.    Nita Sells, MD [facility] [email or web address] 01/25/2023, 1:35 PM

## 2023-01-25 NOTE — Progress Notes (Signed)
CHG Bath completed. Wound care for right foot: top and heel completed.  Patient tolerated well.  No drainage noted.  Continue to monitor.

## 2023-01-25 NOTE — Progress Notes (Signed)
Occupational Therapy Treatment Patient Details Name: Hunter Reyes MRN: [id number] DOB: 1976-10-04 Today's Date: 01/25/2023   History of present illness 47 y.o. male admitted 12/10 with AMS, urinary retention and low back pain. Underwent L4 lumbar laminectomy for epidural abscess 12/13. s/p R foot transmet amp 12/17. Pt initiated HD on 12/26. PMH:  bipolar 1 disorder, borderline personality disorder, COPD, crack cocaine abuse, depression, peripheral neuropathy, history of right foot osteomyelitis, panic attack and history of seizures   OT comments  Pt making steady progress towards OT goals this session. Pt continues to present with increased pain and back precautions impacting pts ability to complete ADLs independently. Pt currently requires min guard assist for ADL transfers with RW and supervision for LB ADLs via figure 4. Pt requires cues to recall 3/3 back precautions. Pt would continue to benefit from skilled occupational therapy while admitted and after d/c to address the below listed limitations in order to improve overall functional mobility and facilitate independence with BADL participation. DC plan remains appropriate, will follow acutely per POC.      Recommendations for follow up therapy are one component of a multi-disciplinary discharge planning process, led by the attending physician.  Recommendations may be updated based on patient status, additional functional criteria and insurance authorization.    Follow Up Recommendations  Skilled nursing-short term rehab (<3 hours/day)     Assistance Recommended at Discharge Intermittent Supervision/Assistance  Patient can return home with the following  A little help with walking and/or transfers;A little help with bathing/dressing/bathroom;Assistance with Education officer, environmental cushion (measurements OT);Wheelchair (measurements OT);Other (comment) (RW)    Recommendations for Other Services       Precautions / Restrictions Precautions Precautions: Fall;Back Precaution Booklet Issued: No Precaution Comments: able to state 2/2 back precautions Required Braces or Orthoses: Other Brace Other Brace: post op shoe- CAM boot Restrictions Weight Bearing Restrictions: Yes RLE Weight Bearing: Weight bearing as tolerated       Mobility Bed Mobility Overal bed mobility: Needs Assistance Bed Mobility: Rolling, Sidelying to Sit Rolling: Supervision Sidelying to sit: Supervision       General bed mobility comments: cues for sequencing    Transfers Overall transfer level: Needs assistance Equipment used: Rolling walker (2 wheels) Transfers: Sit to/from Stand, Bed to chair/wheelchair/BSC Sit to Stand: Min guard, From elevated surface Stand pivot transfers: Min guard, From elevated surface         General transfer comment: min guard to rise from elevated surface wtih RW, min guard to pivot to recliner with min cues for RW mgmt     Balance Overall balance assessment: Needs assistance Sitting-balance support: No upper extremity supported, Feet supported Sitting balance-[name] Scale: Good Sitting balance - Comments: able to complete LB dressing from EOB   Standing balance support: During functional activity, Bilateral upper extremity supported Standing balance-[name] Scale: Poor Standing balance comment: reliant on UE support when standing                           ADL either performed or assessed with clinical judgement   ADL Overall ADL's : Needs assistance/impaired Eating/Feeding: Independent;Sitting           Lower Body Bathing: Supervison/ safety;Set up;Cueing for compensatory techniques Lower Body Bathing Details (indicate cue type and reason): simulated via LB dressing via figure 4     Lower Body Dressing: Supervision/safety;Set up;Sitting/lateral leans;Cueing for compensatory techniques Lower Body Dressing Details (indicate cue type and  reason): able  to don CAM with cues for figure 4 from EOB Toilet Transfer: Min guard;Rolling walker (2 wheels);Stand-pivot Armed forces technical officer Details (indicate cue type and reason): simulated via stand pivot to recliner         Functional mobility during ADLs: Min guard;Cueing for safety;Rolling walker (2 wheels) General ADL Comments: ADL participation impacted by increased pai, back precautions and decreased activity tolerance    Extremity/Trunk Assessment Upper Extremity Assessment Upper Extremity Assessment: Generalized weakness   Lower Extremity Assessment Lower Extremity Assessment: Defer to PT evaluation   Cervical / Trunk Assessment Cervical / Trunk Assessment: Back Surgery    Vision Baseline Vision/History: 0 No visual deficits     Perception Perception Perception: Not tested   Praxis Praxis Praxis: Not tested    Cognition Arousal/Alertness: Awake/alert Behavior During Therapy: WFL for tasks assessed/performed Overall Cognitive Status: No family/caregiver present to determine baseline cognitive functioning Area of Impairment: Attention, Memory, Safety/judgement, Awareness, Problem solving                   Current Attention Level: Selective Memory: Decreased recall of precautions   Safety/Judgement: Decreased awareness of safety, Decreased awareness of deficits Awareness: Emergent Problem Solving: Slow processing, Requires verbal cues General Comments: pt selective with attention, decreased recall of back precautions, pleasant during session but demonstrating poor safety and jugement initally stating he didnt need CAM boot        Exercises      Shoulder Instructions       General Comments      Pertinent Vitals/ Pain       Pain Assessment Pain Assessment: Faces Faces Pain Scale: Hurts little more Pain Location: back Pain Descriptors / Indicators: Grimacing, Guarding Pain Intervention(s): Limited activity within patient's tolerance, Monitored during session,  Repositioned  Home Living                                          Prior Functioning/Environment              Frequency  Min 2X/week        Progress Toward Goals  OT Goals(current goals can now be found in the care plan section)  Progress towards OT goals: Progressing toward goals  Acute Rehab OT Goals Patient Stated Goal: to eat breakfast OT Goal Formulation: With patient Time For Goal Achievement: 02/05/23 Potential to Achieve Goals: Good  Plan Discharge plan remains appropriate;Frequency remains appropriate    Co-evaluation                 AM-PAC OT "6 Clicks" Daily Activity     Outcome Measure   Help from another person eating meals?: None Help from another person taking care of personal grooming?: A Little Help from another person toileting, which includes using toliet, bedpan, or urinal?: A Little Help from another person bathing (including washing, rinsing, drying)?: A Lot Help from another person to put on and taking off regular upper body clothing?: A Little Help from another person to put on and taking off regular lower body clothing?: A Little 6 Click Score: 18    End of Session Equipment Utilized During Treatment: Rolling walker (2 wheels)  OT Visit Diagnosis: Unsteadiness on feet (R26.81);Muscle weakness (generalized) (M62.81);Pain;Other symptoms and signs involving cognitive function;Other abnormalities of gait and mobility (R26.89) Pain - part of body:  (back)   Activity Tolerance Patient tolerated treatment well  Patient Left in chair;with call bell/phone within reach   Nurse Communication Mobility status        Time: 6168-3729 OT Time Calculation (min): 10 min  Charges: OT General Charges $OT Visit: 1 Visit OT Treatments $Self Care/Home Management : 8-22 mins  Harley Alto., COTA/L Acute Rehabilitation Services [phone]   Precious Haws 01/25/2023, 11:45 AM

## 2023-01-25 NOTE — Plan of Care (Signed)
  Problem: Education: Goal: Knowledge of General Education information will improve Description: Including pain rating scale, medication(s)/side effects and non-pharmacologic comfort measures Outcome: Progressing   Problem: Nutrition: Goal: Adequate nutrition will be maintained Outcome: Progressing   Problem: Elimination: Goal: Will not experience complications related to bowel motility Outcome: Progressing Goal: Will not experience complications related to urinary retention Outcome: Progressing   Problem: Pain Managment: Goal: General experience of comfort will improve Outcome: Progressing   Problem: Safety: Goal: Ability to remain free from injury will improve Outcome: Progressing   Problem: Education: Goal: Knowledge of the prescribed therapeutic regimen will improve Outcome: Progressing Goal: Understanding of discharge needs will improve Outcome: Progressing   Problem: Activity: Goal: Ability to perform//tolerate increased activity and mobilize with assistive devices will improve Outcome: Progressing

## 2023-01-25 NOTE — TOC Progression Note (Signed)
Transition of Care [facility]) - Progression Note    Patient Details  Name: CHICO CAWOOD MRN: [id number] Date of Birth: Jul 13, 1976  Transition of Care [facility] CM/SW Contact  Jinger Neighbors, [place] Phone Number: 01/25/2023, 4:25 PM  Clinical Narrative:     Pt currently on IV abx; per MD notes, pt will be here through 2/11. TOC following.   Expected Discharge Plan: [place] Barriers to Discharge: Continued Medical Work up  Expected Discharge Plan and [place] arrangements for the past 2 months: [place] Determinants of Health (SDOH) Interventions [place]: Food Insecurity Present (12/04/2022)  Housing: High Risk (12/04/2022)  Transportation Needs: Unmet Transportation Needs (12/04/2022)  Utilities: At Risk (12/04/2022)  Tobacco Use: High Risk (12/18/2022)    Readmission Risk Interventions     No data to display

## 2023-01-26 DIAGNOSIS — R7881 Bacteremia: Secondary | ICD-10-CM | POA: Diagnosis not present

## 2023-01-26 DIAGNOSIS — B9562 Methicillin resistant Staphylococcus aureus infection as the cause of diseases classified elsewhere: Secondary | ICD-10-CM | POA: Diagnosis not present

## 2023-01-26 MED ORDER — SODIUM CHLORIDE 0.9 % IV SOLN
8.0000 mg/kg | INTRAVENOUS | Status: DC
  Filled 2023-01-26: qty 17

## 2023-01-26 NOTE — Plan of Care (Signed)
  Problem: Fluid Volume: Goal: Hemodynamic stability will improve Outcome: Progressing   Problem: Clinical Measurements: Goal: Diagnostic test results will improve Outcome: Progressing Goal: Signs and symptoms of infection will decrease Outcome: Progressing   Problem: Respiratory: Goal: Ability to maintain adequate ventilation will improve Outcome: Progressing   Problem: Education: Goal: Knowledge of General Education information will improve Description: Including pain rating scale, medication(s)/side effects and non-pharmacologic comfort measures Outcome: Progressing   Problem: Health Behavior/Discharge Planning: Goal: Ability to manage health-related needs will improve Outcome: Progressing   Problem: Clinical Measurements: Goal: Ability to maintain clinical measurements within normal limits will improve Outcome: Progressing Goal: Respiratory complications will improve Outcome: Progressing Goal: Cardiovascular complication will be avoided Outcome: Progressing   Problem: Activity: Goal: Risk for activity intolerance will decrease Outcome: Progressing   Problem: Nutrition: Goal: Adequate nutrition will be maintained Outcome: Progressing   Problem: Coping: Goal: Level of anxiety will decrease Outcome: Progressing   Problem: Pain Managment: Goal: General experience of comfort will improve Outcome: Progressing   Problem: Safety: Goal: Ability to remain free from injury will improve Outcome: Progressing   Problem: Self-Care: Goal: Ability to meet self-care needs will improve Outcome: Progressing   Problem: Pain Management: Goal: Pain level will decrease with appropriate interventions Outcome: Progressing   Problem: Skin Integrity: Goal: Demonstration of wound healing without infection will improve Outcome: Progressing

## 2023-01-26 NOTE — Progress Notes (Signed)
[facility] PROGRESS NOTE  Hunter Reyes (DOB: 12/25/1975) TGG:[id number] PCP: Celene Squibb, MD  Brief Narrative: Hunter Reyes is a 47 y.o. male with a PMH of bipolar disorder, polysubstance abuse, COPD, chronic hepatitis B, and peripheral neuropathy who was admitted 12/02/2022 with acute renal failure with obstructive uropathy. Foley catheter was placed and the patient required dialysis intermittently up until 12/25/2022. Also found to have L4 epidural abscess with subacute symptoms of cauda equina syndrome necessitating a right L4 laminectomy and debridement 12/05/2022. Blood cultures grew MRSA. Additionally found to have abscess and osteomyelitis of the right foot for which transmetatarsal amputation was performed 12/09/2022. He continues on daptomycin with end date 02/03/2023 at which time he will need repeat lumbar spine imaging. Creatinine remains in the 4-5 range though with adequate output, not requiring dialysis.   Subjective:  Pleasant-watching TV "I need to talk to a Education officer, museum or something--I'm about to be discharged, I don't have any clothes or anything"  Objective: BP (!) 110/58 (BP Location: Right Arm)   Pulse 87   Temp 97.8 F (36.6 C) (Oral)   Resp 16   Ht 6\' 2"  (1.88 m)   Wt 135 kg   SpO2 98%   BMI 38.21 kg/m   pleasant white male looking younger than stated age Chest is clear no rales rhonchi wheeze S1-S2 no murmur Abdomen is soft Right foot examined 2/2--see note   Assessment & Plan:  MRSA bacteremia complicated by V0-J5 discitis  Epidural abscess s/p decompression/debridement 12/05/22 - Continue daptomycin with end date 2/11. MRI on 1/8 did not show full resolution of diskitis.  - MRI of Lumbar Spine W/WO contrast toward the end of therapy (approximately 2/8-2/9)   Right Foot Abscess and Osteomyelitis s/p 12/09/22 TMA:  - Margins were reported as clear intraoperatively. - Continue antibiotics as above per ID through 02/03/23 - WBAT in postop  shoe per Dr. Sharol Given, per my discussion with him on 1/31 - HEEL WOUND ON MY EXAM IMPROVED 2/2   AKI on stage IV CKD, resolved: Urinary obstruction, unspecified  - CrCl stabilized iSCr in 4-5 range. UOP adequate and electrolytes stable-need to watch phosphorus as patient noncompliant with renal diet - Avoid nephrotoxins, renally dose appropriate medications -Patient requesting regular diet we will allow the same --next labs will need phosphorus -Foley removal attempted 1/29 with no good result-patient required I/O, continue Flomax 0.8 add finasteride 5 and reattempt removal on 2/5 with next labs   Hypokalemia: Resolved.   Chronic Normocytic Anemia:  -Likely in the setting of CKD as above and acute infection -12/17/22 received 1 unit pRBC. - Monitoring regularly   Epistaxis:  - Seems to be self-inflicted, improved with trimming fingernails - SCD applied--needs early ambulation - Recheck CBC - Continue po iron    Bipolar Disorder: - Mood is stable on zoloft 25 daily   Polysubstance abuse, OUD:  - Hx crack cocaine use. No current evidence of withdrawal - Continue home Suboxone 1 tab bid, is on Oxycodone 5 q12 prn which we will be stopping ~01/28/23 - Cessation counseling provided  Left foot injury -It appears patient has scratched his left foot with overlying scab.  - See wound note from 2/1, my exam from 2/2  Nita Sells, MD [email or web address] 01/26/2023, 5:34 PM

## 2023-01-26 NOTE — Progress Notes (Addendum)
0030 Sediment noted in tubing of foley. Urine is tea colored and cloudy. Patient denies chills and pelvic/ABD pain.  No fever noted at this time.  Continue to monitor.  0500 No sediment noted in foley tubing.  Urine is amber in color and clear.  Patient denies chills and pelvic/ABD pain.  No fever noted at this time.  Continue to monitor.

## 2023-01-26 NOTE — Plan of Care (Signed)
  Problem: Education: Goal: Knowledge of General Education information will improve Description: Including pain rating scale, medication(s)/side effects and non-pharmacologic comfort measures Outcome: Progressing   Problem: Health Behavior/Discharge Planning: Goal: Ability to manage health-related needs will improve Outcome: Progressing   Problem: Activity: Goal: Risk for activity intolerance will decrease Outcome: Progressing   Problem: Pain Managment: Goal: General experience of comfort will improve Outcome: Progressing   Problem: Safety: Goal: Ability to remain free from injury will improve Outcome: Progressing   Problem: Skin Integrity: Goal: Risk for impaired skin integrity will decrease Outcome: Progressing   Problem: Education: Goal: Knowledge of the prescribed therapeutic regimen will improve Outcome: Progressing Goal: Understanding of discharge needs will improve Outcome: Progressing

## 2023-01-27 DIAGNOSIS — B9562 Methicillin resistant Staphylococcus aureus infection as the cause of diseases classified elsewhere: Secondary | ICD-10-CM | POA: Diagnosis not present

## 2023-01-27 DIAGNOSIS — R7881 Bacteremia: Secondary | ICD-10-CM | POA: Diagnosis not present

## 2023-01-27 MED ORDER — SODIUM CHLORIDE 0.9 % IV SOLN
8.0000 mg/kg | INTRAVENOUS | Status: DC
  Administered 2023-01-27 – 2023-02-02 (×4): 850 mg via INTRAVENOUS
  Filled 2023-01-27 (×4): qty 17

## 2023-01-27 NOTE — Progress Notes (Signed)
[facility] PROGRESS NOTE  Hunter Reyes (DOB: Dec 30, 1975) BVQ:[id number] PCP: Celene Squibb, MD  Brief Narrative:  47 y.o. male with a PMH of bipolar disorder, polysubstance abuse, COPD, chronic hepatitis B, and peripheral neuropathy who was admitted 12/02/2022 with acute renal failure with obstructive uropathy. Foley catheter was placed and the patient required dialysis intermittently up until 12/25/2022. Also found to have L4 epidural abscess with subacute symptoms of cauda equina syndrome necessitating a right L4 laminectomy and debridement 12/05/2022. Blood cultures grew MRSA. Additionally found to have abscess and osteomyelitis of the right foot for which transmetatarsal amputation was performed 12/09/2022. He continues on daptomycin with end date 02/03/2023 at which time he will need repeat lumbar spine imaging. Creatinine remains in the 4-5 range though with adequate output, not requiring dialysis.   Subjective:  Awake coherent No distress looks comfy patient wearing soft postop shoe today feels that CAM Gilford Rile is little bit too heavy  Objective: BP (!) 90/52 (BP Location: Left Arm)   Pulse 82   Temp 97.9 F (36.6 C) (Oral)   Resp 18   Ht 6\' 2"  (1.88 m)   Wt 135 kg   SpO2 99%   BMI 38.21 kg/m   pleasant white male looking younger than stated age Chest is clear no rales rhonchi wheeze S1-S2 no murmur Abdomen is soft Patient wearing soft shoes today-did not examine wound today   Assessment & Plan:  MRSA bacteremia complicated by C0-K3 discitis  Epidural abscess s/p decompression/debridement 12/05/22 - Continue daptomycin end date 2/11. MRI on 1/8 did not show full resolution of diskitis.  - MRI of Lumbar Spine W/WO contrast toward the end of therapy (approximately 2/8-2/9)   Right Foot Abscess and Osteomyelitis s/p 12/09/22 TMA:  - Margins were reported as clear intraoperatively. - Continue antibiotics as above per ID through 02/03/23 - WBAT in postop shoe per Dr.  Sharol Given, per my discussion with him on 1/31 - Wound examination again tomorrow   AKI on stage IV CKD, resolved: Urinary obstruction, unspecified  - CrCl stabilized iSCr in 4-5 range. UOP adequate and electrolytes stable-need to watch phosphorus as patient noncompliant with renal diet - Avoid nephrotoxins, renally dose appropriate medications -Patient requesting regular diet we will allow the same - next labs will need phosphorus - Foley removal attempted 1/29 with no good result-patient required I/O, continue Flomax 0.8 add finasteride 5  -Clamping trial 2/5    Hypokalemia: Resolved.   Chronic Normocytic Anemia:  -Likely in the setting of CKD as above and acute infection -12/17/22 received 1 unit pRBC. - Monitoring regularly   Epistaxis:  - Self-inflicted, improved with trimming fingernails - SCD applied--needs early ambulation - Recheck CBC - Continue po iron    Bipolar Disorder: - Mood is stable on zoloft 25 daily   Polysubstance abuse, OUD:  - Hx crack cocaine use. No current evidence of withdrawal - Continue home Suboxone 1 tab bid, is on Oxycodone 5 q12 prn which we will be stopping ~01/28/23 - Cessation counseling provided  Left foot injury -It appears patient has scratched his left foot with overlying scab.  - See wound note from 2/1, my exam from 2/2  Nita Sells, MD [email or web address] 01/27/2023, 3:37 PM

## 2023-01-27 NOTE — Plan of Care (Signed)
  Problem: Clinical Measurements: Goal: Signs and symptoms of infection will decrease Outcome: Progressing   Problem: Respiratory: Goal: Ability to maintain adequate ventilation will improve Outcome: Progressing   Problem: Health Behavior/Discharge Planning: Goal: Ability to manage health-related needs will improve Outcome: Progressing   Problem: Clinical Measurements: Goal: Ability to maintain clinical measurements within normal limits will improve Outcome: Progressing Goal: Will remain free from infection Outcome: Progressing Goal: Diagnostic test results will improve Outcome: Progressing Goal: Respiratory complications will improve Outcome: Progressing Goal: Cardiovascular complication will be avoided Outcome: Progressing

## 2023-01-28 DIAGNOSIS — R7881 Bacteremia: Secondary | ICD-10-CM | POA: Diagnosis not present

## 2023-01-28 DIAGNOSIS — B9562 Methicillin resistant Staphylococcus aureus infection as the cause of diseases classified elsewhere: Secondary | ICD-10-CM | POA: Diagnosis not present

## 2023-01-28 LAB — RENAL FUNCTION PANEL
Albumin: 3.1 g/dL — ABNORMAL LOW (ref 3.5–5.0)
Anion gap: 11 (ref 5–15)
BUN: 60 mg/dL — ABNORMAL HIGH (ref 6–20)
CO2: 20 mmol/L — ABNORMAL LOW (ref 22–32)
Calcium: 9.1 mg/dL (ref 8.9–10.3)
Chloride: 101 mmol/L (ref 98–111)
Creatinine, Ser: 4.24 mg/dL — ABNORMAL HIGH (ref 0.61–1.24)
GFR, Estimated: 17 mL/min — ABNORMAL LOW (ref >60)
Glucose, Bld: 97 mg/dL (ref 70–99)
Phosphorus: 5.2 mg/dL — ABNORMAL HIGH (ref 2.5–4.6)
Potassium: 4 mmol/L (ref 3.5–5.1)
Sodium: 132 mmol/L — ABNORMAL LOW (ref 135–145)

## 2023-01-28 LAB — HEPATIC FUNCTION PANEL
ALT: 14 U/L (ref 0–44)
AST: 14 U/L — ABNORMAL LOW (ref 15–41)
Albumin: 3.1 g/dL — ABNORMAL LOW (ref 3.5–5.0)
Alkaline Phosphatase: 55 U/L (ref 38–126)
Bilirubin, Direct: <0.1 mg/dL (ref 0.0–0.2)
Total Bilirubin: 0.3 mg/dL (ref 0.3–1.2)
Total Protein: 7.3 g/dL (ref 6.5–8.1)

## 2023-01-28 LAB — CK: Total CK: 39 U/L — ABNORMAL LOW (ref 49–397)

## 2023-01-28 NOTE — Progress Notes (Signed)
[facility] PROGRESS NOTE  Hunter Reyes (DOB: [birth year]) [id number] PCP: Celene Squibb, MD  Brief Narrative:  47 y.o. PMH of bipolar disorder, polysubstance abuse, COPD, chronic hepatitis B, and peripheral neuropathy who was admitted 12/02/2022 with acute renal failure with obstructive uropathy. Foley catheter was placed and the patient required dialysis intermittently up until 12/25/2022. Also found to have L4 epidural abscess with subacute symptoms of cauda equina syndrome necessitating a right L4 laminectomy and debridement 12/05/2022. Blood cultures grew MRSA. Additionally found to have abscess and osteomyelitis of the right foot for which transmetatarsal amputation was performed 12/09/2022. He continues on daptomycin with end date 02/03/2023 at which time he will need repeat lumbar spine imaging. Creatinine remains in the 4-5 range though with adequate output, not requiring dialysis.   Subjective:  Awake coherent no distress no cp fever sob Voiding trial failed on 2/5 AM after an earlier trial last month in the hospital--Foley had to be replaced Also c/o Lower Jaw pain--mouth looks chronic however when I examine inside  Objective: BP 116/76 (BP Location: Right Arm)   Pulse 88   Temp 98 F (36.7 C) (Oral)   Resp 17   Ht 6\' 2"  (1.88 m)   Wt 135 kg   SpO2 100%   BMI 38.21 kg/m   pleasant white male looking younger than stated age--- patient has very poor dentition to lower jaw however these all appear to be chronic findings I do not see any acute bleeding or any new changes and although his submental area is slightly warm it is warm in general not just 1 area and I do not appreciate swelling Chest is clear no rales rhonchi wheeze S1-S2 no murmur Abdomen is soft Patient wearing soft shoes today-wound examined today-patient has nonabsorbable sutures across the top of foot, his heel seems well-healed from the prior injuries that were described in wound care note Overall  looks well   Assessment & Plan:  MRSA bacteremia complicated by Q1-J9 discitis  Epidural abscess s/p decompression/debridement 12/05/22 - Cont daptomycin end date 2/11. MRI on 1/8 did not show full resolution of diskitis.  - MRI of Lumbar Spine W/WO contrast toward the end of therapy (approximately 2/8-2/9)   Right Foot Abscess and Osteomyelitis s/p 12/09/22 TMA:  - Margins were reported as clear intraoperatively. - Daptomycin through 02/03/23 - WBAT in postop shoe per Dr. Sharol Given, per my discussion with him on 1/31 -Doppler foot looks like it is healing well stitches are still in place and likely can come out-will discuss Dr. Sharol Given and see if it is feasible to remove   AKI on stage IV CKD, resolved: Urinary obstruction, unspecified  - CrCl stabilized iSCr in 4-5 range. UOP adequate and electrolytes stable-need to watch phosphorus as patient noncompliant with renal diet - Avoid nephrotoxins, renally dose appropriate medications -Patient requesting regular diet we will allow the same--phosphorus is slightly elevated at 5.2 but not necessitating binders yet - Foley removal attempted 1/29 with no good result-patient required I/O, continue Flomax 0.8 add finasteride 5  -Failed clamping trial 2/5 -Will reach out to nephrology to ensure has outpatient follow-up with them given creatinine clearance 17 and will need eventual planning for iHD   Hypokalemia: Resolved.   Chronic Normocytic Anemia:  -Likely in the setting of CKD as above and acute infection -12/17/22 received 1 unit pRBC. - Monitoring regularly   Epistaxis:  - Self-inflicted, improved with trimming fingernails - SCD applied--needs early ambulation - Recheck CBC - Continue po iron  Bipolar Disorder: - Mood is stable on zoloft 25 daily   Polysubstance abuse, OUD:  - Hx crack cocaine use. No current evidence of withdrawal - Continue home Suboxone 1 tab bid, is on Oxycodone 5 q12 prn which we will be stopping ~01/28/23 -  Cessation counseling provided  Left foot injury -It appears patient has scratched his left foot with overlying scab.  - See wound note from 2/1, my exam from 2/2  Nita Sells, MD [email or web address] 01/28/2023, 3:04 PM

## 2023-01-28 NOTE — Progress Notes (Addendum)
Patient sutures removed from R foot by RN per order, patient tolerated well. Area then cleansed and dressed appropriately.

## 2023-01-28 NOTE — Progress Notes (Signed)
Physical Therapy Treatment Patient Details Name: Hunter Reyes MRN: [id number] DOB: [birth year] Today's Date: 01/28/2023   History of Present Illness 47 y.o. male admitted 12/10 with AMS, urinary retention and low back pain. Underwent L4 lumbar laminectomy for epidural abscess 12/13. s/p R foot transmet amp 12/17. Pt initiated HD on 12/26. PMH:  bipolar 1 disorder, borderline personality disorder, COPD, crack cocaine abuse, depression, peripheral neuropathy, history of right foot osteomyelitis, panic attack and history of seizures    PT Comments    Continuing work on functional mobility and activity tolerance;  session focused on functional transfers and ambulation; pt focused on trying to void to avoid the need for a foley; Assisted pt to the bathroom with RW and cam boot;   Pt voiced that he would like to go to SNF for rehab, but he can't for financial reasons   Recommendations for follow up therapy are one component of a multi-disciplinary discharge planning process, led by the attending physician.  Recommendations may be updated based on patient status, additional functional criteria and insurance authorization.  Follow Up Recommendations  Home health PT (Pt declining sNF dur to financial concerns; Likely insurance cannot support HHPT follow up - in that case, Outpt PT) Can patient physically be transported by private vehicle: Yes   Assistance Recommended at Discharge Frequent or constant Supervision/Assistance  Patient can return home with the following Assistance with cooking/housework;Assist for transportation;Help with stairs or ramp for entrance;A lot of help with walking and/or transfers;A little help with bathing/dressing/bathroom   Equipment Recommendations  Rolling walker (2 wheels);BSC/3in1;Wheelchair (measurements PT)    Recommendations for Other Services       Precautions / Restrictions Precautions Precautions: Fall;Back Precaution Booklet Issued: No Precaution  Comments: Able to state precautions Required Braces or Orthoses: Other Brace Other Brace: post op shoe- CAM boot R foot Restrictions Weight Bearing Restrictions: No RLE Weight Bearing: Weight bearing as tolerated Other Position/Activity Restrictions: with post op shoe- updated weight bearing per Dr. Sharol Given on 01/15/23     Mobility  Bed Mobility Overal bed mobility: Needs Assistance Bed Mobility: Rolling, Sidelying to Sit Rolling: Supervision Sidelying to sit: Supervision       General bed mobility comments: cues for sequencing    Transfers Overall transfer level: Needs assistance Equipment used: Rolling walker (2 wheels) Transfers: Sit to/from Stand Sit to Stand: Min guard, Min assist           General transfer comment: minguard to stand from EOB; Min assist to stand from low commode to steady RW; pt pulled up on RW    Ambulation/Gait Ambulation/Gait assistance: Min assist, Min guard Gait Distance (Feet): 30 Feet (to adn from bathroom) Assistive device: Rolling walker (2 wheels) Gait Pattern/deviations: Step-through pattern, Decreased stride length, Trunk flexed Gait velocity: decr     General Gait Details: Amb limited today by pt's focus on need to void; R Quad discomfort he experienced previous PT session persisted today   Stairs             Wheelchair Mobility    Modified Rankin (Stroke Patients Only)       Balance     Sitting balance-[name] Scale: Good Sitting balance - Comments: Donned cam boot sitting EOB with assist     Standing balance-[name] Scale: Poor Standing balance comment: reliant on UE support when standing                            Cognition  Arousal/Alertness: Awake/alert Behavior During Therapy: WFL for tasks assessed/performed Overall Cognitive Status: No family/caregiver present to determine baseline cognitive functioning Area of Impairment: Attention, Memory, Safety/judgement, Awareness, Problem solving                      Memory: Decreased recall of precautions Following Commands: Follows one step commands with increased time Safety/Judgement: Decreased awareness of safety, Decreased awareness of deficits Awareness: Emergent Problem Solving: Slow processing, Requires verbal cues General Comments: pt selective with attention, decreased recall of back precautions, pleasant during session        Exercises      General Comments        Pertinent Vitals/Pain Pain Assessment Pain Assessment: 0-10 Pain Score: 8  Pain Location: back Pain Descriptors / Indicators: Grimacing, Guarding Pain Intervention(s): Monitored during session, Premedicated before session    Home Living                          Prior Function            PT Goals (current goals can now be found in the care plan section) Acute Rehab PT Goals Patient Stated Goal: home PT Goal Formulation: With patient Time For Goal Achievement: 02/04/23 Potential to Achieve Goals: Fair Progress towards PT goals: Progressing toward goals    Frequency    Min 3X/week      PT Plan Current plan remains appropriate    Co-evaluation              AM-PAC PT "6 Clicks" Mobility   Outcome Measure  Help needed turning from your back to your side while in a flat bed without using bedrails?: None Help needed moving from lying on your back to sitting on the side of a flat bed without using bedrails?: None Help needed moving to and from a bed to a chair (including a wheelchair)?: A Little Help needed standing up from a chair using your arms (e.g., wheelchair or bedside chair)?: A Little Help needed to walk in hospital room?: A Little Help needed climbing 3-5 steps with a railing? : A Lot 6 Click Score: 19    End of Session Equipment Utilized During Treatment: Gait belt Activity Tolerance: Patient tolerated treatment well Patient left: in bed;with call bell/phone within reach Nurse Communication: Mobility  status PT Visit Diagnosis: Muscle weakness (generalized) (M62.81);Pain;Other abnormalities of gait and mobility (R26.89) Pain - Right/Left: Right Pain - part of body: Ankle and joints of foot     Time: 1215 (minus approx 10 minutes while pt in bathroom)-1300 PT Time Calculation (min) (ACUTE ONLY): 45 min  Charges:  $Gait Training: 8-22 mins $Therapeutic Activity: 8-22 mins                     Roney Marion, [place] [facility] [place] 01/28/2023, 5:52 PM

## 2023-01-28 NOTE — TOC Initial Note (Signed)
Transition of Care [facility]) - Initial/Assessment Note    Patient Details  Name: Hunter Reyes MRN: [id number] Date of Birth: [birth year]  Transition of Care [facility]) CM/SW Contact:    Jinger Neighbors, LCSW Phone Number: 01/28/2023, 10:59 AM  Clinical Narrative:                  CSW met with pt to complete assessment. Pt reports he currently resides in a single family home with his brother and sister in law and plans to return when he is discharged. CSW discussed the recommendations for SNF; however, pt reports he will not be able to give up his check for a month because his family relies on his check to pay bills. He reports he is agreeable to OPT PT/OT and will utilize Medicaid transportation to get to appointments. Pt prefers OPT PT in [place], close to where he resides.   Expected Discharge Plan: Skilled Nursing Facility Barriers to Discharge: Continued Medical Work up   Patient Goals and CMS Choice Patient states their goals for this hospitalization and ongoing recovery are:: return home CMS Medicare.gov Compare Post Acute Care list provided to:: Patient Choice offered to / list presented to : Patient      Expected Discharge Plan and Services     Post Acute Care Choice:  (Outpatient PT) Living arrangements for the past 2 months: Single Family Home                                      Prior Living Arrangements/Services Living arrangements for the past 2 months: Single Family Home Lives with:: Relatives Patient language and need for interpreter reviewed:: Yes Do you feel safe going back to the place where you live?: Yes      Need for Family Participation in Patient Care: Yes (Comment) Care giver support system in place?: Yes (comment)   Criminal Activity/Legal Involvement Pertinent to Current Situation/Hospitalization: No - Comment as needed  Activities of Daily Living Home Assistive Devices/Equipment: None ADL Screening (condition at time of admission) Patient's  cognitive ability adequate to safely complete daily activities?: Yes Is the patient deaf or have difficulty hearing?: No Does the patient have difficulty seeing, even when wearing glasses/contacts?: No Does the patient have difficulty concentrating, remembering, or making decisions?: No Patient able to express need for assistance with ADLs?: Yes Does the patient have difficulty dressing or bathing?: Yes Independently performs ADLs?: No Communication: Independent Dressing (OT): Needs assistance Grooming: Needs assistance Feeding: Independent Bathing: Needs assistance Toileting: Needs assistance In/Out Bed: Dependent Walks in Home: Needs assistance Does the patient have difficulty walking or climbing stairs?: Yes Weakness of Legs: Both Weakness of Arms/Hands: Both  Permission Sought/Granted                  Emotional Assessment Appearance:: Appears stated age Attitude/Demeanor/Rapport: Gracious Affect (typically observed): Appropriate Orientation: : Oriented to Self, Oriented to Place, Oriented to  Time, Oriented to Situation Alcohol / Substance Use: Not Applicable Psych Involvement: No (comment)  Admission diagnosis:  AKI (acute kidney injury) ([place]) [N17.9] Acute renal failure, unspecified acute renal failure type [facility]) [N17.9] Patient Active Problem List   Diagnosis Date Noted   Epidural abscess 01/01/2023   Opioid dependence in remission ([place]) 12/18/2022   Acute anemia 12/17/2022   Hyperkalemia 12/16/2022   H/O laminectomy 12/15/2022   Acute urinary retention 12/15/2022   Acute renal failure ([place]) 12/15/2022  Cutaneous abscess of right foot 12/09/2022   MRSA bacteremia 12/05/2022   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 ([place]) 09/79/4997   Metabolic acidosis 18/20/9906   AKI (acute kidney injury) ([place]) 12/03/2022   Diskitis 12/03/2022   Bipolar 1 disorder ([place]) 12/03/2022   Peripheral neuropathy 12/03/2022   Polysubstance abuse ([place]) 12/03/2022   Dyspnea  on exertion 09/30/2017   Breast mass in male    Loss of weight 04/30/2013   Tobacco abuse    Foot ulcer ([place]) 04/29/2013   Osteomyelitis ([place]) 04/29/2013   Obesity, unspecified 04/29/2013   PCP:  Celene Squibb, MD Pharmacy:   [facility], [place] [address] [place] [address] Phone: [phone] Fax: [phone]  [facility] [address] [place] [address] Phone: [phone] Fax: [phone]     Social Determinants of Health (SDOH) Social History: [place]: Food Insecurity Present (12/04/2022)  Housing: High Risk (12/04/2022)  Transportation Needs: Unmet Transportation Needs (12/04/2022)  Utilities: At Risk (12/04/2022)  Tobacco Use: High Risk (12/18/2022)   SDOH Interventions:     Readmission Risk Interventions     No data to display

## 2023-01-29 DIAGNOSIS — B9562 Methicillin resistant Staphylococcus aureus infection as the cause of diseases classified elsewhere: Secondary | ICD-10-CM | POA: Diagnosis not present

## 2023-01-29 DIAGNOSIS — R7881 Bacteremia: Secondary | ICD-10-CM | POA: Diagnosis not present

## 2023-01-29 MED ORDER — OXYCODONE HCL 5 MG PO TABS
2.5000 mg | ORAL_TABLET | Freq: Two times a day (BID) | ORAL | Status: DC | PRN
  Administered 2023-01-30 – 2023-01-31 (×2): 2.5 mg via ORAL
  Filled 2023-01-29 (×2): qty 1

## 2023-01-29 NOTE — Progress Notes (Signed)
Physical Therapy Treatment Patient Details Name: Hunter Reyes MRN: [id number] DOB: March 30, 1976 Today's Date: 01/29/2023   History of Present Illness 47 y.o. male admitted 12/10 with AMS, urinary retention and low back pain. Underwent L4 lumbar laminectomy for epidural abscess 12/13. s/p R foot transmet amp 12/17. Pt initiated HD on 12/26. PMH:  bipolar 1 disorder, borderline personality disorder, COPD, crack cocaine abuse, depression, peripheral neuropathy, history of right foot osteomyelitis, panic attack and history of seizures    PT Comments    Continuing work on functional mobility and activity tolerance;  Session focused on progressive amb, as amb distance was limited yesterday; He was goal-oriented, and wanted to walk the distance to and from the nurse's station, which he achieved; Trunk significantly flexed throughout walk, pt with low back pain, mostly on the L side   Recommendations for follow up therapy are one component of a multi-disciplinary discharge planning process, led by the attending physician.  Recommendations may be updated based on patient status, additional functional criteria and insurance authorization.  Follow Up Recommendations  Home health PT (Pt declining sNF dur to financial concerns; Likely insurance cannot support HHPT follow up - in that case, Outpt PT) Can patient physically be transported by private vehicle: Yes   Assistance Recommended at Discharge Frequent or constant Supervision/Assistance  Patient can return home with the following Assistance with cooking/housework;Assist for transportation;Help with stairs or ramp for entrance;A lot of help with walking and/or transfers;A little help with bathing/dressing/bathroom   Equipment Recommendations  Rolling walker (2 wheels);BSC/3in1;Wheelchair (measurements PT)    Recommendations for Other Services       Precautions / Restrictions Precautions Precautions: Fall;Back Precaution Comments: Able to state  precautions; noted some difficulty putting precautions into practice with functional mobility Required Braces or Orthoses: Other Brace Other Brace: post op shoe- CAM boot R foot Restrictions Weight Bearing Restrictions: No Other Position/Activity Restrictions: with post op shoe- updated weight bearing per Dr. Sharol Given on 01/15/23     Mobility  Bed Mobility                    Transfers Overall transfer level: Needs assistance Equipment used: Rolling walker (2 wheels) Transfers: Sit to/from Stand Sit to Stand: Min guard           General transfer comment: Minguard for safety, but did not need physical assist; Painful today and had difficulty coming to fully upright posture    Ambulation/Gait Ambulation/Gait assistance: Min assist, Min guard Gait Distance (Feet): 100 Feet (x2) Assistive device: Rolling walker (2 wheels) Gait Pattern/deviations: Step-through pattern, Decreased stride length, Trunk flexed Gait velocity: decr     General Gait Details: Pt nervous about walking, had an episode of knee buckle earlier at the sink; Still, good use of RW for support with min/mod cues for rW proximity, and near Max cues for upright posture   Stairs             Wheelchair Mobility    Modified Rankin (Stroke Patients Only)       Balance     Sitting balance-[name] Scale: Good       Standing balance-[name] Scale: Poor                              Cognition Arousal/Alertness: Awake/alert Behavior During Therapy: WFL for tasks assessed/performed Overall Cognitive Status: No family/caregiver present to determine baseline cognitive functioning Area of Impairment: Attention, Memory, Safety/judgement, Awareness, Problem solving  Memory: Decreased recall of precautions Following Commands: Follows one step commands with increased time Safety/Judgement: Decreased awareness of safety, Decreased awareness of deficits Awareness:  Emergent Problem Solving: Slow processing, Requires verbal cues General Comments: pt selective with attention, decreased recall of back precautions, pleasant during session        Exercises      General Comments        Pertinent Vitals/Pain Pain Assessment Pain Assessment: Faces Faces Pain Scale: Hurts even more Pain Location: back, L side Pain Descriptors / Indicators: Grimacing, Guarding Pain Intervention(s): Monitored during session, Premedicated before session    Home Living                          Prior Function            PT Goals (current goals can now be found in the care plan section) Acute Rehab PT Goals Patient Stated Goal: home PT Goal Formulation: With patient Time For Goal Achievement: 02/04/23 Potential to Achieve Goals: Fair Progress towards PT goals: Progressing toward goals    Frequency    Min 3X/week      PT Plan Current plan remains appropriate    Co-evaluation              AM-PAC PT "6 Clicks" Mobility   Outcome Measure  Help needed turning from your back to your side while in a flat bed without using bedrails?: None Help needed moving from lying on your back to sitting on the side of a flat bed without using bedrails?: None Help needed moving to and from a bed to a chair (including a wheelchair)?: A Little Help needed standing up from a chair using your arms (e.g., wheelchair or bedside chair)?: A Little Help needed to walk in hospital room?: A Little Help needed climbing 3-5 steps with a railing? : A Lot 6 Click Score: 19    End of Session Equipment Utilized During Treatment: Gait belt Activity Tolerance: Patient tolerated treatment well Patient left: in chair;with call bell/phone within reach Nurse Communication: Mobility status PT Visit Diagnosis: Muscle weakness (generalized) (M62.81);Pain;Other abnormalities of gait and mobility (R26.89) Pain - Right/Left: Right Pain - part of body: Ankle and joints of  foot     Time: 1114-1140 PT Time Calculation (min) (ACUTE ONLY): 26 min  Charges:  $Gait Training: 23-37 mins                     Roney Marion, [place] Office [phone]    Colletta [place] 01/29/2023, 3:22 PM

## 2023-01-29 NOTE — Progress Notes (Signed)
[facility] PROGRESS NOTE  Hunter Reyes (DOB: [birth year]) JWJ:[id number] PCP: Celene Squibb, MD  Brief Narrative:  47 y.o. PMH of bipolar disorder, polysubstance abuse, COPD, chronic hepatitis B, and peripheral neuropathy who was admitted 12/02/2022 with acute renal failure with obstructive uropathy. Foley catheter was placed and the patient required dialysis intermittently up until 12/25/2022. Also found to have L4 epidural abscess with subacute symptoms of cauda equina syndrome necessitating a right L4 laminectomy and debridement 12/05/2022. Blood cultures grew MRSA. Additionally found to have abscess and osteomyelitis of the right foot for which transmetatarsal amputation was performed 12/09/2022. He continues on daptomycin with end date 02/03/2023 at which time he will need repeat lumbar spine imaging. Creatinine remains in the 4-5 range though with adequate output, not requiring dialysis.   Subjective:  Pleasant mood today no distress in the process of getting dressing change no fever no chills no nausea Was not able to ambulate with the cam walker-therapist suspects leg length discrepancy acquired from cam walker Nurse will look into getting the patient shoe for the left leg   Objective: BP 109/63 (BP Location: Left Arm)   Pulse 75   Temp 98 F (36.7 C) (Oral)   Resp 18   Ht 6\' 2"  (1.88 m)   Wt 135 kg   SpO2 100%   BMI 38.21 kg/m    Coherent no distress EOMI NCAT no focal deficit S1-S2 no murmur no rub no gallop Chest is clear no rales no rhonchi Very poor dentition Abdomen is soft nontender no rebound no guarding Did not examine lower extremities today  Assessment & Plan:  MRSA bacteremia complicated by A2-Z3 discitis  Epidural abscess s/p decompression/debridement 12/05/22 - Cont daptomycin end date 2/11. MRI on 1/8 did not show full resolution of diskitis.  - MRI of Lumbar Spine W/WO contrast toward the end of therapy (approximately 2/8-2/9)   Right Foot  Abscess and Osteomyelitis s/p 12/09/22 TMA:  - Margins were reported as clear intraoperatively. - Daptomycin through 02/03/23 - WBAT in postop shoe per Dr. Sharol Given, per my discussion with him on 1/31 -Sutures removed on 2/5 by RN-wound looks clean-outpatient follow-up with Dr. Sharol Given   AKI on stage IV CKD, resolved: Urinary obstruction, unspecified  - CrCl stabilized iSCr in 4-5 range. UOP adequate and electrolytes stable-need to watch phosphorus as patient noncompliant with renal diet - Avoid nephrotoxins, renally dose appropriate medications -Patient requesting regular diet we will allow the same--phosphorus is slightly elevated at 5.2 but not necessitating binders yet - Foley removal attempted 1/29 with no good result-patient required I/O, continue Flomax 0.8 add finasteride 5  -Failed clamping trial 2/5 - AVSS in place follow-up with nephrology-D/W Dr. Johnney Ou   Hypokalemia: Resolved.   Chronic Normocytic Anemia:  -Likely in the setting of CKD as above and acute infection -12/17/22 received 1 unit pRBC. - Monitoring regularly   Epistaxis:  - Self-inflicted, improved with trimming fingernails - SCD applied--needs early ambulation - Recheck CBC - Continue po iron    Bipolar Disorder: - Mood is stable on zoloft 25 daily   Polysubstance abuse, OUD:  - Hx crack cocaine use. No current evidence of withdrawal - Continue home Suboxone 1 tab bid, is on Oxycodone 5 q12 prn  -He will need over the next 1 to 2 days off of his oxycodone - Cessation counseling provided  Unstageable pressure ulcer to right heel and foot Left foot injury -It appears patient has scratched his left foot with overlying scab.  - See wound  note from 2/ 6, continue Medihoney to the heel and Xeroform to the transmet site  Nita Sells, MD [facility] [email or web address] 01/29/2023, 6:50 PM

## 2023-01-29 NOTE — Consult Note (Signed)
[facility] Nurse wound follow up Wound type: Unstageable pressure injury and surgical wound right heel/right foot Measurement:right heel-0.3cm x 0.3cm x 0.1cm  Wound bed:80% pink/20% slough in the base  Drainage (amount, consistency, odor) scant  Periwound: intact  Dressing procedure/placement/frequency: Continue dressings as ordered Medihoney to the heel and xeroform to the transmet site.  Change daily.  Offload heel as much as possible on pillows or with orthotic boot in the room  [place] Nurse team will follow with you and see patient within 10 days for wound assessments.  Please notify [place] nurses of any acute changes in the wounds or any new areas of concern [facility] MSN, [place], [place], [place]

## 2023-01-29 NOTE — TOC Progression Note (Signed)
Transition of Care [facility]) - Progression Note    Patient Details  Name: Hunter Reyes MRN: [id number] Date of Birth: Jul 26, 1976  [facility] CM/SW Contact  Carles Collet, RN Phone Number: 01/29/2023, 8:24 AM  Clinical Narrative:     Anticipate DC 2/11 when IV Abx course complete.  Patient declining SNF per previous TOC and PT notes, and prefers OP svs in [place], added to AVS.  Referral [phone] for outpatient PT and OT to [facility] made. Patient needs assessed for DME needs. PT/ OT recommending RW, BSC, WC.   Expected Discharge Plan: [place] Barriers to Discharge: Continued Medical Work up  Expected Discharge Plan and Services     Post Acute Care Choice:  (Outpatient PT) Living arrangements for the past 2 months: [place] Determinants of Health (SDOH) Interventions [place]: Food Insecurity Present (12/04/2022)  Housing: High Risk (12/04/2022)  Transportation Needs: Unmet Transportation Needs (12/04/2022)  Utilities: At Risk (12/04/2022)  Tobacco Use: High Risk (12/18/2022)    Readmission Risk Interventions     No data to display

## 2023-01-30 ENCOUNTER — Inpatient Hospital Stay (HOSPITAL_COMMUNITY): Payer: Medicaid Other

## 2023-01-30 DIAGNOSIS — B9562 Methicillin resistant Staphylococcus aureus infection as the cause of diseases classified elsewhere: Secondary | ICD-10-CM | POA: Diagnosis not present

## 2023-01-30 DIAGNOSIS — R7881 Bacteremia: Secondary | ICD-10-CM | POA: Diagnosis not present

## 2023-01-30 LAB — BASIC METABOLIC PANEL WITH GFR
Anion gap: 13 (ref 5–15)
BUN: 51 mg/dL — ABNORMAL HIGH (ref 6–20)
CO2: 21 mmol/L — ABNORMAL LOW (ref 22–32)
Calcium: 8.9 mg/dL (ref 8.9–10.3)
Chloride: 102 mmol/L (ref 98–111)
Creatinine, Ser: 4.06 mg/dL — ABNORMAL HIGH (ref 0.61–1.24)
GFR, Estimated: 18 mL/min — ABNORMAL LOW
Glucose, Bld: 103 mg/dL — ABNORMAL HIGH (ref 70–99)
Potassium: 3.8 mmol/L (ref 3.5–5.1)
Sodium: 136 mmol/L (ref 135–145)

## 2023-01-30 LAB — CBC
HCT: 21.9 % — ABNORMAL LOW (ref 39.0–52.0)
Hemoglobin: 7.3 g/dL — ABNORMAL LOW (ref 13.0–17.0)
MCH: 29.4 pg (ref 26.0–34.0)
MCHC: 33.3 g/dL (ref 30.0–36.0)
MCV: 88.3 fL (ref 80.0–100.0)
Platelets: 244 10*3/uL (ref 150–400)
RBC: 2.48 MIL/uL — ABNORMAL LOW (ref 4.22–5.81)
RDW: 14.2 % (ref 11.5–15.5)
WBC: 10.4 10*3/uL (ref 4.0–10.5)
nRBC: 0 % (ref 0.0–0.2)

## 2023-01-30 MED ORDER — GADOBUTROL 1 MMOL/ML IV SOLN
10.0000 mL | Freq: Once | INTRAVENOUS | Status: AC | PRN
  Administered 2023-01-30: 10 mL via INTRAVENOUS

## 2023-01-30 NOTE — Progress Notes (Signed)
Occupational Therapy Treatment Patient Details Name: Hunter Reyes MRN: [id number] DOB: Sep 24, 1976 Today's Date: 01/30/2023   History of present illness 47 y.o. male admitted 12/10 with AMS, urinary retention and low back pain. Underwent L4 lumbar laminectomy for epidural abscess 12/13. s/p R foot transmet amp 12/17. Pt initiated HD on 12/26. PMH:  bipolar 1 disorder, borderline personality disorder, COPD, crack cocaine abuse, depression, peripheral neuropathy, history of right foot osteomyelitis, panic attack and history of seizures   OT comments  Patient continues to make good gains with OT treatment with patient able to donn CAM boot with figure four position and assistance for lower straps. Patient able to ambulate to bathroom and perform toilet hygiene without assistance. Patient performed grooming standing at sink with min guard but unable to complete standing due to back pain. Patient discharge recommendations continue to be appropriate for SNF for continue rehab to increase independence with self care and increase standing tolerance and balance.    Recommendations for follow up therapy are one component of a multi-disciplinary discharge planning process, led by the attending physician.  Recommendations may be updated based on patient status, additional functional criteria and insurance authorization.    Follow Up Recommendations  Skilled nursing-short term rehab (<3 hours/day)     Assistance Recommended at Discharge Intermittent Supervision/Assistance  Patient can return home with the following  A little help with walking and/or transfers;A little help with bathing/dressing/bathroom;Assistance with Education officer, environmental cushion (measurements OT);Wheelchair (measurements OT);Other (comment) (RW)    Recommendations for Other Services      Precautions / Restrictions Precautions Precautions: Fall;Back Precaution Comments: Able to state  precautions; noted some difficulty putting precautions into practice with functional mobility Required Braces or Orthoses: Other Brace Other Brace: post op shoe- CAM boot R foot Restrictions Weight Bearing Restrictions: No RLE Weight Bearing: Weight bearing as tolerated Other Position/Activity Restrictions: with post op shoe- updated weight bearing per Dr. Sharol Given on 01/15/23       Mobility Bed Mobility Overal bed mobility: Needs Assistance Bed Mobility: Rolling, Sidelying to Sit Rolling: Supervision Sidelying to sit: Supervision       General bed mobility comments: cues for sequencing    Transfers Overall transfer level: Needs assistance Equipment used: Rolling walker (2 wheels) Transfers: Sit to/from Stand Sit to Stand: Min guard           General transfer comment: min guard for safety     Balance Overall balance assessment: Needs assistance Sitting-balance support: No upper extremity supported, Feet supported Sitting balance-[name] Scale: Good     Standing balance support: During functional activity, Bilateral upper extremity supported Standing balance-[name] Scale: Poor Standing balance comment: able to stand at sink for grooming tasks but limited standing tolerance due to back pain                           ADL either performed or assessed with clinical judgement   ADL Overall ADL's : Needs assistance/impaired     Grooming: Wash/dry hands;Wash/dry face;Oral care;Set up;Min guard;Sitting;Standing Grooming Details (indicate cue type and reason): able to wash hands and face standing at sink but performed oral care seated due to fatigue and back pain             Lower Body Dressing: Supervision/safety;Set up;Sitting/lateral leans;Cueing for compensatory techniques Lower Body Dressing Details (indicate cue type and reason): able to donn CAM boot with assistance for lower straps Toilet Transfer: Min guard;Rolling walker (  2 wheels);Ambulation;Regular  Toilet;Grab bars Toilet Transfer Details (indicate cue type and reason): ambulated to bathroom and used rail to assist with lowering to seat and to stand Toileting- Clothing Manipulation and Hygiene: Supervision/safety;Sitting/lateral lean Toileting - Clothing Manipulation Details (indicate cue type and reason): able to perform toilet hygiene seated       General ADL Comments: limited standing tolerance with self care    Extremity/Trunk Assessment              Vision       Perception     Praxis      Cognition Arousal/Alertness: Awake/alert Behavior During Therapy: WFL for tasks assessed/performed Overall Cognitive Status: No family/caregiver present to determine baseline cognitive functioning Area of Impairment: Attention, Memory, Safety/judgement, Awareness, Problem solving                       Following Commands: Follows one step commands with increased time Safety/Judgement: Decreased awareness of safety, Decreased awareness of deficits Awareness: Emergent Problem Solving: Slow processing, Requires verbal cues General Comments: improvement on recall of back precautions on this date, "I can't bend over because of my back precautions"        Exercises      Shoulder Instructions       General Comments      Pertinent Vitals/ Pain       Pain Assessment Pain Assessment: Faces Faces Pain Scale: Hurts even more Pain Location: back, L side Pain Descriptors / Indicators: Grimacing, Guarding Pain Intervention(s): Limited activity within patient's tolerance, Monitored during session, Repositioned  Home Living                                          Prior Functioning/Environment              Frequency  Min 2X/week        Progress Toward Goals  OT Goals(current goals can now be found in the care plan section)  Progress towards OT goals: Progressing toward goals  Acute Rehab OT Goals Patient Stated Goal: go home OT Goal  Formulation: With patient Time For Goal Achievement: 02/05/23 Potential to Achieve Goals: Good ADL Goals Pt Will Perform Grooming: sitting;with set-up Pt Will Perform Lower Body Dressing: with modified independence;sit to/from stand;sitting/lateral leans Pt Will Transfer to Toilet: with modified independence;ambulating Pt Will Perform Tub/Shower Transfer: Tub transfer;Stand pivot transfer;with min assist;tub bench Pt/caregiver will Perform Home Exercise Program: Increased strength;Both right and left upper extremity;With theraband;Independently;With written HEP provided Additional ADL Goal #1: Patient will maintain NWB on RLE and spinal precautions throughout ADLs. Additional ADL Goal #2: Pt to improve standing activity tolerance > 7 min during ADLs/mobility without rest break  Plan Discharge plan remains appropriate;Frequency remains appropriate    Co-evaluation                 AM-PAC OT "6 Clicks" Daily Activity     Outcome Measure   Help from another person eating meals?: None Help from another person taking care of personal grooming?: A Little Help from another person toileting, which includes using toliet, bedpan, or urinal?: A Little Help from another person bathing (including washing, rinsing, drying)?: A Lot Help from another person to put on and taking off regular upper body clothing?: A Little Help from another person to put on and taking off regular lower body clothing?: A Little 6 Click  Score: 18    End of Session Equipment Utilized During Treatment: Rolling walker (2 wheels);Gait belt  OT Visit Diagnosis: Unsteadiness on feet (R26.81);Muscle weakness (generalized) (M62.81);Pain;Other symptoms and signs involving cognitive function;Other abnormalities of gait and mobility (R26.89)   Activity Tolerance Patient tolerated treatment well   Patient Left in chair;with call bell/phone within reach   Nurse Communication Mobility status        Time: 1011-1046 OT  Time Calculation (min): 35 min  Charges: OT General Charges $OT Visit: 1 Visit OT Treatments $Self Care/Home Management : 23-37 mins  Lodema Hong, [place]  Office [place] 01/30/2023, 2:37 PM

## 2023-01-30 NOTE — Progress Notes (Signed)
PROGRESS NOTE    Hunter Reyes  CVE:[id number] DOB: 24-Sep-1976 DOA: 12/02/2022 PCP: Celene Squibb, MD    Brief Narrative:   Hunter Reyes is a 47 y.o. male with past medical history significant for bipolar disorder, polysubstance abuse, COPD, chronic hepatitis B, peripheral neuropathy, chronic pain syndrome, history of right foot osteomyelitis who presented to [facility] ED on 12/10 with confusion, low back pain and urinary retention.  Workup in the ED was notable for obstructive uropathy with acute renal failure and Foley catheter was placed.  Patient did require hemodialysis earlier in the hospital course which has now been discontinued.  Imaging also notable for L4 epidural abscess with subacute symptoms of cauda equina syndrome and patient underwent right L4 laminectomy and debridement on 12/05/2022.  Subsequent blood cultures with MRSA bacteremia.  Additionally patient was found to have an abscess and osteomyelitis of his right foot in which a transmetatarsal amputation was performed on 12/09/2022.  Infectious disease was consulted and patient continues on daptomycin with projected end date of 02/03/2023.  Assessment & Plan:   MRSA bacteremia L3/4 discitis with epidural abscess s/p laminectomy/debridement Patient presenting to ED with low back pain, confusion.  Imaging on admission was notable for large ventral epidural abscess with compression of the cauda equina nerve roots at the level L4.  Neurosurgery was consulted and patient underwent right L4 laminectomy with sublaminar decompression with debridement of the ventral epidural abscess by Dr. Kathyrn Sheriff on 12/05/2022.  Patient was started on empiric antibiotics and infectious disease was consulted and followed during hospital course.  Blood cultures from 12/13 positive for MRSA.  Repeat blood cultures on 12/06/2022 showed no growth. -- Continue daptomycin x 6 weeks per ID (End date: 02/03/2023) -- BMP twice weekly -- CK level weekly --  Repeat MR L-spine with and without contrast ordered for 2/9; will notify after results back for evaluation per their request -- ID plans following IV antibiotics to continue doxycycline 100 mg p.o. twice daily until outpatient follow-up  Right foot abscess/osteomyelitis s/p TMA MR right foot with fluid collection plantar aspect of the second and third metatarsal heads measuring 4.4 x 2.2 x 2.3 cm consistent with abscess and bone marrow edema concerning for osteomyelitis.  Orthopedics was consulted and patient underwent transmetatarsal amputation on 12/17.  On antibiotics as above.  Weightbearing as tolerates postoperative shoe.  Outpatient follow-up with orthopedics.  Acute renal failure on CKD stage IV Obstructive uropathy Patient presenting with no urine output over the previous 3 days prior to admission and was noted to have severe urinary retention likely related to his epidural abscess.  Foley catheter was placed but patient did require hemodialysis during initial hospitalization; which has now been discontinued.  Attempts at Foley catheter discontinuation have been unsuccessful, was replaced.  Continue tamsulosin, finasteride.  Outpatient follow-up with nephrology.   Hyperkalemia Related to renal failure, now resolved.  Bipolar disorder/mood disorder -- Zoloft 25 mg p.o. daily  Pressure injury right heel -- Continue offloading, Medihoney to heal with Xerofoam to the transmetatarsal site.  Anemia of chronic medical/renal disease Hemoglobin 7.3, stable.  Transfuse 1 unit PRBC on 12/17/2022. -- Transfuse for hemoglobin less than 7.0  Chronic pain syndrome History of polysubstance abuse/IVDU -- On Suboxone 1 tab p.o. twice daily -- Previously followed with pain clinic    DVT prophylaxis: SCD's Start: 12/09/22 1137    Code Status: Full Code Family Communication: No family present at bedside this morning  Disposition Plan:  Level of care: Med-Surg Status is:  Inpatient Remains  inpatient appropriate because: Remains inpatient for completion of IV antibiotics, end date projected 2/11, repeat MRI L-spine pending for 2/9.  Plan home health on discharge.    Consultants:  Neurosurgery, Dr. Kathyrn Sheriff Orthopedic surgery, Dr. Sharol Given Nephrology Interventional radiology Infectious disease  Procedures:  Right L4 laminectomy and debridement, Dr. Kathyrn Sheriff 12/05/2022 Right trans metatarsal imitation, orthopedics, Dr. Sharol Given 12/09/2022  Antimicrobials:  Ceftriaxone 12/11 x 1 dose Vancomycin 12/11 x 1 dose Cefepime 12/11>>12/12 Metronidazole 12/11>>12/12 Daptomycin 12/13 >> (02/03/23)    Subjective: Patient seen examined at bedside, resting comfortably.  No specific complaints this morning.  Discussed that he is nearing the end of his IV antibiotic therapy with plan to repeat MR L-spine on Friday.  Patient states he has been ambulating in the room but continues with generalized weakness.  Plan to return to his residence in [place] [facility] on discharge.  Projected end date of 5 antibiotics now 2/11.  No other specific complaints or concerns at this time.  Denies headache, no dizziness, no chest pain, no shortness of breath, no abdominal pain, no fever/chills/night sweats, no nausea/vomiting/diarrhea, no focal weakness, no fatigue, no paresthesias.  No acute events overnight per nursing staff.  Objective: Vitals:   01/29/23 1800 01/29/23 1946 01/30/23 0517 01/30/23 0734  BP: 109/63 (!) 99/54 (!) 99/59 114/73  Pulse: 75 79 67 84  Resp: 18 18 16 16   Temp: 98 F (36.7 C) 98 F (36.7 C) 97.9 F (36.6 C) 97.7 F (36.5 C)  TempSrc: Oral Oral Oral Oral  SpO2: 100% 99% 100% 97%  Weight:      Height:        Intake/Output Summary (Last 24 hours) at 01/30/2023 1054 Last data filed at 01/30/2023 0600 Gross per 24 hour  Intake 480 ml  Output 2925 ml  Net -2445 ml   Filed Weights   12/18/22 1522 12/18/22 1801 01/26/23 0500  Weight: (!) 144.3 kg 134.1 kg 135 kg     Examination:  Physical Exam: GEN: NAD, alert and oriented x 3, chronically ill in appearance, appears older than stated age HEENT: NCAT, PERRL, EOMI, sclera clear, poor dentition PULM: CTAB w/o wheezes/crackles, normal respiratory effort, on room air CV: RRR w/o M/G/R GI: abd soft, NTND, NABS, no R/G/M MSK: no peripheral edema, noted right TMA site with dressing in place, clean/dry/intact NEURO: CN II-XII intact, no focal deficits, sensation to light touch intact PSYCH: normal mood/affect Integumentary: dry/intact, no rashes or wounds    Data Reviewed: I have personally reviewed following labs and imaging studies  CBC: Recent Labs  Lab 01/25/23 0651 01/30/23 0342  WBC 9.2 10.4  HGB 7.7* 7.3*  HCT 23.4* 21.9*  MCV 87.6 88.3  PLT 266 174   Basic Metabolic Panel: Recent Labs  Lab 01/25/23 0651 01/28/23 0524 01/30/23 0342  NA 136 132* 136  K 3.6 4.0 3.8  CL 102 101 102  CO2 22 20* 21*  GLUCOSE 96 97 103*  BUN 58* 60* 51*  CREATININE 4.96* 4.24* 4.06*  CALCIUM 9.4 9.1 8.9  PHOS  --  5.2*  --    GFR: Estimated Creatinine Clearance: 33.6 mL/min (A) (by C-G formula based on SCr of 4.06 mg/dL (H)). Liver Function Tests: Recent Labs  Lab 01/28/23 0523 01/28/23 0524  AST 14*  --   ALT 14  --   ALKPHOS 55  --   BILITOT 0.3  --   PROT 7.3  --   ALBUMIN 3.1* 3.1*   No results for input(s): "LIPASE", "AMYLASE"  in the last 168 hours. No results for input(s): "AMMONIA" in the last 168 hours. Coagulation Profile: No results for input(s): "INR", "PROTIME" in the last 168 hours. Cardiac Enzymes: Recent Labs  Lab 01/25/23 0651 01/28/23 0523  CKTOTAL 34* 39*   BNP (last 3 results) No results for input(s): "PROBNP" in the last 8760 hours. HbA1C: No results for input(s): "HGBA1C" in the last 72 hours. CBG: No results for input(s): "GLUCAP" in the last 168 hours. Lipid Profile: No results for input(s): "CHOL", "HDL", "LDLCALC", "TRIG", "CHOLHDL", "LDLDIRECT"  in the last 72 hours. Thyroid Function Tests: No results for input(s): "TSH", "T4TOTAL", "FREET4", "T3FREE", "THYROIDAB" in the last 72 hours. Anemia Panel: No results for input(s): "VITAMINB12", "FOLATE", "FERRITIN", "TIBC", "IRON", "RETICCTPCT" in the last 72 hours. Sepsis Labs: No results for input(s): "PROCALCITON", "LATICACIDVEN" in the last 168 hours.  No results found for this or any previous visit (from the past 240 hour(s)).       Radiology Studies: No results found.      Scheduled Meds:  buprenorphine-naloxone  1 tablet Sublingual BID   Chlorhexidine Gluconate Cloth  6 each Topical Daily   vitamin B-12  1,000 mcg Oral Daily   ferrous sulfate  325 mg Oral Q breakfast   finasteride  5 mg Oral Daily   folic acid  1 mg Oral Daily   leptospermum manuka honey  1 Application Topical Daily   magnesium oxide  400 mg Oral BID   nutrition supplement (JUVEN)  1 packet Oral BID BM   pantoprazole  40 mg Oral Daily   polyethylene glycol  17 g Oral Daily   senna-docusate  1 tablet Oral BID   sertraline  25 mg Oral Daily   tamsulosin  0.8 mg Oral QPC supper   thiamine  100 mg Oral Daily   Continuous Infusions:  DAPTOmycin (CUBICIN) 850 mg in sodium chloride 0.9 % IVPB 850 mg (01/29/23 2104)     LOS: 58 days    Time spent: 52 minutes spent on chart review, discussion with nursing staff, consultants, updating family and interview/physical exam; more than 50% of that time was spent in counseling and/or coordination of care.    Hunter Sellin J [place] DO [facility] Available via Epic secure chat 7am-7pm After these hours, please refer to coverage provider listed on [email or web address] 01/30/2023, 10:54 AM

## 2023-01-31 DIAGNOSIS — R7881 Bacteremia: Secondary | ICD-10-CM | POA: Diagnosis not present

## 2023-01-31 DIAGNOSIS — B9562 Methicillin resistant Staphylococcus aureus infection as the cause of diseases classified elsewhere: Secondary | ICD-10-CM | POA: Diagnosis not present

## 2023-01-31 LAB — RENAL FUNCTION PANEL
Albumin: 3 g/dL — ABNORMAL LOW (ref 3.5–5.0)
Anion gap: 12 (ref 5–15)
BUN: 45 mg/dL — ABNORMAL HIGH (ref 6–20)
CO2: 23 mmol/L (ref 22–32)
Calcium: 9.1 mg/dL (ref 8.9–10.3)
Chloride: 102 mmol/L (ref 98–111)
Creatinine, Ser: 3.91 mg/dL — ABNORMAL HIGH (ref 0.61–1.24)
GFR, Estimated: 18 mL/min — ABNORMAL LOW (ref >60)
Glucose, Bld: 94 mg/dL (ref 70–99)
Phosphorus: 6 mg/dL — ABNORMAL HIGH (ref 2.5–4.6)
Potassium: 4.3 mmol/L (ref 3.5–5.1)
Sodium: 137 mmol/L (ref 135–145)

## 2023-01-31 MED ORDER — DIPHENHYDRAMINE-ZINC ACETATE 2-0.1 % EX CREA
TOPICAL_CREAM | Freq: Three times a day (TID) | CUTANEOUS | Status: DC | PRN
  Filled 2023-01-31 (×2): qty 28

## 2023-01-31 NOTE — Progress Notes (Addendum)
PROGRESS NOTE    ALA CAPRI  SJG:[id number] DOB: 1976-12-17 DOA: 12/02/2022 PCP: Celene Squibb, MD    Brief Narrative:   Hunter Reyes is a 47 y.o. male with past medical history significant for bipolar disorder, polysubstance abuse, COPD, chronic hepatitis B, peripheral neuropathy, chronic pain syndrome, history of right foot osteomyelitis who presented to [facility] ED on 12/10 with confusion, low back pain and urinary retention.  Workup in the ED was notable for obstructive uropathy with acute renal failure and Foley catheter was placed.  Patient did require hemodialysis earlier in the hospital course which has now been discontinued.  Imaging also notable for L4 epidural abscess with subacute symptoms of cauda equina syndrome and patient underwent right L4 laminectomy and debridement on 12/05/2022.  Subsequent blood cultures with MRSA bacteremia.  Additionally patient was found to have an abscess and osteomyelitis of his right foot in which a transmetatarsal amputation was performed on 12/09/2022.  Infectious disease was consulted and patient continues on daptomycin with projected end date of 02/03/2023.  Assessment & Plan:   MRSA bacteremia L3/4 discitis with epidural abscess s/p laminectomy/debridement Patient presenting to ED with low back pain, confusion.  Imaging on admission was notable for large ventral epidural abscess with compression of the cauda equina nerve roots at the level L4.  Neurosurgery was consulted and patient underwent right L4 laminectomy with sublaminar decompression with debridement of the ventral epidural abscess by Dr. Kathyrn Sheriff on 12/05/2022.  Patient was started on empiric antibiotics and infectious disease was consulted and followed during hospital course.  Blood cultures from 12/13 positive for MRSA.  Repeat blood cultures on 12/06/2022 showed no growth.  Repeat MR L-spine with and without contrast 2/7 with persistent osteomyelitis/discitis L3-4, associated  paraspinous inflammatory changes improved with near interval resolution of psoas collection, mild residual epidermal enhancement/phlegmon without frank epidural abscess, no other new infection elsewhere within the lumbar spine. -- Continue daptomycin x 6 weeks per ID (End date: 02/03/2023) -- BMP twice weekly -- CK level weekly -- ID plans doxycycline 100 mg p.o. twice daily following completion of IV antibiotics until outpatient follow-up  Discussed with ID, Dr. Tommy Medal on 2/8 regarding follow-up MRI; agree with plan to continue IV daptomycin until 2/11 followed by doxycycline on discharge until follow-up with ID.  Right foot abscess/osteomyelitis s/p TMA MR right foot with fluid collection plantar aspect of the second and third metatarsal heads measuring 4.4 x 2.2 x 2.3 cm consistent with abscess and bone marrow edema concerning for osteomyelitis.  Orthopedics was consulted and patient underwent transmetatarsal amputation on 12/17.  On antibiotics as above.  Weightbearing as tolerates postoperative shoe.  Outpatient follow-up with orthopedics.  Acute renal failure on CKD stage IV Obstructive uropathy Patient presenting with no urine output over the previous 3 days prior to admission and was noted to have severe urinary retention likely related to his epidural abscess.  Foley catheter was placed but patient did require hemodialysis during initial hospitalization; which has now been discontinued.  Attempts at Foley catheter discontinuation have been unsuccessful, was replaced.  Continue tamsulosin, finasteride.  Outpatient follow-up with nephrology.   Hyperkalemia Related to renal failure, now resolved.  Bipolar disorder/mood disorder -- Zoloft 25 mg p.o. daily  Pressure injury right heel -- Continue offloading, Medihoney to heal with Xerofoam to the transmetatarsal site.  Anemia of chronic medical/renal disease Hemoglobin 7.3, stable.  Transfuse 1 unit PRBC on 12/17/2022. -- Transfuse for  hemoglobin less than 7.0  Chronic pain syndrome History  of polysubstance abuse/IVDU -- On Suboxone 1 tab p.o. twice daily -- Previously followed with pain clinic    DVT prophylaxis: SCD's Start: 12/09/22 1137    Code Status: Full Code Family Communication: No family present at bedside this morning  Disposition Plan:  Level of care: Med-Surg Status is: Inpatient Remains inpatient appropriate because: Remains inpatient for completion of IV antibiotics, end date projected 2/11.  Plan home health on discharge.    Consultants:  Neurosurgery, Dr. Kathyrn Sheriff Orthopedic surgery, Dr. Sharol Given Nephrology Interventional radiology Infectious disease  Procedures:  Right L4 laminectomy and debridement, Dr. Kathyrn Sheriff 12/05/2022 Right trans metatarsal imitation, orthopedics, Dr. Sharol Given 12/09/2022  Antimicrobials:  Ceftriaxone 12/11 x 1 dose Vancomycin 12/11 x 1 dose Cefepime 12/11>>12/12 Metronidazole 12/11>>12/12 Daptomycin 12/13 >> (02/03/23)    Subjective: Patient seen examined at bedside, resting comfortably.  No specific complaints this morning.  MRI was performed last night even though it was ordered for 2/9.  Discussed findings with patient.  No other specific complaints or concerns at this time.  Denies headache, no dizziness, no chest pain, no shortness of breath, no abdominal pain, no fever/chills/night sweats, no nausea/vomiting/diarrhea, no focal weakness, no fatigue, no paresthesias.  No acute events overnight per nursing staff.  Objective: Vitals:   01/30/23 0734 01/30/23 2140 01/31/23 0400 01/31/23 0824  BP: 114/73 116/66 (!) 101/54 106/61  Pulse: 84 84 78 78  Resp: 16 18 16    Temp: 97.7 F (36.5 C) 97.9 F (36.6 C) 97.7 F (36.5 C) 97.6 F (36.4 C)  TempSrc: Oral Oral Oral Oral  SpO2: 97% 100% 100% 100%  Weight:      Height:        Intake/Output Summary (Last 24 hours) at 01/31/2023 1208 Last data filed at 01/31/2023 0400 Gross per 24 hour  Intake 480 ml  Output  1900 ml  Net -1420 ml   Filed Weights   12/18/22 1522 12/18/22 1801 01/26/23 0500  Weight: (!) 144.3 kg 134.1 kg 135 kg    Examination:  Physical Exam: GEN: NAD, alert and oriented x 3, chronically ill in appearance, appears older than stated age HEENT: NCAT, PERRL, EOMI, sclera clear, poor dentition PULM: CTAB w/o wheezes/crackles, normal respiratory effort, on room air CV: RRR w/o M/G/R GI: abd soft, NTND, NABS, no R/G/M MSK: no peripheral edema, noted right TMA site with dressing in place, clean/dry/intact NEURO: CN II-XII intact, no focal deficits, sensation to light touch intact PSYCH: normal mood/affect Integumentary: dry/intact, no rashes or wounds    Data Reviewed: I have personally reviewed following labs and imaging studies  CBC: Recent Labs  Lab 01/25/23 0651 01/30/23 0342  WBC 9.2 10.4  HGB 7.7* 7.3*  HCT 23.4* 21.9*  MCV 87.6 88.3  PLT 266 188   Basic Metabolic Panel: Recent Labs  Lab 01/25/23 0651 01/28/23 0524 01/30/23 0342 01/31/23 0616  NA 136 132* 136 137  K 3.6 4.0 3.8 4.3  CL 102 101 102 102  CO2 22 20* 21* 23  GLUCOSE 96 97 103* 94  BUN 58* 60* 51* 45*  CREATININE 4.96* 4.24* 4.06* 3.91*  CALCIUM 9.4 9.1 8.9 9.1  PHOS  --  5.2*  --  6.0*   GFR: Estimated Creatinine Clearance: 34.9 mL/min (A) (by C-G formula based on SCr of 3.91 mg/dL (H)). Liver Function Tests: Recent Labs  Lab 01/28/23 0523 01/28/23 0524 01/31/23 0616  AST 14*  --   --   ALT 14  --   --   ALKPHOS 55  --   --  BILITOT 0.3  --   --   PROT 7.3  --   --   ALBUMIN 3.1* 3.1* 3.0*   No results for input(s): "LIPASE", "AMYLASE" in the last 168 hours. No results for input(s): "AMMONIA" in the last 168 hours. Coagulation Profile: No results for input(s): "INR", "PROTIME" in the last 168 hours. Cardiac Enzymes: Recent Labs  Lab 01/25/23 0651 01/28/23 0523  CKTOTAL 34* 39*   BNP (last 3 results) No results for input(s): "PROBNP" in the last 8760  hours. HbA1C: No results for input(s): "HGBA1C" in the last 72 hours. CBG: No results for input(s): "GLUCAP" in the last 168 hours. Lipid Profile: No results for input(s): "CHOL", "HDL", "LDLCALC", "TRIG", "CHOLHDL", "LDLDIRECT" in the last 72 hours. Thyroid Function Tests: No results for input(s): "TSH", "T4TOTAL", "FREET4", "T3FREE", "THYROIDAB" in the last 72 hours. Anemia Panel: No results for input(s): "VITAMINB12", "FOLATE", "FERRITIN", "TIBC", "IRON", "RETICCTPCT" in the last 72 hours. Sepsis Labs: No results for input(s): "PROCALCITON", "LATICACIDVEN" in the last 168 hours.  No results found for this or any previous visit (from the past 240 hour(s)).       Radiology Studies: MR Lumbar Spine W Wo Contrast  Result Date: 01/30/2023 CLINICAL DATA:  Initial evaluation for lumbar radiculopathy. EXAM: MRI LUMBAR SPINE WITHOUT AND WITH CONTRAST TECHNIQUE: Multiplanar and multiecho pulse sequences of the lumbar spine were obtained without and with intravenous contrast. CONTRAST:  50mL GADAVIST GADOBUTROL 1 MMOL/ML IV SOLN COMPARISON:  Prior study from 12/31/2022. FINDINGS: Segmentation:  Standard. Alignment: Stable alignment with underlying dextroscoliosis. Trace degenerative retrolisthesis of L1 on L2 and L2 on L3. Vertebrae: Persistent findings of osteomyelitis discitis at L3-4. There has been progressive disc space height loss in the interim. Persistent fairly robust marrow edema and enhancement is seen. The surrounding paraspinous inflammatory changes again seen, improved from prior. Bilateral psoas collections are improved, with only a tiny residual 4 mm collection now seen at the anterior margin of the right psoas muscle (series 6, image 30). Associated epidural involvement is also improved. Mild residual epidural enhancement/phlegmon seen extending from L2-3 through L5-S1. No frank epidural abscess now visualized. Mild edema and enhancement also seen about the right L3-4 facet, also  mildly improved. No other evidence for new infection elsewhere within the lumbar spine. Vertebral body height otherwise maintained. No interval fracture. Underlying bone marrow signal intensity within normal limits. No worrisome osseous lesions. Conus medullaris and cauda equina: Conus extends to the T12-L1 level. Conus and cauda equina appear normal. Paraspinal and other soft tissues: Improving paraspinous inflammatory changes as above. Postoperative changes from prior posterior decompression at L3-4. Small T2 hyperintense cyst noted at the left kidney, benign in appearance, no follow-up imaging recommended. Previously seen inflammatory changes about the right kidney are also improved and/or resolved. Disc levels: Underlying multilevel degenerative spondylosis and facet arthrosis extending from L1-2 through L5-S1, not significantly changed from recent exam. IMPRESSION: 1. Persistent findings of osteomyelitis discitis at L3-4. Associated paraspinous inflammatory changes have improved, with near interval resolution of previously seen psoas collections. Mild residual epidural enhancement/phlegmon without frank epidural abscess. 2. No other new infection elsewhere within the lumbar spine. 3. Underlying multilevel degenerative spondylosis and facet arthrosis, relatively stable from prior. Electronically Signed   By: Jeannine Boga M.D.   On: 01/30/2023 21:29        Scheduled Meds:  buprenorphine-naloxone  1 tablet Sublingual BID   Chlorhexidine Gluconate Cloth  6 each Topical Daily   vitamin B-12  1,000 mcg Oral Daily  ferrous sulfate  325 mg Oral Q breakfast   finasteride  5 mg Oral Daily   folic acid  1 mg Oral Daily   leptospermum manuka honey  1 Application Topical Daily   magnesium oxide  400 mg Oral BID   nutrition supplement (JUVEN)  1 packet Oral BID BM   pantoprazole  40 mg Oral Daily   polyethylene glycol  17 g Oral Daily   senna-docusate  1 tablet Oral BID   sertraline  25 mg Oral  Daily   tamsulosin  0.8 mg Oral QPC supper   thiamine  100 mg Oral Daily   Continuous Infusions:  DAPTOmycin (CUBICIN) 850 mg in sodium chloride 0.9 % IVPB 850 mg (01/29/23 2104)     LOS: 59 days    Time spent: 52 minutes spent on chart review, discussion with nursing staff, consultants, updating family and interview/physical exam; more than 50% of that time was spent in counseling and/or coordination of care.    Torre Pikus J [place] DO [facility] Available via Epic secure chat 7am-7pm After these hours, please refer to coverage provider listed on [email or web address] 01/31/2023, 12:08 PM

## 2023-01-31 NOTE — Progress Notes (Signed)
Physical Therapy Treatment Patient Details Name: Hunter Reyes MRN: [id number] DOB: [birth year] Today's Date: 01/31/2023   History of Present Illness 47 y.o. male admitted 12/10 with AMS, urinary retention and low back pain. Underwent L4 lumbar laminectomy for epidural abscess 12/13. s/p R foot transmet amp 12/17. Pt initiated HD on 12/26. PMH:  bipolar 1 disorder, borderline personality disorder, COPD, crack cocaine abuse, depression, peripheral neuropathy, history of right foot osteomyelitis, panic attack and history of seizures    PT Comments    Good progress towards functional goals. Educated on safety while ambulation using walker ,closer to proximity for support x100 feet today wearing cam boot. Min assist to rise from toilet with wall rail. Reviewed LE exercises and encouraged daily use between therapy sessions. Patient will continue to benefit from skilled physical therapy services to further improve independence with functional mobility.    Recommendations for follow up therapy are one component of a multi-disciplinary discharge planning process, led by the attending physician.  Recommendations may be updated based on patient status, additional functional criteria and insurance authorization.  Follow Up Recommendations  Home health PT (Pt declining SNF dur to financial concerns; Likely insurance cannot support HHPT follow up - in that case, Outpt PT) Can patient physically be transported by private vehicle: Yes   Assistance Recommended at Discharge Frequent or constant Supervision/Assistance  Patient can return home with the following Assistance with cooking/housework;Assist for transportation;Help with stairs or ramp for entrance;A lot of help with walking and/or transfers;A little help with bathing/dressing/bathroom   Equipment Recommendations  Rolling walker (2 wheels);BSC/3in1;Wheelchair (measurements PT)    Recommendations for Other Services       Precautions /  Restrictions Precautions Precautions: Fall;Back Precaution Booklet Issued: No Precaution Comments: Able to state precautions; noted some difficulty putting precautions into practice with functional mobility Required Braces or Orthoses: Other Brace Other Brace: post op shoe- CAM boot R foot Restrictions Weight Bearing Restrictions: No RLE Weight Bearing: Weight bearing as tolerated Other Position/Activity Restrictions: with post op shoe- updated weight bearing per Dr. Sharol Given on 01/15/23     Mobility  Bed Mobility Overal bed mobility: Modified Independent             General bed mobility comments: No physical assist to rise to EOB. Required extra time.    Transfers Overall transfer level: Needs assistance Equipment used: Rolling walker (2 wheels) Transfers: Sit to/from Stand Sit to Stand: Min assist           General transfer comment: Min assist to stabilize RW as pt rose from bed x2. Cues for hand placement, good push up. Min assist for boost to stand from low toilet,using wall rail on Rt, Lt hand to stabilize on RW.    Ambulation/Gait Ambulation/Gait assistance: Min guard Gait Distance (Feet): 100 Feet Assistive device: Rolling walker (2 wheels) Gait Pattern/deviations: Step-through pattern, Decreased stride length, Trunk flexed Gait velocity: decr Gait velocity interpretation: <1.31 ft/sec, indicative of household ambulator   General Gait Details: Close guard for safety. Minor instability of Rt knee noted however no overt buckling during bout. Cues to keep walker closer to proximity due to reported buckling previously.   Stairs             Wheelchair Mobility    Modified Rankin (Stroke Patients Only)       Balance Overall balance assessment: Needs assistance Sitting-balance support: No upper extremity supported, Feet supported Sitting balance-[name] Scale: Good     Standing balance support: During functional activity, Bilateral  upper extremity  supported Standing balance-[name] Scale: Fair Standing balance comment: Able to stand short periods without UE suppor.t                            Cognition Arousal/Alertness: Awake/alert Behavior During Therapy: WFL for tasks assessed/performed Overall Cognitive Status: No family/caregiver present to determine baseline cognitive functioning Area of Impairment: Problem solving, Safety/judgement                       Following Commands: Follows one step commands consistently Safety/Judgement: Decreased awareness of safety, Decreased awareness of deficits              Exercises General Exercises - Lower Extremity Ankle Circles/Pumps: AROM, Both, 10 reps, Seated Quad Sets: AROM, Both, 10 reps, Supine Gluteal Sets: Strengthening, Both, 10 reps, Supine Long Arc Quad: Both, Seated, 20 reps, Strengthening Straight Leg Raises: Both, 10 reps, Strengthening, Supine Hip Flexion/Marching: Both, Seated, Strengthening, 10 reps    General Comments        Pertinent Vitals/Pain Pain Assessment Pain Assessment: Faces Faces Pain Scale: Hurts little more Pain Location: back, L side Pain Descriptors / Indicators: Grimacing, Guarding Pain Intervention(s): Monitored during session    Home Living                          Prior Function            PT Goals (current goals can now be found in the care plan section) Acute Rehab PT Goals Patient Stated Goal: home PT Goal Formulation: With patient Time For Goal Achievement: 02/04/23 Potential to Achieve Goals: Fair Progress towards PT goals: Progressing toward goals    Frequency    Min 3X/week      PT Plan Current plan remains appropriate    Co-evaluation              AM-PAC PT "6 Clicks" Mobility   Outcome Measure  Help needed turning from your back to your side while in a flat bed without using bedrails?: None Help needed moving from lying on your back to sitting on the side of a flat bed  without using bedrails?: None Help needed moving to and from a bed to a chair (including a wheelchair)?: A Little Help needed standing up from a chair using your arms (e.g., wheelchair or bedside chair)?: A Little Help needed to walk in hospital room?: A Little Help needed climbing 3-5 steps with a railing? : A Lot 6 Click Score: 19    End of Session Equipment Utilized During Treatment: Gait belt Activity Tolerance: Patient tolerated treatment well Patient left: in chair;with call bell/phone within reach;with bed alarm set   PT Visit Diagnosis: Muscle weakness (generalized) (M62.81);Pain;Other abnormalities of gait and mobility (R26.89) Pain - Right/Left: Right Pain - part of body:  (back)     Time: 0109-3235 PT Time Calculation (min) (ACUTE ONLY): 37 min  Charges:  $Gait Training: 8-22 mins $Therapeutic Exercise: 8-22 mins                     Candie Mile, PT, DPT Physical Therapist [facility] [place] 01/31/2023, 3:28 PM

## 2023-02-01 ENCOUNTER — Other Ambulatory Visit (HOSPITAL_COMMUNITY): Payer: Self-pay

## 2023-02-01 LAB — CBC
HCT: 22.5 % — ABNORMAL LOW (ref 39.0–52.0)
Hemoglobin: 7.2 g/dL — ABNORMAL LOW (ref 13.0–17.0)
MCH: 28.6 pg (ref 26.0–34.0)
MCHC: 32 g/dL (ref 30.0–36.0)
MCV: 89.3 fL (ref 80.0–100.0)
Platelets: 240 10*3/uL (ref 150–400)
RBC: 2.52 MIL/uL — ABNORMAL LOW (ref 4.22–5.81)
RDW: 14 % (ref 11.5–15.5)
WBC: 9.7 10*3/uL (ref 4.0–10.5)
nRBC: 0 % (ref 0.0–0.2)

## 2023-02-01 LAB — BASIC METABOLIC PANEL
Anion gap: 11 (ref 5–15)
BUN: 45 mg/dL — ABNORMAL HIGH (ref 6–20)
CO2: 24 mmol/L (ref 22–32)
Calcium: 9.1 mg/dL (ref 8.9–10.3)
Chloride: 104 mmol/L (ref 98–111)
Creatinine, Ser: 3.82 mg/dL — ABNORMAL HIGH (ref 0.61–1.24)
GFR, Estimated: 19 mL/min — ABNORMAL LOW (ref >60)
Glucose, Bld: 93 mg/dL (ref 70–99)
Potassium: 4.5 mmol/L (ref 3.5–5.1)
Sodium: 139 mmol/L (ref 135–145)

## 2023-02-01 LAB — CK: Total CK: 37 U/L — ABNORMAL LOW (ref 49–397)

## 2023-02-01 MED ORDER — SERTRALINE HCL 50 MG PO TABS
25.0000 mg | ORAL_TABLET | Freq: Every day | ORAL | 0 refills | Status: DC
  Filled 2023-02-01: qty 15, 30d supply, fill #0

## 2023-02-01 MED ORDER — DOXYCYCLINE HYCLATE 100 MG PO TABS
100.0000 mg | ORAL_TABLET | Freq: Two times a day (BID) | ORAL | 1 refills | Status: DC
  Filled 2023-02-01 – 2023-07-22 (×4): qty 60, 30d supply, fill #0

## 2023-02-01 MED ORDER — TAMSULOSIN HCL 0.4 MG PO CAPS
0.8000 mg | ORAL_CAPSULE | Freq: Every day | ORAL | 0 refills | Status: AC
  Filled 2023-02-01: qty 60, 30d supply, fill #0

## 2023-02-01 MED ORDER — POLYETHYLENE GLYCOL 3350 17 GM/SCOOP PO POWD
17.0000 g | Freq: Every day | ORAL | 0 refills | Status: DC
  Filled 2023-02-01: qty 238, 14d supply, fill #0

## 2023-02-01 MED ORDER — FINASTERIDE 5 MG PO TABS
5.0000 mg | ORAL_TABLET | Freq: Every day | ORAL | 0 refills | Status: AC
  Filled 2023-02-01: qty 30, 30d supply, fill #0

## 2023-02-01 MED ORDER — FOLIC ACID 1 MG PO TABS
1.0000 mg | ORAL_TABLET | Freq: Every day | ORAL | 0 refills | Status: AC
  Filled 2023-02-01: qty 30, 30d supply, fill #0

## 2023-02-01 MED ORDER — TIZANIDINE HCL 4 MG PO TABS
4.0000 mg | ORAL_TABLET | Freq: Four times a day (QID) | ORAL | 0 refills | Status: DC | PRN
  Filled 2023-02-01: qty 30, 8d supply, fill #0

## 2023-02-01 MED ORDER — TRAZODONE HCL 50 MG PO TABS
25.0000 mg | ORAL_TABLET | Freq: Every evening | ORAL | 0 refills | Status: DC | PRN
  Filled 2023-02-01: qty 7, 14d supply, fill #0

## 2023-02-01 MED ORDER — SENNOSIDES-DOCUSATE SODIUM 8.6-50 MG PO TABS: 1.0000 | ORAL_TABLET | Freq: Two times a day (BID) | ORAL | Status: DC

## 2023-02-01 MED ORDER — FERROUS SULFATE 325 (65 FE) MG PO TABS
325.0000 mg | ORAL_TABLET | Freq: Every day | ORAL | 0 refills | Status: DC
  Filled 2023-02-01: qty 30, 30d supply, fill #0

## 2023-02-01 MED ORDER — CYANOCOBALAMIN 1000 MCG PO TABS
1000.0000 ug | ORAL_TABLET | Freq: Every day | ORAL | 0 refills | Status: AC
  Filled 2023-02-01: qty 30, 30d supply, fill #0

## 2023-02-01 NOTE — Progress Notes (Signed)
Occupational Therapy Treatment Patient Details Name: Hunter Reyes MRN: [id number] DOB: [birth year] Today's Date: 02/01/2023   History of present illness 47 y.o. male admitted 12/10 with AMS, urinary retention and low back pain. Underwent L4 lumbar laminectomy for epidural abscess 12/13. s/p R foot transmet amp 12/17. Pt initiated HD on 12/26. PMH:  bipolar 1 disorder, borderline personality disorder, COPD, crack cocaine abuse, depression, peripheral neuropathy, history of right foot osteomyelitis, panic attack and history of seizures   OT comments  Pt progressing towards acute OT goals. Focus of session was OOB ADLs, toilet transfers, and functional mobility. Extra time needed, tolerated session fairly well. Up in recliner at end of session. D/c recommendation updated as below.    Recommendations for follow up therapy are one component of a multi-disciplinary discharge planning process, led by the attending physician.  Recommendations may be updated based on patient status, additional functional criteria and insurance authorization.    Follow Up Recommendations  Home health OT     Assistance Recommended at Discharge Intermittent Supervision/Assistance  Patient can return home with the following  A little help with walking and/or transfers;A little help with bathing/dressing/bathroom;Assistance with Education officer, environmental cushion (measurements OT);Wheelchair (measurements OT);Other (comment) 3N1   Recommendations for Other Services      Precautions / Restrictions Precautions Precautions: Fall;Back Precaution Booklet Issued: No Precaution Comments: Able to state precautions; noted some difficulty putting precautions into practice with functional mobility Required Braces or Orthoses: Other Brace Other Brace: post op shoe- CAM boot R foot Restrictions Weight Bearing Restrictions: No RLE Weight Bearing: Weight bearing as tolerated Other  Position/Activity Restrictions: with post op shoe- updated weight bearing per Dr. Sharol Given on 01/15/23       Mobility Bed Mobility Overal bed mobility: Modified Independent                  Transfers Overall transfer level: Needs assistance Equipment used: Rolling walker (2 wheels) Transfers: Sit to/from Stand Sit to Stand: Min guard, Min assist           General transfer comment: to/from EOB and recliner. light steadying assist     Balance Overall balance assessment: Needs assistance Sitting-balance support: No upper extremity supported, Feet supported Sitting balance-[name] Scale: Good     Standing balance support: During functional activity, Bilateral upper extremity supported Standing balance-[name] Scale: Fair Standing balance comment: Able to stand short periods without UE suppor.t                           ADL either performed or assessed with clinical judgement   ADL Overall ADL's : Needs assistance/impaired                         Toilet Transfer: Min guard;Ambulation;Rolling walker (2 wheels)   Toileting- Clothing Manipulation and Hygiene: Supervision/safety;Sitting/lateral lean       Functional mobility during ADLs: Min guard;Cueing for safety;Rolling walker (2 wheels)      Extremity/Trunk Assessment Upper Extremity Assessment Upper Extremity Assessment: Generalized weakness   Lower Extremity Assessment Lower Extremity Assessment: Defer to PT evaluation        Vision       Perception     Praxis      Cognition Arousal/Alertness: Awake/alert Behavior During Therapy: WFL for tasks assessed/performed Overall Cognitive Status: No family/caregiver present to determine baseline cognitive functioning Area of Impairment: Problem solving, Safety/judgement  Current Attention Level: Selective Memory: Decreased recall of precautions, Decreased short-term memory Following Commands: Follows one step  commands consistently Safety/Judgement: Decreased awareness of safety, Decreased awareness of deficits Awareness: Emergent Problem Solving: Slow processing, Requires verbal cues          Exercises      Shoulder Instructions       General Comments      Pertinent Vitals/ Pain       Pain Assessment Pain Assessment: Faces Faces Pain Scale: Hurts little more Pain Location: back with mobility Pain Descriptors / Indicators: Grimacing, Guarding Pain Intervention(s): Monitored during session, Limited activity within patient's tolerance, Repositioned  Home Living                                          Prior Functioning/Environment              Frequency  Min 2X/week        Progress Toward Goals  OT Goals(current goals can now be found in the care plan section)  Progress towards OT goals: Progressing toward goals  Acute Rehab OT Goals Patient Stated Goal: home OT Goal Formulation: With patient Time For Goal Achievement: 02/15/23 Potential to Achieve Goals: Good ADL Goals Pt Will Perform Grooming: sitting;with set-up Pt Will Perform Lower Body Dressing: with modified independence;sit to/from stand Pt Will Transfer to Toilet: with modified independence;ambulating Pt Will Perform Tub/Shower Transfer: Tub transfer;Stand pivot transfer;with min assist;tub bench Pt/caregiver will Perform Home Exercise Program: Increased strength;Both right and left upper extremity;With theraband;Independently;With written HEP provided Additional ADL Goal #1: Patient will maintain NWB on RLE and spinal precautions throughout ADLs. Additional ADL Goal #2: Pt to improve standing activity tolerance > 7 min during ADLs/mobility without rest break  Plan Frequency remains appropriate;Discharge plan needs to be updated    Co-evaluation                 AM-PAC OT "6 Clicks" Daily Activity     Outcome Measure   Help from another person eating meals?: None Help from  another person taking care of personal grooming?: A Little Help from another person toileting, which includes using toliet, bedpan, or urinal?: A Little Help from another person bathing (including washing, rinsing, drying)?: A Lot Help from another person to put on and taking off regular upper body clothing?: A Little Help from another person to put on and taking off regular lower body clothing?: A Little 6 Click Score: 18    End of Session Equipment Utilized During Treatment: Rolling walker (2 wheels);Gait belt  OT Visit Diagnosis: Unsteadiness on feet (R26.81);Muscle weakness (generalized) (M62.81);Pain;Other symptoms and signs involving cognitive function;Other abnormalities of gait and mobility (R26.89)   Activity Tolerance Patient tolerated treatment well   Patient Left in chair;with call bell/phone within reach   Nurse Communication          Time: 1310-1345 OT Time Calculation (min): 35 min  Charges: OT General Charges $OT Visit: 1 Visit OT Treatments $Self Care/Home Management : 23-37 mins  Tyrone Schimke, OT Acute Rehabilitation Services Office: [phone]   Hortencia Pilar 02/01/2023, 2:06 PM

## 2023-02-01 NOTE — TOC Transition Note (Signed)
Transitions of care Meds (10) locked up at Inpatient Pharmacy

## 2023-02-01 NOTE — TOC Progression Note (Signed)
Transition of Care [facility]) - Progression Note    Patient Details  Name: Hunter Reyes MRN: [id number] Date of Birth: Aug 23, 1976  Transition [facility]) CM/SW Contact  Erenest Rasher, RN Phone Number: [phone] 02/01/2023, 1:15 PM  Clinical Narrative:     TOC CM spoke to pt and he use to go to a Pain Management in [place]. He currently lives in [place] and wants to go to treatment center in [place]. [facility], requested demographics sheet and referral. States pt will need to bring vaild ID and insurance card. Faxed referral # [phone], phone # [phone].  Pt states he uses Medicaid transportation. His phone is broke but plans to get another phone. They can use sisters phone Vaughan Basta or Mardene Celeste.  Expected Discharge Plan: [place] Barriers to Discharge: Continued Medical Work up  Expected Discharge Plan and Services     Post Acute Care Choice:  (Outpatient PT) Living arrangements for the past 2 months: [place] Determinants of Health (SDOH) Interventions [place]: Food Insecurity Present (12/04/2022)  Housing: High Risk (12/04/2022)  Transportation Needs: Unmet Transportation Needs (12/04/2022)  Utilities: At Risk (12/04/2022)  Tobacco Use: High Risk (12/18/2022)    Readmission Risk Interventions     No data to display

## 2023-02-01 NOTE — Progress Notes (Signed)
PROGRESS NOTE    Hunter Reyes  [id number] DOB: 1976/12/07 DOA: 12/02/2022 PCP: Celene Squibb, MD    Brief Narrative:  Here to complete antibiotic therapy until 2/11.   Assessment & Plan:   MRSA bacteremia, discitis and epidural abscess Right foot abscess osteomyelitis status post TMA Acute renal failure on CKD stage IV with obstructive uropathy, now on Foley catheter Bipolar disorder Anemia of chronic disease Chronic pain syndrome on Suboxone followed previously on pain clinic.  Medically stable.  He will complete antibiotic therapy on 2/11 and will be discharged home on doxycycline. In order to facilitate weekend discharge, will prescribe all medications needed to be discharged on 2/11 evening after IV antibiotics completion. Patient currently does not have follow-up at Suboxone clinic, will send referral to follow-up.  Will need a short course of Suboxone therapy.  Will prescribe 1 week of Suboxone on discharge.  Patient should not be using oxycodone while on Suboxone.  Discontinue.  DVT prophylaxis: SCD's Start: 12/09/22 1137   Code Status: Full code Family Communication: None at bedside Disposition Plan: Status is: Inpatient Remains inpatient appropriate because: Completing antibiotic therapy     Consultants:  Infectious disease Neurosurgery Orthopedics  Procedures:  Multiple seizures as above  Antimicrobials:  Daptomycin until 2/11   Subjective: Patient seen in the morning rounds.  Denies any complaints.  Discussed with nursing staff.  He had multiple failed voiding trials so we will need to go home with Foley catheter.  Teach catheter care. Patient thinks he can go home this weekend.  Objective: Vitals:   01/31/23 1725 01/31/23 1900 02/01/23 0400 02/01/23 0827  BP: (!) 111/58 100/66 103/60 109/72  Pulse: 82 94 91 78  Resp:  18 16   Temp: 98.3 F (36.8 C) 97.7 F (36.5 C) 98.5 F (36.9 C) 97.6 F (36.4 C)  TempSrc: Oral Oral Oral Oral  SpO2:  100% 100% 95% 99%  Weight:      Height:        Intake/Output Summary (Last 24 hours) at 02/01/2023 1401 Last data filed at 01/31/2023 2200 Gross per 24 hour  Intake 307 ml  Output 2050 ml  Net -1743 ml   Filed Weights   12/18/22 1522 12/18/22 1801 01/26/23 0500  Weight: (!) 144.3 kg 134.1 kg 135 kg    Examination:  Looks comfortable.  Eating breakfast.  On room air.    Data Reviewed: I have personally reviewed following labs and imaging studies  CBC: Recent Labs  Lab 01/30/23 0342 02/01/23 0732  WBC 10.4 9.7  HGB 7.3* 7.2*  HCT 21.9* 22.5*  MCV 88.3 89.3  PLT 244 [id number]   Basic Metabolic Panel: Recent Labs  Lab 01/28/23 0524 01/30/23 0342 01/31/23 0616 02/01/23 0732  NA 132* 136 137 139  K 4.0 3.8 4.3 4.5  CL 101 102 102 104  CO2 20* 21* 23 24  GLUCOSE 97 103* 94 93  BUN 60* 51* 45* 45*  CREATININE 4.24* 4.06* 3.91* 3.82*  CALCIUM 9.1 8.9 9.1 9.1  PHOS 5.2*  --  6.0*  --    GFR: Estimated Creatinine Clearance: 35.7 mL/min (A) (by C-G formula based on SCr of 3.82 mg/dL (H)). Liver Function Tests: Recent Labs  Lab 01/28/23 0523 01/28/23 0524 01/31/23 0616  AST 14*  --   --   ALT 14  --   --   ALKPHOS 55  --   --   BILITOT 0.3  --   --   PROT 7.3  --   --  ALBUMIN 3.1* 3.1* 3.0*   No results for input(s): "LIPASE", "AMYLASE" in the last 168 hours. No results for input(s): "AMMONIA" in the last 168 hours. Coagulation Profile: No results for input(s): "INR", "PROTIME" in the last 168 hours. Cardiac Enzymes: Recent Labs  Lab 01/28/23 0523 02/01/23 0732  CKTOTAL 39* 37*   BNP (last 3 results) No results for input(s): "PROBNP" in the last 8760 hours. HbA1C: No results for input(s): "HGBA1C" in the last 72 hours. CBG: No results for input(s): "GLUCAP" in the last 168 hours. Lipid Profile: No results for input(s): "CHOL", "HDL", "LDLCALC", "TRIG", "CHOLHDL", "LDLDIRECT" in the last 72 hours. Thyroid Function Tests: No results for input(s):  "TSH", "T4TOTAL", "FREET4", "T3FREE", "THYROIDAB" in the last 72 hours. Anemia Panel: No results for input(s): "VITAMINB12", "FOLATE", "FERRITIN", "TIBC", "IRON", "RETICCTPCT" in the last 72 hours. Sepsis Labs: No results for input(s): "PROCALCITON", "LATICACIDVEN" in the last 168 hours.  No results found for this or any previous visit (from the past 240 hour(s)).       Radiology Studies: MR Lumbar Spine W Wo Contrast  Result Date: 01/30/2023 CLINICAL DATA:  Initial evaluation for lumbar radiculopathy. EXAM: MRI LUMBAR SPINE WITHOUT AND WITH CONTRAST TECHNIQUE: Multiplanar and multiecho pulse sequences of the lumbar spine were obtained without and with intravenous contrast. CONTRAST:  76m GADAVIST GADOBUTROL 1 MMOL/ML IV SOLN COMPARISON:  Prior study from 12/31/2022. FINDINGS: Segmentation:  Standard. Alignment: Stable alignment with underlying dextroscoliosis. Trace degenerative retrolisthesis of L1 on L2 and L2 on L3. Vertebrae: Persistent findings of osteomyelitis discitis at L3-4. There has been progressive disc space height loss in the interim. Persistent fairly robust marrow edema and enhancement is seen. The surrounding paraspinous inflammatory changes again seen, improved from prior. Bilateral psoas collections are improved, with only a tiny residual 4 mm collection now seen at the anterior margin of the right psoas muscle (series 6, image 30). Associated epidural involvement is also improved. Mild residual epidural enhancement/phlegmon seen extending from L2-3 through L5-S1. No frank epidural abscess now visualized. Mild edema and enhancement also seen about the right L3-4 facet, also mildly improved. No other evidence for new infection elsewhere within the lumbar spine. Vertebral body height otherwise maintained. No interval fracture. Underlying bone marrow signal intensity within normal limits. No worrisome osseous lesions. Conus medullaris and cauda equina: Conus extends to the T12-L1  level. Conus and cauda equina appear normal. Paraspinal and other soft tissues: Improving paraspinous inflammatory changes as above. Postoperative changes from prior posterior decompression at L3-4. Small T2 hyperintense cyst noted at the left kidney, benign in appearance, no follow-up imaging recommended. Previously seen inflammatory changes about the right kidney are also improved and/or resolved. Disc levels: Underlying multilevel degenerative spondylosis and facet arthrosis extending from L1-2 through L5-S1, not significantly changed from recent exam. IMPRESSION: 1. Persistent findings of osteomyelitis discitis at L3-4. Associated paraspinous inflammatory changes have improved, with near interval resolution of previously seen psoas collections. Mild residual epidural enhancement/phlegmon without frank epidural abscess. 2. No other new infection elsewhere within the lumbar spine. 3. Underlying multilevel degenerative spondylosis and facet arthrosis, relatively stable from prior. Electronically Signed   By: BJeannine BogaM.D.   On: 01/30/2023 21:29        Scheduled Meds:  buprenorphine-naloxone  1 tablet Sublingual BID   Chlorhexidine Gluconate Cloth  6 each Topical Daily   vitamin B-12  1,000 mcg Oral Daily   ferrous sulfate  325 mg Oral Q breakfast   finasteride  5 mg Oral Daily  folic acid  1 mg Oral Daily   leptospermum manuka honey  1 Application Topical Daily   magnesium oxide  400 mg Oral BID   nutrition supplement (JUVEN)  1 packet Oral BID BM   pantoprazole  40 mg Oral Daily   polyethylene glycol  17 g Oral Daily   senna-docusate  1 tablet Oral BID   sertraline  25 mg Oral Daily   tamsulosin  0.8 mg Oral QPC supper   thiamine  100 mg Oral Daily   Continuous Infusions:  DAPTOmycin (CUBICIN) 850 mg in sodium chloride 0.9 % IVPB 850 mg (01/31/23 2159)     LOS: 60 days    Time spent: 25 minutes    Barb Merino, MD [facility] Pager [phone]

## 2023-02-02 MED ORDER — ACETAMINOPHEN 325 MG PO TABS: 650.0000 mg | ORAL_TABLET | ORAL | Status: DC | PRN

## 2023-02-02 MED ORDER — ACETAMINOPHEN 325 MG PO TABS
650.0000 mg | ORAL_TABLET | ORAL | Status: DC | PRN
  Administered 2023-02-02 – 2023-02-03 (×2): 650 mg via ORAL
  Filled 2023-02-02: qty 2

## 2023-02-02 MED ORDER — BUPRENORPHINE HCL-NALOXONE HCL 2-0.5 MG SL SUBL: 1.0000 | SUBLINGUAL_TABLET | Freq: Two times a day (BID) | SUBLINGUAL | 0 refills | Status: AC

## 2023-02-02 NOTE — Progress Notes (Signed)
PROGRESS NOTE    Hunter Reyes  [id number] DOB: 05/29/1976 DOA: 12/02/2022 PCP: Celene Squibb, MD    Brief Narrative:  Here to complete antibiotic therapy until 2/11. 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  Medically stable.  He will complete antibiotic therapy on 2/11 and will be discharged home on doxycycline. In order to facilitate weekend discharge, all medications are prescribed and is in his floor.  Can go home after daptomycin dose tomorrow.   Patient currently does not have follow-up at Suboxone clinic, referral sent to pain clinic in [place].  They will see him next week.   Patient has been using Suboxone since last 2 months.   Sent prescription of Suboxone he is currently using to his pharmacy for 5 days until he can be seen in pain clinic.  DVT prophylaxis: SCD's Start: 12/09/22 1137   Code Status: Full code Family Communication: None at bedside Disposition Plan: Status is: Inpatient Remains inpatient appropriate because: Completing antibiotic therapy     Consultants:  Infectious disease Neurosurgery Orthopedics  Procedures:  Multiple seizures as above  Antimicrobials:  Daptomycin until 2/11   Subjective:  Patient seen and examined.  No overnight events.  He was not happy with discontinuing oxycodone but I told him that I am planning to discharge him with Suboxone and he cannot be on oxycodone and patient is agreeable to use some Tylenol.  He thinks he can go home tomorrow.  He will need some Suboxone until seen in the clinic.  Objective: Vitals:   02/01/23 1754 02/01/23 1900 02/02/23 0600 02/02/23 0800  BP: 102/71 105/64 111/60  118/61  Pulse: 79 88 77 79  Resp:  16 16 16  $ Temp: 97.9 F (36.6 C) 98.4 F (36.9 C) 98 F (36.7 C) 98.4 F (36.9 C)  TempSrc: Oral Oral Oral Oral  SpO2: 100% 99% 100% 100%  Weight:      Height:        Intake/Output Summary (Last 24 hours) at 02/02/2023 1415 Last data filed at 02/02/2023 0600 Gross per 24 hour  Intake 480 ml  Output 3200 ml  Net -2720 ml   Filed Weights   12/18/22 1522 12/18/22 1801 01/26/23 0500  Weight: (!) 144.3 kg 134.1 kg 135 kg    Examination:  Looks comfortable.  Eating lunch.  On room air.    Data Reviewed: I have personally reviewed following labs and imaging studies  CBC: Recent Labs  Lab 01/30/23 0342 02/01/23 0732  WBC 10.4 9.7  HGB 7.3* 7.2*  HCT 21.9* 22.5*  MCV 88.3 89.3  PLT 244 [id number]   Basic Metabolic Panel: Recent Labs  Lab 01/28/23 0524 01/30/23 0342 01/31/23 0616 02/01/23 0732  NA 132* 136 137 139  K 4.0 3.8 4.3 4.5  CL 101 102 102 104  CO2 20* 21* 23 24  GLUCOSE 97 103* 94 93  BUN 60* 51* 45* 45*  CREATININE 4.24* 4.06* 3.91* 3.82*  CALCIUM 9.1 8.9 9.1 9.1  PHOS 5.2*  --  6.0*  --    GFR: Estimated Creatinine Clearance: 35.7 mL/min (A) (by C-G formula based on SCr of 3.82 mg/dL (H)). Liver Function Tests: Recent Labs  Lab 01/28/23 [phone] 01/28/23  [id number] 01/31/23 0616  AST 14*  --   --   ALT 14  --   --   ALKPHOS 55  --   --   BILITOT 0.3  --   --   PROT 7.3  --   --   ALBUMIN 3.1* 3.1* 3.0*   No results for input(s): "LIPASE", "AMYLASE" in the last 168 hours. No results for input(s): "AMMONIA" in the last 168 hours. Coagulation Profile: No results for input(s): "INR", "PROTIME" in the last 168 hours. Cardiac Enzymes: Recent Labs  Lab 01/28/23 0523 02/01/23 0732  CKTOTAL 39* 37*   BNP (last 3 results) No results for input(s): "PROBNP" in the last 8760 hours. HbA1C: No results for input(s): "HGBA1C" in the last 72 hours. CBG: No results for input(s): "GLUCAP" in the last 168 hours. Lipid  Profile: No results for input(s): "CHOL", "HDL", "LDLCALC", "TRIG", "CHOLHDL", "LDLDIRECT" in the last 72 hours. Thyroid Function Tests: No results for input(s): "TSH", "T4TOTAL", "FREET4", "T3FREE", "THYROIDAB" in the last 72 hours. Anemia Panel: No results for input(s): "VITAMINB12", "FOLATE", "FERRITIN", "TIBC", "IRON", "RETICCTPCT" in the last 72 hours. Sepsis Labs: No results for input(s): "PROCALCITON", "LATICACIDVEN" in the last 168 hours.  No results found for this or any previous visit (from the past 240 hour(s)).       Radiology Studies: No results found.      Scheduled Meds:  buprenorphine-naloxone  1 tablet Sublingual BID   Chlorhexidine Gluconate Cloth  6 each Topical Daily   vitamin B-12  1,000 mcg Oral Daily   ferrous sulfate  325 mg Oral Q breakfast   finasteride  5 mg Oral Daily   folic acid  1 mg Oral Daily   leptospermum manuka honey  1 Application Topical Daily   magnesium oxide  400 mg Oral BID   nutrition supplement (JUVEN)  1 packet Oral BID BM   pantoprazole  40 mg Oral Daily   polyethylene glycol  17 g Oral Daily   senna-docusate  1 tablet Oral BID   sertraline  25 mg Oral Daily   tamsulosin  0.8 mg Oral QPC supper   thiamine  100 mg Oral Daily   Continuous Infusions:  DAPTOmycin (CUBICIN) 850 mg in sodium chloride 0.9 % IVPB 850 mg (01/31/23 2159)     LOS: 61 days    Time spent: 25 minutes    Barb Merino, MD [facility] Pager [phone]

## 2023-02-02 NOTE — TOC Progression Note (Addendum)
Transition of Care [facility]) - Progression Note    Patient Details  Name: Hunter Reyes MRN: [id number] Date of Birth: May 21, 1976  [facility]) CM/SW Contact  Carles Collet, RN Phone Number: 02/02/2023, 9:57 AM  Clinical Narrative:     Damaris Schooner w patient, he will need transportation home, will utilize cab voucher.Cab voucher to home address, confirmed by patient, placed on front of charge. Nurse made aware. Patient states that his sisters Vaughan Basta and Mardene Celeste will be home tomorrow to let him in. Vaughan Basta does not have a number listed, patient confirms he does not know her new cell phone number. LVM for Mardene Celeste requesting call back, will notify her of DC planned for Sunday if she calls back. Spoke again to patient and he assures me that sister is aware that he is coming home tomorrow and be there to let him in the home.  Discussed DME, rollator to be delivered to room some time today in preporation for DC tomorrow.  Referrals for outpatient therapies have been done.  Provided patient with jeans and a flannel from closet, there were no shoes in his size, please double up on socks.   Expected Discharge Plan: [place] Barriers to Discharge: Continued Medical Work up  Expected Discharge Plan and Services     Post Acute Care Choice:  (Outpatient PT) Living arrangements for the past 2 months: Single Family Home                 DME Arranged: Walker rolling with seat DME Agency: [employer] Date DME Agency Contacted: 02/02/23 Time DME Agency Contacted: [phone] Representative spoke with at DME Agency: [place] Determinants of Health ([place]) Interventions [place]: Food Insecurity Present (12/04/2022)  Housing: High Risk (12/04/2022)  Transportation Needs: Unmet Transportation Needs (12/04/2022)  Utilities: At Risk (12/04/2022)  Tobacco Use: High Risk (12/18/2022)    Readmission Risk Interventions     No data  to display

## 2023-02-02 NOTE — Progress Notes (Addendum)
Physical Therapy Treatment Patient Details Name: Hunter Reyes MRN: [id number] DOB: March 26, 1976 Today's Date: 02/02/2023   History of Present Illness 47 y.o. male admitted 12/10 with AMS, urinary retention and low back pain. Underwent L4 lumbar laminectomy for epidural abscess 12/13. s/p R foot transmet amp 12/17. Pt initiated HD on 12/26. PMH:  bipolar 1 disorder, borderline personality disorder, COPD, crack cocaine abuse, depression, peripheral neuropathy, history of right foot osteomyelitis, panic attack and history of seizures    PT Comments    Majority of acute functional goals met. Adequate for d/c from mobility standpoint with recommended equipment and HHPT. Ambulating >100 feet at supervision level. Able to transfer without physical assistance. Eager to return home. All questions answered. Declines having any stairs to navigate. Will follow until d/c.   Recommendations for follow up therapy are one component of a multi-disciplinary discharge planning process, led by the attending physician.  Recommendations may be updated based on patient status, additional functional criteria and insurance authorization.  Follow Up Recommendations  Home health PT (Declines snf due to financial restraints) Can patient physically be transported by private vehicle: Yes   Assistance Recommended at Discharge Intermittent Supervision/Assistance  Patient can return home with the following Assistance with cooking/housework;Assist for transportation;Help with stairs or ramp for entrance;A little help with bathing/dressing/bathroom;A little help with walking and/or transfers   Equipment Recommendations  Rollator; BSC/3in1   Recommendations for Other Services       Precautions / Restrictions Precautions Precautions: Fall;Back Precaution Comments: Able to state precautions; noted some difficulty putting precautions into practice with functional mobility Required Braces or Orthoses: Other Brace Other  Brace: post op shoe- CAM boot R foot Restrictions Weight Bearing Restrictions: No RLE Weight Bearing: Weight bearing as tolerated Other Position/Activity Restrictions: with post op shoe- updated weight bearing per Dr. Sharol Given on 01/15/23     Mobility  Bed Mobility Overal bed mobility: Modified Independent             General bed mobility comments: No physical assist needed    Transfers Overall transfer level: Needs assistance Equipment used: Rolling walker (2 wheels) Transfers: Sit to/from Stand Sit to Stand: Supervision           General transfer comment: supervision for safety, able to rise from bed x2 today and toilet x1 without physical help.    Ambulation/Gait Ambulation/Gait assistance: Supervision Gait Distance (Feet): 125 Feet Assistive device: Rolling walker (2 wheels) Gait Pattern/deviations: Step-through pattern Gait velocity: decr Gait velocity interpretation: <1.8 ft/sec, indicate of risk for recurrent falls   General Gait Details: Supervision for safety. Still needs intermittent cues for upright posture and walker proximity however improved from last visit. No buckling noted today. Somewhat fatigued at end of distance but remains encouraged.   Stairs             Wheelchair Mobility    Modified Rankin (Stroke Patients Only)       Balance Overall balance assessment: Needs assistance Sitting-balance support: No upper extremity supported, Feet supported Sitting balance-[name] Scale: Good     Standing balance support: During functional activity, Bilateral upper extremity supported Standing balance-[name] Scale: Fair Standing balance comment: Able to stand short periods without UE suppor.t                            Cognition Arousal/Alertness: Awake/alert Behavior During Therapy: WFL for tasks assessed/performed Overall Cognitive Status: No family/caregiver present to determine baseline cognitive functioning  Exercises      General Comments        Pertinent Vitals/Pain Pain Assessment Pain Assessment: Faces Faces Pain Scale: Hurts little more Pain Location: back with mobility Pain Descriptors / Indicators: Grimacing, Guarding Pain Intervention(s): Monitored during session    Home Living                          Prior Function            PT Goals (current goals can now be found in the care plan section) Acute Rehab PT Goals PT Goal Formulation: With patient Time For Goal Achievement: 02/04/23 Potential to Achieve Goals: Fair Progress towards PT goals: Progressing toward goals    Frequency    Min 3X/week      PT Plan Current plan remains appropriate    Co-evaluation              AM-PAC PT "6 Clicks" Mobility   Outcome Measure  Help needed turning from your back to your side while in a flat bed without using bedrails?: None Help needed moving from lying on your back to sitting on the side of a flat bed without using bedrails?: None Help needed moving to and from a bed to a chair (including a wheelchair)?: A Little Help needed standing up from a chair using your arms (e.g., wheelchair or bedside chair)?: A Little Help needed to walk in hospital room?: A Little Help needed climbing 3-5 steps with a railing? : A Lot 6 Click Score: 19    End of Session Equipment Utilized During Treatment: Gait belt Activity Tolerance: Patient tolerated treatment well Patient left: in bed;with call bell/phone within reach;with bed alarm set Nurse Communication: Mobility status PT Visit Diagnosis: Muscle weakness (generalized) (M62.81);Pain;Other abnormalities of gait and mobility (R26.89) Pain - Right/Left: Right Pain - part of body: Ankle and joints of foot     Time: 1517-1550 PT Time Calculation (min) (ACUTE ONLY): 33 min  Charges:  $Gait Training: 8-22 mins $Therapeutic Activity: 8-22 mins                     Hunter Reyes, PT, DPT Physical Therapist Acute Rehabilitation Services [place]    Hunter Reyes 02/02/2023, 4:43 PM

## 2023-02-03 NOTE — Plan of Care (Signed)
  Problem: Fluid Volume: Goal: Hemodynamic stability will improve Outcome: Adequate for Discharge   Problem: Clinical Measurements: Goal: Diagnostic test results will improve Outcome: Adequate for Discharge Goal: Signs and symptoms of infection will decrease Outcome: Adequate for Discharge   Problem: Respiratory: Goal: Ability to maintain adequate ventilation will improve Outcome: Adequate for Discharge   Problem: Education: Goal: Knowledge of General Education information will improve Description: Including pain rating scale, medication(s)/side effects and non-pharmacologic comfort measures Outcome: Adequate for Discharge   Problem: Health Behavior/Discharge Planning: Goal: Ability to manage health-related needs will improve Outcome: Adequate for Discharge   Problem: Clinical Measurements: Goal: Ability to maintain clinical measurements within normal limits will improve Outcome: Adequate for Discharge Goal: Will remain free from infection Outcome: Adequate for Discharge Goal: Diagnostic test results will improve Outcome: Adequate for Discharge Goal: Respiratory complications will improve Outcome: Adequate for Discharge Goal: Cardiovascular complication will be avoided Outcome: Adequate for Discharge   Problem: Activity: Goal: Risk for activity intolerance will decrease Outcome: Adequate for Discharge   Problem: Nutrition: Goal: Adequate nutrition will be maintained Outcome: Adequate for Discharge   Problem: Coping: Goal: Level of anxiety will decrease Outcome: Adequate for Discharge   Problem: Elimination: Goal: Will not experience complications related to bowel motility Outcome: Adequate for Discharge Goal: Will not experience complications related to urinary retention Outcome: Adequate for Discharge   Problem: Pain Managment: Goal: General experience of comfort will improve Outcome: Adequate for Discharge   Problem: Safety: Goal: Ability to remain free  from injury will improve Outcome: Adequate for Discharge   Problem: Skin Integrity: Goal: Risk for impaired skin integrity will decrease Outcome: Adequate for Discharge   Problem: Education: Goal: Knowledge of the prescribed therapeutic regimen will improve Outcome: Adequate for Discharge Goal: Ability to verbalize activity precautions or restrictions will improve Outcome: Adequate for Discharge Goal: Understanding of discharge needs will improve Outcome: Adequate for Discharge   Problem: Activity: Goal: Ability to perform//tolerate increased activity and mobilize with assistive devices will improve Outcome: Adequate for Discharge   Problem: Clinical Measurements: Goal: Postoperative complications will be avoided or minimized Outcome: Adequate for Discharge   Problem: Self-Care: Goal: Ability to meet self-care needs will improve Outcome: Adequate for Discharge   Problem: Self-Concept: Goal: Ability to maintain and perform role responsibilities to the fullest extent possible will improve Outcome: Adequate for Discharge   Problem: Pain Management: Goal: Pain level will decrease with appropriate interventions Outcome: Adequate for Discharge   Problem: Skin Integrity: Goal: Demonstration of wound healing without infection will improve Outcome: Adequate for Discharge

## 2023-02-03 NOTE — TOC Transition Note (Signed)
Transition of Care [facility]) - CM/SW Discharge Note   Patient Details  Name: Hunter Reyes MRN: [id number] Date of Birth: Dec 16, 1976  [facility] CM/SW Contact:  Carles Collet, RN Phone Number: 02/03/2023, 1:02 PM   Clinical Narrative:     Cab voucher on chart. Rollator delivered to room.  Meds filled by [facility] pharmacy, reminded nurse to make sure he gets them before he goes.  OP referrals for therapies have been made.  No other TOC needs identified.   Final next level of care: Home/Self Care Barriers to Discharge: Continued Medical Work up   Patient Goals and CMS Choice CMS Medicare.gov Compare Post Acute Care list provided to:: Patient Choice offered to / list presented to : Patient  Discharge Placement                         Discharge Plan and Services Additional resources added to the After Visit Summary for       Post Acute Care Choice:  (Outpatient PT)          DME Arranged: Walker rolling with seat DME Agency: [employer] Date DME Agency Contacted: 02/02/23 Time DME Agency Contacted: [phone] Representative spoke with at DME Agency: [place] Determinants of Health ([place]) Interventions [place]: Food Insecurity Present (12/04/2022)  Housing: High Risk (12/04/2022)  Transportation Needs: Unmet Transportation Needs (12/04/2022)  Utilities: At Risk (12/04/2022)  Tobacco Use: High Risk (12/18/2022)     Readmission Risk Interventions     No data to display

## 2023-02-03 NOTE — Hospital Course (Signed)
47 y.o. male with past medical history significant for bipolar disorder, polysubstance abuse, COPD, chronic hepatitis B, peripheral neuropathy, chronic pain syndrome, history of right foot osteomyelitis who presented to [facility] ED on 12/10 with confusion, low back pain and urinary retention.  Workup in the ED was notable for obstructive uropathy with acute renal failure and Foley catheter was placed.  Patient did require hemodialysis earlier in the hospital course which has now been discontinued.  Imaging also notable for L4 epidural abscess with subacute symptoms of cauda equina syndrome and patient underwent right L4 laminectomy and debridement on 12/05/2022.  Subsequent blood cultures with MRSA bacteremia.  Additionally patient was found to have an abscess and osteomyelitis of his right foot in which a transmetatarsal amputation was performed on 12/09/2022.  Infectious disease was consulted

## 2023-02-03 NOTE — Progress Notes (Signed)
IV removed, patients belongings packed. Medications brought up to bedside and instructions gone over. Pt changed. Taxi voucher given for patient to get home.

## 2023-02-03 NOTE — Discharge Summary (Signed)
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 *  Hospital Course: 47 y.o. male with past medical history significant for bipolar disorder, polysubstance abuse, COPD, chronic hepatitis B, peripheral neuropathy, chronic pain syndrome, history of right foot osteomyelitis who presented to [facility] ED on 12/10 with confusion, low back pain and urinary retention.  Workup in the ED was notable for obstructive uropathy with acute renal failure and Foley catheter was placed.  Patient did require hemodialysis earlier in the hospital course which has now been discontinued.  Imaging also notable for L4 epidural abscess with subacute symptoms of cauda equina syndrome and patient underwent right L4 laminectomy and debridement on 12/05/2022.  Subsequent blood cultures with MRSA bacteremia.  Additionally patient was found to have an abscess and osteomyelitis of his right foot in which a transmetatarsal amputation was performed on 12/09/2022.  Infectious disease was consulted   Assessment and Plan: MRSA bacteremia L3/4 discitis with epidural abscess s/p laminectomy/debridement Patient presenting to ED with low back pain,  confusion.  Imaging on admission was notable for large ventral epidural abscess with compression of the cauda equina nerve roots at the level L4.  Neurosurgery was consulted and patient underwent right L4 laminectomy with sublaminar decompression with debridement of the ventral epidural abscess by Dr. Kathyrn Sheriff on 12/05/2022.  Patient was started on empiric antibiotics and infectious disease was consulted and followed during hospital course.  Blood cultures from 12/13 positive for MRSA.  Repeat blood cultures on 12/06/2022 showed no growth.  Repeat MR L-spine with and without contrast 2/7 with persistent osteomyelitis/discitis L3-4, associated paraspinous inflammatory changes improved with near interval resolution of psoas collection, mild residual epidermal enhancement/phlegmon without frank epidural abscess, no other new infection elsewhere within the lumbar spine. -- Completed daptomycin x 6 weeks per ID (stop date: 02/03/2023) -- ID planned doxycycline 100 mg p.o. twice daily following completion of IV antibiotics until outpatient follow-up   [place]. [place] discussed with ID, Dr. Tommy Medal on 2/8 regarding follow-up MRI; agree with plan to continue IV daptomycin until 2/11 followed by doxycycline on discharge until follow-up with ID.   Right foot abscess/osteomyelitis s/p TMA MR right foot with fluid collection plantar aspect of the second and third metatarsal heads measuring 4.4 x 2.2 x 2.3 cm consistent with abscess and bone marrow edema concerning for osteomyelitis.  Orthopedics was consulted and patient underwent transmetatarsal amputation on 12/17.  On antibiotics as above.  Weightbearing as tolerates postoperative shoe.  Outpatient follow-up with orthopedics.   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  Foley catheter  was placed but patient did require hemodialysis during initial hospitalization; which has now been discontinued.  Attempts at Foley catheter discontinuation have been unsuccessful, was replaced.  Continue tamsulosin, finasteride.  Outpatient follow-up with nephrology.    Hyperkalemia Related to renal failure, now resolved.   Bipolar disorder/mood disorder -- Zoloft 25 mg p.o. daily   Pressure injury right heel -- Continue offloading, Medihoney to heal with Xerofoam to the transmetatarsal site.   Anemia of chronic medical/renal disease Hemoglobin 7.3, stable.  Transfused 1 unit PRBC on 12/17/2022.   Chronic pain syndrome History of polysubstance abuse/IVDU -- On Suboxone 1 tab p.o. twice daily -- Previously followed with pain clinic       Consultants: Orthopedic Surgery, ID, Neurosurgery, Nephrology Procedures performed: Right L4 laminectomy and debridement, Dr. Kathyrn Sheriff 12/05/2022 Right trans metatarsal imitation, orthopedics, Dr. Sharol Given 12/09/2022  Disposition: Home Diet recommendation:  Regular diet DISCHARGE MEDICATION: Allergies as of 02/03/2023       Reactions   Darvocet [propoxyphene N-acetaminophen] Hives, Itching   Tramadol Hives, Itching   Other Hives        Medication List     STOP taking these medications    albuterol 108 (90 Base) MCG/ACT inhaler Commonly known as: VENTOLIN HFA   gabapentin 300 MG capsule Commonly known as: NEURONTIN       TAKE these medications    acetaminophen 325 MG tablet Commonly known as: TYLENOL Take 2 tablets (650 mg total) by mouth every 4 (four) hours as needed for mild pain or moderate pain.   buprenorphine-naloxone 2-0.5 mg Subl SL tablet Commonly known as: SUBOXONE Place 1 tablet under the tongue 2 (two) times daily for 5 days.   cyanocobalamin 1000 MCG tablet Commonly known as: VITAMIN B12 Take 1 tablet (1,000 mcg total) by mouth daily.   doxycycline 100 MG tablet Commonly known as: VIBRA-TABS Take 1 tablet  (100 mg total) by mouth 2 (two) times daily. Notes to patient: Separate morning dose from daily ferrous sulfate dose by at least 2 hours   FeroSul 325 (65 FE) MG tablet Generic drug: ferrous sulfate Take 1 tablet (325 mg total) by mouth daily with breakfast.   finasteride 5 MG tablet Commonly known as: PROSCAR Take 1 tablet (5 mg total) by mouth daily.   folic acid 1 MG tablet Commonly known as: FOLVITE Take 1 tablet (1 mg total) by mouth daily.   ibuprofen 600 MG tablet Commonly known as: ADVIL Take 1 tablet (600 mg total) by mouth 4 (four) times daily. What changed:  when to take this reasons to take this   polyethylene glycol powder 17 GM/SCOOP powder Commonly known as: GLYCOLAX/MIRALAX Take 17 g by mouth daily.   senna-docusate 8.6-50 MG tablet Commonly known as: Senokot-S Take 1 tablet by mouth 2 (two) times daily.   sertraline 50 MG tablet Commonly known as: ZOLOFT Take 0.5 tablets (25 mg total) by mouth daily.   tamsulosin 0.4 MG Caps capsule Commonly known as: FLOMAX Take 2 capsules (0.8 mg total) by mouth daily after supper.   tiZANidine 4 MG tablet Commonly known as: ZANAFLEX Take 1 tablet (4 mg total) by mouth every 6 (six) hours as needed for muscle spasms.   traZODone 50 MG tablet Commonly known as: DESYREL Take 0.5 tablets (25 mg total) by mouth at bedtime as needed for up to 14 days for sleep.  Durable Medical Equipment  (From admission, onward)           Start     Ordered   02/02/23 0954  For home use only DME 4 wheeled rolling walker with seat  Once       Question:  Patient needs a walker to treat with the following condition  Answer:  Weakness   02/02/23 0953   01/28/23 1814  For home use only DME standard manual wheelchair with seat cushion  Once       Comments: Patient suffers from amputation which impairs their ability to perform daily activities like dressing, feeding, grooming, and toileting in the home.  A cane,  crutch, or walker will not resolve issue with performing activities of daily living. A wheelchair will allow patient to safely perform daily activities. Patient can safely propel the wheelchair in the home or has a caregiver who can provide assistance. Length of need Lifetime. Accessories: elevating leg rests (ELRs), wheel locks, extensions and anti-tippers.   01/28/23 1813   01/28/23 1813  For home use only DME Walker rolling  Once       Question Answer Comment  Walker: With 5 Inch Wheels   Patient needs a walker to treat with the following condition Amputation at midfoot [facility])      01/28/23 1813            Follow-up Information     Newt Minion, MD Follow up in 1 week(s).   Specialty: Orthopedic Surgery Contact information: [place] [address] [place] [facility] [place] Follow up.   Specialty: Rehabilitation Why: Referral placed electronically for physical and occupation therapy. They will call youto set up an appointment Contact information: [place] [id number] Prudy Feeler [place]        Celene Squibb, MD Follow up.   Specialty: Internal Medicine Why: please call to arrange follow up appt with your Primary Care Provider Contact information: [address] [place]t [facility] [address] [place] Follow up.   Why: [facility], please bring valid ID and insurance card with you to appt. Please call them on early Monday to arrange appt Contact information: [address] [place], [place] [address] [phone]        Franchot Gallo, MD. Schedule an appointment as soon as possible for a visit.   Specialty: Urology Why: Hospital follow up Contact information: [address] [place] [address] [phone]                Discharge Exam: Danley Danker Weights   12/18/22 1522  12/18/22 1801 01/26/23 0500  Weight: (!) 144.3 kg 134.1 kg 135 kg   General exam: Awake, laying in bed, in nad Respiratory system: Normal respiratory effort, no wheezing Cardiovascular system: regular rate, s1, s2 Gastrointestinal system: Soft, nondistended, positive BS Central nervous system: CN2-12 grossly intact, strength intact Extremities: Perfused, no clubbing Skin: Normal skin turgor, no notable skin lesions seen Psychiatry: Mood normal // no visual hallucinations   Condition at discharge: fair  The results of significant diagnostics from this hospitalization (including imaging, microbiology, ancillary and laboratory) are listed below for reference.   Imaging Studies: MR Lumbar Spine W Wo Contrast  Result Date: 01/30/2023 CLINICAL DATA:  Initial evaluation for lumbar radiculopathy. EXAM: MRI LUMBAR SPINE WITHOUT AND  WITH CONTRAST TECHNIQUE: Multiplanar and multiecho pulse sequences of the lumbar spine were obtained without and with intravenous contrast. CONTRAST:  75m GADAVIST GADOBUTROL 1 MMOL/ML IV SOLN COMPARISON:  Prior study from 12/31/2022. FINDINGS: Segmentation:  Standard. Alignment: Stable alignment with underlying dextroscoliosis. Trace degenerative retrolisthesis of L1 on L2 and L2 on L3. Vertebrae: Persistent findings of osteomyelitis discitis at L3-4. There has been progressive disc space height loss in the interim. Persistent fairly robust marrow edema and enhancement is seen. The surrounding paraspinous inflammatory changes again seen, improved from prior. Bilateral psoas collections are improved, with only a tiny residual 4 mm collection now seen at the anterior margin of the right psoas muscle (series 6, image 30). Associated epidural involvement is also improved. Mild residual epidural enhancement/phlegmon seen extending from L2-3 through L5-S1. No frank epidural abscess now visualized. Mild edema and enhancement also seen about the right L3-4 facet, also mildly improved.  No other evidence for new infection elsewhere within the lumbar spine. Vertebral body height otherwise maintained. No interval fracture. Underlying bone marrow signal intensity within normal limits. No worrisome osseous lesions. Conus medullaris and cauda equina: Conus extends to the T12-L1 level. Conus and cauda equina appear normal. Paraspinal and other soft tissues: Improving paraspinous inflammatory changes as above. Postoperative changes from prior posterior decompression at L3-4. Small T2 hyperintense cyst noted at the left kidney, benign in appearance, no follow-up imaging recommended. Previously seen inflammatory changes about the right kidney are also improved and/or resolved. Disc levels: Underlying multilevel degenerative spondylosis and facet arthrosis extending from L1-2 through L5-S1, not significantly changed from recent exam. IMPRESSION: 1. Persistent findings of osteomyelitis discitis at L3-4. Associated paraspinous inflammatory changes have improved, with near interval resolution of previously seen psoas collections. Mild residual epidural enhancement/phlegmon without frank epidural abscess. 2. No other new infection elsewhere within the lumbar spine. 3. Underlying multilevel degenerative spondylosis and facet arthrosis, relatively stable from prior. Electronically Signed   By: BJeannine BogaM.D.   On: 01/30/2023 21:29    Microbiology: Results for orders placed or performed during the hospital encounter of 12/02/22  Resp Panel by RT-PCR (Flu A&B, Covid) Anterior Nasal Swab     Status: None   Collection Time: 12/02/22 11:30 PM   Specimen: Anterior Nasal Swab  Result Value Ref Range Status   SARS Coronavirus 2 by RT PCR NEGATIVE NEGATIVE Final    Comment: (NOTE) SARS-CoV-2 target nucleic acids are NOT DETECTED.  The SARS-CoV-2 RNA is generally detectable in upper respiratory specimens during the acute phase of infection. The lowest concentration of SARS-CoV-2 viral copies this  assay can detect is 138 copies/mL. A negative result does not preclude SARS-Cov-2 infection and should not be used as the sole basis for treatment or other patient management decisions. A negative result may occur with  improper specimen collection/handling, submission of specimen other than nasopharyngeal swab, presence of viral mutation(s) within the areas targeted by this assay, and inadequate number of viral copies(<138 copies/mL). A negative result must be combined with clinical observations, patient history, and epidemiological information. The expected result is Negative.  Fact Sheet for Patients:  hEntrepreneurPulse.com.au Fact Sheet for Healthcare Providers:  hIncredibleEmployment.be This test is no t yet approved or cleared by the [place] and  has been authorized for detection and/or diagnosis of SARS-CoV-2 by FDA under an Emergency Use Authorization (EUA). This EUA will remain  in effect (meaning this test can be used) for the duration of the COVID-19 declaration under Section 564(b)(1) of  the Act, 21 U.S.C.section 360bbb-3(b)(1), unless the authorization is terminated  or revoked sooner.       Influenza A by PCR NEGATIVE NEGATIVE Final   Influenza B by PCR NEGATIVE NEGATIVE Final    Comment: (NOTE) The Xpert Xpress SARS-CoV-2/FLU/RSV plus assay is intended as an aid in the diagnosis of influenza from Nasopharyngeal swab specimens and should not be used as a sole basis for treatment. Nasal washings and aspirates are unacceptable for Xpert Xpress SARS-CoV-2/FLU/RSV testing.  Fact Sheet for Patients: EntrepreneurPulse.com.au  Fact Sheet for Healthcare Providers: IncredibleEmployment.be  This test is not yet approved or cleared by the [place] FDA and has been authorized for detection and/or diagnosis of SARS-CoV-2 by FDA under an Emergency Use Authorization (EUA). This EUA will  remain in effect (meaning this test can be used) for the duration of the COVID-19 declaration under Section 564(b)(1) of the Act, 21 U.S.C. section 360bbb-3(b)(1), unless the authorization is terminated or revoked.  Performed at [facility], [address] [place], [place] [address]   Blood culture (routine x 2)     Status: Abnormal   Collection Time: 12/03/22  4:00 AM   Specimen: Site Not Specified; Blood  Result Value Ref Range Status   Specimen Description   Final    SITE NOT SPECIFIED BOTTLES DRAWN AEROBIC AND ANAEROBIC Performed at [facility], [address], [place], [place] [address]    Special Requests   Final    Blood Culture adequate volume Performed at [place] [place], [place] [address]    Culture  Setup Time   Final    GRAM POSITIVE COCCI AEB BOTTLES DRAWN AEROBIC ONLY Gram Stain Report Called to,Read Back By and Verified With: REECE THOMPSON,RN @2143$  ON 12/04/22 BY SSLANE CRITICAL RESULT CALLED TO, READ BACK BY AND VERIFIED WITH: G ABBOTT,PHARMD@0200$  12/05/22 [place] GRAM POSITIVE COCCI ANAEROBIC BOTTLE ONLY RESULT PREVIOUSLY CALLED Performed at [facility], [address], [place], [place] [address]    Culture METHICILLIN RESISTANT STAPHYLOCOCCUS AUREUS (A)  Final   Report Status 12/08/2022 FINAL  Final   Organism ID, Bacteria METHICILLIN RESISTANT STAPHYLOCOCCUS AUREUS  Final      Susceptibility   Methicillin resistant staphylococcus aureus - MIC*    CIPROFLOXACIN >=8 RESISTANT Resistant     ERYTHROMYCIN >=8 RESISTANT Resistant     GENTAMICIN <=0.5 SENSITIVE Sensitive     OXACILLIN >=4 RESISTANT Resistant     TETRACYCLINE <=1 SENSITIVE Sensitive     VANCOMYCIN <=0.5 SENSITIVE Sensitive     TRIMETH/SULFA 20 SENSITIVE Sensitive     CLINDAMYCIN <=0.25 SENSITIVE Sensitive     RIFAMPIN <=0.5 SENSITIVE Sensitive     Inducible Clindamycin NEGATIVE Sensitive     * METHICILLIN RESISTANT STAPHYLOCOCCUS AUREUS  Blood Culture ID Panel (Reflexed)      Status: Abnormal   Collection Time: 12/03/22  4:00 AM  Result Value Ref Range Status   Enterococcus faecalis NOT DETECTED NOT DETECTED Final   Enterococcus Faecium NOT DETECTED NOT DETECTED Final   Listeria monocytogenes NOT DETECTED NOT DETECTED Final   Staphylococcus species DETECTED (A) NOT DETECTED Final    Comment: CRITICAL RESULT CALLED TO, READ BACK BY AND VERIFIED WITH: G ABBOTT,PHARMD@0200$  12/05/22 [place]    Staphylococcus aureus (BCID) DETECTED (A) NOT DETECTED Final    Comment: Methicillin (oxacillin)-resistant Staphylococcus aureus (MRSA). MRSA is predictably resistant to beta-lactam antibiotics (except ceftaroline). Preferred therapy is vancomycin unless clinically contraindicated. Patient requires contact precautions if  hospitalized. CRITICAL RESULT CALLED TO, READ BACK  BY AND VERIFIED WITH: G ABBOTT,PHARMD@0200$  12/05/22 [place]    Staphylococcus epidermidis NOT DETECTED NOT DETECTED Final   Staphylococcus lugdunensis NOT DETECTED NOT DETECTED Final   Streptococcus species NOT DETECTED NOT DETECTED Final   Streptococcus agalactiae NOT DETECTED NOT DETECTED Final   Streptococcus pneumoniae NOT DETECTED NOT DETECTED Final   Streptococcus pyogenes NOT DETECTED NOT DETECTED Final   A.calcoaceticus-baumannii NOT DETECTED NOT DETECTED Final   Bacteroides fragilis NOT DETECTED NOT DETECTED Final   Enterobacterales NOT DETECTED NOT DETECTED Final   Enterobacter cloacae complex NOT DETECTED NOT DETECTED Final   Escherichia coli NOT DETECTED NOT DETECTED Final   Klebsiella aerogenes NOT DETECTED NOT DETECTED Final   Klebsiella oxytoca NOT DETECTED NOT DETECTED Final   Klebsiella pneumoniae NOT DETECTED NOT DETECTED Final   Proteus species NOT DETECTED NOT DETECTED Final   Salmonella species NOT DETECTED NOT DETECTED Final   Serratia marcescens NOT DETECTED NOT DETECTED Final   Haemophilus influenzae NOT DETECTED NOT DETECTED Final   Neisseria meningitidis NOT DETECTED NOT DETECTED Final    Pseudomonas aeruginosa NOT DETECTED NOT DETECTED Final   Stenotrophomonas maltophilia NOT DETECTED NOT DETECTED Final   Candida albicans NOT DETECTED NOT DETECTED Final   Candida auris NOT DETECTED NOT DETECTED Final   Candida glabrata NOT DETECTED NOT DETECTED Final   Candida krusei NOT DETECTED NOT DETECTED Final   Candida parapsilosis NOT DETECTED NOT DETECTED Final   Candida tropicalis NOT DETECTED NOT DETECTED Final   Cryptococcus neoformans/gattii NOT DETECTED NOT DETECTED Final   Meth resistant mecA/C and MREJ DETECTED (A) NOT DETECTED Final    Comment: CRITICAL RESULT CALLED TO, READ BACK BY AND VERIFIED WITH: G ABBOTT,PHARMD@0200$  12/05/22 [place] Performed at [facility], [address], [place], [place] [address]   Urine Culture     Status: Abnormal   Collection Time: 12/03/22  4:01 AM   Specimen: Urine, Catheterized  Result Value Ref Range Status   Specimen Description   Final    URINE, CATHETERIZED Performed at [facility], [address], [place], [place] [address]    Special Requests   Final    NONE Performed at [facility], [address] [place], [place] [address]    Culture (A)  Final    20,000 COLONIES/mL METHICILLIN RESISTANT STAPHYLOCOCCUS AUREUS   Report Status 12/05/2022 FINAL  Final   Organism ID, Bacteria METHICILLIN RESISTANT STAPHYLOCOCCUS AUREUS (A)  Final      Susceptibility   Methicillin resistant staphylococcus aureus - MIC*    CIPROFLOXACIN >=8 RESISTANT Resistant     GENTAMICIN <=0.5 SENSITIVE Sensitive     NITROFURANTOIN 32 SENSITIVE Sensitive     OXACILLIN >=4 RESISTANT Resistant     TETRACYCLINE <=1 SENSITIVE Sensitive     VANCOMYCIN <=0.5 SENSITIVE Sensitive     TRIMETH/SULFA <=10 SENSITIVE Sensitive     CLINDAMYCIN <=0.25 SENSITIVE Sensitive     RIFAMPIN <=0.5 SENSITIVE Sensitive     Inducible Clindamycin NEGATIVE Sensitive     * 20,000 COLONIES/mL METHICILLIN RESISTANT STAPHYLOCOCCUS AUREUS  Blood culture (routine x 2)     Status:  Abnormal   Collection Time: 12/03/22  4:10 AM   Specimen: BLOOD  Result Value Ref Range Status   Specimen Description BLOOD SITE NOT SPECIFIED  Final   Special Requests   Final    BOTTLES DRAWN AEROBIC AND ANAEROBIC Blood Culture adequate volume   Culture  Setup Time   Final    CRITICAL RESULT CALLED TO, READ BACK BY AND VERIFIED  WITH: IN BOTH AEROBIC AND ANAEROBIC BOTTLES GRAM POSITIVE COCCI Gram Stain Report Called to,Read Back By and Verified With: NAKIA MCGOWAN @ [id number] ON 12/05/22 [place] CRITICAL RESULT CALLED TO, READ BACK BY AND VERIFIED WITH: RN Minna Merritts [facility] ON 12/05/22 @ 1950 BY DRT    Culture (A)  Final    STAPHYLOCOCCUS AUREUS SUSCEPTIBILITIES PERFORMED ON PREVIOUS CULTURE WITHIN THE LAST 5 DAYS. Performed at [facility], [place] [address], [place], [place] [address]    Report Status 12/08/2022 FINAL  Final  Blood Culture ID Panel (Reflexed)     Status: Abnormal   Collection Time: 12/03/22  4:10 AM  Result Value Ref Range Status   Enterococcus faecalis NOT DETECTED NOT DETECTED Final   Enterococcus Faecium NOT DETECTED NOT DETECTED Final   Listeria monocytogenes NOT DETECTED NOT DETECTED Final   Staphylococcus species DETECTED (A) NOT DETECTED Final    Comment: CRITICAL RESULT CALLED TO, READ BACK BY AND VERIFIED WITH: RN Minna Merritts [facility] ON 12/05/22 @ 1950 BY DRT    Staphylococcus aureus (BCID) DETECTED (A) NOT DETECTED Final    Comment: Methicillin (oxacillin)-resistant Staphylococcus aureus (MRSA). MRSA is predictably resistant to beta-lactam antibiotics (except ceftaroline). Preferred therapy is vancomycin unless clinically contraindicated. Patient requires contact precautions if  hospitalized. CRITICAL RESULT CALLED TO, READ BACK BY AND VERIFIED WITH: RN Minna Merritts [facility] ON 12/05/22 @ 1950 BY DRT    Staphylococcus epidermidis NOT DETECTED NOT DETECTED Final   Staphylococcus lugdunensis NOT DETECTED NOT DETECTED Final   Streptococcus species NOT DETECTED NOT  DETECTED Final   Streptococcus agalactiae NOT DETECTED NOT DETECTED Final   Streptococcus pneumoniae NOT DETECTED NOT DETECTED Final   Streptococcus pyogenes NOT DETECTED NOT DETECTED Final   A.calcoaceticus-baumannii NOT DETECTED NOT DETECTED Final   Bacteroides fragilis NOT DETECTED NOT DETECTED Final   Enterobacterales NOT DETECTED NOT DETECTED Final   Enterobacter cloacae complex NOT DETECTED NOT DETECTED Final   Escherichia coli NOT DETECTED NOT DETECTED Final   Klebsiella aerogenes NOT DETECTED NOT DETECTED Final   Klebsiella oxytoca NOT DETECTED NOT DETECTED Final   Klebsiella pneumoniae NOT DETECTED NOT DETECTED Final   Proteus species NOT DETECTED NOT DETECTED Final   Salmonella species NOT DETECTED NOT DETECTED Final   Serratia marcescens NOT DETECTED NOT DETECTED Final   Haemophilus influenzae NOT DETECTED NOT DETECTED Final   Neisseria meningitidis NOT DETECTED NOT DETECTED Final   Pseudomonas aeruginosa NOT DETECTED NOT DETECTED Final   Stenotrophomonas maltophilia NOT DETECTED NOT DETECTED Final   Candida albicans NOT DETECTED NOT DETECTED Final   Candida auris NOT DETECTED NOT DETECTED Final   Candida glabrata NOT DETECTED NOT DETECTED Final   Candida krusei NOT DETECTED NOT DETECTED Final   Candida parapsilosis NOT DETECTED NOT DETECTED Final   Candida tropicalis NOT DETECTED NOT DETECTED Final   Cryptococcus neoformans/gattii NOT DETECTED NOT DETECTED Final   Meth resistant mecA/C and MREJ DETECTED (A) NOT DETECTED Final    Comment: CRITICAL RESULT CALLED TO, READ BACK BY AND VERIFIED WITH: RN Minna Merritts [facility] ON 12/05/22 @ 1950 BY DRT Performed at [facility], [address], [place], [place] [address]   Culture, blood (Routine X 2) w Reflex to ID Panel     Status: Abnormal   Collection Time: 12/05/22  9:12 AM   Specimen: BLOOD  Result Value Ref Range Status   Specimen Description BLOOD LEFT ANTECUBITAL  Final   Special Requests   Final    BOTTLES DRAWN  AEROBIC AND ANAEROBIC  Blood Culture adequate volume   Culture  Setup Time   Final    GRAM POSITIVE COCCI IN CLUSTERS IN BOTH AEROBIC AND ANAEROBIC BOTTLES CRITICAL RESULT CALLED TO, READ BACK BY AND VERIFIED WITHFerne Coe PHARMD, AT 1334 12/06/22 D. VANHOOK    Culture (A)  Final    STAPHYLOCOCCUS SPECIES (COAGULASE NEGATIVE) STAPHYLOCOCCUS AUREUS SUSCEPTIBILITIES PERFORMED ON PREVIOUS CULTURE WITHIN THE LAST 5 DAYS. Performed at [facility], [place] [address], [place], [place] [address]    Report Status 12/08/2022 FINAL  Final   Organism ID, Bacteria STAPHYLOCOCCUS SPECIES (COAGULASE NEGATIVE)  Final      Susceptibility   Staphylococcus species (coagulase negative) - MIC*    CIPROFLOXACIN >=8 RESISTANT Resistant     ERYTHROMYCIN RESISTANT Resistant     GENTAMICIN <=0.5 SENSITIVE Sensitive     OXACILLIN >=4 RESISTANT Resistant     TETRACYCLINE <=1 SENSITIVE Sensitive     VANCOMYCIN <=0.5 SENSITIVE Sensitive     TRIMETH/SULFA <=10 SENSITIVE Sensitive     CLINDAMYCIN RESISTANT Resistant     RIFAMPIN <=0.5 SENSITIVE Sensitive     Inducible Clindamycin POSITIVE Resistant     * STAPHYLOCOCCUS SPECIES (COAGULASE NEGATIVE)  Culture, blood (Routine X 2) w Reflex to ID Panel     Status: Abnormal   Collection Time: 12/05/22  9:15 AM   Specimen: BLOOD LEFT WRIST  Result Value Ref Range Status   Specimen Description BLOOD LEFT WRIST  Final   Special Requests   Final    BOTTLES DRAWN AEROBIC AND ANAEROBIC Blood Culture adequate volume   Culture  Setup Time   Final    GRAM POSITIVE COCCI IN CLUSTERS AEROBIC BOTTLE ONLY CRITICAL VALUE NOTED.  VALUE IS CONSISTENT WITH PREVIOUSLY REPORTED AND CALLED VALUE.    Culture (A)  Final    STAPHYLOCOCCUS SPECIES (COAGULASE NEGATIVE) SUSCEPTIBILITIES PERFORMED ON PREVIOUS CULTURE WITHIN THE LAST 5 DAYS. Performed at [facility], [place] [address] [place], [place] [address]    Report Status 12/08/2022 FINAL  Final  Aerobic/Anaerobic  Culture w Gram Stain (surgical/deep wound)     Status: None   Collection Time: 12/05/22  7:52 PM   Specimen: Abscess  Result Value Ref Range Status   Specimen Description ABSCESS  Final   Special Requests LUMBAR 4 EPIDURAL SPACE  Final   Gram Stain   Final    RARE WBC PRESENT, PREDOMINANTLY PMN NO ORGANISMS SEEN    Culture   Final    RARE METHICILLIN RESISTANT STAPHYLOCOCCUS AUREUS NO ANAEROBES ISOLATED Performed at [facility], [place] [address], [place],  [address]    Report Status 12/10/2022 FINAL  Final   Organism ID, Bacteria METHICILLIN RESISTANT STAPHYLOCOCCUS AUREUS  Final      Susceptibility   Methicillin resistant staphylococcus aureus - MIC*    CIPROFLOXACIN >=8 RESISTANT Resistant     ERYTHROMYCIN >=8 RESISTANT Resistant     GENTAMICIN <=0.5 SENSITIVE Sensitive     OXACILLIN >=4 RESISTANT Resistant     TETRACYCLINE <=1 SENSITIVE Sensitive     VANCOMYCIN <=0.5 SENSITIVE Sensitive     TRIMETH/SULFA <=10 SENSITIVE Sensitive     CLINDAMYCIN <=0.25 SENSITIVE Sensitive     RIFAMPIN <=0.5 SENSITIVE Sensitive     Inducible Clindamycin NEGATIVE Sensitive     * RARE METHICILLIN RESISTANT STAPHYLOCOCCUS AUREUS  Culture, blood (Routine X 2) w Reflex to ID Panel     Status: None   Collection Time: 12/06/22  3:09 PM   Specimen: BLOOD LEFT  HAND  Result Value Ref Range Status   Specimen Description BLOOD LEFT HAND  Final   Special Requests   Final    BOTTLES DRAWN AEROBIC ONLY Blood Culture adequate volume   Culture   Final    NO GROWTH 5 DAYS Performed at [facility], [address] [place], [place] [address]    Report Status 12/11/2022 FINAL  Final  Culture, blood (Routine X 2) w Reflex to ID Panel     Status: None   Collection Time: 12/06/22  3:10 PM   Specimen: BLOOD RIGHT HAND  Result Value Ref Range Status   Specimen Description BLOOD RIGHT HAND  Final   Special Requests   Final    BOTTLES DRAWN AEROBIC AND ANAEROBIC Blood Culture adequate volume    Culture   Final    NO GROWTH 5 DAYS Performed at [facility], [place] [address] [place], [place] [address]    Report Status 12/11/2022 FINAL  Final    Labs: CBC: Recent Labs  Lab 01/30/23 0342 02/01/23 0732  WBC 10.4 9.7  HGB 7.3* 7.2*  HCT 21.9* 22.5*  MCV 88.3 89.3  PLT 244 [id number]   Basic Metabolic Panel: Recent Labs  Lab 01/28/23 0524 01/30/23 0342 01/31/23 0616 02/01/23 0732  NA 132* 136 137 139  K 4.0 3.8 4.3 4.5  CL 101 102 102 104  CO2 20* 21* 23 24  GLUCOSE 97 103* 94 93  BUN 60* 51* 45* 45*  CREATININE 4.24* 4.06* 3.91* 3.82*  CALCIUM 9.1 8.9 9.1 9.1  PHOS 5.2*  --  6.0*  --    Liver Function Tests: Recent Labs  Lab 01/28/23 0523 01/28/23 0524 01/31/23 0616  AST 14*  --   --   ALT 14  --   --   ALKPHOS 55  --   --   BILITOT 0.3  --   --   PROT 7.3  --   --   ALBUMIN 3.1* 3.1* 3.0*   CBG: No results for input(s): "GLUCAP" in the last 168 hours.  Discharge time spent: less than 30 minutes.  Signed: Marylu Lund, MD [facility] 02/03/2023

## 2023-02-03 NOTE — Progress Notes (Signed)
Mobility Specialist Progress Note   02/03/23 1121  Mobility  Activity Ambulated with assistance in hallway  Level of Assistance Contact guard assist, steadying assist  Assistive Device Front wheel walker  Distance Ambulated (ft) 180 ft  RLE Weight Bearing WBAT (w/ Cam Boot)  Activity Response Tolerated well  Mobility Referral Yes  $Mobility charge 1 Mobility   Received pt in bed c/o LBP 5/10 but agreeable. Standby A to EOB and MinA to donn cam boot, before getting up pt able to recall back precautions. Requesting to use BR prior to session, no faults on transfer but requiring verbal cues for safe RW mechanics, unsuccessful BM but flatus present. X3 standing rest break d/t LUE fatigue but able to make it back to room w/o incident. Left in chair c/o back pain and L shoulder pain, supplied heating and ice pack and notified RN.      Holland Falling Mobility Specialist Please contact via SecureChat or  Rehab office at [phone]

## 2023-02-15 ENCOUNTER — Other Ambulatory Visit (HOSPITAL_COMMUNITY): Payer: Self-pay

## 2023-02-21 ENCOUNTER — Other Ambulatory Visit (HOSPITAL_COMMUNITY): Payer: Self-pay

## 2023-02-21 ENCOUNTER — Other Ambulatory Visit: Payer: Self-pay

## 2023-02-23 ENCOUNTER — Other Ambulatory Visit (HOSPITAL_COMMUNITY): Payer: Self-pay

## 2023-03-04 ENCOUNTER — Ambulatory Visit: Payer: Medicaid Other | Admitting: Orthopedic Surgery

## 2023-03-28 ENCOUNTER — Telehealth: Payer: Medicaid Other | Admitting: Family Medicine

## 2023-03-28 DIAGNOSIS — R399 Unspecified symptoms and signs involving the genitourinary system: Secondary | ICD-10-CM

## 2023-03-29 NOTE — Progress Notes (Signed)
Because Mr. Ilsley, I feel your condition warrants further evaluation and I recommend that you be seen in a face to face visit.   NOTE: There will be NO CHARGE for this eVisit   If you are having a true medical emergency please call 911.      For an urgent face to face visit, [place] has eight urgent care centers for your convenience:   NEW!! [facility] Health Urgent Care Center at [facility] Get Driving Directions [phone] [address] [place], [address]    [facility] Health Urgent Care Center at [facility] Get Driving Directions [phone] [address] [place], [place] [address]   [facility] Care Center [facility]) Get Driving Directions [phone] [address] [place], [place] [address]  [facility] Health Urgent Care Center [facility]) Get Driving Directions [phone] [address] [place],  [place]  [address]  [facility] Health Urgent Care Center [facility] - at [employer]  [phone] [address] [place],  [place] [address]   [facility] Health Urgent Care at [facility] Get Driving Directions [phone] [address] [place], [place] [address]   [facility] Health Urgent Care at [facility] Get Driving Directions  [phone] [address] [place], [place] [address]   [facility] Health Urgent Care at [facility] Directions [phone] [address], [address] [place], [place] [address]  Your MyChart E-visit questionnaire answers were reviewed by a board certified advanced clinical practitioner to complete your personal care plan based on your specific symptoms.  Thank you for using e-Visits.    E-Visit for Urinary Problems  Based on what you shared with me, I feel your condition warrants further evaluation and I recommend that you be seen for a face to face office visit.  Male bladder infections are not very  common.  We worry about prostate or kidney conditions.  The standard of care is to examine the abdomen and kidneys, and to do a urine and blood test to make sure that something more serious is not going on.  We recommend that you see a provider today.  If your doctor's office is closed [place] has the following Urgent Cares:    NOTE: You will not be charged for this e-visit.  If you are having a true medical emergency please call 911.       For an urgent face to face visit, [place] has six urgent care centers for your convenience:     [facility] Health Urgent Care Center at [facility] Directions [phone] [address] [place], [place] [address]    [facility] Health Urgent Care Center [facility]) Get Driving Directions [phone] [address] [place], [place] [address]  [facility] Care Center [facility]) Get Driving Directions [phone] [address] [place],  [place]  [address]  [facility] Health Urgent Care at [facility] Get Driving Directions [phone] [address] [place] [address] [place], [place] [address]   [facility] Health Urgent Care at [facility] Get Driving Directions  [phone] [address] [place], [place] [address]   [facility] Health Urgent Care at [facility] Directions [phone] [address] [place], [place] [address]  Your MyChart E-visit questionnaire answers were reviewed by a board certified advanced clinical practitioner to complete your personal care plan based on your specific symptoms.  Thank you for using e-Visits.

## 2023-04-19 ENCOUNTER — Other Ambulatory Visit (HOSPITAL_COMMUNITY): Payer: Self-pay

## 2023-05-14 ENCOUNTER — Ambulatory Visit: Payer: Medicaid Other | Admitting: Urology

## 2023-07-22 ENCOUNTER — Other Ambulatory Visit (HOSPITAL_COMMUNITY): Payer: Self-pay

## 2023-07-22 ENCOUNTER — Encounter: Payer: Self-pay | Admitting: Pharmacist

## 2023-07-22 ENCOUNTER — Other Ambulatory Visit: Payer: Self-pay

## 2023-07-25 ENCOUNTER — Other Ambulatory Visit: Payer: Self-pay

## 2023-12-19 ENCOUNTER — Encounter (HOSPITAL_COMMUNITY): Payer: Self-pay

## 2023-12-19 ENCOUNTER — Emergency Department (HOSPITAL_COMMUNITY): Payer: Medicaid Other

## 2023-12-19 ENCOUNTER — Emergency Department (HOSPITAL_COMMUNITY)
Admission: EM | Admit: 2023-12-19 | Discharge: 2023-12-19 | Disposition: A | Discharge status: Home / Self Care | Payer: Medicaid Other | Attending: Emergency Medicine | Admitting: Emergency Medicine

## 2023-12-19 ENCOUNTER — Other Ambulatory Visit: Payer: Self-pay

## 2023-12-19 DIAGNOSIS — D649 Anemia, unspecified: Secondary | ICD-10-CM | POA: Diagnosis not present

## 2023-12-19 DIAGNOSIS — N390 Urinary tract infection, site not specified: Secondary | ICD-10-CM | POA: Diagnosis not present

## 2023-12-19 DIAGNOSIS — R7989 Other specified abnormal findings of blood chemistry: Secondary | ICD-10-CM | POA: Insufficient documentation

## 2023-12-19 DIAGNOSIS — R300 Dysuria: Secondary | ICD-10-CM | POA: Diagnosis present

## 2023-12-19 DIAGNOSIS — R7981 Abnormal blood-gas level: Secondary | ICD-10-CM | POA: Insufficient documentation

## 2023-12-19 DIAGNOSIS — R319 Hematuria, unspecified: Secondary | ICD-10-CM | POA: Diagnosis not present

## 2023-12-19 DIAGNOSIS — D72829 Elevated white blood cell count, unspecified: Secondary | ICD-10-CM | POA: Diagnosis not present

## 2023-12-19 DIAGNOSIS — R339 Retention of urine, unspecified: Secondary | ICD-10-CM

## 2023-12-19 LAB — CBC WITH DIFFERENTIAL/PLATELET
Abs Immature Granulocytes: 0 10*3/uL (ref 0.00–0.07)
Band Neutrophils: 1 %
Basophils Absolute: 0.2 10*3/uL — ABNORMAL HIGH (ref 0.0–0.1)
Basophils Relative: 1 %
Eosinophils Absolute: 0.2 10*3/uL (ref 0.0–0.5)
Eosinophils Relative: 1 %
HCT: 39.7 % (ref 39.0–52.0)
Hemoglobin: 12.6 g/dL — ABNORMAL LOW (ref 13.0–17.0)
Lymphocytes Relative: 8 %
Lymphs Abs: 1.7 10*3/uL (ref 0.7–4.0)
MCH: 27.3 pg (ref 26.0–34.0)
MCHC: 31.7 g/dL (ref 30.0–36.0)
MCV: 85.9 fL (ref 80.0–100.0)
Monocytes Absolute: 1.5 10*3/uL — ABNORMAL HIGH (ref 0.1–1.0)
Monocytes Relative: 7 %
Neutro Abs: 17.4 10*3/uL — ABNORMAL HIGH (ref 1.7–7.7)
Neutrophils Relative %: 82 %
Platelets: 263 10*3/uL (ref 150–400)
RBC: 4.62 MIL/uL (ref 4.22–5.81)
RDW: 14.6 % (ref 11.5–15.5)
WBC: 21 10*3/uL — ABNORMAL HIGH (ref 4.0–10.5)
nRBC: 0 % (ref 0.0–0.2)

## 2023-12-19 LAB — URINALYSIS, W/ REFLEX TO CULTURE (INFECTION SUSPECTED)
Bilirubin Urine: NEGATIVE
Glucose, UA: NEGATIVE mg/dL
Ketones, ur: NEGATIVE mg/dL
Nitrite: POSITIVE — AB
Protein, ur: 100 mg/dL — AB
Specific Gravity, Urine: 1.017 (ref 1.005–1.030)
pH: 5 (ref 5.0–8.0)

## 2023-12-19 LAB — BASIC METABOLIC PANEL
Anion gap: 8 (ref 5–15)
BUN: 27 mg/dL — ABNORMAL HIGH (ref 6–20)
CO2: 21 mmol/L — ABNORMAL LOW (ref 22–32)
Calcium: 8.8 mg/dL — ABNORMAL LOW (ref 8.9–10.3)
Chloride: 111 mmol/L (ref 98–111)
Creatinine, Ser: 2.22 mg/dL — ABNORMAL HIGH (ref 0.61–1.24)
GFR, Estimated: 36 mL/min — ABNORMAL LOW (ref >60)
Glucose, Bld: 125 mg/dL — ABNORMAL HIGH (ref 70–99)
Potassium: 4.1 mmol/L (ref 3.5–5.1)
Sodium: 140 mmol/L (ref 135–145)

## 2023-12-19 MED ORDER — SODIUM CHLORIDE 0.9 % IV SOLN
1.0000 g | Freq: Once | INTRAVENOUS | Status: AC
  Administered 2023-12-19: 1 g via INTRAVENOUS
  Filled 2023-12-19: qty 10

## 2023-12-19 MED ORDER — CEFPODOXIME PROXETIL 200 MG PO TABS: 200.0000 mg | ORAL_TABLET | Freq: Two times a day (BID) | ORAL | 0 refills | Status: AC

## 2023-12-19 NOTE — ED Triage Notes (Signed)
Pt reports burning with urination and does not feel like he is completely emptying his bladder for 2 days.

## 2023-12-19 NOTE — ED Provider Notes (Signed)
[place] EMERGENCY DEPARTMENT AT [facility] Provider Note   CSN: [id number] Arrival date & time: 12/19/23  1151     History  Chief Complaint  Patient presents with   Dysuria    Hunter Reyes is a 47 y.o. male past medical history significant for acute urinary retention presents today for dysuria, flank pain, suprapubic pain, and feeling like he is not completely emptying his bladder for 2 days.  Patient also endorses chills.  Patient denies nausea, vomiting, headache, shortness of breath, diarrhea, fatigue, or weakness.  He states the last time he urinated was last night and he felt it was more of a dribble.   Dysuria Presenting symptoms: dysuria   Associated symptoms: abdominal pain and flank pain        Home Medications Prior to Admission medications   Medication Sig Start Date End Date Taking? Authorizing Provider  cefpodoxime (VANTIN) 200 MG tablet Take 1 tablet (200 mg total) by mouth 2 (two) times daily for 14 days. 12/19/23 01/02/24 Yes Dolphus Jenny, PA-C  acetaminophen (TYLENOL) 325 MG tablet Take 2 tablets (650 mg total) by mouth every 4 (four) hours as needed for mild pain or moderate pain. 02/02/23   Dorcas Carrow, MD  doxycycline (VIBRA-TABS) 100 MG tablet Take 1 tablet (100 mg total) by mouth 2 (two) times daily. 02/01/23   Dorcas Carrow, MD  ferrous sulfate 325 (65 FE) MG tablet Take 1 tablet (325 mg total) by mouth daily with breakfast. 02/02/23 03/04/23  Dorcas Carrow, MD  ibuprofen (ADVIL,MOTRIN) 600 MG tablet Take 1 tablet (600 mg total) by mouth 4 (four) times daily. Patient taking differently: Take 600 mg by mouth every 8 (eight) hours as needed for mild pain. 06/27/17   Ivery Quale, PA-C  polyethylene glycol powder (GLYCOLAX/MIRALAX) 17 GM/SCOOP powder Take 17 g by mouth daily. 02/02/23   Dorcas Carrow, MD  senna-docusate (SENOKOT-S) 8.6-50 MG tablet Take 1 tablet by mouth 2 (two) times daily. 02/01/23   Dorcas Carrow, MD  sertraline (ZOLOFT)  50 MG tablet Take 0.5 tablets (25 mg total) by mouth daily. 02/01/23 03/03/23  Dorcas Carrow, MD  tiZANidine (ZANAFLEX) 4 MG tablet Take 1 tablet (4 mg total) by mouth every 6 (six) hours as needed for muscle spasms. 02/01/23   Dorcas Carrow, MD  traZODone (DESYREL) 50 MG tablet Take 0.5 tablets (25 mg total) by mouth at bedtime as needed for up to 14 days for sleep. 02/01/23 02/15/23  Dorcas Carrow, MD      Allergies    Darvocet [propoxyphene n-acetaminophen], Tramadol, and Other    Review of Systems   Review of Systems  Gastrointestinal:  Positive for abdominal pain.  Genitourinary:  Positive for decreased urine volume, difficulty urinating, dysuria and flank pain.    Physical Exam Updated Vital Signs BP (!) 110/58 (BP Location: Left Arm)   Pulse 97   Temp 98.9 F (37.2 C) (Oral)   Resp 20   Ht 6\' 2"  (1.88 m)   Wt 124.7 kg   SpO2 93%   BMI 35.31 kg/m  Physical Exam Vitals and nursing note reviewed.  Constitutional:      General: He is not in acute distress.    Appearance: He is well-developed. He is obese.  HENT:     Head: Normocephalic and atraumatic.     Right Ear: External ear normal.     Left Ear: External ear normal.     Nose: Nose normal.  Eyes:     Extraocular Movements:  Extraocular movements intact.     Conjunctiva/sclera: Conjunctivae normal.  Cardiovascular:     Rate and Rhythm: Normal rate and regular rhythm.     Pulses: Normal pulses.     Heart sounds: Normal heart sounds. No murmur heard. Pulmonary:     Effort: Pulmonary effort is normal. No respiratory distress.     Breath sounds: Normal breath sounds. No wheezing.  Abdominal:     General: Bowel sounds are normal.     Palpations: Abdomen is soft.     Tenderness: There is abdominal tenderness in the suprapubic area. There is right CVA tenderness and left CVA tenderness. Negative signs include Murphy's sign, Rovsing's sign and McBurney's sign.  Musculoskeletal:        General: No swelling.     Cervical  back: Normal range of motion and neck supple.  Skin:    General: Skin is warm and dry.     Capillary Refill: Capillary refill takes less than 2 seconds.  Neurological:     General: No focal deficit present.     Mental Status: He is alert.     Motor: No weakness.  Psychiatric:        Mood and Affect: Mood normal.     ED Results / Procedures / Treatments   Labs (all labs ordered are listed, but only abnormal results are displayed) Labs Reviewed  CBC WITH DIFFERENTIAL/PLATELET - Abnormal; Notable for the following components:      Result Value   WBC 21.0 (*)    Hemoglobin 12.6 (*)    Neutro Abs 17.4 (*)    Monocytes Absolute 1.5 (*)    Basophils Absolute 0.2 (*)    All other components within normal limits  BASIC METABOLIC PANEL - Abnormal; Notable for the following components:   CO2 21 (*)    Glucose, Bld 125 (*)    BUN 27 (*)    Creatinine, Ser 2.22 (*)    Calcium 8.8 (*)    GFR, Estimated 36 (*)    All other components within normal limits  URINALYSIS, W/ REFLEX TO CULTURE (INFECTION SUSPECTED) - Abnormal; Notable for the following components:   APPearance HAZY (*)    Hgb urine dipstick LARGE (*)    Protein, ur 100 (*)    Nitrite POSITIVE (*)    Leukocytes,Ua SMALL (*)    Bacteria, UA FEW (*)    All other components within normal limits  URINE CULTURE    EKG None  Radiology CT ABDOMEN PELVIS WO CONTRAST Result Date: 12/19/2023 CLINICAL DATA:  Abdominal/flank pain, stone suspected Pyelonephritis suspected EXAM: CT ABDOMEN AND PELVIS WITHOUT CONTRAST TECHNIQUE: Multidetector CT imaging of the abdomen and pelvis was performed following the standard protocol without IV contrast. RADIATION DOSE REDUCTION: This exam was performed according to the departmental dose-optimization program which includes automated exposure control, adjustment of the mA and/or kV according to patient size and/or use of iterative reconstruction technique. COMPARISON:  CT scan renal stone protocol  from 12/03/2022. FINDINGS: Lower chest: The lung bases are clear. No pleural effusion. The heart is normal in size. No pericardial effusion. Hepatobiliary: The liver is normal in size. Non-cirrhotic configuration. No suspicious mass. No intrahepatic or extrahepatic bile duct dilation. No calcified gallstones. Normal gallbladder wall thickness. No pericholecystic inflammatory changes. Pancreas: Unremarkable. No pancreatic ductal dilatation or surrounding inflammatory changes. Spleen: Within normal limits. No focal lesion. Adrenals/Urinary Tract: Adrenal glands are unremarkable. No suspicious renal mass within the limitations of this unenhanced exam. Redemonstration of bilateral mild-to-moderate hydronephrosis  and entire hydroureter. No obstructing mass or ureterolithiasis seen. Bilateral mild perinephric fat stranding is similar to the prior study. No nephrolithiasis. Urinary bladder is distended. No focal mass, perivesical fat stranding or bladder calculi. Stomach/Bowel: No disproportionate dilation of the small or large bowel loops. No evidence of abnormal bowel wall thickening or inflammatory changes. The appendix is unremarkable. Vascular/Lymphatic: No ascites or pneumoperitoneum. No abdominal or pelvic lymphadenopathy, by size criteria. No aneurysmal dilation of the major abdominal arteries. There are mild peripheral atherosclerotic vascular calcifications of the aorta and its major branches. Reproductive: Normal size prostate. Symmetric seminal vesicles. Other: There is a tiny fat containing umbilical hernia. The soft tissues and abdominal wall are otherwise unremarkable. Musculoskeletal: No suspicious osseous lesions. There are mild multilevel degenerative changes in the visualized spine. Since the prior study, there is new mild loss of height of L3 vertebral body along with L3-4 endplates irregularity, compatible with sequela of discitis osteomyelitis. IMPRESSION: *No nephroureterolithiasis on either side.  Redemonstration of bilateral mild-to-moderate hydronephrosis and entire hydroureter without obstructing mass or ureterolithiasis. Findings are essentially similar to the prior study. Please note evaluation for pyelonephritis is markedly limited on this unenhanced exam. 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 *Multiple other nonacute observations, as described above. Electronically Signed   By: Jules Schick M.D.   On: 12/19/2023 14:38    Procedures Procedures    Medications Ordered in ED Medications  cefTRIAXone (ROCEPHIN) 1 g in sodium chloride 0.9 % 100 mL IVPB (has no administration in time range)    ED Course/ Medical Decision Making/ A&P                                 Medical Decision Making Amount and/or Complexity of Data Reviewed Labs: ordered.   This patient presents to the ED with chief complaint(s) of dysuria with pertinent past medical history of acute urinary retention and kidney disease which further complicates the presenting complaint. The complaint involves an extensive differential diagnosis and also carries with it a high risk of complications and morbidity.    The differential diagnosis includes UTI, urinary retention  Additional history obtained: Records reviewed previous admission documents  ED Course and Reassessment: Foley catheter inserted and 1450 cc of urine drained.  Independent labs interpretation:  The following labs were independently interpreted:  CBC: Leukocytosis at 21, mildly decreased hemoglobin at 12.6 BMP: Mildly decreased CO2, mildly elevated bun, elevated creatinine, mildly decreased calcium UA: Large hemoglobin, small leuks, positive nitrites, 100 protein, few bacteria, 21-50 WBCs  Independent visualization of imaging: - I independently visualized the following imaging with scope of interpretation limited to determining acute life threatening conditions related to emergency care: CT abdomen pelvis without contrast, which  revealed mild to moderate hydronephrosis and hydroureter without obstructing mass or urolithiasis.  Findings similar to prior study.  Evaluation for pyelonephritis markedly limited due to lack of contrast.  Consultation: - Consulted or discussed management/test interpretation w/ external professional: None  Consideration for admission or further workup: Considered for admission or further workup however patient's vital signs, physical exam, labs, and imaging have been reassuring.  Patient symptoms likely due to acute urinary retention which has been resolved with Foley catheter placement and urinary tract infection which was treated with 1 g of Rocephin while in the ER and 2 weeks of cefpodoxime outpatient.  Patient should follow-up with urology as soon as possible for further evaluation and workup.  Patient given  strict return precautions.        Final Clinical Impression(s) / ED Diagnoses Final diagnoses:  Urinary retention  Urinary tract infection with hematuria, site unspecified    Rx / DC Orders ED Discharge Orders          Ordered    cefpodoxime (VANTIN) 200 MG tablet  2 times daily        12/19/23 1530              Dolphus Jenny, PA-C 12/19/23 1534    Cathren Laine, MD 12/19/23 1652

## 2023-12-19 NOTE — Discharge Instructions (Addendum)
Today were seen for acute urinary retention and UTI.  Please pick up your antibiotics and take as prescribed.  Please follow-up with urology as soon as possible for further evaluation and workup.  Thank you for letting us treat you today. After reviewing your labs and imaging, I feel you are safe to go home. Please follow up with your PCP in the next several days and provide them with your records from this visit. Return to the Emergency Room if pain becomes severe or symptoms worsen.

## 2023-12-21 LAB — URINE CULTURE: Culture: 50000 — AB

## 2023-12-22 ENCOUNTER — Telehealth (HOSPITAL_BASED_OUTPATIENT_CLINIC_OR_DEPARTMENT_OTHER): Payer: Self-pay | Admitting: *Deleted

## 2023-12-22 NOTE — Telephone Encounter (Signed)
Post ED Visit - Positive Culture Follow-up  Culture report reviewed by antimicrobial stewardship pharmacist: [facility] []  Enzo Bi, Pharm.D. []  Celedonio Miyamoto, Pharm.D., BCPS AQ-ID []  Garvin Fila, Pharm.D., BCPS []  Georgina Pillion, Pharm.D., BCPS []  [place], [address].D., BCPS, AAHIVP []  Estella Husk, Pharm.D., BCPS, AAHIVP []  Lysle Pearl, PharmD, BCPS []  Phillips Climes, PharmD, BCPS []  Agapito Games, PharmD, BCPS []  Verlan Friends, PharmD []  Mervyn Gay, PharmD, BCPS [x]  Calton Dach, PharmD  [facility] []  Len Childs, PharmD []  Greer Pickerel, PharmD []  Adalberto Cole, PharmD []  Perlie Gold, Rph []  Lonell Face) Jean Rosenthal, PharmD []  Earl Many, PharmD []  Junita Push, PharmD []  Dorna Leitz, PharmD []  Terrilee Files, PharmD []  Lynann Beaver, PharmD []  Keturah Barre, PharmD []  Loralee Pacas, PharmD []  Bernadene Person, PharmD   Positive urine culture Treated with Cefpodoxime Proxetil, organism sensitive to the same and no further patient follow-up is required at this time.  Patsey Berthold 12/22/2023, 12:18 PM

## 2024-01-28 NOTE — Progress Notes (Signed)
 This encounter was created in error - please disregard.

## 2024-05-30 NOTE — ED Provider Notes (Addendum)
 Emergency Department Provider Note    ED Clinical Impression   Final diagnoses:  Urinary retention  Dehydration  Dysuria (Primary)    ED Assessment/Plan    History   Chief Complaint  Patient presents with  . Urinary Retention   HPI  1900 hrs. Patient 48 year old gentleman with bedbug infestation lives at home in [place] brought by EMS covered with feces and bedbugs placed in the detox room somnus in room 9 following Decon's had dysuria for several days difficulty urinating no other acute symptomatology vital signs reviewed Plan bladder scan urinary bladder Foley catheter placement will consider for outpatient treatment referral he is tachycardic and may consider some basic labs on this gentleman  Past Medical History:  Diagnosis Date  . Hepatitis B   . Osteomyelitis       No past surgical history on file.  History reviewed. No pertinent family history.  Social History   Socioeconomic History  . Marital status: Single    Spouse name: None  . Number of children: None  . Years of education: None  . Highest education level: None  Tobacco Use  . Smoking status: Every Day    Types: Cigarettes  . Smokeless tobacco: Never  Substance and Sexual Activity  . Alcohol use: Not Currently  . Drug use: Yes    Types: Marijuana   Social Drivers of Health   Food Insecurity: Food Insecurity Present (12/04/2022)   Received from [facility]   Hunger Vital Sign   . Worried About Programme researcher, broadcasting/film/video in the Last Year: Often true   . Ran Out of Food in the Last Year: Often true  Transportation Needs: Unmet Transportation Needs (12/04/2022)   Received from [facility] - Transportation   . Lack of Transportation (Medical): Yes   . Lack of Transportation (Non-Medical): Yes    Review of Systems  Genitourinary:  Positive for decreased urine volume and urgency.  All other systems reviewed and are negative.   Physical Exam   BP 152/103   Pulse 113   Temp 36.9 C  (98.5 F) (Oral)   Resp 20   Ht 188 cm (6' 2)   Wt (!) 124.7 kg (275 lb)   SpO2 98%   BMI 35.31 kg/m   Physical Exam  Vital signs have been reviewed. Patient is well-appearing w/o respiratory distress shock or major trauma. HEENT is atraumatic.  Neck shows unimpaired range of motion. Chest No increased work of breathing or audible wheezing. Cardiac Good perfusion throughout. Abdomen is not distended. Extremities are without significant trauma. Skin no visualized rash. Neuro exam is grossly non-focal. Psych exam shows normal mood and behavior.  ED Course    Urinary retention bladder scan IV fluids consider for outpatient treatment referral with leg bag   Medical Decision Making   2030 hrs.  Good urine output with some 800 cc plus we will send him home with a leg bag and Foley catheter in urology referral in [place] he has seen urology there in the past   [place], John James, MD 05/30/24 1904    [place] Norleen Agent, MD 05/30/24 2036

## 2024-05-30 NOTE — ED Triage Notes (Signed)
 BIBEMS c/o urinary retention, dysuria. Has prior hx of this and never followed up with nephrology/urology per patient. Patient noted to have several scabbed areas to both arms.

## 2024-05-30 NOTE — ED Notes (Signed)
 ED Procedure Note  EKG Interpretation  Date/Time: 05/30/2024 7:49 PM  Performed by: [place] Norleen Agent, MD Authorized by: [place] Norleen Agent, MD   ECG interpreted by ED Physician in the absence of a cardiologist: yes   Previous ECG:    Previous ECG:  Unavailable Interpretation:    Interpretation: normal   Rate:    ECG rate:  102   ECG rate assessment: normal   Rhythm:    Rhythm: sinus rhythm and sinus tachycardia   Ectopy:    Ectopy: none   QRS:    QRS axis:  Normal   QRS intervals:  Normal   QRS conduction: normal   ST segments:    ST segments:  Normal T waves:    T waves: normal   Q waves:    Abnormal Q-waves: not present   Comments:     Sinus rhythm at 102 no acute ST-T wave changes normal intervals early R transition

## 2024-05-30 NOTE — ED Notes (Signed)
 Pt discharged home.  Pt verbalized understanding of discharge instructions.  IV removed, Pt A&O x4 with steady gait and in no respiratory distress upon discharge.

## 2024-08-03 ENCOUNTER — Encounter (HOSPITAL_COMMUNITY): Payer: Self-pay

## 2024-08-03 ENCOUNTER — Other Ambulatory Visit: Payer: Self-pay | Admitting: Internal Medicine

## 2024-08-03 ENCOUNTER — Other Ambulatory Visit: Payer: Self-pay

## 2024-08-03 ENCOUNTER — Encounter (HOSPITAL_COMMUNITY): Payer: Self-pay | Admitting: Internal Medicine

## 2024-08-03 ENCOUNTER — Inpatient Hospital Stay (HOSPITAL_COMMUNITY)
Admit: 2024-08-03 | Discharge: 2024-08-05 | DRG: 690 | Disposition: A | Discharge status: Home / Self Care | Source: Ambulatory Visit | Attending: Internal Medicine | Admitting: Internal Medicine

## 2024-08-03 DIAGNOSIS — Z885 Allergy status to narcotic agent status: Secondary | ICD-10-CM

## 2024-08-03 DIAGNOSIS — D509 Iron deficiency anemia, unspecified: Principal | ICD-10-CM | POA: Diagnosis present

## 2024-08-03 DIAGNOSIS — Z8661 Personal history of infections of the central nervous system: Secondary | ICD-10-CM

## 2024-08-03 DIAGNOSIS — F17210 Nicotine dependence, cigarettes, uncomplicated: Secondary | ICD-10-CM | POA: Diagnosis present

## 2024-08-03 DIAGNOSIS — R338 Other retention of urine: Secondary | ICD-10-CM | POA: Diagnosis not present

## 2024-08-03 DIAGNOSIS — I120 Hypertensive chronic kidney disease with stage 5 chronic kidney disease or end stage renal disease: Secondary | ICD-10-CM | POA: Diagnosis present

## 2024-08-03 DIAGNOSIS — E1140 Type 2 diabetes mellitus with diabetic neuropathy, unspecified: Secondary | ICD-10-CM | POA: Diagnosis present

## 2024-08-03 DIAGNOSIS — N3001 Acute cystitis with hematuria: Secondary | ICD-10-CM | POA: Diagnosis not present

## 2024-08-03 DIAGNOSIS — F603 Borderline personality disorder: Secondary | ICD-10-CM | POA: Diagnosis present

## 2024-08-03 DIAGNOSIS — G834 Cauda equina syndrome: Secondary | ICD-10-CM | POA: Diagnosis present

## 2024-08-03 DIAGNOSIS — E66812 Obesity, class 2: Secondary | ICD-10-CM | POA: Diagnosis present

## 2024-08-03 DIAGNOSIS — N184 Chronic kidney disease, stage 4 (severe): Secondary | ICD-10-CM

## 2024-08-03 DIAGNOSIS — Z6835 Body mass index (BMI) 35.0-35.9, adult: Secondary | ICD-10-CM

## 2024-08-03 DIAGNOSIS — E1122 Type 2 diabetes mellitus with diabetic chronic kidney disease: Secondary | ICD-10-CM | POA: Diagnosis present

## 2024-08-03 DIAGNOSIS — B181 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: Secondary | ICD-10-CM | POA: Diagnosis present

## 2024-08-03 DIAGNOSIS — R1084 Generalized abdominal pain: Secondary | ICD-10-CM | POA: Diagnosis not present

## 2024-08-03 DIAGNOSIS — J449 Chronic obstructive pulmonary disease, unspecified: Secondary | ICD-10-CM | POA: Diagnosis present

## 2024-08-03 DIAGNOSIS — N179 Acute kidney failure, unspecified: Secondary | ICD-10-CM | POA: Diagnosis present

## 2024-08-03 DIAGNOSIS — N1330 Unspecified hydronephrosis: Secondary | ICD-10-CM

## 2024-08-03 DIAGNOSIS — F319 Bipolar disorder, unspecified: Secondary | ICD-10-CM | POA: Diagnosis present

## 2024-08-03 DIAGNOSIS — Z9152 Personal history of nonsuicidal self-harm: Secondary | ICD-10-CM

## 2024-08-03 DIAGNOSIS — N185 Chronic kidney disease, stage 5: Secondary | ICD-10-CM | POA: Diagnosis present

## 2024-08-03 DIAGNOSIS — G894 Chronic pain syndrome: Secondary | ICD-10-CM | POA: Diagnosis present

## 2024-08-03 DIAGNOSIS — N210 Calculus in bladder: Secondary | ICD-10-CM | POA: Diagnosis present

## 2024-08-03 DIAGNOSIS — E785 Hyperlipidemia, unspecified: Secondary | ICD-10-CM | POA: Diagnosis present

## 2024-08-03 DIAGNOSIS — F419 Anxiety disorder, unspecified: Secondary | ICD-10-CM | POA: Diagnosis present

## 2024-08-03 DIAGNOSIS — I959 Hypotension, unspecified: Secondary | ICD-10-CM | POA: Diagnosis not present

## 2024-08-03 DIAGNOSIS — Z743 Need for continuous supervision: Secondary | ICD-10-CM | POA: Diagnosis not present

## 2024-08-03 DIAGNOSIS — N136 Pyonephrosis: Principal | ICD-10-CM | POA: Diagnosis present

## 2024-08-03 DIAGNOSIS — Z886 Allergy status to analgesic agent status: Secondary | ICD-10-CM

## 2024-08-03 DIAGNOSIS — N320 Bladder-neck obstruction: Secondary | ICD-10-CM | POA: Diagnosis present

## 2024-08-03 LAB — URINALYSIS, ROUTINE W REFLEX MICROSCOPIC
Bilirubin Urine: NEGATIVE
Glucose, UA: NEGATIVE mg/dL
Ketones, ur: NEGATIVE mg/dL
Nitrite: POSITIVE — AB
Protein, ur: 100 mg/dL — AB
RBC / HPF: >50 RBC/hpf (ref 0–5)
Specific Gravity, Urine: 1.02 (ref 1.005–1.030)
WBC, UA: >50 WBC/hpf (ref 0–5)
pH: 6 (ref 5.0–8.0)

## 2024-08-03 MED ORDER — ACETAMINOPHEN 650 MG RE SUPP: 650.0000 mg | Freq: Four times a day (QID) | RECTAL | Status: DC | PRN

## 2024-08-03 MED ORDER — ACETAMINOPHEN 325 MG PO TABS
650.0000 mg | ORAL_TABLET | Freq: Four times a day (QID) | ORAL | Status: DC | PRN
  Administered 2024-08-04 (×2): 650 mg via ORAL
  Filled 2024-08-03: qty 2

## 2024-08-03 MED ORDER — ONDANSETRON HCL 4 MG PO TABS: 4.0000 mg | ORAL_TABLET | Freq: Four times a day (QID) | ORAL | Status: DC | PRN

## 2024-08-03 MED ORDER — LACTATED RINGERS IV SOLN: INTRAVENOUS | Status: DC

## 2024-08-03 MED ORDER — BISACODYL 5 MG PO TBEC: 5.0000 mg | DELAYED_RELEASE_TABLET | Freq: Every day | ORAL | Status: DC | PRN

## 2024-08-03 MED ORDER — ONDANSETRON HCL 4 MG/2ML IJ SOLN: 4.0000 mg | Freq: Four times a day (QID) | INTRAMUSCULAR | Status: DC | PRN

## 2024-08-03 MED ORDER — SENNOSIDES-DOCUSATE SODIUM 8.6-50 MG PO TABS: 1.0000 | ORAL_TABLET | Freq: Every evening | ORAL | Status: DC | PRN

## 2024-08-03 MED ORDER — ENOXAPARIN SODIUM 60 MG/0.6ML IJ SOSY: 60.0000 mg | PREFILLED_SYRINGE | INTRAMUSCULAR | Status: DC

## 2024-08-03 NOTE — H&P (Addendum)
 History and Physical  Hunter Reyes FMW:[id number] DOB: 10-Jun-1976 DOA: 08/03/2024  PCP: Hunter Norleen PEDLAR, MD   Chief Complaint: Urinary retention  HPI: Hunter Reyes is a 48 y.o. male with medical history significant for anxiety and depression, GERD, peripheral neuropathy, epidural abscess, bipolar 1 disorder and arthritis who was transferred from [name] for evaluation of urinary retention and bladder obstruction.  Patient reports for the last 2 days, he has not been able to void.  He has had some burning and pain with urination as well as lower abdominal discomfort.  He denies any fevers, chills, back pain, nausea, vomiting, chest pain or shortness of breath.  He reports a history of bedbugs with multiple old excoriations on skin. Foley catheter was placed at [facility] ED with improvement in abdominal discomfort.   Initial vitals on arrival show patient afebrile and normotensive. Hunter Reyes labs significant for creatinine 1.65, WBC 14.0, Hgb 7.5, lactic acid 1.6.UA on arrival shows moderate hemoglobinuria, moderate proteinuria, positive nitrite, large leuks, RBC >50, WBC >50 and many bacteria.   CT A/P from [facility] 1. Moderate right hydronephrosis and hydroureter. No ureteral stone identified. Obstruction may be due to bladder mass near the right UVJ, recently passed stone or non-radioopaque material in the distal right ureter. 2. Small faintly calcified bladder stones. 3. Thickened urinary bladder wall, asymmetric to the right, recommend cystoscopy to further evaluate particularly if there is unexplained hematuria   Review of Systems: Please see HPI for pertinent positives and negatives. A complete 10 system review of systems are otherwise negative.  Past Medical History:  Diagnosis Date   Arthritis    hands   Bipolar 1 disorder (HCC)    Borderline personality disorder (HCC)    COPD (chronic obstructive pulmonary disease) (HCC)    denies SOB with daily activities    DDD (degenerative disc disease), lumbar    and thoracic   Deliberate self-cutting    history of   Depression    Foot ulcer, right (HCC) 11/02/2013   GERD (gastroesophageal reflux disease)    History of seizures    as a child - no seizures since before teenage years; states unknown cause   Neuropathic ulcer (HCC)    Neuropathy    Non-restorable tooth 10/2013   multiple   Obesity    Osteomyelitis (HCC) 2014   right foot   Panic attacks    Peripheral neuropathy    Past Surgical History:  Procedure Laterality Date   AMPUTATION     left middle toe   BREAST BIOPSY Right 07/12/2017   Procedure: BREAST BIOPSY;  Surgeon: Mavis Anes, MD;  Location: [facility] ORS;  Service: General;  Laterality: Right;   DECOMPRESSIVE LUMBAR LAMINECTOMY LEVEL 1 N/A 12/05/2022   Procedure: DECOMPRESSIVE LUMBAR LAMINECTOMY LUMBAR FOUR;  Surgeon: Lanis Pupa, MD;  Location: [facility] OR;  Service: Neurosurgery;  Laterality: N/A;   I & D EXTREMITY N/A 12/09/2022   Procedure: IRRIGATION AND DEBRIDEMENT OF FOOT;  Surgeon: Harden Jerona GAILS, MD;  Location: [facility] OR;  Service: Orthopedics;  Laterality: N/A;   INNER EAR SURGERY Left    as a child   IR FLUORO GUIDE CV LINE RIGHT  12/18/2022   IR US  GUIDE VASC ACCESS RIGHT  12/18/2022   MULTIPLE EXTRACTIONS WITH ALVEOLOPLASTY N/A 11/17/2013   Procedure: MULTIPLE EXTRACTION WITH ALVEOLOPLASTY;  Surgeon: Glendia CHRISTELLA Primrose, DDS;  Location: [facility] OR;  Service: Oral Surgery;  Laterality: N/A;   Social History:  reports that he has been smoking cigarettes.  He has a 26 pack-year smoking history. His smokeless tobacco use includes snuff. He reports that he does not currently use alcohol. He reports current drug use. Drugs: Cocaine and Marijuana.  Allergies  Allergen Reactions   Darvocet [Propoxyphene N-Acetaminophen ] Hives and Itching   Tramadol Hives and Itching   Other Hives    UrologyHistory reviewed. No pertinent family history.   Prior to Admission medications   Medication  Sig Start Date End Date Taking? Authorizing Provider  acetaminophen  (TYLENOL ) 325 MG tablet Take 2 tablets (650 mg total) by mouth every 4 (four) hours as needed for mild pain or moderate pain. 02/02/23   Raenelle Coria, MD  doxycycline  (VIBRA -TABS) 100 MG tablet Take 1 tablet (100 mg total) by mouth 2 (two) times daily. 02/01/23   Raenelle Coria, MD  ferrous sulfate  325 (65 FE) MG tablet Take 1 tablet (325 mg total) by mouth daily with breakfast. 02/02/23 03/04/23  Raenelle Coria, MD  ibuprofen  (ADVIL ,MOTRIN ) 600 MG tablet Take 1 tablet (600 mg total) by mouth 4 (four) times daily. Patient taking differently: Take 600 mg by mouth every 8 (eight) hours as needed for mild pain. 06/27/17   Armida Culver, PA-C  polyethylene glycol powder (GLYCOLAX /MIRALAX ) 17 GM/SCOOP powder Take 17 g by mouth daily. 02/02/23   Raenelle Coria, MD  senna-docusate (SENOKOT-S) 8.6-50 MG tablet Take 1 tablet by mouth 2 (two) times daily. 02/01/23   Ghimire, Kuber, MD  sertraline  (ZOLOFT ) 50 MG tablet Take 0.5 tablets (25 mg total) by mouth daily. 02/01/23 03/03/23  Raenelle Coria, MD  tiZANidine  (ZANAFLEX ) 4 MG tablet Take 1 tablet (4 mg total) by mouth every 6 (six) hours as needed for muscle spasms. 02/01/23   Ghimire, Kuber, MD  traZODone  (DESYREL ) 50 MG tablet Take 0.5 tablets (25 mg total) by mouth at bedtime as needed for up to 14 days for sleep. 02/01/23 02/15/23  Raenelle Coria, MD    Physical Exam: BP 130/78 (BP Location: Right Arm)   Pulse 78   Temp 98.6 F (37 C) (Oral)   Resp 18   SpO2 100%  General: Pleasant, well-appearing obese middle-age man laying in bed. No acute distress. HEENT: [place]/AT. Anicteric sclera CV: RRR. No murmurs, rubs, or gallops. No LE edema Pulmonary: Lungs CTAB. Normal effort. No wheezing or rales. Abdominal: Soft, nontender, nondistended. Normal bowel sounds. Extremities: Palpable radial and DP pulses. Normal ROM. Skin: Warm and dry. Multiple bedbug bites on both arms, thighs and legs GU: Foley  catheter stable in place  with light yellow urine in bag Neuro: A&Ox3. Moves all extremities. Normal sensation to light touch. No focal deficit. Psych: Normal mood and affect          Labs on Admission:  Basic Metabolic Panel: No results for input(s): NA, K, CL, CO2, GLUCOSE, BUN, CREATININE, CALCIUM , MG, PHOS in the last 168 hours. Liver Function Tests: No results for input(s): AST, ALT, ALKPHOS, BILITOT, PROT, ALBUMIN in the last 168 hours. No results for input(s): LIPASE, AMYLASE in the last 168 hours. No results for input(s): AMMONIA in the last 168 hours. CBC: No results for input(s): WBC, NEUTROABS, HGB, HCT, MCV, PLT in the last 168 hours. Cardiac Enzymes: No results for input(s): CKTOTAL, CKMB, CKMBINDEX, TROPONINI in the last 168 hours. BNP (last 3 results) No results for input(s): BNP in the last 8760 hours.  ProBNP (last 3 results) No results for input(s): PROBNP in the last 8760 hours.  CBG: No results for input(s): GLUCAP in the last 168 hours.  Radiological  Exams on Admission: No results found. Assessment/Plan SOHAN POTVIN is a 48 y.o. male with medical history significant for anxiety and depression, GERD, peripheral neuropathy, epidural abscess, bipolar 1 disorder and arthritis who was transferred from [facility] for evaluation of urinary retention and bladder obstruction.    # Acute urinary retention # Bladder obstruction # Hydronephrosis - Patient initially presented to [facility] due to 2 days of urinary retention - OSH CT A/P shows moderate right hydronephrosis and hydroureter - Urology initially consulted, recommended foley placement, and outpatient follow-up however OSH EDP felt patient required inpatient management - Patient admitted for observation - Discussed case via [place] with on-call urologist, Dr. Shane, recommends repeat imaging and labs with plan for urology consult in  a.m. - Continue Foley care  # Acute cystitis - Pt presented with acute urinary retention with some burning and pain with urination - Urinalysis shows signs of infection - S/p IM Rocephin  and IV cefepime  at OSH - Start IV rocephin  - F/u urine culture - Trend CBC and fever curve  # AKI - BMP from OSH showed creatinine of 1.65 - Likely in the setting of acute urinary retention and bladder obstruction - S/p Foley placement - Start IV LR 100 cc/h for 10 hrs - Trend renal function and avoid nephrotoxic agents  # Microcytic anemia - CBC from OSH shows Hgb of 7.5 with MCV of 69 - Check iron  studies, ferritin and vitamin B12 - Trend CBC and transfuse for hgb < 7  # Anxiety and depression # Bipolar 1 disorder - Patient reports he does not take any medications  DVT prophylaxis: SCDs    Code Status: Full Code  Consults called: Urology  Family Communication: No family at bedside  Severity of Illness: The appropriate patient status for this patient is OBSERVATION. Observation status is judged to be reasonable and necessary in order to provide the required intensity of service to ensure the patient's safety. The patient's presenting symptoms, physical exam findings, and initial radiographic and laboratory data in the context of their medical condition is felt to place them at decreased risk for further clinical deterioration. Furthermore, it is anticipated that the patient will be medically stable for discharge from the hospital within 2 midnights of admission.   Level of care: Telemetry   This record has been created using Conservation officer, historic buildings. Errors have been sought and corrected, but may not always be located. Such creation errors do not reflect on the standard of care.   Lou Claretta HERO, MD 08/04/2024, 1:52 AM [facility] Pager: [phone] Isaiah 41:10   If 7PM-7AM, please contact night-coverage [email or web address] Password [email or web address]

## 2024-08-03 NOTE — ED Provider Notes (Addendum)
 Emergency Department Provider Note    ED Clinical Impression   Final diagnoses:  Acute cystitis with hematuria (Primary)  Bladder outlet obstruction  Hydronephrosis of right kidney  Anemia, unspecified type    ED Assessment/Plan    History   Chief Complaint  Patient presents with  . Abdominal Pain  . Groin Pain   This 48 year old male with a history of Hepatitis B osteomyelitis peripheral neuropathy bipolar 1 disorder polysubstance abuse complains of left lower quadrant pain and been unable to urinate effectively over the past 36 hours.  He also complains of foul-smelling urine.    Past Medical History[1]  Past Surgical History[2]  Family History[3]  Social History[4]  Review of Systems  Constitutional:  Negative for fever.  Cardiovascular:  Negative for chest pain.  Gastrointestinal:  Negative for nausea and vomiting.  Genitourinary:  Positive for penile pain.    Physical Exam   BP 98/67   Pulse 91   Temp 36.7 C (98.1 F) (Temporal)   Resp 16   Ht 188 cm (6' 2)   SpO2 97%   BMI 35.31 kg/m   Physical Exam Vitals and nursing note reviewed.  Constitutional:      General: He is in acute distress (Moderate).  HENT:     Head: Normocephalic.  Cardiovascular:     Rate and Rhythm: Normal rate and regular rhythm.  Pulmonary:     Effort: Pulmonary effort is normal.     Breath sounds: Normal breath sounds.  Abdominal:     Palpations: Abdomen is soft.     Tenderness: There is abdominal tenderness (Suprapubic and left lower quadrant). There is no guarding or rebound.  Genitourinary:    Rectum: Guaiac result negative.  Skin:    General: Skin is warm and dry.  Neurological:     General: No focal deficit present.     Mental Status: He is alert and oriented to person, place, and time.     ED Course       Medical Decision Making The patient presents with decreased urination and by history, urinary outlet obstruction; the concern is for outlet  obstruction UTI cystitis prostatitis BPH; less likely pyelonephritis  Review of the chart shows the patient has had urinary obstructions in the past.  2 days ago he was admitted with acute renal failure requiring hemodialysis and had bilateral hydronephrosis.  The patient's white blood count was elevated 14,000 and his hemoglobin decreased to 7.5 from a previous 9.  The patient's stool guaiac was negative.  Suspect the anemia is of chronic disease.  Urinalysis consistent with a severe UTI antibiotics were given for his urinary tract infection.  Lactic acid did not indicate sepsis 1.6.  Electrolytes showed an improved creatinine compared to most previous 1.65  1615: Urology, for [facility] was consulted and recommended Foley. 1640: Spoke to Dr. Fleeta who will accept the patient at [facility], Lyndy Beagle, MD 08/03/24 1710       [1] Past Medical History: Diagnosis Date  . Hepatitis B   . Osteomyelitis      [2] No past surgical history on file. [3] No family history on file. [4] Social History Socioeconomic History  . Marital status: Single  Tobacco Use  . Smoking status: Every Day    Current packs/day: 1.00    Types: Cigarettes  . Smokeless tobacco: Never  Substance and Sexual Activity  . Alcohol use: Not Currently  .  Drug use: Yes    Types: Marijuana   Social Drivers of Health   Food Insecurity: Food Insecurity Present (12/04/2022)   Received from [facility]   Hunger Vital Sign   . Within the past 12 months, you worried that your food would run out before you got the money to buy more.: Often true   . Within the past 12 months, the food you bought just didn't last and you didn't have money to get more.: Often true  Transportation Needs: Unmet Transportation Needs (12/04/2022)   Received from [facility] - Transportation   . Lack of Transportation (Medical): Yes   . Lack of Transportation (Non-Medical): Yes   Rosamond Lyndy Beagle, MD 08/03/24 [phone]

## 2024-08-03 NOTE — ED Triage Notes (Signed)
 Patient comes in via EMS. Complains of abdominal and groin pain, with painful orientation for 2 days.

## 2024-08-03 NOTE — ED Notes (Signed)
 Round check completed at this time. Pt is asleep, no s/s of distress.

## 2024-08-03 NOTE — ED Notes (Signed)
 Pt accepted going to [facility]: [address] Report #: [phone]  Accepting: Dr. Harlene Bowl.

## 2024-08-03 NOTE — ED Notes (Signed)
 Physician Order and Certification Statement for Non-Emergency Ambulance Services  SECTION I - GENERAL INFORMATION AND PHYSICIAN ORDER  Origin: [facility] Room: 13[address] [place] [address] Phone: [phone]  Destination: [facility]  Payer is not Medicare   SECTION II - MEDICAL NECESSITY QUESTIONNAIRE  Ambulance Transportation is medically necessary only if other means of transport are contraindicated or would be potentially harmful to the patient. To meet this requirement, the patient must be either bed confined or suffer from a condition such that transport by means other than ambulance is contraindicated by the patient's condition. The following questions must be answered by the medical professional signing below for this form to be valid: Describe the MEDICAL CONDITION (physical and/or mental) of this patient AT THE TIME OF AMBULANCE TRANSPORT that requires the patient to be transported in an ambulance and why transport by other means is contraindicated by the patient's condition:   IV maintenance, foley catheter maintenance  Is this patient bed confined as defined below?  No        To be bed confined the patient must satisfy all three of the following conditions: (1) unable to get up from bed without Assistance; AND (2) unable to ambulate; AND (3) unable to sit in a chair or wheelchair.  Can this patient safely be transported by car or wheelchair van (i.e., seated during transport, without a medical attendant or monitoring?)  No    In addition to completing questions 1-3 above, please check any of the following conditions that apply*: (*Note: supporting documentation for any boxes checked must be maintained in the patient's medical record.)  IV meds/fluids required and foley catheter maintenance  SECTION III - SIGNATURE OF PHYSICIAN OR  HEALTHCARE PROFESSIONAL  I certify that the above information is true and correct based on my current valuation of this patient, and  represent that the patient requires transport by ambulance and that other form of transport are contraindicated. I understand that this information will be used by the Centers for Medicare and Medicaid Services (CMS) to support the determination of medical necessity for ambulance services, and I represent that I have personal knowledge of the patient's condition at the time of transport.  Patient is able to sign documents.  Signature of Healthcare Professional* - Registered Nurse: Myla LITTIE Barefoot, RN 08/03/2024 6:36 PM Electronically Signed  Myla LITTIE Barefoot, RN Printed Name and Credentials of Physician or Healthcare Professional (MD, DO, RN, etc.) *Form must be signed only by patient's attending physician for scheduled, repetitive transports. For non-repetitive, unscheduled ambulance transports, if unable to obtain the signature of the attending physician, any of the following may sign (please check appropriate box below. A physician order is required if form is signed by the registered nurse or discharge planner.  Note:  User bears all responsibility for compliance with all applicable laws and regulations.

## 2024-08-03 NOTE — ED Notes (Signed)
 Pt has been eating and drink several times this shift.  Did not want me to inform family about him transferring.

## 2024-08-04 ENCOUNTER — Telehealth: Payer: Self-pay | Admitting: Internal Medicine

## 2024-08-04 ENCOUNTER — Observation Stay (HOSPITAL_COMMUNITY)

## 2024-08-04 ENCOUNTER — Telehealth (HOSPITAL_COMMUNITY): Payer: Self-pay | Admitting: Pharmacy Technician

## 2024-08-04 DIAGNOSIS — F17210 Nicotine dependence, cigarettes, uncomplicated: Secondary | ICD-10-CM | POA: Diagnosis present

## 2024-08-04 DIAGNOSIS — R338 Other retention of urine: Secondary | ICD-10-CM | POA: Diagnosis not present

## 2024-08-04 DIAGNOSIS — E1122 Type 2 diabetes mellitus with diabetic chronic kidney disease: Secondary | ICD-10-CM | POA: Diagnosis present

## 2024-08-04 DIAGNOSIS — E1140 Type 2 diabetes mellitus with diabetic neuropathy, unspecified: Secondary | ICD-10-CM | POA: Diagnosis present

## 2024-08-04 DIAGNOSIS — F603 Borderline personality disorder: Secondary | ICD-10-CM | POA: Diagnosis present

## 2024-08-04 DIAGNOSIS — F419 Anxiety disorder, unspecified: Secondary | ICD-10-CM | POA: Diagnosis present

## 2024-08-04 DIAGNOSIS — N185 Chronic kidney disease, stage 5: Secondary | ICD-10-CM | POA: Diagnosis present

## 2024-08-04 DIAGNOSIS — B181 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: Secondary | ICD-10-CM | POA: Diagnosis present

## 2024-08-04 DIAGNOSIS — D509 Iron deficiency anemia, unspecified: Secondary | ICD-10-CM | POA: Diagnosis present

## 2024-08-04 DIAGNOSIS — Z9152 Personal history of nonsuicidal self-harm: Secondary | ICD-10-CM | POA: Diagnosis not present

## 2024-08-04 DIAGNOSIS — F319 Bipolar disorder, unspecified: Secondary | ICD-10-CM | POA: Diagnosis present

## 2024-08-04 DIAGNOSIS — N136 Pyonephrosis: Secondary | ICD-10-CM | POA: Diagnosis present

## 2024-08-04 DIAGNOSIS — E785 Hyperlipidemia, unspecified: Secondary | ICD-10-CM | POA: Diagnosis present

## 2024-08-04 DIAGNOSIS — N320 Bladder-neck obstruction: Secondary | ICD-10-CM | POA: Diagnosis present

## 2024-08-04 DIAGNOSIS — N210 Calculus in bladder: Secondary | ICD-10-CM | POA: Diagnosis present

## 2024-08-04 DIAGNOSIS — Z8661 Personal history of infections of the central nervous system: Secondary | ICD-10-CM | POA: Diagnosis not present

## 2024-08-04 DIAGNOSIS — N179 Acute kidney failure, unspecified: Secondary | ICD-10-CM | POA: Diagnosis present

## 2024-08-04 DIAGNOSIS — Z885 Allergy status to narcotic agent status: Secondary | ICD-10-CM | POA: Diagnosis not present

## 2024-08-04 DIAGNOSIS — Z6835 Body mass index (BMI) 35.0-35.9, adult: Secondary | ICD-10-CM | POA: Diagnosis not present

## 2024-08-04 DIAGNOSIS — E66812 Obesity, class 2: Secondary | ICD-10-CM | POA: Diagnosis present

## 2024-08-04 DIAGNOSIS — J449 Chronic obstructive pulmonary disease, unspecified: Secondary | ICD-10-CM | POA: Diagnosis present

## 2024-08-04 DIAGNOSIS — G894 Chronic pain syndrome: Secondary | ICD-10-CM | POA: Diagnosis present

## 2024-08-04 DIAGNOSIS — N3001 Acute cystitis with hematuria: Secondary | ICD-10-CM

## 2024-08-04 DIAGNOSIS — I120 Hypertensive chronic kidney disease with stage 5 chronic kidney disease or end stage renal disease: Secondary | ICD-10-CM | POA: Diagnosis present

## 2024-08-04 DIAGNOSIS — Z886 Allergy status to analgesic agent status: Secondary | ICD-10-CM | POA: Diagnosis not present

## 2024-08-04 DIAGNOSIS — G834 Cauda equina syndrome: Secondary | ICD-10-CM | POA: Diagnosis present

## 2024-08-04 DIAGNOSIS — N1330 Unspecified hydronephrosis: Secondary | ICD-10-CM

## 2024-08-04 LAB — COMPREHENSIVE METABOLIC PANEL WITH GFR
ALT: 8 U/L (ref 0–44)
AST: 14 U/L — ABNORMAL LOW (ref 15–41)
Albumin: 2.9 g/dL — ABNORMAL LOW (ref 3.5–5.0)
Alkaline Phosphatase: 71 U/L (ref 38–126)
Anion gap: 8 (ref 5–15)
BUN: 17 mg/dL (ref 6–20)
CO2: 22 mmol/L (ref 22–32)
Calcium: 8.2 mg/dL — ABNORMAL LOW (ref 8.9–10.3)
Chloride: 110 mmol/L (ref 98–111)
Creatinine, Ser: 1.63 mg/dL — ABNORMAL HIGH (ref 0.61–1.24)
GFR, Estimated: 52 mL/min — ABNORMAL LOW (ref >60)
Glucose, Bld: 88 mg/dL (ref 70–99)
Potassium: 3.6 mmol/L (ref 3.5–5.1)
Sodium: 140 mmol/L (ref 135–145)
Total Bilirubin: 0.4 mg/dL (ref 0.0–1.2)
Total Protein: 7.7 g/dL (ref 6.5–8.1)

## 2024-08-04 LAB — CBC
HCT: 25.2 % — ABNORMAL LOW (ref 39.0–52.0)
Hemoglobin: 7 g/dL — ABNORMAL LOW (ref 13.0–17.0)
MCH: 20.2 pg — ABNORMAL LOW (ref 26.0–34.0)
MCHC: 27.8 g/dL — ABNORMAL LOW (ref 30.0–36.0)
MCV: 72.8 fL — ABNORMAL LOW (ref 80.0–100.0)
Platelets: 363 K/uL (ref 150–400)
RBC: 3.46 MIL/uL — ABNORMAL LOW (ref 4.22–5.81)
RDW: 16.8 % — ABNORMAL HIGH (ref 11.5–15.5)
WBC: 11.3 K/uL — ABNORMAL HIGH (ref 4.0–10.5)
nRBC: 0 % (ref 0.0–0.2)

## 2024-08-04 LAB — IRON AND TIBC
Iron: 15 ug/dL — ABNORMAL LOW (ref 45–182)
Saturation Ratios: 3 % — ABNORMAL LOW (ref 17.9–39.5)
TIBC: 484 ug/dL — ABNORMAL HIGH (ref 250–450)
UIBC: 469 ug/dL

## 2024-08-04 LAB — HIV ANTIBODY (ROUTINE TESTING W REFLEX): HIV Screen 4th Generation wRfx: NONREACTIVE

## 2024-08-04 LAB — VITAMIN B12: Vitamin B-12: 133 pg/mL — ABNORMAL LOW (ref 180–914)

## 2024-08-04 LAB — FERRITIN: Ferritin: 6 ng/mL — ABNORMAL LOW (ref 24–336)

## 2024-08-04 MED ORDER — CYANOCOBALAMIN 1000 MCG/ML IJ SOLN
1000.0000 ug | Freq: Once | INTRAMUSCULAR | Status: AC
  Administered 2024-08-04 (×2): 1000 ug via INTRAMUSCULAR
  Filled 2024-08-04: qty 1

## 2024-08-04 MED ORDER — IRON SUCROSE 200 MG IVPB - SIMPLE MED
200.0000 mg | Freq: Once | Status: AC
  Administered 2024-08-04 (×2): 200 mg via INTRAVENOUS
  Filled 2024-08-04: qty 200

## 2024-08-04 MED ORDER — CHLORHEXIDINE GLUCONATE CLOTH 2 % EX PADS
6.0000 | MEDICATED_PAD | Freq: Every day | CUTANEOUS | Status: DC
  Administered 2024-08-04 – 2024-08-05 (×4): 6 via TOPICAL

## 2024-08-04 MED ORDER — FERROUS SULFATE 325 (65 FE) MG PO TABS
325.0000 mg | ORAL_TABLET | Freq: Every day | ORAL | Status: DC
  Administered 2024-08-05 (×2): 325 mg via ORAL
  Filled 2024-08-04: qty 1

## 2024-08-04 MED ORDER — LACTATED RINGERS IV SOLN: INTRAVENOUS | Status: AC

## 2024-08-04 MED ORDER — SODIUM CHLORIDE 0.9 % IV SOLN
1.0000 g | INTRAVENOUS | Status: DC
  Administered 2024-08-04 – 2024-08-05 (×4): 1 g via INTRAVENOUS
  Filled 2024-08-04 (×2): qty 10

## 2024-08-04 MED ORDER — VITAMIN B-12 1000 MCG PO TABS
1000.0000 ug | ORAL_TABLET | Freq: Every day | ORAL | Status: DC
  Administered 2024-08-04 – 2024-08-05 (×4): 1000 ug via ORAL
  Filled 2024-08-04 (×2): qty 1

## 2024-08-04 MED ORDER — TAMSULOSIN HCL 0.4 MG PO CAPS
0.4000 mg | ORAL_CAPSULE | Freq: Every day | ORAL | Status: DC
  Administered 2024-08-04 – 2024-08-05 (×4): 0.4 mg via ORAL
  Filled 2024-08-04 (×2): qty 1

## 2024-08-04 NOTE — Plan of Care (Signed)
  Problem: Health Behavior/Discharge Planning: Goal: Ability to manage health-related needs will improve Outcome: Progressing   Problem: Elimination: Goal: Will not experience complications related to urinary retention Outcome: Progressing   Problem: Pain Managment: Goal: General experience of comfort will improve and/or be controlled Outcome: Progressing   Problem: Skin Integrity: Goal: Risk for impaired skin integrity will decrease Outcome: Progressing

## 2024-08-04 NOTE — Telephone Encounter (Signed)
 Patient referred to infusion pharmacy team for ambulatory infusion of IV iron .  Insurance - [place] [employer]   Site of care - Site of care: [facility] Dx code - D50.9 IV Iron  Therapy - feraheme 510 mg iv x 2  Infusion appointments - Scheduling team will schedule patient as soon as possible.   Hunter Reyes D. Nuria Phebus, PharmD

## 2024-08-04 NOTE — Plan of Care (Signed)

## 2024-08-04 NOTE — Progress Notes (Signed)
   08/04/24 0924  TOC Brief Assessment  Insurance and Status Reviewed  Patient has primary care physician Yes  Home environment has been reviewed single family home  Prior level of function: independent  Prior/Current Home Services No current home services  Social Drivers of Health Review SDOH reviewed no interventions necessary  Readmission risk has been reviewed Yes  Transition of care needs no transition of care needs at this time    Signed: Heather Saltness, MSW, LCSW Clinical Social Worker Inpatient Care Management 08/04/2024 9:25 AM

## 2024-08-04 NOTE — Progress Notes (Signed)
 PROGRESS NOTE  Hunter Reyes  DOB: [birth year]  PCP: Hunter Norleen PEDLAR, MD FMW:[id number]  DOA: 08/03/2024  LOS: 0 days  Hospital Day: 2  Brief narrative: Hunter Reyes is a 48 y.o. male with PMH significant for obesity, DM2, HTN, HLD, GERD, neuropathy, bipolar disorder, depression, arthritis, h/o bedbugs with multiple old excoriations and skin, h/o polysubstance abuse, COPD, chronic hep B, right foot osteomyelitis s/p distal foot amputation. In December 2023, patient was hospitalized at [facility] for epidural abscess at L4 level with subacute cauda equina syndrome.  He had urinary retention at the time.  8/11, patient presented to ED at [facility] with complaint of inability to void for 2 days leading to lower abdominal discomfort.  No fever, some burning and pain with urination for few days prior.  Initial labs showed WBC count 14, hemoglobin 7.5, lactic acid 1.6, creatinine 1.65 Urinalysis showed moderate hemoglobinuria, moderate proteinuria, positive nitrate, large leukocyte and many bacteria CT abdomen/pelvis showed -Moderate right hydronephrosis and hydroureter.  No ureteral stone, obstruction may be due to bladder mass near the right UVJ, recently passed stone or non-radiopaque material in the distal right ureter. -Small faintly calcified bladder stones -Thickened urinary bladder wall, asymmetric to the right  Foley catheter was inserted Directly admitted to TRH at [facility] Urology consulted  Subjective: Patient was seen and examined this morning. Middle-aged Caucasian male.  Lying on bed.  Not in distress.  No family at bedside.  Has Foley catheter with clear urine. Chart reviewed. Remains afebrile, hemodynamically stable Most recent labs from this morning with WBC count 11.3, hemoglobin low at 7, MCV 72, vitamin B12 at 133, ferritin low at 6, creatinine at 1.65  Assessment and plan: Acute on chronic urinary retention Moderate right hydronephrosis Presented with  urinary retention.  Imaging as above showing moderate right hydronephrosis and hydroureter, bladder wall thickening asymmetric to the right, raising suspicion of non-radiopaque stone versus mass.   Foley catheter inserted Urology consult appreciated.   Given his history of epidural abscess, urology suspects neurogenic bladder.  Recommended Flomax  To keep catheter in place and plan for outpatient voiding trial and urodynamic study Follow-up with urology as an outpatient.   Acute cystitis Urinalysis showed moderate hemoglobinuria, moderate proteinuria, positive nitrate, large leukocyte and many bacteria Not septic. Currently on IV Rocephin  Follow-up urine culture report Recent Labs  Lab 08/04/24 0443  WBC 11.3*   AKI on CKD 5 It seems patient has chronically elevated baseline creatinine over 3  Presented with creatinine of 2.22 which is well within the range.  Better at 1.63 today with decompression..   Continue to monitor.  Recent Labs    12/19/23 1349 08/04/24 0443  BUN 27* 17  CREATININE 2.22* 1.63*  CO2 21* 22   Chronic microcytic anemia Severe iron  deficiency Severe vitamin B-12 deficiency Baseline hemoglobin between 7 and 8 Anemia panel showed severely low ferritin at 6 and severe low vitamin B12 at 133.   I ordered for 1 dose of IV Venofer  and 1 dose of IM vitamin B12.  Also started on oral iron  and oral vitamin B12 supplement Recent Labs    12/19/23 1349 08/04/24 0443  HGB 12.6* 7.0*  MCV 85.9 72.8*  VITAMINB12  --  133*  FERRITIN  --  6*  TIBC  --  484*  IRON   --  15*   Anxiety and depression Bipolar 1 disorder Patient reports he does not take any medications  Class 2 obesity  Body mass index is  35.3 kg/m. Patient has been advised to make an attempt to improve diet and exercise patterns to aid in weight loss.     Mobility: Encourage ambulation  Goals of care   Code Status: Full Code     DVT prophylaxis:  Place and maintain sequential compression  device Start: 08/04/24 0203   Antimicrobials: IV Rocephin  Fluid: None Consultants: Urology Family Communication: None at bedside  Status: Observation Level of care:  Telemetry   Patient is from: Home Needs to continue in-hospital care: Needs IV antibiotics, pending urine culture report Anticipated d/c to: Pending clinical course    Diet:  Diet Order             Diet regular Room service appropriate? Yes; Fluid consistency: Thin  Diet effective now                   Scheduled Meds:  Chlorhexidine  Gluconate Cloth  6 each Topical Daily   cyanocobalamin   1,000 mcg Intramuscular Once   vitamin B-12  1,000 mcg Oral Daily   [START ON 08/05/2024] ferrous sulfate   325 mg Oral Q breakfast   tamsulosin   0.4 mg Oral Daily    PRN meds: acetaminophen  **OR** acetaminophen , bisacodyl , ondansetron  **OR** ondansetron  (ZOFRAN ) IV, senna-docusate   Infusions:   cefTRIAXone  (ROCEPHIN )  IV 1 g (08/04/24 1048)   iron  sucrose     lactated ringers       Antimicrobials: Anti-infectives (From admission, onward)    Start     Dose/Rate Route Frequency Ordered Stop   08/04/24 1000  cefTRIAXone  (ROCEPHIN ) 1 g in sodium chloride  0.9 % 100 mL IVPB        1 g 200 mL/hr over 30 Minutes Intravenous Every 24 hours 08/04/24 0200         Objective: Vitals:   08/04/24 0824 08/04/24 1228  BP: (!) 127/58 127/66  Pulse: 84 69  Resp:    Temp: 98.3 F (36.8 C) 98.9 F (37.2 C)  SpO2: 99% 100%    Intake/Output Summary (Last 24 hours) at 08/04/2024 1415 Last data filed at 08/04/2024 0856 Gross per 24 hour  Intake 1085.65 ml  Output 500 ml  Net 585.65 ml   Filed Weights   08/04/24 1151  Weight: 124.7 kg   Weight change:  Body mass index is 35.3 kg/m.   Physical Exam: General exam: Pleasant, middle-aged Caucasian male.  Obese Skin: No rashes, lesions or ulcers. HEENT: Atraumatic, normocephalic, no obvious bleeding Lungs: Clear to auscultation bilaterally,  CVS: S1, S2, no murmur,    GI/Abd: Soft, nontender, nondistended, bowel sound present,   CNS: Alert, awake, and x 3 Psychiatry: Mood appropriate Extremities: No pedal edema, no calf tenderness,   Data Review: I have personally reviewed the laboratory data and studies available.  F/u labs ordered Unresulted Labs (From admission, onward)     Start     Ordered   08/03/24 2122  Culture, Urine (Do not remove urinary catheter, catheter placed by urology or difficult to place)  (Urine Culture)  Once,   R       Question:  Indication  Answer:  Dysuria   08/03/24 2122            Signed, Chapman Rota, MD [facility] 08/04/2024

## 2024-08-04 NOTE — Telephone Encounter (Signed)
 Auth Submission: NO AUTH NEEDED Site of care: [facility] Payer: [place] [employer] Medication & CPT/J Code(s) submitted: Feraheme (ferumoxytol) [id number] Diagnosis Code:  Route of submission (phone, fax, portal):  Phone # Fax # Auth type: Buy/Bill HB Units/visits requested: 510mg  x 2 doses Reference number:  Approval from: 08/04/24 to 12/23/24    Dagoberto Armour, CPhT [facility] Phone: [phone] 08/04/2024

## 2024-08-04 NOTE — Consult Note (Addendum)
 Urology Consult Note   Requesting Attending Physician:  Arlice Reichert, MD Service Providing Consult: Urology  Consulting Attending: Dr. Shane   Reason for Consult:  urinary retention  HPI: Hunter Reyes is seen in consultation for reasons noted above at the request of Dahal, Reichert, MD. Patient is a 48 year old male with PMH significant for bipolar disorder, polysubstance abuse, COPD, chronic hep B, peripheral neuropathy, chronic pain syndrome, osteomyelitis of the right foot, and urinary retention x 2 years.  December 2023 patient presented to [facility] for acute urinary retention, ultimately resulting in hospitalization for a few months, spanning from mid December 2023 through February 2024. During which time he was treated for an L4 level epidural abscess with subacute symptoms of cauda equina syndrome.  Yesterday patient presented via EMS to [facility] with complaint of abdominal and groin pain w/ painful urination x2d.  Foley catheter was placed successfully and urology indicated outpatient follow-up.  For reasons unclear he was transferred to [facility] for urologic care.  On my arrival patient was alert, oriented, in no distress.  He appears to be an accurate historian and states that he has had his urinary retention for the last 2 years.  He has never seen a urologist to receive treatment per his report.  He has a mild acute kidney injury and urinalysis suggestive of UTI. ------------------  Assessment:   48 y.o. 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  Foley placed in [facility] emergency department   Recommendations: # Acute on chronic urinary retention  While I think this is related to a neurogenic bladder, will recommend starting patient on Flomax  and continuing through discharge.  Keep Foley catheter in place with outpatient voiding trial and urodynamic studies.  Urology will  follow peripherally.  Can discharge once medically clear.  Outpatient referral is pended in discharge orders.  Discharging physician to take over when needed.  Case and plan discussed with Dr. Shane  Past Medical History: Past Medical History:  Diagnosis Date   Arthritis    hands   Bipolar 1 disorder (HCC)    Borderline personality disorder (HCC)    COPD (chronic obstructive pulmonary disease) (HCC)    denies SOB with daily activities   DDD (degenerative disc disease), lumbar    and thoracic   Deliberate self-cutting    history of   Depression    Foot ulcer, right (HCC) 11/02/2013   GERD (gastroesophageal reflux disease)    History of seizures    as a child - no seizures since before teenage years; states unknown cause   Neuropathic ulcer (HCC)    Neuropathy    Non-restorable tooth 10/2013   multiple   Obesity    Osteomyelitis (HCC) 2014   right foot   Panic attacks    Peripheral neuropathy     Past Surgical History:  Past Surgical History:  Procedure Laterality Date   AMPUTATION     left middle toe   BREAST BIOPSY Right 07/12/2017   Procedure: BREAST BIOPSY;  Surgeon: Mavis Anes, MD;  Location: [facility] ORS;  Service: General;  Laterality: Right;   DECOMPRESSIVE LUMBAR LAMINECTOMY LEVEL 1 N/A 12/05/2022   Procedure: DECOMPRESSIVE LUMBAR LAMINECTOMY LUMBAR FOUR;  Surgeon: Lanis Pupa, MD;  Location: [facility] OR;  Service: Neurosurgery;  Laterality: N/A;   I & D EXTREMITY N/A 12/09/2022   Procedure: IRRIGATION AND DEBRIDEMENT OF FOOT;  Surgeon: Harden Jerona GAILS, MD;  Location: [facility] OR;  Service: Orthopedics;  Laterality: N/A;   INNER EAR SURGERY Left    as a child   IR FLUORO GUIDE CV LINE RIGHT  12/18/2022   IR US  GUIDE VASC ACCESS RIGHT  12/18/2022   MULTIPLE EXTRACTIONS WITH ALVEOLOPLASTY N/A 11/17/2013   Procedure: MULTIPLE EXTRACTION WITH ALVEOLOPLASTY;  Surgeon: Glendia CHRISTELLA Primrose, DDS;  Location: [facility] OR;  Service: Oral Surgery;  Laterality: N/A;     Medication: Current Facility-Administered Medications  Medication Dose Route Frequency Provider Last Rate Last Admin   acetaminophen  (TYLENOL ) tablet 650 mg  650 mg Oral Q6H PRN Amponsah, Prosper M, MD   650 mg at 08/04/24 1042   Or   acetaminophen  (TYLENOL ) suppository 650 mg  650 mg Rectal Q6H PRN Amponsah, Prosper M, MD       bisacodyl  (DULCOLAX) EC tablet 5 mg  5 mg Oral Daily PRN Amponsah, Prosper M, MD       cefTRIAXone  (ROCEPHIN ) 1 g in sodium chloride  0.9 % 100 mL IVPB  1 g Intravenous Q24H Amponsah, Prosper M, MD 200 mL/hr at 08/04/24 1048 1 g at 08/04/24 1048   Chlorhexidine  Gluconate Cloth 2 % PADS 6 each  6 each Topical Daily Dahal, Chapman, MD   6 each at 08/04/24 1048   cyanocobalamin  (VITAMIN B12) tablet 1,000 mcg  1,000 mcg Oral Daily Dahal, Binaya, MD   1,000 mcg at 08/04/24 1041   [START ON 08/05/2024] ferrous sulfate  tablet 325 mg  325 mg Oral Q breakfast Dahal, Chapman, MD       lactated ringers  infusion   Intravenous Continuous Lou Claretta CHRISTELLA, MD   Stopped at 08/04/24 1032   ondansetron  (ZOFRAN ) tablet 4 mg  4 mg Oral Q6H PRN Amponsah, Prosper M, MD       Or   ondansetron  (ZOFRAN ) injection 4 mg  4 mg Intravenous Q6H PRN Amponsah, Prosper M, MD       senna-docusate (Senokot-S) tablet 1 tablet  1 tablet Oral QHS PRN Lou Claretta CHRISTELLA, MD        Allergies: Allergies  Allergen Reactions   Darvocet [Propoxyphene N-Acetaminophen ] Hives and Itching   Tramadol Hives and Itching   Other Hives    Social History: Social History   Tobacco Use   Smoking status: Every Day    Current packs/day: 1.00    Average packs/day: 1 pack/day for 26.0 years (26.0 ttl pk-yrs)    Types: Cigarettes   Smokeless tobacco: Current    Types: Snuff  Vaping Use   Vaping status: Former  Substance Use Topics   Alcohol use: Not Currently    Comment: occasionally   Drug use: Yes    Types: Cocaine, Marijuana    Comment: prior use    Family History History reviewed. No pertinent  family history.  Review of Systems  Genitourinary:  Negative for dysuria, flank pain, frequency, hematuria and urgency.     Objective   Vital signs in last 24 hours: BP (!) 127/58 (BP Location: Left Arm)   Pulse 84   Temp 98.3 F (36.8 C) (Oral)   Resp 18   SpO2 99%   Physical Exam General: A&O, resting, appropriate HEENT: [place]/AT Pulmonary: Normal work of breathing Cardiovascular: no cyanosis Abdomen: Soft, NTTP, nondistended GU: 63f silicone foley catheter in place draining clear yellow urine   Most Recent Labs: Lab Results  Component Value Date   WBC 11.3 (H) 08/04/2024   HGB 7.0 (L) 08/04/2024   HCT 25.2 (L) 08/04/2024   PLT 363 08/04/2024  Lab Results  Component Value Date   NA 140 08/04/2024   K 3.6 08/04/2024   CL 110 08/04/2024   CO2 22 08/04/2024   BUN 17 08/04/2024   CREATININE 1.63 (H) 08/04/2024   CALCIUM  8.2 (L) 08/04/2024   MG 1.7 01/07/2023   PHOS 6.0 (H) 01/31/2023    Lab Results  Component Value Date   INR 1.4 (H) 12/03/2022     Urine Culture: @[id number] (laburin,org,[id number],[id number])@   IMAGING: No results found.  ------  Ole Bourdon, NP Pager: [phone]   Please contact the urology consult pager with any further questions/concerns.

## 2024-08-05 ENCOUNTER — Other Ambulatory Visit (HOSPITAL_COMMUNITY): Payer: Self-pay

## 2024-08-05 ENCOUNTER — Telehealth: Payer: Self-pay

## 2024-08-05 ENCOUNTER — Encounter: Payer: Self-pay | Admitting: Gastroenterology

## 2024-08-05 DIAGNOSIS — R338 Other retention of urine: Secondary | ICD-10-CM | POA: Diagnosis not present

## 2024-08-05 LAB — URINE CULTURE: Culture: <10000 — AB

## 2024-08-05 LAB — MRSA NEXT GEN BY PCR, NASAL: MRSA by PCR Next Gen: NOT DETECTED

## 2024-08-05 MED ORDER — TAMSULOSIN HCL 0.4 MG PO CAPS
0.4000 mg | ORAL_CAPSULE | Freq: Every day | ORAL | 0 refills | Status: AC
  Filled 2024-08-05 (×2): qty 30, 30d supply, fill #0

## 2024-08-05 MED ORDER — FERROUS SULFATE 325 (65 FE) MG PO TABS
325.0000 mg | ORAL_TABLET | Freq: Every day | ORAL | 0 refills | Status: AC
  Filled 2024-08-05: qty 30, 30d supply, fill #0

## 2024-08-05 MED ORDER — CIPROFLOXACIN HCL 500 MG PO TABS
500.0000 mg | ORAL_TABLET | Freq: Two times a day (BID) | ORAL | 0 refills | Status: AC
  Filled 2024-08-05: qty 28, 14d supply, fill #0

## 2024-08-05 MED ORDER — CYANOCOBALAMIN 1000 MCG PO TABS
1000.0000 ug | ORAL_TABLET | Freq: Every day | ORAL | 0 refills | Status: AC
  Filled 2024-08-05: qty 30, 30d supply, fill #0

## 2024-08-05 NOTE — Discharge Summary (Signed)
 Physician Discharge Summary  HELDER CRISAFULLI FMW:[id number] DOB: Oct 22, 1976 DOA: 08/03/2024  PCP: Shona Norleen PEDLAR, MD  Admit date: 08/03/2024 Discharge date: 08/05/2024 Discharging to: home   Discharge Diagnoses:   Principal Problem:   Acute urinary retention Active Problems:   Microcytic anemia   Hydronephrosis   Acute cystitis with hematuria   Bladder outflow obstruction    Brief narrative: Hunter Reyes is a 48 y.o. male with PMH significant for obesity, DM2, HTN, HLD, GERD, neuropathy, bipolar disorder, depression, arthritis, h/o bedbugs with multiple old excoriations and skin, h/o polysubstance abuse, COPD, chronic hep B, right foot osteomyelitis s/p distal foot amputation. In December 2023, patient was hospitalized at [facility] for epidural abscess at L4 level with subacute cauda equina syndrome.  He had urinary retention at the time.   8/11, patient presented to ED at [facility] with complaint of inability to void for 2 days leading to lower abdominal discomfort.  No fever, some burning and pain with urination for few days prior.   Initial labs showed WBC count 14, hemoglobin 7.5, lactic acid 1.6, creatinine 1.65 Urinalysis showed moderate hemoglobinuria, moderate proteinuria, positive nitrate, large leukocyte and many bacteria CT abdomen/pelvis showed -Moderate right hydronephrosis and hydroureter.  No ureteral stone, obstruction may be due to bladder mass near the right UVJ, recently passed stone or non-radiopaque material in the distal right ureter. -Small faintly calcified bladder stones -Thickened urinary bladder wall, asymmetric to the right   Foley catheter was inserted Directly admitted to TRH at [facility] Urology consulted    Assessment and plan: Acute on chronic urinary retention Moderate right hydronephrosis Presented with urinary retention.  Imaging as above showing moderate right hydronephrosis and hydroureter, bladder wall thickening asymmetric to  the right, raising suspicion of non-radiopaque stone versus mass.   Foley catheter inserted Urology consult appreciated.   Given his history of epidural abscess, urology suspects neurogenic bladder.  Recommended Flomax  and to keep catheter in place and plan for outpatient voiding trial and urodynamic study    Acute cystitis Urinalysis showed moderate hemoglobinuria, moderate proteinuria, positive nitrate, large leukocyte and many bacteria Treated with Ceftriaxone - transitioned to Ciprofloxacin  to treat Kliebsiella Oxytoca and Providencia Rettgeri improved from last urine culture from 05/31/2019   Chronic microcytic anemia Severe iron  deficiency Severe vitamin B-12 deficiency Baseline hemoglobin between 7 and 8 Anemia panel showed severely low ferritin at 6 and severe low vitamin B12 at 133.   Given 1 dose of IV Venofer  and 1 dose of IM vitamin B12 & started on oral iron  and oral vitamin B12 supplement  Anxiety and depression Bipolar 1 disorder Patient reports he does not take any medications   Class 2 obesity  Body mass index is 35.3 kg/m. Patient has been advised to make an attempt to improve diet and exercise patterns to aid in weight loss.         Discharge Instructions  Discharge Instructions     Amb Referral to Intravenous Iron  Therapy   Complete by: As directed    You have been referred to [facility] Infusion team for IV Iron  Infusions. The infusion pharmacy team will reach out to you with appointment information.    Primary Diagnosis Code for IV Iron : D50.9 - Iron  deficiency Anemia   Secondary diagnosis code for IV iron : N18.x - CKD   Ambulatory referral to Urology   Complete by: As directed    [facility] follow-up for acute on chronic urinary retention.  Needs voiding trial and probable  urodynamic study.  Difficulty since lumbar spine abscess.      Allergies as of 08/05/2024       Reactions   Darvocet [propoxyphene N-acetaminophen ] Hives, Itching   Ultram  [tramadol] Hives, Itching        Medication List     TAKE these medications    ciprofloxacin  500 MG tablet Commonly known as: Cipro  Take 1 tablet (500 mg total) by mouth 2 (two) times daily for 14 days.   cyanocobalamin  1000 MCG tablet Take 1 tablet (1,000 mcg total) by mouth daily. Start taking on: August 06, 2024   ferrous sulfate  325 (65 FE) MG tablet Take 1 tablet (325 mg total) by mouth daily with breakfast.   omeprazole 20 MG tablet Commonly known as: PRILOSEC OTC Take 20 mg by mouth daily as needed (heartburn).   tamsulosin  0.4 MG Caps capsule Commonly known as: FLOMAX  Take 1 capsule (0.4 mg total) by mouth daily after supper.   TYLENOL  PO Take 500-1,000 mg by mouth 2 (two) times daily as needed (fever, headache, pain). OTC acetaminophen  250mg  tablets.            The results of significant diagnostics from this hospitalization (including imaging, microbiology, ancillary and laboratory) are listed below for reference.    CT RENAL STONE STUDY Result Date: 08/04/2024 CLINICAL DATA:  Bladder neck obstruction, urinary retention status post Foley placement. Burning and pain with urination. EXAM: CT ABDOMEN AND PELVIS WITHOUT CONTRAST TECHNIQUE: Multidetector CT imaging of the abdomen and pelvis was performed following the standard protocol without IV contrast. RADIATION DOSE REDUCTION: This exam was performed according to the departmental dose-optimization program which includes automated exposure control, adjustment of the mA and/or kV according to patient size and/or use of iterative reconstruction technique. COMPARISON:  CT 12/19/2023 FINDINGS: Lower chest: No acute abnormality. Hepatobiliary: Vague focus of hypoattenuation measuring 7 mm in the posterior right hepatic lobe on series 2, image 28. This is poorly evaluated without IV contrast. Gallbladder is unremarkable. No biliary dilation. Pancreas: Unremarkable. Spleen: Unremarkable. Adrenals/Urinary Tract: Unremarkable  adrenal glands. Foley catheter and locules of gas in the thick-walled bladder. Perivesical stranding about the bladder. Mild right hydroureter with trace periureteral stranding. No urinary calculi. Stomach/Bowel: Stomach is within normal limits. Normal caliber large and small bowel without bowel wall thickening. Normal appendix. Vascular/Lymphatic: Aortic atherosclerotic calcification. Bilateral iliac and inguinal lymphadenopathy. This is slightly increased compared to 12/19/2023. For example a 1.6 cm right external iliac node on series 2, image 74 measured 1.0 cm previously. A 1.8 cm left external iliac node on 2/77 previously measured 1.3 cm. Reproductive: No acute abnormality. Other: No abscess.  No free intraperitoneal air. Musculoskeletal: No acute fracture. Ankylosis at L3-L4 compatible with sequela of prior osteomyelitis discitis. IMPRESSION: 1. Cystitis with ascending right urinary tract infection. 2. Foley catheter in the thick-walled bladder. 3. Bilateral iliac and inguinal lymphadenopathy, slightly increased compared to 12/19/2023. This is nonspecific and may be reactive. 4. Vague focus of hypoattenuation measuring 7 mm in the posterior right hepatic lobe. This is poorly evaluated without IV contrast. In the absence of history of malignancy, no follow-up is recommended. 5. Aortic Atherosclerosis (ICD10-I70.0). Electronically Signed   By: Norman Gatlin M.D.   On: 08/04/2024 14:28   Labs:   Basic Metabolic Panel: Recent Labs  Lab 08/04/24 0443  NA 140  K 3.6  CL 110  CO2 22  GLUCOSE 88  BUN 17  CREATININE 1.63*  CALCIUM  8.2*     CBC: Recent Labs  Lab  08/04/24 0443  WBC 11.3*  HGB 7.0*  HCT 25.2*  MCV 72.8*  PLT 363         SIGNED:   True Atlas, MD  [facility] 08/05/2024, 4:05 PM Time taking on discharge: 50 minutes

## 2024-08-05 NOTE — Progress Notes (Signed)
 Discharge medications delivered to patient at bedside [facility]

## 2024-08-05 NOTE — Telephone Encounter (Signed)
 Auth Submission: NO AUTH NEEDED Site of care: Site of care: [facility] Payer: [place] [employer] [place]  Medication & CPT/J Code(s) submitted: Feraheme (ferumoxytol) [id number] Diagnosis Code:  Route of submission (phone, fax, portal): portal Phone # Fax # Auth type: Buy/Bill HB Units/visits requested: 510mg  x 2 doses Reference number:  Approval from: 08/05/24 to 12/23/24

## 2024-08-05 NOTE — Plan of Care (Signed)
   Problem: Clinical Measurements: Goal: Diagnostic test results will improve Outcome: Progressing   Problem: Activity: Goal: Risk for activity intolerance will decrease Outcome: Progressing   Problem: Nutrition: Goal: Adequate nutrition will be maintained Outcome: Progressing

## 2024-08-06 ENCOUNTER — Other Ambulatory Visit (HOSPITAL_COMMUNITY): Payer: Self-pay

## 2024-08-10 ENCOUNTER — Encounter: Payer: Self-pay | Admitting: Gastroenterology

## 2024-08-11 ENCOUNTER — Ambulatory Visit: Admitting: Urology

## 2024-08-11 NOTE — Progress Notes (Deleted)
 Assessment: 1. Urinary retention     Plan: I personally reviewed the patient's chart including provider notes, lab and imaging results. Foley removed today. Continue tamsulosin  Cipro  x 1 following foley removal BMP today Return to office in 10-14 days for bladder scan Will likely need further evaluation with cystoscopy and urodynamics.   Chief Complaint: No chief complaint on file.   History of Present Illness:  Hunter Reyes is a 48 y.o. male who is seen in consultation from True Champagne, MD for evaluation of urinary retention. The patient has a history of bipolar disorder, polysubstance abuse, COPD, chronic hep B, peripheral neuropathy, chronic pain syndrome, and osteomyelitis of the right foot.  He has had issues with urinary retention for the past 2 years.  He presented to [facility] in December 2023 with acute urinary retention.  He was treated for a lumbar epidural abscess with symptoms of cauda equina syndrome during that hospitalization.  He presented to [facility] on 08/03/2024 with lower abdominal pain and difficulty urinating x 36 hours. Creatinine was elevated to 1.65. Urinalysis demonstrated >50 RBCs, >50 WBCs, many bacteria, nitrite positive. Urine culture grew less than 10K colonies. CT abdomen and pelvis from [facility] showed moderate right hydronephrosis and hydroureter, small faintly calcified bladder stones, thickened urinary bladder with asymmetry on the right. Foley catheter was placed and he was started on tamsulosin . Repeat CT study from 08/04/2024 showed perivesical stranding, mild right hydroureter, bilateral iliac and inguinal lymphadenopathy, slightly increased as compared to CT from 12/24.   Past Medical History:  Past Medical History:  Diagnosis Date   Arthritis    hands   Bipolar 1 disorder (HCC)    Borderline personality disorder (HCC)    COPD (chronic obstructive pulmonary disease) (HCC)    denies SOB with daily activities   DDD  (degenerative disc disease), lumbar    and thoracic   Deliberate self-cutting    history of   Depression    Foot ulcer, right (HCC) 11/02/2013   GERD (gastroesophageal reflux disease)    History of seizures    as a child - no seizures since before teenage years; states unknown cause   Neuropathic ulcer (HCC)    Neuropathy    Non-restorable tooth 10/2013   multiple   Obesity    Osteomyelitis (HCC) 2014   right foot   Panic attacks    Peripheral neuropathy     Past Surgical History:  Past Surgical History:  Procedure Laterality Date   AMPUTATION     left middle toe   BREAST BIOPSY Right 07/12/2017   Procedure: BREAST BIOPSY;  Surgeon: Mavis Anes, MD;  Location: [facility];  Service: General;  Laterality: Right;   DECOMPRESSIVE LUMBAR LAMINECTOMY LEVEL 1 N/A 12/05/2022   Procedure: DECOMPRESSIVE LUMBAR LAMINECTOMY LUMBAR FOUR;  Surgeon: Lanis Pupa, MD;  Location: [facility] OR;  Service: Neurosurgery;  Laterality: N/A;   I & D EXTREMITY N/A 12/09/2022   Procedure: IRRIGATION AND DEBRIDEMENT OF FOOT;  Surgeon: Harden Jerona GAILS, MD;  Location: [facility] OR;  Service: Orthopedics;  Laterality: N/A;   INNER EAR SURGERY Left    as a child   IR FLUORO GUIDE CV LINE RIGHT  12/18/2022   IR US  GUIDE VASC ACCESS RIGHT  12/18/2022   MULTIPLE EXTRACTIONS WITH ALVEOLOPLASTY N/A 11/17/2013   Procedure: MULTIPLE EXTRACTION WITH ALVEOLOPLASTY;  Surgeon: Glendia CHRISTELLA Primrose, DDS;  Location: [facility] OR;  Service: Oral Surgery;  Laterality: N/A;    Allergies:  Allergies  Allergen Reactions  Darvocet [Propoxyphene N-Acetaminophen ] Hives and Itching   Ultram [Tramadol] Hives and Itching    Family History:  No family history on file.  Social History:  Social History   Tobacco Use   Smoking status: Every Day    Current packs/day: 1.00    Average packs/day: 1 pack/day for 26.0 years (26.0 ttl pk-yrs)    Types: Cigarettes   Smokeless tobacco: Current    Types: Snuff  Vaping Use   Vaping status: Former   Substance Use Topics   Alcohol use: Not Currently    Comment: occasionally   Drug use: Yes    Types: Cocaine, Marijuana    Comment: prior use    Review of symptoms:  Constitutional:  Negative for unexplained weight loss, night sweats, fever, chills ENT:  Negative for nose bleeds, sinus pain, painful swallowing CV:  Negative for chest pain, shortness of breath, exercise intolerance, palpitations, loss of consciousness Resp:  Negative for cough, wheezing, shortness of breath GI:  Negative for nausea, vomiting, diarrhea, bloody stools GU:  Positives noted in HPI; otherwise negative for gross hematuria, dysuria, urinary incontinence Neuro:  Negative for seizures, poor balance, limb weakness, slurred speech Psych:  Negative for lack of energy, depression, anxiety Endocrine:  Negative for polydipsia, polyuria, symptoms of hypoglycemia (dizziness, hunger, sweating) Hematologic:  Negative for anemia, purpura, petechia, prolonged or excessive bleeding, use of anticoagulants  Allergic:  Negative for difficulty breathing or choking as a result of exposure to anything; no shellfish allergy; no allergic response (rash/itch) to materials, foods  Physical exam: There were no vitals taken for this visit. GENERAL APPEARANCE:  Well appearing, well developed, well nourished, NAD HEENT: Atraumatic, Normocephalic, oropharynx clear. NECK: Supple without lymphadenopathy or thyromegaly. LUNGS: Clear to auscultation bilaterally. HEART: Regular Rate and Rhythm without murmurs, gallops, or rubs. ABDOMEN: Soft, non-tender, No Masses. EXTREMITIES: Moves all extremities well.  Without clubbing, cyanosis, or edema. NEUROLOGIC:  Alert and oriented x 3, normal gait, CN II-XII grossly intact.  MENTAL STATUS:  Appropriate. BACK:  Non-tender to palpation.  No CVAT SKIN:  Warm, dry and intact.    Results: None  Procedure:  VOIDING TRIAL  A voiding trial was performed in the office today.   Volume of sterile  water  instilled: *** mL Foley catheter removed intact. Volume voided by patient: *** mL Post-void residual by bladder scan: *** mL Instructed to return to office if has not voided by 4 PM

## 2024-10-07 ENCOUNTER — Encounter (INDEPENDENT_AMBULATORY_CARE_PROVIDER_SITE_OTHER): Payer: Self-pay | Admitting: Gastroenterology

## 2024-11-16 ENCOUNTER — Telehealth: Payer: Self-pay

## 2024-11-16 NOTE — Telephone Encounter (Signed)
 Pt called in today about an appointment. Pt no showed appointment due to lack of transportation. Referral state's pt decline services, pt is made aware to contact pcp for another referral, pt state's he do not have a pcp but he will reach out to the PA at the hospital he saw recently.

## 2025-01-11 ENCOUNTER — Ambulatory Visit

## 2025-04-02 ENCOUNTER — Ambulatory Visit
